# Patient Record
Sex: Female | Born: 1970 | Race: Black or African American | Hispanic: No | Marital: Single | Smoking: Former smoker
Health system: Southern US, Community
[De-identification: ages and names within clinical notes are randomized; demographics above are authoritative.]

## PROBLEM LIST (undated history)

## (undated) DIAGNOSIS — D219 Benign neoplasm of connective and other soft tissue, unspecified: Secondary | ICD-10-CM

## (undated) DIAGNOSIS — E66813 Obesity, class 3: Secondary | ICD-10-CM

## (undated) DIAGNOSIS — D649 Anemia, unspecified: Secondary | ICD-10-CM

## (undated) DIAGNOSIS — E785 Hyperlipidemia, unspecified: Secondary | ICD-10-CM

## (undated) DIAGNOSIS — K219 Gastro-esophageal reflux disease without esophagitis: Secondary | ICD-10-CM

## (undated) DIAGNOSIS — IMO0001 Reserved for inherently not codable concepts without codable children: Secondary | ICD-10-CM

## (undated) DIAGNOSIS — E119 Type 2 diabetes mellitus without complications: Secondary | ICD-10-CM

## (undated) DIAGNOSIS — J45909 Unspecified asthma, uncomplicated: Secondary | ICD-10-CM

## (undated) DIAGNOSIS — G4730 Sleep apnea, unspecified: Secondary | ICD-10-CM

## (undated) DIAGNOSIS — Z9289 Personal history of other medical treatment: Secondary | ICD-10-CM

## (undated) HISTORY — DX: Benign neoplasm of connective and other soft tissue, unspecified: D21.9

## (undated) HISTORY — DX: Morbid (severe) obesity due to excess calories: E66.01

## (undated) HISTORY — DX: Obesity, class 3: E66.813

## (undated) HISTORY — DX: Hyperlipidemia, unspecified: E78.5

## (undated) HISTORY — DX: Sleep apnea, unspecified: G47.30

## (undated) HISTORY — PX: OVARIAN CYST REMOVAL: SHX89

## (undated) HISTORY — PX: DILATION AND CURETTAGE OF UTERUS: SHX78

---

## 1998-11-12 ENCOUNTER — Emergency Department (HOSPITAL_COMMUNITY): Admission: EM | Admit: 1998-11-12 | Discharge: 1998-11-12 | Payer: Self-pay | Admitting: Emergency Medicine

## 1999-07-02 ENCOUNTER — Encounter: Payer: Self-pay | Admitting: Emergency Medicine

## 1999-07-02 ENCOUNTER — Emergency Department (HOSPITAL_COMMUNITY): Admission: EM | Admit: 1999-07-02 | Discharge: 1999-07-03 | Payer: Self-pay | Admitting: Emergency Medicine

## 1999-07-08 ENCOUNTER — Inpatient Hospital Stay (HOSPITAL_COMMUNITY): Admission: AD | Admit: 1999-07-08 | Discharge: 1999-07-08 | Payer: Self-pay | Admitting: Obstetrics & Gynecology

## 1999-07-08 ENCOUNTER — Encounter: Payer: Self-pay | Admitting: Obstetrics and Gynecology

## 1999-07-10 ENCOUNTER — Inpatient Hospital Stay (HOSPITAL_COMMUNITY): Admission: AD | Admit: 1999-07-10 | Discharge: 1999-07-13 | Payer: Self-pay | Admitting: Obstetrics and Gynecology

## 1999-08-06 ENCOUNTER — Other Ambulatory Visit: Admission: RE | Admit: 1999-08-06 | Discharge: 1999-08-06 | Payer: Self-pay | Admitting: Obstetrics and Gynecology

## 2000-11-23 ENCOUNTER — Emergency Department (HOSPITAL_COMMUNITY): Admission: EM | Admit: 2000-11-23 | Discharge: 2000-11-23 | Payer: Self-pay

## 2001-02-28 ENCOUNTER — Emergency Department (HOSPITAL_COMMUNITY): Admission: EM | Admit: 2001-02-28 | Discharge: 2001-02-28 | Payer: Self-pay | Admitting: Emergency Medicine

## 2005-04-03 ENCOUNTER — Other Ambulatory Visit: Admission: RE | Admit: 2005-04-03 | Discharge: 2005-04-03 | Payer: Self-pay | Admitting: Gynecology

## 2005-04-22 ENCOUNTER — Ambulatory Visit: Admission: RE | Admit: 2005-04-22 | Discharge: 2005-04-22 | Payer: Self-pay | Admitting: Gynecologic Oncology

## 2005-04-30 ENCOUNTER — Ambulatory Visit (HOSPITAL_COMMUNITY): Admission: RE | Admit: 2005-04-30 | Discharge: 2005-04-30 | Payer: Self-pay | Admitting: Gynecologic Oncology

## 2005-06-17 ENCOUNTER — Encounter (INDEPENDENT_AMBULATORY_CARE_PROVIDER_SITE_OTHER): Payer: Self-pay | Admitting: *Deleted

## 2005-06-17 ENCOUNTER — Ambulatory Visit (HOSPITAL_COMMUNITY): Admission: RE | Admit: 2005-06-17 | Discharge: 2005-06-17 | Payer: Self-pay | Admitting: Gynecologic Oncology

## 2005-06-17 ENCOUNTER — Encounter (INDEPENDENT_AMBULATORY_CARE_PROVIDER_SITE_OTHER): Payer: Self-pay | Admitting: Specialist

## 2005-07-08 ENCOUNTER — Ambulatory Visit: Admission: RE | Admit: 2005-07-08 | Discharge: 2005-07-08 | Payer: Self-pay | Admitting: Gynecologic Oncology

## 2009-08-08 ENCOUNTER — Other Ambulatory Visit: Admission: RE | Admit: 2009-08-08 | Discharge: 2009-08-08 | Payer: Self-pay | Admitting: Family Medicine

## 2010-07-19 NOTE — Op Note (Signed)
[facility]  Patient:    Rebecca Johnston, Rebecca Johnston                       MRN: [id number] Proc. Date: 07/11/99 Adm. Date:  [id number] Attending:  Jenean Lindau                           Operative Report  PREOPERATIVE DIAGNOSIS:       Rule out ectopic pregnancy.  POSTOPERATIVE DIAGNOSIS:      Left ectopic pregnancy.  OPERATION:                    Laparoscopy, exploratory laparotomy with left salpingectomy, and lysis of adhesions.  SURGEON:                      Almedia Balls. Fore, M.D.  ASSISTANT:  ANESTHESIA:                   General orotracheal.  ESTIMATED BLOOD LOSS:  INDICATIONS:                  The patient is a 40 year old with pelvic pain and  positive pregnancy test who presented with pain on the evening of May 9 and was  felt to have a ruptured ectopic.  She was fully counseled as to the nature of the procedure involved and the risks involved to include risks of anesthesia, injury to uterus, tubes, ovaries, bowel, bladder, blood vessels, ureters, postoperative hemorrhage, infection, possible removal of tube and ovary, and recuperation. She fully understands all of these considerations and wishes to proceed on Jul 10, 1999.  FINDINGS:                     On laparoscopy there was a large amount of clot present in the pelvis and it was noted that the left tube was quite enlarged. he right tube was adherent into the right posterolateral cul-de-sac area.  There appeared to be hemorrhage of the fimbriated end of the left tube.  The uterus had a large amount of organized clot and fibrin material over the fundus.  DESCRIPTION OF PROCEDURE:     With the patient under general anesthesia, prepped and draped in the usual sterile fashion, with an empty bladder postcatheterization, and a tenaculum and Acorn cannula on the cervix, an incision was made in the lower pole of the umbilicus.  The Veress cannula was inserted into the peritoneal cavity with  insufflation of 3 liters of carbon dioxide.  The reusable trocar for the operative scope and the scope itself were then inserted into the peritoneal cavity. Reusable 5 mm probe was inserted through a stab wound just above the symphysis pubis.  The above noted findings were visualized.  Copious amounts of irrigating solution were used in an attempt to lyse the clots in the pelvis.  However, this was unsuccessful.  Accordingly, it was felt that laparotomy was indicated.  A lower abdominal transverse incision was made and carried into the peritoneal cavity without difficulty.  A self retaining retractor was placed, and the bowel was packed off. Adhesions were lysed between the left tube and bowel, and the left tube and ovary, and the uterus.  The right tube was identified as being quite adherent into the  right posterolateral cul-de-sac area, but without signs of hemorrhage or dilation. The left tube was dilated throughout most of  its length.  The mesosalpinx was sequentially clamped using Heaney clamps, cut, and suture ligated with 0 Vicryl as this was removed.  Proximal portion of the tube was then clamped, cut, and doubly ligated with 0 Vicryl with removal of the majority of the left tube.  The left ovary had small areas of hemorrhage which were rendered hemostatic with interrupted sutures of 0 Vicryl.  The area was then lavaged with copious amounts of Ringers  lactate solution, and after noting that hemostasis was maintained and that sponge, needle, and instrument counts were correct, the peritoneum was closed with a continuous suture of 0 Vicryl.  The fascia layer was closed with two sutures of 0 Vicryl which were brought from the lateral aspects and tied separately in the midline.  Each layer was lavaged with copious amounts of Ringers lactate solution prior to full closure, and each layer was rendered hemostatic with Bovie electrocoagulation.  The skin was closed with skin  clips as was the laparoscopic incision.  Estimated blood loss was 250 ml from surgical standpoint and approximately another 250 to 500 ml of clot within the pelvis.  The patient was  taken to the recovery room in good condition with clear urine in the Foley catheter tubing.  She will be admitted following surgery. DD:  07/11/99 TD:  07/11/99 Job: [id number]

## 2010-07-19 NOTE — Discharge Summary (Signed)
[facility]  Patient:    TENSLEY, WERY                       MRN: [id number] Adm. Date:  [id number] Disc. Date: [id number] Attending:  Jenean Lindau                           Discharge Summary  DISCHARGE DIAGNOSES:          Ruptured ectopic pregnancy, left fallopian tube.  PROCEDURE:                    Diagnostic laparoscopy followed by laparotomy with left salpingectomy and lysis of adhesions.  COMPLICATIONS:                None.  TRANSFUSIONS:                 None.  HISTORY OF PRESENT ILLNESS:   Marcele Kosta is a 40 year old gravida 1, black female with irregular menses who initially presented to the [facility] emergency room with irregular bleeding.  The patient was initially evaluated by  ultrasound and quantitative beta hCG which initially revealed dropping titers from 653 to 593.  At ther subsequent follow-up her beta hCG had stabilized at 581 and this was felt to be consistent with an ectopic pregnancy.  There was no evidence of mass on ultrasound and a small amount of fluid in the pelvis.  Her hemoglobin was 10.4.  She was felt to be hemodynamically stable and a good candidate for medical management.  She was given a methotrexate injection on Jul 08, 1999, with plans o follow up three days later for a follow-up quantitative beta hCG.  However, she  presented on Jul 10, 1999, with acute worsening left lower quadrant pain.  She was felt at that point to have ruptured her pregnancy and was therefore taken to the operating room for evaluation.  HOSPITAL COURSE:              Almedia Balls. Fore, M.D. was covering for this physician for emergencies and took the patient to the operating room where he performed a  laparoscopy, but was unable to evacuate clots sufficiently to proceed with a laparoscopic procedure.  He subsequently performed an exploratory laparotomy with left salpingectomy and lysis of adhesions with  findings of clot adherent to the  left fallopian tube and in the cul-de-sac.  He also reported that the right tube was adherent into the right posterolateral cul-de-sac area, but had no signs of  hemorrhage or dilation.  Postoperatively, the patient did well.  She did have some vaginal bleeding as would be expected, but this was not excessive.  She had a maximum temperature elevation of 99.5 during her postoperative course.  She had return of bowel function and resumed a general diet.  She was ambulating independently, voiding without problems, passing gas, and had moved her bowels at the time of discharge on postoperative day #2.  Pathology confirmed the presence of a pregnancy involving the left fallopian tube.  During her hospital stay, the patient was seen by social work and initiated plans to receive Medicaid retroactive coverage for her pregnancy and related complications.  The patient was discharged home on postoperative day #2 in improved condition. She was given routine written and verbal instructions.  She was told to take Tylenol as opposed to Advil for minor degrees of pain due  to her recent methotrexate injection. She was given a prescription for Tylox dispense 20 one to two q.4-6h. p.r.n. pain with no refills.  Her blood type was O positive and she therefore did not require RhoGAM.  She was to follow up in the office the following Tuesday for staple removal and incision evaluation.  The incision appeared to be healing well at the time of discharge.  CONDITION ON DISCHARGE:       Improved. DD:  08/14/99 TD:  08/17/99 Job: [id number]

## 2010-07-19 NOTE — Op Note (Signed)
Rebecca Johnston, Rebecca Johnston                ACCOUNT NO.:  [id number]   MEDICAL RECORD NO.:  [id number]          PATIENT TYPE:  OUT   LOCATION:  GYN                          FACILITY:  [facility]   PHYSICIAN:  Rebecca A. Duard Brady, MD    DATE OF BIRTH:  12-Sep-1970   DATE OF PROCEDURE:  04/22/2005  DATE OF DISCHARGE:                                 OPERATIVE REPORT   The patient is seen, today, in consultation at the request of Dr. Chevis Pretty. Ms.  Johnston is a 40 year old gravida 1 para 0, abortus 1 who began experiencing  some right sided abdominal pain for the past 1-2 weeks. She was seen by Dr.  Chevis Pretty on February12 and apparently a palpable pelvic mass was present. An  ultrasound was performed of which I only have a written, brief note, in the  right ovary that revealed a 9.6 x 7.1 cm mass. A CA-125 was drawn that was  elevated at 63.3. She is also referred to [place] for evaluation. She does have a  history of an ectopic pregnancy in 2001 at which time she underwent a  laparoscopy, exploratory laparotomy with left salpingectomy and lysis of  adhesions in May2001 by Dr. Randell Patient.   The findings, at that time, revealed the right tube to be adherent into the  right posterior lateral cul-de-sac, but with no signs of hemorrhage or  dilatation. The left tube was dilated throughout most of its length.  She,  otherwise, has no complaints.  Her cycle was regular.  Her last cycle was at  the end of January. She denies any change in bowel or bladder habits.   PAST MEDICAL HISTORY:  None.   PAST SURGICAL HISTORY:  Ectopic pregnancy.   SOCIAL HISTORY:  She is single. She smokes 5-10 cigarettes per day; she has  done for 15 years. She denies the use of alcohol. She is a health care  Rebecca Johnston.   MEDICINES:  None.   ALLERGIES:  None.   FAMILY HISTORY:  Mother with hypertension, sister with diabetes, paternal  grandmother with cancer of some type, paternal grandfather with  hypertension, paternal grandmother with cancer of  unknown type, paternal  grandfather with cancer of unknown type.  Her father has diabetes.   PHYSICAL EXAMINATION:  VITAL SIGNS:  Height 5 feet 5 inches.  Weight 272  pounds. Pulse 88, respirations 18.  GENERAL:  A well-nourished, well-developed female in no acute distress.  NECK:  Supple. There is no lymphadenopathy. No thyromegaly.  LUNGS:  Clear to auscultation with distant breath sounds.  CARDIOVASCULAR EXAM:  Regular rate and rhythm.  ABDOMEN:  Morbidly obese, soft, nontender, nondistended. There are no  palpable masses or hepatosplenomegaly.  Exam is limited by habitus. She has  an infraumbilical incision and a transverse skin incision. Groins were  negative for adenopathy.  EXTREMITIES:  There is no edema.  PELVIC:  On bimanual examination in the posterior vagina, there is a mass  palpable that is more palpable on vaginal examination than on abdominal  secondary to obesity. On rectovaginal examination on the right side. I could  move it  out of the pelvis.   ASSESSMENT:  A 40 year old with a pelvic mass on ultrasound. We only have a  handwritten note regarding the results.  We also have an elevated CA-125.  This could all be consistent with endometriosis considering her prior  adhesive disease noted in 2001.   PLAN:  I discussed this issue with the patient.  I think identifying her  anatomy as well as can be expected would be essential as she already has the  loss of one tube, she had never had the opportunity to have children. We  will, therefore, proceed with a CT scan of the abdomen and pelvis. Once  these results are available I will contact the patient and will determine  her disposition.      Rebecca A. Duard Brady, MD  Electronically Signed     PAG/MEDQ  D:  04/22/2005  T:  04/23/2005  Job:  [id number]   cc:   Rebecca Johnston, R.N.  [address]  [place], [place] [address]   Rebecca Johnston. Mezer, M.D.  Fax: [phone]

## 2010-07-19 NOTE — Op Note (Signed)
Rebecca Johnston, CLIETT                ACCOUNT NO.:  [id number]   MEDICAL RECORD NO.:  [id number]          PATIENT TYPE:  AMB   LOCATION:  DAY                          FACILITY:  [facility]   PHYSICIAN:  Paola A. Duard Brady, MD    DATE OF BIRTH:  1970/10/03   DATE OF PROCEDURE:  06/17/2005  DATE OF DISCHARGE:                                 OPERATIVE REPORT   PREOPERATIVE DIAGNOSIS:  9 cm simple of right ovarian mass.   POSTOPERATIVE DIAGNOSIS:  Right-sided peritoneal inclusion cyst, right  hydrosalpinx.   PROCEDURE:  Diagnostic laparoscopy with cyst drainage.   SURGEON:  Paola A. Duard Brady, M.D.   ASSISTANT:  Roseanna [place], M.D.   ANESTHESIA:  General.   IV FLUIDS:  2700 mL.   URINE OUTPUT:  275 mL.   ESTIMATED BLOOD LOSS:  Minimal, less than 50 mL.   SPECIMENS:  Pelvic washings, cyst fluid.   SPECIMENS:  To pathology.   COMPLICATIONS:  None.   OPERATIVE FINDINGS:  Omentum adherent to the anterior abdominal wall status  post prior laparoscopy, the surgically absent left fallopian tube, a 4 cm  myoma fungal, a 9 cm right-sided peritoneal inclusion cyst densely adherent  to the ovary with hydrosalpinx of the right fallopian tube.  Approximately  150 mL of clear fluid were drained.   DESCRIPTION OF PROCEDURE:  The patient was taken to the operating room and  placed in supine position where anesthesia was induced.  She was then placed  in the dorsal lithotomy position with all appropriate precautions.  The arms  were then tucked to her side, again, with all appropriate precautions and  shoulder blocks were applied appropriately.  The abdomen was prepped in  usual sterile fashion.  The perineum was prepped in usual sterile fashion.  A Foley catheter was inserted in the bladder without difficulty.  The vagina  was cleansed.  A speculum was placed in the vagina.  The cervix is  nulliparous, it was clean, there were no visible lesions.  A single tooth  tenaculum was placed on the  anterior lip the cervix.  The cervix sounded to  9 cm and easily dilated.  The Humi was then placed into the cervix and  endocervical canal without any difficulty.   She was then draped in the usual sterile fashion.  Time-out was performed.  The patient was noted to have a large pannus with the umbilicus  approximately two fingerbreadth above the pubic symphysis.  Therefore, the  decision was made to proceed with a supraumbilical incision.  4% Marcaine  with epinephrine was injected.  A 2 cm horizontal incision was made 6-7 cm  above the umbilicus with the knife and carried down to the fascia using Mayo  scissors.  The fascia was identified, grasped with Kocher clamps, tented,  and entered sharply.  The fascial edges were secured with 0 Vicryl on a UR6.  The peritoneal cavity was then entered without difficulty.  The Hasson was  then placed, the abdomen was insufflated with CO2.  Never did the patient's  pressure exceed 50 mmHg.  The findings  as above were noted.  The patient was  placed in deep Trendelenburg position.  Attention was then given to placing  lateral ports.  Two 5 mm incisions were placed laterally approximately 10-15  cm away from the umbilicus in the same plane as the umbilicus.  Intraperitoneal placement was confirmed with lidocaine.  5 mm skin incision  was made with a knife and 5 mm trocars were placed under direct  visualization.  The omental adhesions were taken down using monopolar  cautery.  A 10/12 suprapubic port was then placed under her pannus.  The  area was infiltrated with lidocaine.  A 1.5 cm incision was made with a  knife and the 10/12 port was placed without difficulty.   Findings as above were noted.  An incision was made in what was initially  felt to be the ovarian capsule and fluid, as mentioned above, was drained  and aspirated.  Dissection of the capsule then revealed that we were  retroperitoneal, the retroperitoneal structures were identified.   The ovary  was densely adherent on the inferomedial side of this with the fallopian  tube draped over it and completely clubbed and obstructed, there were no  identifiable fimbria.  This was clearly a peritoneal inclusion cyst.  The  retroperitoneum was then dissected and the ureter was identified and noted  to be free from the area of dissection.  Indigo carmine was given with no  extravasation.  The area was irrigated.  It was noted to be hemostatic.  A 4  cm window was made within the peritoneal inclusion cyst to attempt  prevention of re-formation.  The ovary was left intact.  The area was  inspected under low flow and there was noted to be no bleeding.  The ports  were removed under direct visualization.  The trocars were all removed.  The  supraumbilical port fascia was closed using a running 0 Vicryl on a UR6.  The subcu tissue was noted be hemostatic and the skin was closed using 4-0  Vicryl with Steri-Strips.  The suprapubic port skin sites were closed using  4-0 Vicryl.  Steri-Strips and a dressing were placed.  Steri-Strips were  placed on the lateral dressings.  The patient tolerated procedure well and  was taken to the recovery room in stable condition.  All instrument, lap,  Ray-Tec, and needle counts were correct x2.  Specimens were sent to  pathology.      Paola A. Duard Brady, MD  Electronically Signed     PAG/MEDQ  D:  06/17/2005  T:  06/17/2005  Job:  [id number]   cc:   Roseanna Rainbow, M.D.  Fax: [phone]   Telford Nab, R.N.  [address]  [place], [place] [address]

## 2010-07-19 NOTE — H&P (Signed)
[place]  Patient:    Rebecca Johnston, Rebecca Johnston                       MRN: [id number] Adm. Date:  [id number] Attending:  Jenean Lindau                         History and Physical  CHIEF COMPLAINT:  Pain, positive pregnancy test.  HISTORY:  The patient is a 40 year old G0 whose last menstrual period was some ime in early April according to the patient.  She states she began having pain over the weekend of 07/02/99 and presented at [facility] emergency room, at which time  evaluation was performed, to include a positive pregnancy test and ultrasound which showed a large amount of echogenic fluid in the cul-de-sac and in the area of the right adnexa.  The patient was evaluated for possible ectopic pregnancy and allowed to return home.  On approximately 07/08/99, she was given an injection of methotrexate with the thought that because of plateauing HCG levels, she possibly had an ectopic pregnancy.  Her pain worsened over the afternoon of 07/10/99 and she presented n the emergency room at approximately 10:15 on the evening of 07/10/99. Evaluation at this time revealed the patient in moderate distress, complaining of pain in he left lower quadrant.  She is admitted for laparoscopy and possible laparotomy at this time for probable ectopic, which has ruptured.  PAST MEDICAL HISTORY:  She has no serious history of illness.  MEDICATIONS:  She is taking no medications.  ALLERGIES:  She is allergic to no medications.  REVIEW OF SYSTEMS:  HEENT within normal limits.  Cardiorespiratory: Negative. Gastrointestinal: Negative.  Genitourinary: As in Present Illness. Neuromuscular: Negative.  PHYSICAL EXAMINATION:  VITAL SIGNS:  Blood pressure 100/54, pulse 101, temperature 99.1, respirations 0.  GENERAL:  Well-developed, obese black female in moderate distress.  HEENT:  Within normal limits.  NECK:  Supple, without masses or  adenopathy.  LUNGS:  Clear to P&A.  HEART:  Regular rate and rhythm without murmurs.  BREASTS:  Exam while sitting/lying - without masses.  ABDOMEN:  Quite obese but very tender in both lower quadrants, right greater than left.  PELVIC:  External genitalia, Bartholins, urethral, Skenes glands within normal limits.  Cervix is high in the vagina and somewhat inflamed.  The uterus is difficult to palpate, but very tender on manipulation.  Adnexal masses cannot be palpated as well, but tenderness is greater on the right than the left.  EXTREMITIES:  Within normal limits.  CNS:  Grossly intact.  SKIN:  Without suspicious lesions.  IMPRESSION:  Pelvic pain, positive pregnancy test; rule out ectopic pregnancy.  DISPOSITION:  As noted above. DD:  07/11/99 TD:  07/11/99 Job: [id number]

## 2010-07-19 NOTE — Consult Note (Signed)
[name], [name]                ACCOUNT NO.:  [id number]   MEDICAL RECORD NO.:  [id number]          PATIENT TYPE:  OUT   LOCATION:  GYN                          FACILITY[facility]  [facility]   PHYSICIAN:  Paola A. Duard Brady, MD    DATE OF BIRTH:  19-Jun-1970   DATE OF CONSULTATION:  07/08/2005  DATE OF DISCHARGE:                                   CONSULTATION   Rebecca Johnston is a very pleasant 40 year old gravida 1, para 0, abortus 1 who  began experiencing some right-sided abdominal pain in February 2007.  She  was seen by Dr. Chevis Pretty and there was a palpable mass.  Ultrasound was  performed that revealed a 9.6 x 7.1 cm mass and she had an elevated CA-125  at 63.3.  She was subsequently referred to [place] for evaluation.  She did have  a history of an ectopic pregnancy in 2001 at which time she underwent  exploratory laparoscopy, laparotomy with left salpingectomy and extensive  lysis of adhesions.  A repeat ultrasound performed showed persistence of  this lesion.  Subsequently on April 1978 she underwent diagnostic  laparoscopy and cyst drainage.  Operative findings included omentum adherent  to the anterior abdominal wall status post a prior laparoscopy.  There is a  surgically absent left fallopian tube. She had a 4 cm small fundal myoma, a  9 cm right side peritoneal inclusion cyst that was densely adherent to the  right ovary with a hydrosalpinx of the right fallopian tube.  At the time of  surgery a window was made in what appeared to be a perineal inclusion cyst  and the cyst was drained. Peritoneal washings were negative and the ovarian  cyst fluid revealed abundant lymphocytes.  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  She comes in today for her postoperative check.  She is  overall doing quite well.  She continues have a small amount of  supraumbilical port site tenderness but otherwise doing well.  She has some  small amount of constipation.  She has not taken any stool softeners.  She  denies any nausea, vomiting, fevers, chills.  She tolerates regular diet and  is emptying her bowel and bladder habits fine.  The below back pain that she  was having has resolved.   PHYSICAL EXAMINATION:  GENERAL:  Weight 270 pounds, well-nourished alert  female in no acute distress.  ABDOMEN:  She has well healing surgical  incisions.  She has a scab over her supra umbilical port site, it is well-  healed.  There is no surrounding erythema. Abdomen is soft and nontender.   ASSESSMENT:  49.  40 year old status post cyst drainage and window creation for benign      disease.  She does have significant adhesive disease and a hydrosalpinx      on her right tube.  I did explain to her that should she decide to      proceed with conception she will most likely need in vitro fertilization  or at least some assistance by reproductive endocrinology she      understands this.  Also discussed with her that her ovaries looked      unremarkable but she had dense adhesive disease.  Her questions      regarding this were answered.  I do feel she can return to work on May      21.  2.  I have encouraged her to use Mederma or cocoa butter on her incision for      scar purposes.  3.  She will return to the care of Dr. Chevis Pretty.  She knows we will be happy to      see her in the future should the need arise.      Paola A. Duard Brady, MD  Electronically Signed     PAG/MEDQ  D:  07/08/2005  T:  07/09/2005  Job:  [id number]   cc:   Leatha Gilding. Mezer, M.D.  Fax: [phone]   Telford Nab, R.N.  [address]  [place], [place] [address]   Roseanna Rainbow, M.D.  Fax: [phone]

## 2010-08-06 ENCOUNTER — Other Ambulatory Visit: Payer: Self-pay | Admitting: Family Medicine

## 2010-08-06 DIAGNOSIS — M25572 Pain in left ankle and joints of left foot: Secondary | ICD-10-CM

## 2010-08-12 ENCOUNTER — Other Ambulatory Visit (HOSPITAL_COMMUNITY): Payer: Self-pay

## 2010-08-12 ENCOUNTER — Inpatient Hospital Stay (HOSPITAL_COMMUNITY): Admission: RE | Admit: 2010-08-12 | Payer: Self-pay | Source: Ambulatory Visit

## 2010-08-19 ENCOUNTER — Ambulatory Visit (HOSPITAL_COMMUNITY)
Admission: RE | Admit: 2010-08-19 | Discharge: 2010-08-19 | Disposition: A | Discharge status: Home / Self Care | Payer: No Typology Code available for payment source | Source: Ambulatory Visit | Attending: Family Medicine | Admitting: Family Medicine

## 2010-08-19 DIAGNOSIS — M899 Disorder of bone, unspecified: Secondary | ICD-10-CM | POA: Insufficient documentation

## 2010-08-19 DIAGNOSIS — M25579 Pain in unspecified ankle and joints of unspecified foot: Secondary | ICD-10-CM | POA: Insufficient documentation

## 2010-08-19 DIAGNOSIS — M25572 Pain in left ankle and joints of left foot: Secondary | ICD-10-CM

## 2011-12-15 ENCOUNTER — Other Ambulatory Visit (HOSPITAL_COMMUNITY): Payer: Self-pay | Admitting: Physician Assistant

## 2011-12-15 DIAGNOSIS — Z1231 Encounter for screening mammogram for malignant neoplasm of breast: Secondary | ICD-10-CM

## 2011-12-26 ENCOUNTER — Ambulatory Visit (HOSPITAL_COMMUNITY)
Admission: RE | Admit: 2011-12-26 | Discharge: 2011-12-26 | Disposition: A | Discharge status: Home / Self Care | Payer: Self-pay | Source: Ambulatory Visit | Attending: Physician Assistant | Admitting: Physician Assistant

## 2011-12-26 DIAGNOSIS — Z1231 Encounter for screening mammogram for malignant neoplasm of breast: Secondary | ICD-10-CM

## 2013-03-07 ENCOUNTER — Other Ambulatory Visit (HOSPITAL_COMMUNITY): Payer: Self-pay | Admitting: Physician Assistant

## 2013-03-30 ENCOUNTER — Other Ambulatory Visit: Payer: Self-pay | Admitting: Obstetrics and Gynecology

## 2013-03-30 DIAGNOSIS — Z1231 Encounter for screening mammogram for malignant neoplasm of breast: Secondary | ICD-10-CM

## 2013-04-03 DIAGNOSIS — Z9289 Personal history of other medical treatment: Secondary | ICD-10-CM

## 2013-04-03 HISTORY — DX: Personal history of other medical treatment: Z92.89

## 2013-04-18 ENCOUNTER — Emergency Department (HOSPITAL_COMMUNITY)
Admission: EM | Admit: 2013-04-18 | Discharge: 2013-04-18 | Disposition: A | Discharge status: Home / Self Care | Payer: Medicaid Other | Attending: Emergency Medicine | Admitting: Emergency Medicine

## 2013-04-18 ENCOUNTER — Emergency Department (HOSPITAL_COMMUNITY): Payer: Medicaid Other

## 2013-04-18 ENCOUNTER — Encounter (HOSPITAL_COMMUNITY): Payer: Self-pay | Admitting: Emergency Medicine

## 2013-04-18 DIAGNOSIS — F17200 Nicotine dependence, unspecified, uncomplicated: Secondary | ICD-10-CM | POA: Insufficient documentation

## 2013-04-18 DIAGNOSIS — R071 Chest pain on breathing: Secondary | ICD-10-CM | POA: Insufficient documentation

## 2013-04-18 DIAGNOSIS — D649 Anemia, unspecified: Secondary | ICD-10-CM

## 2013-04-18 DIAGNOSIS — R0602 Shortness of breath: Secondary | ICD-10-CM | POA: Insufficient documentation

## 2013-04-18 DIAGNOSIS — R0781 Pleurodynia: Secondary | ICD-10-CM

## 2013-04-18 DIAGNOSIS — N920 Excessive and frequent menstruation with regular cycle: Secondary | ICD-10-CM | POA: Insufficient documentation

## 2013-04-18 LAB — CBC WITH DIFFERENTIAL/PLATELET
BASOS ABS: 0 10*3/uL (ref 0.0–0.1)
Basophils Relative: 0 % (ref 0–1)
Eosinophils Absolute: 0.2 10*3/uL (ref 0.0–0.7)
Eosinophils Relative: 2 % (ref 0–5)
HCT: 25.7 % — ABNORMAL LOW (ref 36.0–46.0)
Hemoglobin: 7.7 g/dL — ABNORMAL LOW (ref 12.0–15.0)
LYMPHS ABS: 2.2 10*3/uL (ref 0.7–4.0)
LYMPHS PCT: 18 % (ref 12–46)
MCH: 16.3 pg — AB (ref 26.0–34.0)
MCHC: 30 g/dL (ref 30.0–36.0)
MCV: 54.6 fL — ABNORMAL LOW (ref 78.0–100.0)
MONO ABS: 1.2 10*3/uL — AB (ref 0.1–1.0)
Monocytes Relative: 10 % (ref 3–12)
Neutro Abs: 8.8 10*3/uL — ABNORMAL HIGH (ref 1.7–7.7)
Neutrophils Relative %: 70 % (ref 43–77)
PLATELETS: 552 10*3/uL — AB (ref 150–400)
RBC: 4.71 MIL/uL (ref 3.87–5.11)
RDW: 21 % — AB (ref 11.5–15.5)
WBC: 12.4 10*3/uL — AB (ref 4.0–10.5)

## 2013-04-18 LAB — COMPREHENSIVE METABOLIC PANEL
ALK PHOS: 141 U/L — AB (ref 39–117)
ALT: 14 U/L (ref 0–35)
AST: 27 U/L (ref 0–37)
Albumin: 3.2 g/dL — ABNORMAL LOW (ref 3.5–5.2)
BUN: 8 mg/dL (ref 6–23)
CO2: 23 mEq/L (ref 19–32)
Calcium: 9.5 mg/dL (ref 8.4–10.5)
Chloride: 101 mEq/L (ref 96–112)
Creatinine, Ser: 0.87 mg/dL (ref 0.50–1.10)
GFR, EST NON AFRICAN AMERICAN: 80 mL/min — AB (ref >90)
GLUCOSE: 107 mg/dL — AB (ref 70–99)
Potassium: 4.4 mEq/L (ref 3.7–5.3)
Sodium: 138 mEq/L (ref 137–147)
Total Bilirubin: 0.2 mg/dL — ABNORMAL LOW (ref 0.3–1.2)
Total Protein: 7.8 g/dL (ref 6.0–8.3)

## 2013-04-18 LAB — HCG, SERUM, QUALITATIVE: PREG SERUM: NEGATIVE

## 2013-04-18 LAB — VITAMIN B12: Vitamin B-12: 516 pg/mL (ref 211–911)

## 2013-04-18 LAB — RETICULOCYTES
RBC.: 4.74 MIL/uL (ref 3.87–5.11)
RETIC COUNT ABSOLUTE: 90.1 10*3/uL (ref 19.0–186.0)
RETIC CT PCT: 1.9 % (ref 0.4–3.1)

## 2013-04-18 LAB — IRON AND TIBC
Iron: 16 ug/dL — ABNORMAL LOW (ref 42–135)
Saturation Ratios: 4 % — ABNORMAL LOW (ref 20–55)
TIBC: 443 ug/dL (ref 250–470)
UIBC: 427 ug/dL — AB (ref 125–400)

## 2013-04-18 LAB — FERRITIN: Ferritin: 6 ng/mL — ABNORMAL LOW (ref 10–291)

## 2013-04-18 LAB — PREPARE RBC (CROSSMATCH)

## 2013-04-18 LAB — TROPONIN I

## 2013-04-18 LAB — D-DIMER, QUANTITATIVE (NOT AT ARMC): D DIMER QUANT: 1.83 ug{FEU}/mL — AB (ref 0.00–0.48)

## 2013-04-18 LAB — FOLATE: Folate: 6.4 ng/mL

## 2013-04-18 MED ORDER — OMEPRAZOLE 20 MG PO CPDR: 20.0000 mg | DELAYED_RELEASE_CAPSULE | Freq: Every day | ORAL | Status: DC

## 2013-04-18 MED ORDER — DOCUSATE SODIUM 100 MG PO CAPS: 100.0000 mg | ORAL_CAPSULE | Freq: Two times a day (BID) | ORAL | Status: DC

## 2013-04-18 MED ORDER — FERROUS SULFATE 325 (65 FE) MG PO TABS: 325.0000 mg | ORAL_TABLET | Freq: Every day | ORAL | Status: DC

## 2013-04-18 MED ORDER — KETOROLAC TROMETHAMINE 15 MG/ML IJ SOLN
30.0000 mg | Freq: Once | INTRAMUSCULAR | Status: AC
  Administered 2013-04-18: 30 mg via INTRAVENOUS
  Filled 2013-04-18: qty 1

## 2013-04-18 MED ORDER — IOHEXOL 350 MG/ML SOLN
100.0000 mL | Freq: Once | INTRAVENOUS | Status: AC | PRN
  Administered 2013-04-18: 100 mL via INTRAVENOUS

## 2013-04-18 NOTE — Progress Notes (Signed)
Pt states she previously seen at [facility] physicians on [address] States she recently obtained her new insurance coverage and will get the time to review her list of in network providers soon and will probably choose a new provider later after ED visit. Cm attempted to answer pt's questions about pcp Pt refused offer for Cm to assist with a list of in network pcps for [place].

## 2013-04-18 NOTE — Discharge Instructions (Signed)
Anemia, Nonspecific Anemia is a condition in which the concentration of red blood cells or hemoglobin in the blood is below normal. Hemoglobin is a substance in red blood cells that carries oxygen to the tissues of the body. Anemia results in not enough oxygen reaching these tissues.  CAUSES  Common causes of anemia include:   Excessive bleeding. Bleeding may be internal or external. This includes excessive bleeding from periods (in women) or from the intestine.   Poor nutrition.   Chronic kidney, thyroid, and liver disease.  Bone marrow disorders that decrease red blood cell production.  Cancer and treatments for cancer.  HIV, AIDS, and their treatments.  Spleen problems that increase red blood cell destruction.  Blood disorders.  Excess destruction of red blood cells due to infection, medicines, and autoimmune disorders. SIGNS AND SYMPTOMS   Minor weakness.   Dizziness.   Headache.  Palpitations.   Shortness of breath, especially with exercise.   Paleness.  Cold sensitivity.  Indigestion.  Nausea.  Difficulty sleeping.  Difficulty concentrating. Symptoms may occur suddenly or they may develop slowly.  DIAGNOSIS  Additional blood tests are often needed. These help your health care provider determine the best treatment. Your health care provider will check your stool for blood and look for other causes of blood loss.  TREATMENT  Treatment varies depending on the cause of the anemia. Treatment can include:   Supplements of iron, vitamin P53, or folic acid.   Hormone medicines.   A blood transfusion. This may be needed if blood loss is severe.   Hospitalization. This may be needed if there is significant continual blood loss.   Dietary changes.  Spleen removal. HOME CARE INSTRUCTIONS Keep all follow-up appointments. It often takes many weeks to correct anemia, and having your health care provider check on your condition and your response to  treatment is very important. SEEK IMMEDIATE MEDICAL CARE IF:   You develop extreme weakness, shortness of breath, or chest pain.   You become dizzy or have trouble concentrating.  You develop heavy vaginal bleeding.   You develop a rash.   You have bloody or black, tarry stools.   You faint.   You vomit up blood.   You vomit repeatedly.   You have abdominal pain.  You have a fever or persistent symptoms for more than 2 3 days.   You have a fever and your symptoms suddenly get worse.   You are dehydrated.  MAKE SURE YOU:  Understand these instructions.  Will watch your condition.  Will get help right away if you are not doing well or get worse. Document Released: 03/27/2004 Document Revised: 10/20/2012 Document Reviewed: 08/13/2012 [facility] Patient Information 2014 [place].  Chest Pain (Nonspecific) It is often hard to give a specific diagnosis for the cause of chest pain. There is always a chance that your pain could be related to something serious, such as a heart attack or a blood clot in the lungs. You need to follow up with your caregiver for further evaluation. CAUSES   Heartburn.  Pneumonia or bronchitis.  Anxiety or stress.  Inflammation around your heart (pericarditis) or lung (pleuritis or pleurisy).  A blood clot in the lung.  A collapsed lung (pneumothorax). It can develop suddenly on its own (spontaneous pneumothorax) or from injury (trauma) to the chest.  Shingles infection (herpes zoster virus). The chest wall is composed of bones, muscles, and cartilage. Any of these can be the source of the pain.  The bones can  be bruised by injury.  The muscles or cartilage can be strained by coughing or overwork.  The cartilage can be affected by inflammation and become sore (costochondritis). DIAGNOSIS  Lab tests or other studies, such as X-rays, electrocardiography, stress testing, or cardiac imaging, may be needed to find the cause of  your pain.  TREATMENT   Treatment depends on what may be causing your chest pain. Treatment may include:  Acid blockers for heartburn.  Anti-inflammatory medicine.  Pain medicine for inflammatory conditions.  Antibiotics if an infection is present.  You may be advised to change lifestyle habits. This includes stopping smoking and avoiding alcohol, caffeine, and chocolate.  You may be advised to keep your head raised (elevated) when sleeping. This reduces the chance of acid going backward from your stomach into your esophagus.  Most of the time, nonspecific chest pain will improve within 2 to 3 days with rest and mild pain medicine. HOME CARE INSTRUCTIONS   If antibiotics were prescribed, take your antibiotics as directed. Finish them even if you start to feel better.  For the next few days, avoid physical activities that bring on chest pain. Continue physical activities as directed.  Do not smoke.  Avoid drinking alcohol.  Only take over-the-counter or prescription medicine for pain, discomfort, or fever as directed by your caregiver.  Follow your caregiver's suggestions for further testing if your chest pain does not go away.  Keep any follow-up appointments you made. If you do not go to an appointment, you could develop lasting (chronic) problems with pain. If there is any problem keeping an appointment, you must call to reschedule. SEEK MEDICAL CARE IF:   You think you are having problems from the medicine you are taking. Read your medicine instructions carefully.  Your chest pain does not go away, even after treatment.  You develop a rash with blisters on your chest. SEEK IMMEDIATE MEDICAL CARE IF:   You have increased chest pain or pain that spreads to your arm, neck, jaw, back, or abdomen.  You develop shortness of breath, an increasing cough, or you are coughing up blood.  You have severe back or abdominal pain, feel nauseous, or vomit.  You develop severe  weakness, fainting, or chills.  You have a fever. THIS IS AN EMERGENCY. Do not wait to see if the pain will go away. Get medical help at once. Call your local emergency services (911 in [place] Do not drive yourself to the hospital. MAKE SURE YOU:   Understand these instructions.  Will watch your condition.  Will get help right away if you are not doing well or get worse. Document Released: 11/27/2004 Document Revised: 05/12/2011 Document Reviewed: 09/23/2007 [facility] Patient Information 2014 [place].

## 2013-04-18 NOTE — ED Provider Notes (Addendum)
Spoke with Dr Benay Spice, heme onc, who agrees with workup. Suspects this is more related to iron deficiency anemia.  He feels as though the primary care physician can work up will refer to hematology as necessary.  He recommends that the patient has difficulty getting in to the primary care physician that his team will be happy to see her in the heme onc clinic. Anemia panel now. Transfusion now. Dc home after transfusion  1:47 PM Patient continues to feel good.  Discharge home after blood transfusion.  Patient states she does not have a primary care physician at this time.  We a long discussion regarding primary care physicians and she understands the importance of followup.  She has health insurance that she just received through the [employer] healthcare act.  She states this was started several weeks.  She will develop a relationship with her PCP.  The meantime she'll followup with the hematologist as necessary but it seems like this is likely iron deficiency anemia.  Patient be placed on iron supplementation.  Her chest pain is nonspecific.  She'll be placed on Prilosec.  Doubt ACS.  No evidence of PE.  She understands the importance of close followup and repeat imaging of her chest to evaluate her pulmonary nodules.  Results for orders placed during the hospital encounter of 04/18/13  CBC WITH DIFFERENTIAL      Result Value Ref Range   WBC 12.4 (*) 4.0 - 10.5 K/uL   RBC 4.71  3.87 - 5.11 MIL/uL   Hemoglobin 7.7 (*) 12.0 - 15.0 g/dL   HCT 25.7 (*) 36.0 - 46.0 %   MCV 54.6 (*) 78.0 - 100.0 fL   MCH 16.3 (*) 26.0 - 34.0 pg   MCHC 30.0  30.0 - 36.0 g/dL   RDW 21.0 (*) 11.5 - 15.5 %   Platelets 552 (*) 150 - 400 K/uL   Neutrophils Relative % 70  43 - 77 %   Lymphocytes Relative 18  12 - 46 %   Monocytes Relative 10  3 - 12 %   Eosinophils Relative 2  0 - 5 %   Basophils Relative 0  0 - 1 %   Neutro Abs 8.8 (*) 1.7 - 7.7 K/uL   Lymphs Abs 2.2  0.7 - 4.0 K/uL   Monocytes Absolute 1.2 (*) 0.1 -  1.0 K/uL   Eosinophils Absolute 0.2  0.0 - 0.7 K/uL   Basophils Absolute 0.0  0.0 - 0.1 K/uL   RBC Morphology POLYCHROMASIA PRESENT    COMPREHENSIVE METABOLIC PANEL      Result Value Ref Range   Sodium 138  137 - 147 mEq/L   Potassium 4.4  3.7 - 5.3 mEq/L   Chloride 101  96 - 112 mEq/L   CO2 23  19 - 32 mEq/L   Glucose, Bld 107 (*) 70 - 99 mg/dL   BUN 8  6 - 23 mg/dL   Creatinine, Ser 0.87  0.50 - 1.10 mg/dL   Calcium 9.5  8.4 - 10.5 mg/dL   Total Protein 7.8  6.0 - 8.3 g/dL   Albumin 3.2 (*) 3.5 - 5.2 g/dL   AST 27  0 - 37 U/L   ALT 14  0 - 35 U/L   Alkaline Phosphatase 141 (*) 39 - 117 U/L   Total Bilirubin 0.2 (*) 0.3 - 1.2 mg/dL   GFR calc non Af Amer 80 (*) >90 mL/min   GFR calc Af Amer >90  >90 mL/min  TROPONIN I  Result Value Ref Range   Troponin I <0.30  <0.30 ng/mL  D-DIMER, QUANTITATIVE      Result Value Ref Range   D-Dimer, Quant 1.83 (*) 0.00 - 0.48 ug/mL-FEU  HCG, SERUM, QUALITATIVE      Result Value Ref Range   Preg, Serum NEGATIVE  NEGATIVE  VITAMIN B12      Result Value Ref Range   Vitamin B-12 516  211 - 911 pg/mL  FOLATE      Result Value Ref Range   Folate 6.4    IRON AND TIBC      Result Value Ref Range   Iron 16 (*) 42 - 135 ug/dL   TIBC 443  250 - 470 ug/dL   Saturation Ratios 4 (*) 20 - 55 %   UIBC 427 (*) 125 - 400 ug/dL  FERRITIN      Result Value Ref Range   Ferritin 6 (*) 10 - 291 ng/mL  RETICULOCYTES      Result Value Ref Range   Retic Ct Pct 1.9  0.4 - 3.1 %   RBC. 4.74  3.87 - 5.11 MIL/uL   Retic Count, Manual 90.1  19.0 - 186.0 K/uL  PREPARE RBC (CROSSMATCH)      Result Value Ref Range   Order Confirmation ORDER PROCESSED BY BLOOD BANK    TYPE AND SCREEN      Result Value Ref Range   ABO/RH(D) O POS     Antibody Screen NEG     Sample Expiration 04/21/2013     Unit Number [id number]     Blood Component Type RBC, LR IRR     Unit division 00     Status of Unit ISSUED     Transfusion Status OK TO TRANSFUSE      Crossmatch Result Compatible     Unit Number [id number]     Blood Component Type RED CELLS,LR     Unit division 00     Status of Unit ISSUED     Transfusion Status OK TO TRANSFUSE     Crossmatch Result Compatible      Hoy Morn, MD 04/18/13 [place], MD 04/18/13 1348

## 2013-04-18 NOTE — ED Provider Notes (Signed)
CSN: [id number]     Arrival date & time 04/18/13  0501 History   First MD Initiated Contact with Patient 04/18/13 0505     Chief Complaint  Patient presents with  . Chest Pain     (Consider location/radiation/quality/duration/timing/severity/associated sxs/prior Treatment) HPI Patient presents with 2 days of central and right lower chest pain. Patient states the pain is sharp in nature and worse with inspiration and expiration. The pain waxes and wanes though the pain has not dissipated since its onset. She has no cough or fever. She has mild shortness of breath associated with deep inspiration. Chest no lower extremity swelling or pain. Patient denies any extended travel or surgeries. She's had no sick contacts. She denies abdominal pain or nausea. History reviewed. No pertinent past medical history. History reviewed. No pertinent past surgical history. No family history on file. History  Substance Use Topics  . Smoking status: Never Smoker   . Smokeless tobacco: Not on file  . Alcohol Use: No   OB History   Grav Para Term Preterm Abortions TAB SAB Ect Mult Living                 Review of Systems  Constitutional: Negative for fever and chills.  HENT: Negative for congestion, rhinorrhea and sinus pressure.   Respiratory: Positive for shortness of breath. Negative for cough and wheezing.   Cardiovascular: Positive for chest pain.  Gastrointestinal: Negative for nausea, vomiting, abdominal pain and diarrhea.  Musculoskeletal: Negative for back pain, myalgias, neck pain and neck stiffness.  Skin: Negative for rash and wound.  Neurological: Negative for dizziness, weakness, light-headedness, numbness and headaches.  All other systems reviewed and are negative.      Allergies  Review of patient's allergies indicates no known allergies.  Home Medications  No current outpatient prescriptions on file. BP 110/50  Pulse 85  Temp(Src) 98.1 F (36.7 C) (Oral)  Resp 26  SpO2  100%  LMP 03/10/2013 Physical Exam  Nursing note and vitals reviewed. Constitutional: She is oriented to person, place, and time. She appears well-developed and well-nourished. No distress.  HENT:  Head: Normocephalic and atraumatic.  Mouth/Throat: Oropharynx is clear and moist.  Eyes: EOM are normal. Pupils are equal, round, and reactive to light.  Neck: Normal range of motion. Neck supple.  Cardiovascular: Normal rate and regular rhythm.   Pulmonary/Chest: Effort normal and breath sounds normal. No respiratory distress. She has no wheezes. She has no rales. She exhibits no tenderness (no chest tenderness to palpation. No crepitance.).  Abdominal: Soft. Bowel sounds are normal. She exhibits no distension and no mass. There is no tenderness. There is no rebound and no guarding.  Musculoskeletal: Normal range of motion. She exhibits no edema and no tenderness.  No calf swelling or tenderness.  Neurological: She is alert and oriented to person, place, and time.  Patient is alert and oriented x3 with clear, goal oriented speech. Patient has 5/5 motor in all extremities. Sensation is intact to light touch. Patient has a normal gait and walks without assistance.   Skin: Skin is warm and dry. No rash noted. No erythema.  Psychiatric: She has a normal mood and affect. Her behavior is normal.    ED Course  Procedures (including critical care time) Labs Review Labs Reviewed  CBC WITH DIFFERENTIAL - Abnormal; Notable for the following:    WBC 12.4 (*)    Hemoglobin 7.7 (*)    HCT 25.7 (*)    MCV 54.6 (*)  MCH 16.3 (*)    RDW 21.0 (*)    Platelets 552 (*)    Neutro Abs 8.8 (*)    Monocytes Absolute 1.2 (*)    All other components within normal limits  COMPREHENSIVE METABOLIC PANEL - Abnormal; Notable for the following:    Glucose, Bld 107 (*)    Albumin 3.2 (*)    Alkaline Phosphatase 141 (*)    Total Bilirubin 0.2 (*)    GFR calc non Af Amer 80 (*)    All other components within  normal limits  D-DIMER, QUANTITATIVE - Abnormal; Notable for the following:    D-Dimer, Quant 1.83 (*)    All other components within normal limits  TROPONIN I  HCG, SERUM, QUALITATIVE  PREPARE RBC (CROSSMATCH)   Imaging Review Dg Chest 2 View  04/18/2013   CLINICAL DATA:  Chest pain  EXAM: CHEST  2 VIEW  COMPARISON:  None currently available  FINDINGS: Generous heart size. No acute mediastinal contour change. No edema, consolidation, effusion, or pneumothorax. No acute osseous findings.  IMPRESSION: No active cardiopulmonary disease.   Electronically Signed   By: Jorje Guild M.D.   On: 04/18/2013 07:29   Ct Angio Chest Pe W/cm &/or Wo Cm  04/18/2013   CLINICAL DATA:  Mid sternal chest pain, elevated D-dimer  EXAM: CT ANGIOGRAPHY CHEST WITH CONTRAST  TECHNIQUE: Multidetector CT imaging of the chest was performed using the standard protocol during bolus administration of intravenous contrast. Multiplanar CT image reconstructions and MIPs were obtained to evaluate the vascular anatomy.  CONTRAST:  134mL OMNIPAQUE IOHEXOL 350 MG/ML SOLN  COMPARISON:  None.  FINDINGS: No filling defects in the pulmonary arterial system, although detail is mildly limited by patient body habitus particularly involving the more peripheral pulmonary arteries. Thoracic aorta shows no dissection or dilatation. In the retrosternal area there is some ill-defined soft tissue which may represent residual thymus. There is a left suprahilar lymph node measuring 10 mm in short axis. There is an 8 mm lymph node in the anterior mediastinum adjacent to the root of the aorta. There is a 7 mm right paratracheal lymph node. There is an 11 mm right hilar lymph node.  There is no infiltrate, consolidation, or effusion. On image number 24 series 6, there is a 4 mm pulmonary nodule superior segment left lower lobe. On image 29 there is a 6 mm left pulmonary nodule. On image number 33 there is a 8 mm left lower lobe pulmonary nodule. There is  a 5 mm left lower lobe pulmonary nodule on image number 43. There is a 5 mm pulmonary nodule in the right lower lobe on image number 54.  There are no acute musculoskeletal findings. The upper abdomen is essentially excluded from the field-of-view except for the domes of the liver and spleen which appear negative.  Review of the MIP images confirms the above findings.  IMPRESSION: 1. Study limited by body habitus with no pulmonary embolism detected. 2. Multiple borderline and a few mildly enlarged nonspecific mediastinal and hilar lymph nodes. Multiple pulmonary nodules bilaterally the largest measuring 8 mm. If the patient is at high risk for bronchogenic carcinoma, follow-up chest CT at 3-21months is recommended. If the patient is at low risk for bronchogenic carcinoma, follow-up chest CT at 6-12 months is recommended. This recommendation follows the consensus statement: Guidelines for Management of Small Pulmonary Nodules Detected on CT Scans: A Statement from the [place] as published in Radiology 2005; 237:395-400.   Electronically Signed  By: Skipper Cliche M.D.   On: 04/18/2013 07:22    EKG Interpretation    Date/Time:  Monday April 18 2013 05:13:28 EST Ventricular Rate:  97 PR Interval:  124 QRS Duration: 77 QT Interval:  360 QTC Calculation: 457 R Axis:   59 Text Interpretation:  Sinus rhythm Confirmed by Kasai Beltran  MD, Avenir Lozinski ([id number]) on 04/18/2013 5:34:02 AM            MDM   Final diagnoses:  Pleuritic chest pain      Patient states that she's been told that she is anemic in the past but never required blood transfusion. She's had increasing shortness of breath especially with walking up stairs at the last few weeks. She reports heavy irregular periods. She denies any gastrointestinal bleeding or melena. Her chest pain is improved in the emergency department. Her vital signs remained stable. Patient does admit to smoking half-pack a day. Her CT scan shows multiple  nodules in the lungs. Had a long discussion with patient regarding this finding. Have encouraged her to stop smoking. Patient has primary care Dr. and she's been advised she is to followup with her primary care doctor in 3-6 months for repeat CT scan. Will transfuse this patient in emergency department given her symptomatic anemia and have her followup with her primary. Return precautions have been given and the patient's voice understanding. I have low suspicion for coronary artery disease in the pleuritic nature of the pain. The patient has a normal EKG and has had pain that is constant for the past 2 days with a normal troponin. Will sign out to oncoming emergency physician pending completion of blood transfusion. The patient can be safely discharged home.  Julianne Rice, MD 04/18/13 [phone]

## 2013-04-18 NOTE — ED Notes (Signed)
Second unit blood verified by Kerin Perna, RN at [id number] and ended at 1420. Patient's bracelet and blood was scanned. At the beginning.

## 2013-04-18 NOTE — ED Notes (Signed)
Pt denies itching, discomfort, or shortness of breath/ difficulty breathing.

## 2013-04-18 NOTE — ED Notes (Signed)
Pt arrived to the ED with a complaint of chest pain.  Pain is located mid sternum.  Pt states's she has no radiation with pain.  Pt states that the pain increases with her exhalations.  Pt states she has no cough, congestion, headache or nausea and emesis with the chest pain.  Pt is ambulatory and able to communicate symptoms while walking.

## 2013-04-19 LAB — TYPE AND SCREEN
ABO/RH(D): O POS
Antibody Screen: NEGATIVE
UNIT DIVISION: 0
Unit division: 0

## 2013-04-27 ENCOUNTER — Encounter (HOSPITAL_COMMUNITY): Payer: Self-pay | Admitting: Emergency Medicine

## 2013-04-27 ENCOUNTER — Emergency Department (HOSPITAL_COMMUNITY)
Admission: EM | Admit: 2013-04-27 | Discharge: 2013-04-27 | Disposition: A | Discharge status: Home / Self Care | Payer: Medicaid Other | Attending: Emergency Medicine | Admitting: Emergency Medicine

## 2013-04-27 ENCOUNTER — Emergency Department (HOSPITAL_COMMUNITY): Payer: Medicaid Other

## 2013-04-27 DIAGNOSIS — E669 Obesity, unspecified: Secondary | ICD-10-CM | POA: Insufficient documentation

## 2013-04-27 DIAGNOSIS — F17200 Nicotine dependence, unspecified, uncomplicated: Secondary | ICD-10-CM | POA: Insufficient documentation

## 2013-04-27 DIAGNOSIS — R0789 Other chest pain: Secondary | ICD-10-CM | POA: Insufficient documentation

## 2013-04-27 DIAGNOSIS — Z79899 Other long term (current) drug therapy: Secondary | ICD-10-CM | POA: Insufficient documentation

## 2013-04-27 LAB — I-STAT TROPONIN, ED: Troponin i, poc: 0 ng/mL (ref 0.00–0.08)

## 2013-04-27 LAB — PRO B NATRIURETIC PEPTIDE: Pro B Natriuretic peptide (BNP): 12.1 pg/mL (ref 0–125)

## 2013-04-27 LAB — BASIC METABOLIC PANEL
BUN: 10 mg/dL (ref 6–23)
CALCIUM: 9.7 mg/dL (ref 8.4–10.5)
CO2: 24 mEq/L (ref 19–32)
Chloride: 97 mEq/L (ref 96–112)
Creatinine, Ser: 0.9 mg/dL (ref 0.50–1.10)
GFR calc Af Amer: 89 mL/min — ABNORMAL LOW (ref >90)
GFR, EST NON AFRICAN AMERICAN: 77 mL/min — AB (ref >90)
Glucose, Bld: 139 mg/dL — ABNORMAL HIGH (ref 70–99)
Potassium: 4 mEq/L (ref 3.7–5.3)
Sodium: 135 mEq/L — ABNORMAL LOW (ref 137–147)

## 2013-04-27 LAB — CBC WITH DIFFERENTIAL/PLATELET
BASOS ABS: 0 10*3/uL (ref 0.0–0.1)
BASOS PCT: 0 % (ref 0–1)
EOS ABS: 0.2 10*3/uL (ref 0.0–0.7)
Eosinophils Relative: 2 % (ref 0–5)
HEMATOCRIT: 33.2 % — AB (ref 36.0–46.0)
HEMOGLOBIN: 10.1 g/dL — AB (ref 12.0–15.0)
LYMPHS ABS: 1.6 10*3/uL (ref 0.7–4.0)
Lymphocytes Relative: 17 % (ref 12–46)
MCH: 18.2 pg — AB (ref 26.0–34.0)
MCHC: 30.4 g/dL (ref 30.0–36.0)
MCV: 59.8 fL — AB (ref 78.0–100.0)
MONO ABS: 0.5 10*3/uL (ref 0.1–1.0)
Monocytes Relative: 5 % (ref 3–12)
Neutro Abs: 7.3 10*3/uL (ref 1.7–7.7)
Neutrophils Relative %: 76 % (ref 43–77)
Platelets: 476 10*3/uL — ABNORMAL HIGH (ref 150–400)
RBC: 5.55 MIL/uL — ABNORMAL HIGH (ref 3.87–5.11)
RDW: 27.1 % — AB (ref 11.5–15.5)
WBC: 9.6 10*3/uL (ref 4.0–10.5)

## 2013-04-27 MED ORDER — KETOROLAC TROMETHAMINE 60 MG/2ML IM SOLN
60.0000 mg | Freq: Once | INTRAMUSCULAR | Status: AC
  Administered 2013-04-27: 60 mg via INTRAMUSCULAR
  Filled 2013-04-27: qty 2

## 2013-04-27 NOTE — ED Notes (Signed)
Pt was seen here on 2/16 for chest pain.  Pt was admitted to hospital for anemia and given blood transfusion.  Pt states she has had a menstrual cycle and would like her blood levels rechecked.  Pt then goes on to explain that she is having the same chest pain as before.

## 2013-04-27 NOTE — ED Notes (Signed)
She also mentions having "heavy periods; and I'm on it now".  She states "The doctor may need to refer me to see someone about fibroids and birth control and that sort of thing".

## 2013-04-27 NOTE — ED Notes (Signed)
She states she was here a short time ago and was found to be anemic and at that time received two units of prbc's. She is in no distress and c/o left thoracic area pain which she theorizes is pleurisy.  She also states on her previous visit here "the doctor gave me something through my IV for "inflammation in my chest".

## 2013-04-27 NOTE — ED Provider Notes (Signed)
CSN: [id number]     Arrival date & time 04/27/13  1106 History   First MD Initiated Contact with Patient 04/27/13 1452     Chief Complaint  Patient presents with  . Pleurisy     (Consider location/radiation/quality/duration/timing/severity/associated sxs/prior Treatment) Patient is a 43 y.o. female presenting with chest pain. The history is provided by the patient. No language interpreter was used.  Chest Pain Pain location:  L chest Pain quality: sharp   Radiates to: to L lateral chest. Pain radiates to the back: no   Pain severity:  Moderate Duration:  2 days Timing:  Intermittent Progression:  Waxing and waning Chronicity:  Recurrent Context: breathing   Relieved by:  Nothing Worsened by:  Deep breathing Ineffective treatments:  None tried Associated symptoms: no abdominal pain, no back pain, no claudication, no cough, no diaphoresis, no fatigue, no fever, no headache, no nausea, no numbness, no palpitations, no shortness of breath, no syncope, not vomiting and no weakness   Risk factors: obesity and smoking   Risk factors: no aortic disease, no birth control, no coronary artery disease, no Ehlers-Danlos syndrome, no high cholesterol, no hypertension, no immobilization, not female, no Marfan's syndrome, not pregnant, no prior DVT/PE and no surgery     History reviewed. No pertinent past medical history. History reviewed. No pertinent past surgical history. History reviewed. No pertinent family history. History  Substance Use Topics  . Smoking status: Never Smoker   . Smokeless tobacco: Not on file  . Alcohol Use: No   OB History   Grav Para Term Preterm Abortions TAB SAB Ect Mult Living                 Review of Systems  Constitutional: Negative for fever, chills, diaphoresis, activity change, appetite change and fatigue.  HENT: Negative for congestion, facial swelling, rhinorrhea and sore throat.   Eyes: Negative for photophobia and discharge.  Respiratory: Negative  for cough, chest tightness and shortness of breath.   Cardiovascular: Positive for chest pain. Negative for palpitations, claudication, leg swelling and syncope.  Gastrointestinal: Negative for nausea, vomiting, abdominal pain and diarrhea.  Endocrine: Negative for polydipsia and polyuria.  Genitourinary: Negative for dysuria, frequency, difficulty urinating and pelvic pain.  Musculoskeletal: Negative for arthralgias, back pain, neck pain and neck stiffness.  Skin: Negative for color change and wound.  Allergic/Immunologic: Negative for immunocompromised state.  Neurological: Negative for facial asymmetry, weakness, numbness and headaches.  Hematological: Does not bruise/bleed easily.  Psychiatric/Behavioral: Negative for confusion and agitation.      Allergies  Review of patient's allergies indicates no known allergies.  Home Medications   Current Outpatient Rx  Name  Route  Sig  Dispense  Refill  . cholecalciferol (VITAMIN D) 1000 UNITS tablet   Oral   Take 1,000 Units by mouth daily.         . ferrous sulfate 325 (65 FE) MG tablet   Oral   Take 1 tablet (325 mg total) by mouth daily.   30 tablet   0   . ibuprofen (ADVIL,MOTRIN) 200 MG tablet   Oral   Take 600 mg by mouth every 6 (six) hours as needed for fever, headache or moderate pain.         Marland Kitchen omeprazole (PRILOSEC) 20 MG capsule   Oral   Take 1 capsule (20 mg total) by mouth daily.   30 capsule   0    BP 129/77  Pulse 88  Temp(Src) 98 F (36.7 C) (Oral)  Resp 16  SpO2 99%  LMP 04/21/2013 Physical Exam  Constitutional: She is oriented to person, place, and time. She appears well-developed and well-nourished. No distress.  HENT:  Head: Normocephalic and atraumatic.  Mouth/Throat: No oropharyngeal exudate.  Eyes: Pupils are equal, round, and reactive to light.  Neck: Normal range of motion. Neck supple.  Cardiovascular: Normal rate, regular rhythm and normal heart sounds.  Exam reveals no gallop and no  friction rub.   No murmur heard. Pulmonary/Chest: Effort normal and breath sounds normal. No respiratory distress. She has no wheezes. She has no rales. She exhibits tenderness and bony tenderness.    Abdominal: Soft. Bowel sounds are normal. She exhibits no distension and no mass. There is no tenderness. There is no rebound and no guarding.  Musculoskeletal: Normal range of motion. She exhibits no edema and no tenderness.  Neurological: She is alert and oriented to person, place, and time.  Skin: Skin is warm and dry.  Psychiatric: She has a normal mood and affect.    ED Course  Procedures (including critical care time) Labs Review Labs Reviewed  CBC WITH DIFFERENTIAL - Abnormal; Notable for the following:    RBC 5.55 (*)    Hemoglobin 10.1 (*)    HCT 33.2 (*)    MCV 59.8 (*)    MCH 18.2 (*)    RDW 27.1 (*)    Platelets 476 (*)    All other components within normal limits  BASIC METABOLIC PANEL - Abnormal; Notable for the following:    Sodium 135 (*)    Glucose, Bld 139 (*)    GFR calc non Af Amer 77 (*)    GFR calc Af Amer 89 (*)    All other components within normal limits  PRO B NATRIURETIC PEPTIDE  I-STAT TROPOININ, ED   Imaging Review Dg Chest 2 View  04/27/2013   CLINICAL DATA:  Chest pain  EXAM: CHEST  2 VIEW  COMPARISON:  DG CHEST 2 VIEW dated 04/18/2013  FINDINGS: Normal heart size. Under aerated lungs. Grossly clear. No pneumothorax. No pleural effusion. Bronchitic changes are likely chronic.  IMPRESSION: No active cardiopulmonary disease.   Electronically Signed   By: Maryclare Bean M.D.   On: 04/27/2013 15:32    EKG Interpretation   None       MDM   Final diagnoses:  Atypical chest pain    Pt is a 43 y.o. female with Pmhx as above who presents with 2 days of L sided, sharp, pleuritic CP.  Pt states she had similar presentation on 2/16, had Hb 7.7 and was transfused 2U PRBCs. She has found to have multiple borderline, some enlarged mediastinal lymph nodes, but  no PE. Trop not elevated.  On PE, VSS, pt in NAD.  Chest pain reproducible.  Cardiopulm exam otherwise benign. No LE ttp or edema. No risk factors for ACS or PE other than smoking. EKG w/o acute ischemic changes. Trop neg, BNP not elevated. CXR clear.  Hb improved today to 10.1. Suspect MSK pain or costochondritis.  She has PCP appt on Friday which she is encouraged to keep.  Return precautions given for new or worsening symptoms including worsening pain, trouble breathing, fever, leg swelling.             Neta Ehlers, MD 04/27/13 [phone]

## 2013-04-27 NOTE — Discharge Instructions (Signed)
Chest Pain (Nonspecific) °Chest pain has many causes. Your pain could be caused by something serious, such as a heart attack or a blood clot in the lungs. It could also be caused by something less serious, such as a chest bruise or a virus. Follow up with your doctor. More lab tests or other studies may be needed to find the cause of your pain. Most of the time, nonspecific chest pain will improve within 2 to 3 days of rest and mild pain medicine. °HOME CARE °· For chest bruises, you may put ice on the sore area for 15-20 minutes, 03-04 times a day. Do this only if it makes you feel better. °· Put ice in a plastic bag. °· Place a towel between the skin and the bag. °· Rest for the next 2 to 3 days. °· Go back to work if the pain improves. °· See your doctor if the pain lasts longer than 1 to 2 weeks. °· Only take medicine as told by your doctor. °· Quit smoking if you smoke. °GET HELP RIGHT AWAY IF:  °· There is more pain or pain that spreads to the arm, neck, jaw, back, or belly (abdomen). °· You have shortness of breath. °· You cough more than usual or cough up blood. °· You have very bad back or belly pain, feel sick to your stomach (nauseous), or throw up (vomit). °· You have very bad weakness. °· You pass out (faint). °· You have a fever. °Any of these problems may be serious and may be an emergency. Do not wait to see if the problems will go away. Get medical help right away. Call your local emergency services 911 in [place] Do not drive yourself to the hospital. °MAKE SURE YOU:  °· Understand these instructions. °· Will watch this condition. °· Will get help right away if you or your child is not doing well or gets worse. °Document Released: 08/06/2007 Document Revised: 05/12/2011 Document Reviewed: 08/06/2007 °ExitCare® Patient Information ©2014 [employer]. ° °

## 2013-05-03 ENCOUNTER — Ambulatory Visit (HOSPITAL_COMMUNITY)
Admission: RE | Admit: 2013-05-03 | Discharge: 2013-05-03 | Disposition: A | Discharge status: Home / Self Care | Payer: Medicaid Other | Source: Ambulatory Visit | Attending: Obstetrics and Gynecology | Admitting: Obstetrics and Gynecology

## 2013-05-03 ENCOUNTER — Ambulatory Visit (HOSPITAL_COMMUNITY)
Admission: RE | Admit: 2013-05-03 | Discharge: 2013-05-03 | Disposition: A | Discharge status: Home / Self Care | Payer: No Typology Code available for payment source | Source: Ambulatory Visit | Attending: Obstetrics and Gynecology | Admitting: Obstetrics and Gynecology

## 2013-05-03 ENCOUNTER — Encounter (HOSPITAL_COMMUNITY): Payer: Self-pay

## 2013-05-03 VITALS — BP 116/78 | Temp 98.9°F | Ht 66.0 in | Wt 273.6 lb

## 2013-05-03 DIAGNOSIS — Z1231 Encounter for screening mammogram for malignant neoplasm of breast: Secondary | ICD-10-CM | POA: Insufficient documentation

## 2013-05-03 DIAGNOSIS — Z01419 Encounter for gynecological examination (general) (routine) without abnormal findings: Secondary | ICD-10-CM

## 2013-05-03 HISTORY — DX: Anemia, unspecified: D64.9

## 2013-05-03 NOTE — Progress Notes (Signed)
No complaints today.  Pap Smear:    Pap smear completed today. Patients last Pap smear was 08/08/2009 and normal. Per patient has a history of an abnormal Pap smear in 2007 that required a repeat Pap smear for follow-up that was normal. Per patient has had at least three normal Pap smears since abnormal Pap smear in 2007. Last Pap smear result is in EPIC.  Physical exam: Breasts Breasts symmetrical. No skin abnormalities bilateral breasts. No nipple retraction bilateral breasts. No nipple discharge bilateral breasts. No lymphadenopathy. No lumps palpated bilateral breasts. No complaints of pain or tenderness on exam. Patient escorted to mammography for a screening mammogram.          Pelvic/Bimanual   Ext Genitalia No lesions, no swelling and no discharge observed on external genitalia.         Vagina Vagina pink and normal texture. No lesions or discharge observed in vagina.          Cervix Cervix is present. Cervix pink and of normal texture. Small amount of thick white discharge observed on cervical os.     Uterus Uterus is present and palpable. Uterus in normal position and normal size.        Adnexae Bilateral ovaries present and palpable. No tenderness on palpation.          Rectovaginal No rectal exam completed today since patient had no rectal complaints. No skin abnormalities observed on exam.

## 2013-05-03 NOTE — Patient Instructions (Signed)
Taught Rebecca Johnston how to perform BSE and gave educational materials to take home. Let her know [facility] will cover Pap smears every 3 years unless has a history of abnormal Pap smears. Let patient know will follow up with her within the next couple weeks with results by letter and/or phone. [place] verbalized understanding. Patient escorted to mammography for a screening mammogram.  Kayanna Mckillop, Arvil Chaco, RN 9:28 AM

## 2013-05-06 ENCOUNTER — Ambulatory Visit (HOSPITAL_BASED_OUTPATIENT_CLINIC_OR_DEPARTMENT_OTHER): Payer: Medicaid Other

## 2013-05-06 ENCOUNTER — Other Ambulatory Visit: Payer: No Typology Code available for payment source

## 2013-05-06 VITALS — BP 130/90 | HR 98 | Temp 98.7°F | Resp 22 | Ht 63.75 in | Wt 277.1 lb

## 2013-05-06 DIAGNOSIS — Z0000 Encounter for general adult medical examination without abnormal findings: Secondary | ICD-10-CM

## 2013-05-06 LAB — HEMOGLOBIN A1C
HEMOGLOBIN A1C: 5.9 % — AB (ref <5.7)
MEAN PLASMA GLUCOSE: 123 mg/dL — AB (ref <117)

## 2013-05-06 LAB — GLUCOSE (CC13): Glucose: 79 mg/dl (ref 70–140)

## 2013-05-06 LAB — LIPID PANEL
CHOL/HDL RATIO: 4.1 ratio
Cholesterol: 145 mg/dL (ref 0–200)
HDL: 35 mg/dL — AB (ref >39)
LDL Cholesterol: 90 mg/dL (ref 0–99)
Triglycerides: 98 mg/dL (ref <150)
VLDL: 20 mg/dL (ref 0–40)

## 2013-05-06 NOTE — Progress Notes (Signed)
Patient is a new patient to the [facility] program and is currently a [facility] patient effective 05/03/2013.   Clinical Measurements: Patient is 5 ft. 3.75 inches, weight 277.1 lbs, waist circumference 50.5 inches, and hip circumference 52.5inches.   Medical History: Patient has no history of high cholesterol. Patient does not have a history of hypertension or diabetes. Per patient no diagnosed history of coronary heart disease, heart attack, heart failure, stroke/TIA, vascular disease or congenital heart defects. Patient states that has been experiencing chest pain for the past 22 days. The pain is more on inspiration. Patient reported that all test related to heart or possible pulmonary emboli have been negative.  Blood Pressure, Self-measurement: Patient states has no reason to check Blood pressure.  Nutrition Assessment: Patient stated that eats 1 fruit every day. Patient states she eats one to 2 servings of vegetables a day.. She does eat 3 or more ounces of whole grains daily. Patient doesn't eat two or more servings of fish weekly. Patient states she does drink more than 36 ounces or 450 calories of beverages with added sugars weekly.Patient stated she does watch her salt intake.   Physical Activity Assessment: Patient stated she does 630 minutes of exercise weekly including walking and household chores. Patient does not do any vigorous activity during the week.  Smoking Status: Patient has never smoked and is not in closed areas with smokers.  Quality of Life Assessment: In assessing patient's quality of life she stated that out of the past 30 days that she has felt her health is good all of them. Patient also stated that in the past 30 days that her mental health is not good including stress, depression and problems with emotions for 3 days. Patient did state that out of the past 30 days she felt her physical or mental health had not kept her from doing her usual activities including self-care,  work or recreation.   Plan: Lab work will be done today including a lipid panel, blood glucose, and Hgb A1C. Will call lab results when they are finished. Patient will returning for Health Coaching for nutrition. Will work on behavior modifications that we discussed to lower BP. Will obtain appointment at [facility] for HTN.

## 2013-05-09 NOTE — Patient Instructions (Signed)
Discussed health assessment with patient. Will refer patient to [facility] for high blood pressure. Let patient know that will call her to follow up and inform her about labs, health Coaching and doctors appointment.. Instructed patient about  decreasing stress, salt intake, and other behavior modifications. Discussed the different kinds of nutritional information and plans that are available for her use. Patient verbalized understanding.

## 2013-05-11 ENCOUNTER — Telehealth (HOSPITAL_COMMUNITY): Payer: Self-pay | Admitting: *Deleted

## 2013-05-11 NOTE — Telephone Encounter (Signed)
Telephoned patient at home # and left message to return call to [facility] 

## 2013-05-18 ENCOUNTER — Telehealth: Payer: Self-pay

## 2013-05-18 NOTE — Telephone Encounter (Signed)
Called to inform patient of lab results and ofappointment at [facility] on March 25th at 9 am. Patient stated that would be unable to go at that time because of school. I gave her the telephone number to clinic to change her appointment.

## 2013-05-25 ENCOUNTER — Ambulatory Visit: Payer: Self-pay | Admitting: Internal Medicine

## 2013-05-30 ENCOUNTER — Encounter: Payer: Self-pay | Admitting: Internal Medicine

## 2013-05-30 ENCOUNTER — Ambulatory Visit: Payer: Medicaid Other | Attending: Internal Medicine | Admitting: Internal Medicine

## 2013-05-30 VITALS — BP 117/81 | HR 96 | Temp 98.0°F | Ht 67.0 in | Wt 265.0 lb

## 2013-05-30 DIAGNOSIS — R911 Solitary pulmonary nodule: Secondary | ICD-10-CM | POA: Insufficient documentation

## 2013-05-30 DIAGNOSIS — D62 Acute posthemorrhagic anemia: Secondary | ICD-10-CM | POA: Insufficient documentation

## 2013-05-30 LAB — COMPLETE METABOLIC PANEL WITH GFR
ALT: 16 U/L (ref 0–35)
AST: 23 U/L (ref 0–37)
Albumin: 3.8 g/dL (ref 3.5–5.2)
Alkaline Phosphatase: 134 U/L — ABNORMAL HIGH (ref 39–117)
BUN: 12 mg/dL (ref 6–23)
CALCIUM: 9.5 mg/dL (ref 8.4–10.5)
CO2: 25 meq/L (ref 19–32)
Chloride: 100 mEq/L (ref 96–112)
Creat: 0.93 mg/dL (ref 0.50–1.10)
GFR, EST AFRICAN AMERICAN: 87 mL/min
GFR, EST NON AFRICAN AMERICAN: 76 mL/min
GLUCOSE: 112 mg/dL — AB (ref 70–99)
POTASSIUM: 4.3 meq/L (ref 3.5–5.3)
SODIUM: 135 meq/L (ref 135–145)
TOTAL PROTEIN: 8.2 g/dL (ref 6.0–8.3)
Total Bilirubin: 0.4 mg/dL (ref 0.2–1.2)

## 2013-05-30 LAB — CBC WITH DIFFERENTIAL/PLATELET
Basophils Absolute: 0 10*3/uL (ref 0.0–0.1)
Basophils Relative: 0 % (ref 0–1)
Eosinophils Absolute: 0.2 10*3/uL (ref 0.0–0.7)
Eosinophils Relative: 2 % (ref 0–5)
HCT: 34.7 % — ABNORMAL LOW (ref 36.0–46.0)
Hemoglobin: 10.6 g/dL — ABNORMAL LOW (ref 12.0–15.0)
Lymphocytes Relative: 15 % (ref 12–46)
Lymphs Abs: 1.5 10*3/uL (ref 0.7–4.0)
MCH: 18.2 pg — ABNORMAL LOW (ref 26.0–34.0)
MCHC: 30.5 g/dL (ref 30.0–36.0)
MCV: 59.7 fL — ABNORMAL LOW (ref 78.0–100.0)
Monocytes Absolute: 1 10*3/uL (ref 0.1–1.0)
Monocytes Relative: 10 % (ref 3–12)
Neutro Abs: 7.5 10*3/uL (ref 1.7–7.7)
Neutrophils Relative %: 73 % (ref 43–77)
Platelets: 468 10*3/uL — ABNORMAL HIGH (ref 150–400)
RBC: 5.81 MIL/uL — ABNORMAL HIGH (ref 3.87–5.11)
RDW: 24.6 % — ABNORMAL HIGH (ref 11.5–15.5)
WBC: 10.3 10*3/uL (ref 4.0–10.5)

## 2013-05-30 LAB — TSH: TSH: 1.983 u[IU]/mL (ref 0.350–4.500)

## 2013-05-30 LAB — T4, FREE: Free T4: 1.37 ng/dL (ref 0.80–1.80)

## 2013-05-30 MED ORDER — ALBUTEROL SULFATE HFA 108 (90 BASE) MCG/ACT IN AERS: 2.0000 | INHALATION_SPRAY | Freq: Four times a day (QID) | RESPIRATORY_TRACT | Status: DC | PRN

## 2013-05-30 NOTE — Progress Notes (Signed)
Patient is here today to establish care. She had a  Blood transfusion in February and is concerned about her hemoglobin.

## 2013-05-30 NOTE — Progress Notes (Signed)
Patient ID: Rebecca Johnston, female   DOB: 04-Jan-1971, 43 y.o.   MRN: [id number]   CC:  HPI: 43 year-old female here to establish care. The patient was recently evaluated by the [facility]. program at the cancer center for anemia. She reports abnormal menstrual cycles that last 7 days. She received 2 units of packed red blood cells in February. She also was in the ER for chest pain, had a CT of the chest that showed multiple pulmonary nodules, no bone embolism. She reports continuous intermittent pleuritic chest pain as well as dyspnea on exertion. 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 Anemia    Current Outpatient Prescriptions on File Prior to Visit  Medication Sig Dispense Refill  . cholecalciferol (VITAMIN D) 1000 UNITS tablet Take 1,000 Units by mouth daily.      . ferrous sulfate 325 (65 FE) MG tablet Take 1 tablet (325 mg total) by mouth daily.  30 tablet  0  . ibuprofen (ADVIL,MOTRIN) 200 MG tablet Take 600 mg by mouth every 6 (six) hours as needed for fever, headache or moderate pain.      Marland Kitchen omeprazole (PRILOSEC) 20 MG capsule Take 1 capsule (20 mg total) by mouth daily.  30 capsule  0   No current facility-administered medications on file prior to visit.   Family History  Problem Relation Age of Onset  . Hypertension Mother   . Diabetes Father   . Breast cancer Maternal Aunt   . Diabetes Maternal Aunt   . Diabetes Paternal Aunt   . Diabetes Maternal Aunt   . Diabetes Maternal Aunt    History   Social History  . Marital Status: Single    Spouse Name: N/A    Number of Children: N/A  . Years of Education: N/A   Occupational History  . Not on file.   Social History Main Topics  . Smoking status:  Current Every Day Smoker -- 0.25 packs/day for 20 years  . Smokeless tobacco: Never Used  . Alcohol Use: Yes     Comment: socially  . Drug Use: No  . Sexual Activity: Not Currently    Birth Control/ Protection: None   Other Topics Concern  . Not on file   Social History Narrative  . No narrative on file    Review of Systems  Constitutional: Negative for fever, chills, diaphoresis, activity change, appetite change and fatigue.  HENT: Negative for ear pain, nosebleeds, congestion, facial swelling, rhinorrhea, neck pain, neck stiffness and ear discharge.   Eyes: Negative for pain, discharge, redness, itching and visual disturbance.  Respiratory as in history of present illness Cardiovascular: Negative for chest pain, palpitations and leg swelling.  Gastrointestinal: Negative for abdominal distention.  Genitourinary: Negative for dysuria, urgency, frequency, hematuria, flank pain, decreased urine volume, difficulty urinating and dyspareunia.  Musculoskeletal: Negative for back pain, joint swelling, arthralgias and gait problem.  Neurological: Negative for dizziness, tremors, seizures, syncope, facial asymmetry, speech difficulty, weakness, light-headedness, numbness and headaches.  Hematological: Negative for adenopathy. Does not bruise/bleed easily.  Psychiatric/Behavioral: Negative for hallucinations, behavioral problems, confusion, dysphoric mood, decreased concentration and agitation.    Objective:   Filed Vitals:   05/30/13 0912  BP: 117/81  Pulse:  96  Temp: 98 F (36.7 C)    Physical Exam  Constitutional: Appears well-developed and well-nourished. No distress.  HENT: Normocephalic. External right and left ear normal. Oropharynx is clear and moist.  Eyes: Conjunctivae and EOM are normal. PERRLA, no scleral icterus.  Neck: Normal ROM. Neck supple. No JVD. No tracheal deviation. No thyromegaly.  CVS: RRR, S1/S2 +, no murmurs, no gallops, no carotid bruit.  Pulmonary:  Effort and breath sounds normal, no stridor, rhonchi, wheezes, rales.  Abdominal: Soft. BS +,  no distension, tenderness, rebound or guarding.  Musculoskeletal: Normal range of motion. No edema and no tenderness.  Lymphadenopathy: No lymphadenopathy noted, cervical, inguinal. Neuro: Alert. Normal reflexes, muscle tone coordination. No cranial nerve deficit. Skin: Skin is warm and dry. No rash noted. Not diaphoretic. No erythema. No pallor.  Psychiatric: Normal mood and affect. Behavior, judgment, thought content normal.   Lab Results  Component Value Date   WBC 9.6 04/27/2013   HGB 10.1* 04/27/2013   HCT 33.2* 04/27/2013   MCV 59.8* 04/27/2013   PLT 476* 04/27/2013   Lab Results  Component Value Date   CREATININE 0.90 04/27/2013   BUN 10 04/27/2013   NA 135* 04/27/2013   K 4.0 04/27/2013   CL 97 04/27/2013   CO2 24 04/27/2013    Lab Results  Component Value Date   HGBA1C 5.9* 05/06/2013   Lipid Panel     Component Value Date/Time   CHOL 145 05/06/2013 1240   TRIG 98 05/06/2013 1240   HDL 35* 05/06/2013 1240   CHOLHDL 4.1 05/06/2013 1240   VLDL 20 05/06/2013 1240   LDLCALC 90 05/06/2013 1240       Assessment and plan:   There are no active problems to display for this patient.  Shortness of breath Most likely secondary to anemia Given multiple borderline pulmonary nodules and hilar lymph nodes Concern about malignancy or sarcoidosis We'll refer to pulmonology for further evaluation Patient prescribed albuterol as a rescue inhaler Monitor CBC  Anemia Continue ferrous sulfate Repeat CBC Patient advised to take Prilosec along with ibuprofen if needed   Establish care Gynecology referral for abnormal menstrual bleeding Pap smear mammogram done every 2015 Tetanus vaccination 2015 Refusing flu vaccination Hemoglobin A1c 5.9 Repeat hemoglobin A1c in 3 months   smoking cessation counseling done  Patient to follow up in one to 2 months for shortness of breath   The patient was  given clear instructions to go to ER or return to medical center if symptoms don't improve, worsen or new problems develop. The patient verbalized understanding. The patient was told to call to get any lab results if not heard anything in the next week.

## 2013-05-31 LAB — VITAMIN D 25 HYDROXY (VIT D DEFICIENCY, FRACTURES): VIT D 25 HYDROXY: 30 ng/mL (ref 30–89)

## 2013-06-01 ENCOUNTER — Telehealth: Payer: Self-pay | Admitting: Emergency Medicine

## 2013-06-01 ENCOUNTER — Telehealth (HOSPITAL_COMMUNITY): Payer: Self-pay | Admitting: *Deleted

## 2013-06-01 MED ORDER — FERROUS SULFATE 325 (65 FE) MG PO TABS: 325.0000 mg | ORAL_TABLET | Freq: Three times a day (TID) | ORAL | Status: DC

## 2013-06-01 NOTE — Telephone Encounter (Signed)
Left message for pt to call clinic

## 2013-06-01 NOTE — Telephone Encounter (Signed)
Message copied by Ricci Barker on Wed Jun 01, 2013  5:35 PM ------      Message from: Allyson Sabal MD, [facility]      Created: Wed Jun 01, 2013 11:10 AM       Notify patient that she has iron deficiency anemia. Prescription for ferrous sulfate has been called in to [facility] in [place]. she should stop taking ibuprofen Advil Aleve aspirin. She should only take Tylenol if she needs anything for pain. ------

## 2013-06-01 NOTE — Addendum Note (Signed)
Addended by: Allyson Sabal MD, [facility] on: 06/01/2013 11:09 AM   Modules accepted: Orders

## 2013-06-01 NOTE — Telephone Encounter (Signed)
Telephoned patient at home # and left message to return call to [facility] 

## 2013-06-02 ENCOUNTER — Telehealth (HOSPITAL_COMMUNITY): Payer: Self-pay | Admitting: *Deleted

## 2013-06-02 NOTE — Telephone Encounter (Signed)
Patient returned call to [facility]. Advised patient of negative pap smear results. Next pap due in 3 years. Patient voiced understanding.

## 2013-06-03 ENCOUNTER — Ambulatory Visit (HOSPITAL_COMMUNITY): Payer: Medicaid Other

## 2013-06-08 ENCOUNTER — Telehealth (HOSPITAL_COMMUNITY): Payer: Self-pay | Admitting: *Deleted

## 2013-06-08 NOTE — Telephone Encounter (Signed)
Patient received a bill from [place] for charges for labs that were drawn through [facility] program. Let patient know the charges have been fixed. Let her know that if she receives any other bills to call me. Patient verbalized understanding.

## 2013-06-09 ENCOUNTER — Ambulatory Visit (HOSPITAL_COMMUNITY)
Admission: RE | Admit: 2013-06-09 | Discharge: 2013-06-09 | Disposition: A | Discharge status: Home / Self Care | Payer: Medicaid Other | Source: Ambulatory Visit | Attending: Internal Medicine | Admitting: Internal Medicine

## 2013-06-09 DIAGNOSIS — N83209 Unspecified ovarian cyst, unspecified side: Secondary | ICD-10-CM | POA: Insufficient documentation

## 2013-06-09 DIAGNOSIS — R911 Solitary pulmonary nodule: Secondary | ICD-10-CM

## 2013-06-09 DIAGNOSIS — N949 Unspecified condition associated with female genital organs and menstrual cycle: Secondary | ICD-10-CM | POA: Insufficient documentation

## 2013-06-09 DIAGNOSIS — N925 Other specified irregular menstruation: Secondary | ICD-10-CM | POA: Insufficient documentation

## 2013-06-09 DIAGNOSIS — D250 Submucous leiomyoma of uterus: Secondary | ICD-10-CM | POA: Insufficient documentation

## 2013-06-09 DIAGNOSIS — N938 Other specified abnormal uterine and vaginal bleeding: Secondary | ICD-10-CM | POA: Insufficient documentation

## 2013-06-09 DIAGNOSIS — D62 Acute posthemorrhagic anemia: Secondary | ICD-10-CM

## 2013-06-10 ENCOUNTER — Telehealth: Payer: Self-pay | Admitting: Emergency Medicine

## 2013-06-10 DIAGNOSIS — D219 Benign neoplasm of connective and other soft tissue, unspecified: Secondary | ICD-10-CM

## 2013-06-10 NOTE — Telephone Encounter (Signed)
Pt given lab results and instructions to start taking prescribed Ferrous Sulfate sent to pharmacy. Pt states she is unable to afford GYN appt until Medicaid approved

## 2013-06-10 NOTE — Progress Notes (Signed)
Pt given u/s results and GYN referral

## 2013-06-10 NOTE — Telephone Encounter (Signed)
Message copied by Ricci Barker on Fri Jun 10, 2013  5:41 PM ------      Message from: Allyson Sabal MD, [facility]      Created: Fri Jun 10, 2013  3:05 PM       7.6 cm cyst within the right ovary seen on transvaginal ultrasound, the patient needs to be seen by a gynecologist for further evaluation of this cyst. Please provide a referral ------

## 2013-06-17 ENCOUNTER — Institutional Professional Consult (permissible substitution): Payer: Self-pay | Admitting: Internal Medicine

## 2013-07-04 ENCOUNTER — Encounter: Payer: Self-pay | Admitting: Nurse Practitioner

## 2013-07-11 ENCOUNTER — Encounter: Payer: Self-pay | Admitting: Obstetrics & Gynecology

## 2013-08-16 ENCOUNTER — Telehealth: Payer: Self-pay

## 2013-08-16 NOTE — Telephone Encounter (Signed)
REASON: Called patient to follow up on [employer] risk reduction counseling that patient had voiced interest. The areas include nutrition, activity and smoking cessation.  COUNSELING: When talked to patient about having cancelled two GYN appointments and what her GYN problems were. She stated that she had just gotten Medicare. Patient voiced a lot of concerns and confusion about what was going on and what she needed to do. We discussed all the areas of GYN and Pulmonary referrals. Patient voice that still would like to quit smoking. Discussed past bills that patient was concerned with. Informed patient that could not bring back to do Health Coaching because she has Medicare.  PLAN: Patient will make appointments for GYN Clinic and [facility]. Will call financial at [facility] to give them her Medicare number to help with past bills. Will come to smoking cessation classes either in July or August.

## 2013-08-30 ENCOUNTER — Telehealth: Payer: Self-pay

## 2013-08-30 NOTE — Telephone Encounter (Signed)
Patient was called to follow up on smoking cessation. Per patient needs to quit. Discussed dates of class and attendance. Patient Verbalized understanding.  Plan: Will sign patient up for smoking cessation class and call to remind her. She will call the office if needs to change the plan.

## 2013-09-01 ENCOUNTER — Ambulatory Visit: Payer: Medicaid Other | Admitting: Internal Medicine

## 2013-09-01 ENCOUNTER — Encounter: Payer: Self-pay | Admitting: Internal Medicine

## 2013-09-01 VITALS — BP 112/66 | HR 83 | Temp 98.0°F | Ht 66.0 in | Wt 271.6 lb

## 2013-09-01 DIAGNOSIS — R079 Chest pain, unspecified: Secondary | ICD-10-CM

## 2013-09-01 DIAGNOSIS — R9389 Abnormal findings on diagnostic imaging of other specified body structures: Secondary | ICD-10-CM

## 2013-09-01 NOTE — Progress Notes (Signed)
Subjective:    Patient ID: Rebecca Johnston, female    DOB: Oct 13, 1970 MRN: [id number]  HPI   18 yobf smoker never respiratory problems until 3 days prior to presenting to ER 04/18/13 with cp > abn CT referred by [facility] for eval  09/01/2013 .   09/01/2013 1st [facility] office visit/ Jennene Downie  Chief Complaint  Patient presents with  . Pulmonary Consult    Referred per Dr. Reyne Dumas for eval of abn CT Chest done Feb 2015. Pt c/o occ CP since Feb 2015.  Pain is sharp and comes and goes.   acute onset midline cp paroxysmal, fleeting  Sometimes assoc with cough /  can last up to an hour, ? some better with aleve, avg about 3x per week.  Pain is much better since onset in Feb.   Radiated straight to back s assoc n or sob, diaphoresis- not brought on by coughing or exertion but once having it feels cough makes it worse.  No obvious day to day or daytime variabilty or assoc   chest tightness, subjective wheeze overt sinus or hb symptoms. No unusual exp hx or h/o childhood pna/ asthma or knowledge of premature birth.  Sleeping ok without nocturnal  or early am exacerbation  of respiratory  c/o's or need for noct saba. Also denies any obvious fluctuation of symptoms with weather or environmental changes or other aggravating or alleviating factors except as outlined above   Current Medications, Allergies, Complete Past Medical History, Past Surgical History, Family History, and Social History were reviewed in [employer] record.             Review of Systems  Constitutional: Negative for fever, chills and unexpected weight change.  HENT: Negative for congestion, dental problem, ear pain, nosebleeds, postnasal drip, rhinorrhea, sinus pressure, sneezing, sore throat, trouble swallowing and voice change.   Eyes: Negative for visual disturbance.  Respiratory: Negative for cough, choking and shortness of breath.   Cardiovascular: Positive for chest  pain. Negative for leg swelling.  Gastrointestinal: Negative for vomiting, abdominal pain and diarrhea.  Genitourinary: Negative for difficulty urinating.  Musculoskeletal: Negative for arthralgias.  Skin: Negative for rash.  Neurological: Negative for tremors, syncope and headaches.  Hematological: Does not bruise/bleed easily.       Objective:   Physical Exam  amb obese bf nad - exceptionally vague and evasive historian.  Wt Readings from Last 3 Encounters:  09/01/13 271 lb 9.6 oz (123.197 kg)  05/30/13 265 lb (120.203 kg)  05/06/13 277 lb 1.6 oz (125.692 kg)      HEENT: nl dentition, turbinates, and orophanx. Nl external ear canals without cough reflex   NECK :  without JVD/Nodes/TM/ nl carotid upstrokes bilaterally   LUNGS: no acc muscle use, clear to A and P bilaterally without cough on insp or exp maneuvers   CV:  RRR  no s3 or murmur or increase in P2, no edema   ABD:  soft and nontender with nl excursion in the supine position. No bruits or organomegaly, bowel sounds nl  MS:  warm without deformities, calf tenderness, cyanosis or clubbing  SKIN: warm and dry without lesions    NEURO:  alert, approp, no deficits   CTa  04/18/13  1. Study limited by body habitus with no pulmonary embolism  detected.  2. Multiple borderline and a few mildly enlarged nonspecific  mediastinal and hilar lymph nodes. Multiple pulmonary nodules  bilaterally the largest measuring 8 mm.  If the patient is at high  risk for bronchogenic carcinoma, follow-up chest CT at 3-25months is  recommended. If the patient is at low risk for bronchogenic  carcinoma, follow-up chest CT at 6-12 months is recommended.           Assessment & Plan:

## 2013-09-01 NOTE — Patient Instructions (Signed)
Start taking prilosec 20 mg Take 30- 60 min before your first and last meals of the day  X  2 week and stop the aleve  GERD (REFLUX)  is an extremely common cause of respiratory symptoms, many times with no significant heartburn at all.    It can be treated with medication, but also with lifestyle changes including avoidance of late meals, excessive alcohol, smoking cessation, and avoid fatty foods, chocolate, peppermint, colas, red wine, and acidic juices such as orange juice.  NO MINT OR MENTHOL PRODUCTS SO NO COUGH DROPS  USE SUGARLESS CANDY INSTEAD (jolley ranchers or Stover's)  NO OIL BASED VITAMINS - use powdered substitutes.   Return in 2 weeks if chest pain not better - if better return to your primary doctor and I will advise on follow up for the abnormal CT Chest

## 2013-09-02 DIAGNOSIS — R9389 Abnormal findings on diagnostic imaging of other specified body structures: Secondary | ICD-10-CM | POA: Insufficient documentation

## 2013-09-02 DIAGNOSIS — R079 Chest pain, unspecified: Secondary | ICD-10-CM | POA: Insufficient documentation

## 2013-09-02 HISTORY — DX: Abnormal findings on diagnostic imaging of other specified body structures: R93.89

## 2013-09-02 NOTE — Assessment & Plan Note (Signed)
Not really pleuritic, not exertional, most likely related to anxiety or GERD so try ppi bid plus diet x 2 weeks and if resolves then return to primary care, if not, return here.

## 2013-09-02 NOTE — Assessment & Plan Note (Signed)
Although there are clearly abnormalities on CT scan, they should probably be considered "microscopic" since not obvious on plain cxr .     In the setting of obvious "macroscopic" health issues,  I am very reluctatnt to embark on an invasive w/u at this point but will arrange consevative  follow up and in the meantime see what we can do to address the patient's subjective concerns.    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

## 2013-09-15 ENCOUNTER — Ambulatory Visit: Payer: Medicaid Other | Admitting: Internal Medicine

## 2013-09-22 ENCOUNTER — Telehealth: Payer: Self-pay | Admitting: *Deleted

## 2013-09-22 DIAGNOSIS — R9389 Abnormal findings on diagnostic imaging of other specified body structures: Secondary | ICD-10-CM

## 2013-09-22 NOTE — Telephone Encounter (Signed)
Message copied by Rosana Berger on Thu Sep 22, 2013 10:35 AM ------      Message from: Tanda Rockers      Created: Fri Sep 02, 2013 12:24 PM       Make sure she's had repeat CT with contrast f/u nodes/ nodules ------

## 2013-09-22 NOTE — Telephone Encounter (Signed)
ATC the pt, NA and the mailbox was full so unable to leave a msg, [facility]

## 2013-09-28 NOTE — Telephone Encounter (Signed)
This is very standard given her smoking hx but it's not mandatory - the other option would be a plain cxr in 04/2013 but in meantime strongly rec stop smoking

## 2013-09-28 NOTE — Telephone Encounter (Signed)
Spoke with the pt  She states CP has resolved  I advised that she is due for f/u ct chest  She wants to make sure of this, since it was not mentioned to her at her last visit  Please advise thanks

## 2013-09-28 NOTE — Telephone Encounter (Signed)
ATC PT NA MAILBOX FULL. WCB

## 2013-09-29 NOTE — Telephone Encounter (Signed)
LMTCB

## 2013-10-03 ENCOUNTER — Encounter: Payer: Self-pay | Admitting: Obstetrics & Gynecology

## 2013-10-03 ENCOUNTER — Ambulatory Visit (INDEPENDENT_AMBULATORY_CARE_PROVIDER_SITE_OTHER): Payer: Medicaid Other | Admitting: Obstetrics & Gynecology

## 2013-10-03 VITALS — BP 122/60 | HR 87 | Temp 98.2°F | Ht 66.0 in | Wt 269.8 lb

## 2013-10-03 DIAGNOSIS — D259 Leiomyoma of uterus, unspecified: Secondary | ICD-10-CM

## 2013-10-03 DIAGNOSIS — D649 Anemia, unspecified: Secondary | ICD-10-CM | POA: Insufficient documentation

## 2013-10-03 LAB — POCT PREGNANCY, URINE: Preg Test, Ur: NEGATIVE

## 2013-10-03 MED ORDER — MEDROXYPROGESTERONE ACETATE 5 MG PO TABS: 5.0000 mg | ORAL_TABLET | Freq: Every day | ORAL | Status: DC

## 2013-10-03 MED ORDER — LEUPROLIDE ACETATE (3 MONTH) 11.25 MG IM KIT: 11.2500 mg | PACK | Freq: Once | INTRAMUSCULAR | Status: DC

## 2013-10-03 NOTE — Progress Notes (Signed)
Patient ID: LIESA TSAN, female   DOB: 09-21-1970, 43 y.o.   MRN: [id number]  Chief Complaint  Patient presents with  . Gynecologic Exam    HPI Rebecca Johnston is a 43 y.o. female.  G1P0010 Patient's last menstrual period was 08/24/2013. Heavy menses and pain regular cycle, h/o anemia and transfusion 2/15. Fibroids and right adnexal cyst on [place]. Normal pap 3/15  HPI  Past Medical History  Diagnosis Date  . Anemia   . Obesity, Class III, BMI 40-49.9 (morbid obesity)   . Fibroids     Past Surgical History  Procedure Laterality Date  . Ovarian cyst removal      Family History  Problem Relation Age of Onset  . Hypertension Mother   . Diabetes Father   . Breast cancer Maternal Aunt   . Diabetes Maternal Aunt   . Diabetes Paternal Aunt   . Diabetes Maternal Aunt   . Diabetes Maternal Aunt     Social History History  Substance Use Topics  . Smoking status: Current Every Day Smoker -- 0.50 packs/day for 25 years    Types: Cigarettes  . Smokeless tobacco: Never Used  . Alcohol Use: Yes     Comment: socially    No Known Allergies  Current Outpatient Prescriptions  Medication Sig Dispense Refill  . acetaminophen (TYLENOL) 325 MG tablet Take 650 mg by mouth every 6 (six) hours as needed.      Marland Kitchen albuterol (PROVENTIL HFA;VENTOLIN HFA) 108 (90 BASE) MCG/ACT inhaler Inhale 2 puffs into the lungs every 6 (six) hours as needed for wheezing or shortness of breath.  1 Inhaler  3  . cholecalciferol (VITAMIN D) 1000 UNITS tablet Take 1,000 Units by mouth daily.      . ferrous sulfate 325 (65 FE) MG tablet Take 1 tablet (325 mg total) by mouth 3 (three) times daily with meals.  90 tablet  6  . omeprazole (PRILOSEC) 20 MG capsule Take 1 capsule (20 mg total) by mouth daily.  30 capsule  0  . ibuprofen (ADVIL,MOTRIN) 200 MG tablet Take 600 mg by mouth every 6 (six) hours as needed for fever, headache or moderate pain.      . medroxyPROGESTERone (PROVERA) 5 MG tablet Take 1 tablet (5 mg  total) by mouth daily.  30 tablet  6   Current Facility-Administered Medications  Medication Dose Route Frequency Provider Last Rate Last Dose  . leuprolide (LUPRON) injection 11.25 mg  11.25 mg Intramuscular Once Woodroe Mode, MD        Review of Systems Review of Systems  Constitutional: Negative.   Respiratory: Negative.   Cardiovascular: Negative.   Gastrointestinal: Negative.   Genitourinary: Positive for menstrual problem and pelvic pain (dysmenorrhea). Negative for dysuria, vaginal bleeding and vaginal discharge.    Blood pressure 122/60, pulse 87, temperature 98.2 F (36.8 C), height 5\' 6"  (1.676 m), weight 269 lb 12.8 oz (122.38 kg), last menstrual period 08/24/2013.  Physical Exam Physical Exam  Nursing note and vitals reviewed. Constitutional: She is oriented to person, place, and time. She appears well-developed. No distress.  obese  Pulmonary/Chest: Effort normal. No respiratory distress.  Abdominal: Soft. She exhibits no mass. There is no tenderness.  Genitourinary: Vagina normal. No vaginal discharge found.  Pelvic exam: normal external genitalia, vulva, vagina, cervix, uterus and adnexa, UTERUS: uterus is enlarged 10 weels size, shape, consistency and nontender,.   Neurological: She is alert and oriented to person, place, and time.  Skin: Skin is warm and  dry.  Psychiatric: She has a normal mood and affect. Her behavior is normal.    Data Reviewed  CLINICAL DATA: Abnormal uterine bleeding, pain.  EXAM:  TRANSABDOMINAL AND TRANSVAGINAL ULTRASOUND OF PELVIS  TECHNIQUE:  Both transabdominal and transvaginal ultrasound examinations of the  pelvis were performed. Transabdominal technique was performed for  global imaging of the pelvis including uterus, ovaries, adnexal  regions, and pelvic cul-de-sac. It was necessary to proceed with  endovaginal exam following the transabdominal exam to visualize the  uterus, endometrium, ovaries and adnexa .  COMPARISON: CT  ABD W/CM dated 04/30/2005; CT PELVIS W/CM dated  04/30/2005  FINDINGS:  Uterus  Measurements: 16.0 x 7.2 x 8.8 cm. Multiple fibroids are noted. The  largest is in the fundus measuring up to 7.4 cm. Central submucosal  fibroid measures up to 3.8 cm. Left lower uterus segment fibroid  measures up to 6.5 cm.  Endometrium  Thickness: Not well visualized due to multiple fibroids. Areas  visualized measure up to 9-10 mm in thickness. No focal abnormality  visualized.  Right ovary  Measurements: 7.8 x 6.2 x 4.9 cm. 7.6 cm cyst within the right  ovary. No internal blood flow. No visible septations or evidence of  wall thickening.  Left ovary  Measurements: Surgically absent. No adnexal mass.  Other findings  No free fluid  IMPRESSION:  Multiple uterine fibroids including a 3.8 cm submucosal fibroid.  7.6 cm cyst within the right ovary. Since these may be difficult to  assess completely with [place], further evaluation of simple-appearing  cysts >7 cm with MRI or surgical evaluation is recommended according  to the Society of Radiologists in Ultrasound 2010 Consensus  Conference Statement (D Clovis Riley et al. Management of Asymptomatic  Ovarian and other Adnexal Cysts Imaged at [place]: Society of  Radiologists in [place] Statement 2010.  Radiology 256 (Sept 2010): 503-888.).  Electronically Signed  By: Rolm Baptise M.D.  On: 06/09/2013 11:52   Assessment    Fibroid uterus and menorrhagia and dysmenorrhea     Plan    Lupron Depot 11.25 mg Im and provera 5 mg        ARNOLD,JAMES 10/03/2013, 4:24 PM

## 2013-10-03 NOTE — Patient Instructions (Signed)

## 2013-10-04 NOTE — Telephone Encounter (Signed)
Spoke with the pt and notified of recs per MW  She verbalized understanding  Order was sent to [facility] to schedule ct chest

## 2013-10-10 ENCOUNTER — Other Ambulatory Visit: Payer: Medicaid Other

## 2013-10-10 ENCOUNTER — Ambulatory Visit (INDEPENDENT_AMBULATORY_CARE_PROVIDER_SITE_OTHER): Payer: Medicaid Other | Admitting: General Practice

## 2013-10-10 VITALS — BP 113/70 | HR 83 | Temp 98.1°F | Ht 66.0 in | Wt 271.6 lb

## 2013-10-10 DIAGNOSIS — D259 Leiomyoma of uterus, unspecified: Secondary | ICD-10-CM

## 2013-10-10 MED ORDER — MEDROXYPROGESTERONE ACETATE 5 MG PO TABS: 5.0000 mg | ORAL_TABLET | Freq: Every day | ORAL | Status: DC

## 2013-10-10 MED ORDER — LEUPROLIDE ACETATE (3 MONTH) 11.25 MG IM KIT
11.2500 mg | PACK | Freq: Once | INTRAMUSCULAR | Status: AC
  Administered 2013-10-10: 11.25 mg via INTRAMUSCULAR

## 2013-10-21 ENCOUNTER — Ambulatory Visit: Payer: Medicaid Other | Attending: Internal Medicine | Admitting: Internal Medicine

## 2013-10-21 ENCOUNTER — Encounter: Payer: Self-pay | Admitting: Internal Medicine

## 2013-10-21 ENCOUNTER — Other Ambulatory Visit: Payer: Self-pay

## 2013-10-21 VITALS — BP 125/80 | HR 90 | Temp 98.1°F | Resp 16 | Wt 272.2 lb

## 2013-10-21 DIAGNOSIS — R51 Headache: Secondary | ICD-10-CM | POA: Insufficient documentation

## 2013-10-21 DIAGNOSIS — F17200 Nicotine dependence, unspecified, uncomplicated: Secondary | ICD-10-CM

## 2013-10-21 DIAGNOSIS — J3489 Other specified disorders of nose and nasal sinuses: Secondary | ICD-10-CM | POA: Diagnosis not present

## 2013-10-21 DIAGNOSIS — Z79899 Other long term (current) drug therapy: Secondary | ICD-10-CM | POA: Insufficient documentation

## 2013-10-21 DIAGNOSIS — R0981 Nasal congestion: Secondary | ICD-10-CM

## 2013-10-21 DIAGNOSIS — D649 Anemia, unspecified: Secondary | ICD-10-CM | POA: Diagnosis not present

## 2013-10-21 DIAGNOSIS — R0789 Other chest pain: Secondary | ICD-10-CM | POA: Diagnosis not present

## 2013-10-21 DIAGNOSIS — K219 Gastro-esophageal reflux disease without esophagitis: Secondary | ICD-10-CM | POA: Insufficient documentation

## 2013-10-21 HISTORY — DX: Nicotine dependence, unspecified, uncomplicated: F17.200

## 2013-10-21 MED ORDER — BUTALBITAL-APAP-CAFFEINE 50-325-40 MG PO TABS: 1.0000 | ORAL_TABLET | Freq: Four times a day (QID) | ORAL | Status: DC | PRN

## 2013-10-21 MED ORDER — FLUTICASONE PROPIONATE 50 MCG/ACT NA SUSP: 2.0000 | Freq: Every day | NASAL | Status: DC

## 2013-10-21 NOTE — Progress Notes (Signed)
Patient here for follow up Complains of having a sharp pain in her chest this Early am. Pain has since subsided

## 2013-10-21 NOTE — Progress Notes (Signed)
MRN: [id number] Name: Rebecca Johnston  Sex: female Age: 43 y.o. DOB: [birth year]  Allergies: Review of patient's allergies indicates no known allergies.  Chief Complaint  Patient presents with  . Follow-up    HPI: Patient is 43 y.o. female who comes today for followup reported to have on and off headache for the last 2 weeks which is mostly in the frontal head, sharp, denies any photophobia, patient denies any family history of migraine headaches, she took some Tylenol which helped some, denies any fever chills sore throat or cough, she does complain of the chest pain for which she has been evaluated in the past today EKG in the office is unchanged compared done in February, currently she is being evaluated by her pulmonologist since she has history of abnormal CT chest and is going to repeat soon.vitals are stable. Patient denies any numbness weakness. Patient also history of anemia secondary to menorrhagia and is following up with OB/GYN.   Past Medical History  Diagnosis Date  . Anemia   . Obesity, Class III, BMI 40-49.9 (morbid obesity)   . Fibroids     Past Surgical History  Procedure Laterality Date  . Ovarian cyst removal        Medication List       This list is accurate as of: 10/21/13 10:02 AM.  Always use your most recent med list.               acetaminophen 325 MG tablet  Commonly known as:  TYLENOL  Take 650 mg by mouth every 6 (six) hours as needed.     albuterol 108 (90 BASE) MCG/ACT inhaler  Commonly known as:  PROVENTIL HFA;VENTOLIN HFA  Inhale 2 puffs into the lungs every 6 (six) hours as needed for wheezing or shortness of breath.     butalbital-acetaminophen-caffeine 50-325-40 MG per tablet  Commonly known as:  ESGIC  Take 1 tablet by mouth every 6 (six) hours as needed for headache.     cholecalciferol 1000 UNITS tablet  Commonly known as:  VITAMIN D  Take 1,000 Units by mouth daily.     ferrous sulfate 325 (65 FE) MG tablet  Take 1 tablet  (325 mg total) by mouth 3 (three) times daily with meals.     fluticasone 50 MCG/ACT nasal spray  Commonly known as:  FLONASE  Place 2 sprays into both nostrils daily.     ibuprofen 200 MG tablet  Commonly known as:  ADVIL,MOTRIN  Take 600 mg by mouth every 6 (six) hours as needed for fever, headache or moderate pain.     medroxyPROGESTERone 5 MG tablet  Commonly known as:  PROVERA  Take 1 tablet (5 mg total) by mouth daily.     omeprazole 20 MG capsule  Commonly known as:  PRILOSEC  Take 1 capsule (20 mg total) by mouth daily.        Meds ordered this encounter  Medications  . butalbital-acetaminophen-caffeine (ESGIC) 50-325-40 MG per tablet    Sig: Take 1 tablet by mouth every 6 (six) hours as needed for headache.    Dispense:  30 tablet    Refill:  0  . fluticasone (FLONASE) 50 MCG/ACT nasal spray    Sig: Place 2 sprays into both nostrils daily.    Dispense:  16 g    Refill:  2     There is no immunization history on file for this patient.  Family History  Problem Relation Age of Onset  .  Hypertension Mother   . Diabetes Father   . Breast cancer Maternal Aunt   . Diabetes Maternal Aunt   . Diabetes Paternal Aunt   . Diabetes Maternal Aunt   . Diabetes Maternal Aunt     History  Substance Use Topics  . Smoking status: Current Every Day Smoker -- 0.50 packs/day for 25 years    Types: Cigarettes  . Smokeless tobacco: Never Used  . Alcohol Use: Yes     Comment: socially    Review of Systems   As noted in HPI  Filed Vitals:   10/21/13 0928  BP: 125/80  Pulse: 90  Temp: 98.1 F (36.7 C)  Resp: 16    Physical Exam  Physical Exam  Constitutional: She is oriented to person, place, and time. No distress.  HENT:  Nasal congestion, no sinus tenderness  Eyes: EOM are normal. Pupils are equal, round, and reactive to light.  Cardiovascular: Normal rate and regular rhythm.   Pulmonary/Chest: No respiratory distress. She has no wheezes. She has no  rales.  Musculoskeletal: She exhibits no edema.  Neurological: She is alert and oriented to person, place, and time.    CBC    Component Value Date/Time   WBC 10.3 05/30/2013 0939   RBC 5.81* 05/30/2013 0939   RBC 4.74 04/18/2013 0545   HGB 10.6* 05/30/2013 0939   HCT 34.7* 05/30/2013 0939   PLT 468* 05/30/2013 0939   MCV 59.7* 05/30/2013 0939   LYMPHSABS 1.5 05/30/2013 0939   MONOABS 1.0 05/30/2013 0939   EOSABS 0.2 05/30/2013 0939   BASOSABS 0.0 05/30/2013 0939    CMP     Component Value Date/Time   NA 135 05/30/2013 0939   K 4.3 05/30/2013 0939   CL 100 05/30/2013 0939   CO2 25 05/30/2013 0939   GLUCOSE 112* 05/30/2013 0939   GLUCOSE 79 05/06/2013 1240   BUN 12 05/30/2013 0939   CREATININE 0.93 05/30/2013 0939   CREATININE 0.90 04/27/2013 1228   CALCIUM 9.5 05/30/2013 0939   PROT 8.2 05/30/2013 0939   ALBUMIN 3.8 05/30/2013 0939   AST 23 05/30/2013 0939   ALT 16 05/30/2013 0939   ALKPHOS 134* 05/30/2013 0939   BILITOT 0.4 05/30/2013 0939   GFRNONAA 76 05/30/2013 0939   GFRNONAA 77* 04/27/2013 1228   GFRAA 87 05/30/2013 0939   GFRAA 89* 04/27/2013 1228    Lab Results  Component Value Date/Time   CHOL 145 05/06/2013 12:40 PM    No components found with this basename: hga1c    Lab Results  Component Value Date/Time   AST 23 05/30/2013  9:39 AM    Assessment and Plan  Headache(784.0) - Plan: Trial of butalbital-acetaminophen-caffeine (ESGIC) 50-325-40 MG per tablet  Other chest pain - Plan: EKG 12-Lead unchanged patient to followup with her pulmonologist and is scheduled for repeat CT scan.  Smoking Advised patient to quit smoking.  Gastroesophageal reflux disease, esophagitis presence not specified Continue with Prilosec advised her modification.  Anemia, unspecified anemia type Patient is taking iron supplement and following up with OB/GYN.  Nasal congestion - Plan: fluticasone (FLONASE) 50 MCG/ACT nasal spray  Return in about 3 months (around 01/21/2014).  Lorayne Marek,  MD

## 2013-10-27 ENCOUNTER — Telehealth: Payer: Self-pay | Admitting: Internal Medicine

## 2013-10-27 ENCOUNTER — Telehealth: Payer: Self-pay | Admitting: Emergency Medicine

## 2013-10-27 NOTE — Telephone Encounter (Signed)
Pt called in requesting medication change for migraine headache. States medication isn't working. Informed pt to try OTC Excedrin Migraine. I will route message to Dr. Annitta Needs

## 2013-10-27 NOTE — Telephone Encounter (Signed)
Pt calling to speak to nurse. Please f/u with pt .

## 2013-11-18 ENCOUNTER — Ambulatory Visit (INDEPENDENT_AMBULATORY_CARE_PROVIDER_SITE_OTHER): Payer: Medicaid Other | Admitting: Obstetrics & Gynecology

## 2013-11-18 ENCOUNTER — Encounter: Payer: Self-pay | Admitting: Obstetrics & Gynecology

## 2013-11-18 VITALS — BP 117/76 | HR 86 | Ht 66.0 in | Wt 265.5 lb

## 2013-11-18 DIAGNOSIS — D259 Leiomyoma of uterus, unspecified: Secondary | ICD-10-CM

## 2013-11-18 DIAGNOSIS — N83209 Unspecified ovarian cyst, unspecified side: Secondary | ICD-10-CM | POA: Insufficient documentation

## 2013-11-18 NOTE — Progress Notes (Signed)
Patient ID: Rebecca Johnston, female   DOB: 1971-01-24, 43 y.o.   MRN: [id number] G1P0010 Patient's last menstrual period was 11/04/2013. Only spotting now after Lupron depot given last month and taking provera. Needs f/u US for ovarian cyst.  Recommend hysteroscopy and resection of submucosal fibroid and the procedure and risk was discussed and her questions were answered. Will review repeat US result.  Woodroe Mode, MD 11/18/2013

## 2013-11-18 NOTE — Progress Notes (Signed)
Was given lupron and provera at last visit.

## 2013-11-18 NOTE — Progress Notes (Signed)
Scheduled [place] for September 23rd @ 1400.

## 2013-11-18 NOTE — Patient Instructions (Signed)
Hysteroscopy °Hysteroscopy is a procedure used for looking inside the womb (uterus). It may be done for various reasons, including: °· To evaluate abnormal bleeding, fibroid (benign, noncancerous) tumors, polyps, scar tissue (adhesions), and possibly cancer of the uterus. °· To look for lumps (tumors) and other uterine growths. °· To look for causes of why a woman cannot get pregnant (infertility), causes of recurrent loss of pregnancy (miscarriages), or a lost intrauterine device (IUD). °· To perform a sterilization by blocking the fallopian tubes from inside the uterus. °In this procedure, a thin, flexible tube with a tiny light and camera on the end of it (hysteroscope) is used to look inside the uterus. A hysteroscopy should be done right after a menstrual period to be sure you are not pregnant. °LET YOUR HEALTH CARE PROVIDER KNOW ABOUT:  °· Any allergies you have. °· All medicines you are taking, including vitamins, herbs, eye drops, creams, and over-the-counter medicines. °· Previous problems you or members of your family have had with the use of anesthetics. °· Any blood disorders you have. °· Previous surgeries you have had. °· Medical conditions you have. °RISKS AND COMPLICATIONS  °Generally, this is a safe procedure. However, as with any procedure, complications can occur. Possible complications include: °· Putting a hole in the uterus. °· Excessive bleeding. °· Infection. °· Damage to the cervix. °· Injury to other organs. °· Allergic reaction to medicines. °· Too much fluid used in the uterus for the procedure. °BEFORE THE PROCEDURE  °· Ask your health care provider about changing or stopping any regular medicines. °· Do not take aspirin or blood thinners for 1 week before the procedure, or as directed by your health care provider. These can cause bleeding. °· If you smoke, do not smoke for 2 weeks before the procedure. °· In some cases, a medicine is placed in the cervix the day before the procedure.  This medicine makes the cervix have a larger opening (dilate). This makes it easier for the instrument to be inserted into the uterus during the procedure. °· Do not eat or drink anything for at least 8 hours before the surgery. °· Arrange for someone to take you home after the procedure. °PROCEDURE  °· You may be given a medicine to relax you (sedative). You may also be given one of the following: °¨ A medicine that numbs the area around the cervix (local anesthetic). °¨ A medicine that makes you sleep through the procedure (general anesthetic). °· The hysteroscope is inserted through the vagina into the uterus. The camera on the hysteroscope sends a picture to a TV screen. This gives the surgeon a good view inside the uterus. °· During the procedure, air or a liquid is put into the uterus, which allows the surgeon to see better. °· Sometimes, tissue is gently scraped from inside the uterus. These tissue samples are sent to a lab for testing. °AFTER THE PROCEDURE  °· If you had a general anesthetic, you may be groggy for a couple hours after the procedure. °· If you had a local anesthetic, you will be able to go home as soon as you are stable and feel ready. °· You may have some cramping. This normally lasts for a couple days. °· You may have bleeding, which varies from light spotting for a few days to menstrual-like bleeding for 3-7 days. This is normal. °· If your test results are not back during the visit, make an appointment with your health care provider to find out the   results. Document Released: 05/26/2000 Document Revised: 12/08/2012 Document Reviewed: 09/16/2012 [employer] Patient Information 2015 [employer]. This information is not intended to replace advice given to you by your health care provider. Make sure you discuss any questions you have with your health care provider.   

## 2013-11-23 ENCOUNTER — Ambulatory Visit (HOSPITAL_COMMUNITY)
Admission: RE | Admit: 2013-11-23 | Discharge: 2013-11-23 | Disposition: A | Discharge status: Home / Self Care | Payer: Medicaid Other | Source: Ambulatory Visit | Attending: Obstetrics & Gynecology | Admitting: Obstetrics & Gynecology

## 2013-11-23 DIAGNOSIS — R1903 Right lower quadrant abdominal swelling, mass and lump: Secondary | ICD-10-CM | POA: Diagnosis not present

## 2013-11-23 DIAGNOSIS — D259 Leiomyoma of uterus, unspecified: Secondary | ICD-10-CM | POA: Insufficient documentation

## 2013-11-23 DIAGNOSIS — N83209 Unspecified ovarian cyst, unspecified side: Secondary | ICD-10-CM

## 2013-11-25 ENCOUNTER — Other Ambulatory Visit: Payer: Self-pay | Admitting: Obstetrics & Gynecology

## 2013-12-08 ENCOUNTER — Telehealth: Payer: Self-pay | Admitting: Internal Medicine

## 2013-12-08 ENCOUNTER — Telehealth: Payer: Self-pay | Admitting: *Deleted

## 2013-12-08 DIAGNOSIS — R9389 Abnormal findings on diagnostic imaging of other specified body structures: Secondary | ICD-10-CM

## 2013-12-08 NOTE — Telephone Encounter (Signed)
Rebecca Johnston called and states Dr. Roselie Awkward is her doctor and she wanted to get in touch with him in reference to her surgery- wanted to know if she could go ahead and get any blood worked needed for the surgery so could get the surgery scheduled.  Per chart review surgery has already been scheduled for 01/06/14 with Dr. Roselie Awkward.  Called Rebecca Johnston and notified her surgery has been scheduled for 01/06/14  and she should review a letter any day now notifying her of surgery date/time and other necessary information. I also notified her typically preop will call her the week before her surgery to schedule blood work, etc if  Needed. I verified her address was what we have on file .  She also stated she wanted to know if Dr. Roselie Awkward had reviewed her ultrasound because she said he was concerned about the results of that and that she might need something else done before surgery.  I informed her I did not think he has reviewed it but I would send him a  Note per her request that after he reviews it to let her know if she needs anything else done before surgery or needs to know anything else.

## 2013-12-08 NOTE — Telephone Encounter (Signed)
Called pt. Fast busy signal x 3 wcb

## 2013-12-08 NOTE — Telephone Encounter (Signed)
Spoke with the pt  She wants to have the ct chest scheduled again  She was a no show for last appt  I have sent order to [facility]  Nothing further needed

## 2013-12-15 ENCOUNTER — Ambulatory Visit (INDEPENDENT_AMBULATORY_CARE_PROVIDER_SITE_OTHER)
Admission: RE | Admit: 2013-12-15 | Discharge: 2013-12-15 | Disposition: A | Discharge status: Home / Self Care | Payer: Medicaid Other | Source: Ambulatory Visit | Attending: Internal Medicine | Admitting: Internal Medicine

## 2013-12-15 DIAGNOSIS — R938 Abnormal findings on diagnostic imaging of other specified body structures: Secondary | ICD-10-CM

## 2013-12-15 DIAGNOSIS — R9389 Abnormal findings on diagnostic imaging of other specified body structures: Secondary | ICD-10-CM

## 2013-12-24 ENCOUNTER — Other Ambulatory Visit: Payer: Self-pay | Admitting: Obstetrics & Gynecology

## 2013-12-27 ENCOUNTER — Encounter: Payer: Self-pay | Admitting: Internal Medicine

## 2013-12-27 ENCOUNTER — Telehealth: Payer: Self-pay | Admitting: Internal Medicine

## 2013-12-27 NOTE — Telephone Encounter (Signed)
Pt asking to speak directly with MW  Will forward to him to call the pt

## 2013-12-27 NOTE — Telephone Encounter (Signed)
Discussed with pt, she is feeling fine but still smoking, all lesions smaller or resolved so rec d/c smoking/ f/u prn

## 2013-12-29 ENCOUNTER — Encounter (HOSPITAL_COMMUNITY): Payer: Self-pay | Admitting: Pharmacist

## 2014-01-02 ENCOUNTER — Encounter (HOSPITAL_COMMUNITY)
Admission: RE | Admit: 2014-01-02 | Discharge: 2014-01-02 | Disposition: A | Discharge status: Home / Self Care | Payer: Medicaid Other | Source: Ambulatory Visit | Attending: Internal Medicine | Admitting: Internal Medicine

## 2014-01-02 ENCOUNTER — Encounter (HOSPITAL_COMMUNITY): Payer: Self-pay

## 2014-01-02 DIAGNOSIS — Z01812 Encounter for preprocedural laboratory examination: Secondary | ICD-10-CM | POA: Insufficient documentation

## 2014-01-02 HISTORY — DX: Unspecified asthma, uncomplicated: J45.909

## 2014-01-02 HISTORY — DX: Gastro-esophageal reflux disease without esophagitis: K21.9

## 2014-01-02 HISTORY — DX: Personal history of other medical treatment: Z92.89

## 2014-01-02 LAB — CBC
HCT: 33 % — ABNORMAL LOW (ref 36.0–46.0)
Hemoglobin: 9.9 g/dL — ABNORMAL LOW (ref 12.0–15.0)
MCH: 17.8 pg — AB (ref 26.0–34.0)
MCHC: 30 g/dL (ref 30.0–36.0)
MCV: 59.4 fL — ABNORMAL LOW (ref 78.0–100.0)
PLATELETS: 384 10*3/uL (ref 150–400)
RBC: 5.56 MIL/uL — ABNORMAL HIGH (ref 3.87–5.11)
RDW: 19.8 % — AB (ref 11.5–15.5)
WBC: 8.4 10*3/uL (ref 4.0–10.5)

## 2014-01-02 NOTE — Patient Instructions (Addendum)
Your procedure is scheduled on:01/06/14  Enter through the Main Entrance at :16 am Pick up desk phone and dial [phone] and inform us of your arrival.  Please call [phone] if you have any problems the morning of surgery.  Remember: Do not eat food after midnight:Thursday Clear liquids are ok until:9am   You may brush your teeth the morning of surgery.  Take these meds the morning of surgery with a sip of water: Prilosec  DO NOT wear jewelry, eye make-up, lipstick,body lotion, or dark fingernail polish.  (Polished toes are ok) You may wear deodorant.  If you are to be admitted after surgery, leave suitcase in car until your room has been assigned. Patients discharged on the day of surgery will not be allowed to drive home. Wear loose fitting, comfortable clothes for your ride home.

## 2014-01-06 ENCOUNTER — Ambulatory Visit (HOSPITAL_COMMUNITY)
Admission: RE | Admit: 2014-01-06 | Discharge: 2014-01-06 | Disposition: A | Discharge status: Home / Self Care | Payer: Medicaid Other | Source: Ambulatory Visit | Attending: Obstetrics & Gynecology | Admitting: Obstetrics & Gynecology

## 2014-01-06 ENCOUNTER — Ambulatory Visit (HOSPITAL_COMMUNITY): Payer: Medicaid Other

## 2014-01-06 ENCOUNTER — Encounter (HOSPITAL_COMMUNITY)
Admission: RE | Disposition: A | Discharge status: Home / Self Care | Payer: Self-pay | Source: Ambulatory Visit | Attending: Obstetrics & Gynecology

## 2014-01-06 ENCOUNTER — Encounter (HOSPITAL_COMMUNITY): Payer: Self-pay

## 2014-01-06 DIAGNOSIS — N921 Excessive and frequent menstruation with irregular cycle: Secondary | ICD-10-CM | POA: Insufficient documentation

## 2014-01-06 DIAGNOSIS — N920 Excessive and frequent menstruation with regular cycle: Secondary | ICD-10-CM

## 2014-01-06 DIAGNOSIS — F17210 Nicotine dependence, cigarettes, uncomplicated: Secondary | ICD-10-CM | POA: Diagnosis not present

## 2014-01-06 DIAGNOSIS — Z6841 Body Mass Index (BMI) 40.0 and over, adult: Secondary | ICD-10-CM | POA: Diagnosis not present

## 2014-01-06 DIAGNOSIS — K219 Gastro-esophageal reflux disease without esophagitis: Secondary | ICD-10-CM | POA: Diagnosis not present

## 2014-01-06 DIAGNOSIS — N945 Secondary dysmenorrhea: Secondary | ICD-10-CM | POA: Diagnosis not present

## 2014-01-06 DIAGNOSIS — D649 Anemia, unspecified: Secondary | ICD-10-CM | POA: Insufficient documentation

## 2014-01-06 DIAGNOSIS — D250 Submucous leiomyoma of uterus: Secondary | ICD-10-CM | POA: Diagnosis present

## 2014-01-06 DIAGNOSIS — N946 Dysmenorrhea, unspecified: Secondary | ICD-10-CM

## 2014-01-06 DIAGNOSIS — J45909 Unspecified asthma, uncomplicated: Secondary | ICD-10-CM | POA: Diagnosis not present

## 2014-01-06 HISTORY — PX: DILITATION & CURRETTAGE/HYSTROSCOPY WITH VERSAPOINT RESECTION: SHX5571

## 2014-01-06 LAB — PREGNANCY, URINE: Preg Test, Ur: NEGATIVE

## 2014-01-06 SURGERY — DILATATION & CURETTAGE/HYSTEROSCOPY WITH VERSAPOINT RESECTION
Anesthesia: General | Site: Vagina

## 2014-01-06 SURGERY — Surgical Case
Anesthesia: *Unknown

## 2014-01-06 MED ORDER — MIDAZOLAM HCL 2 MG/2ML IJ SOLN
INTRAMUSCULAR | Status: AC
  Filled 2014-01-06: qty 2

## 2014-01-06 MED ORDER — LIDOCAINE HCL (CARDIAC) 20 MG/ML IV SOLN
INTRAVENOUS | Status: AC
  Filled 2014-01-06: qty 5

## 2014-01-06 MED ORDER — LACTATED RINGERS IV SOLN: INTRAVENOUS | Status: DC

## 2014-01-06 MED ORDER — ONDANSETRON HCL 4 MG/2ML IJ SOLN
INTRAMUSCULAR | Status: AC
  Filled 2014-01-06: qty 2

## 2014-01-06 MED ORDER — MEPERIDINE HCL 25 MG/ML IJ SOLN: 6.2500 mg | INTRAMUSCULAR | Status: DC | PRN

## 2014-01-06 MED ORDER — SCOPOLAMINE 1 MG/3DAYS TD PT72
MEDICATED_PATCH | TRANSDERMAL | Status: AC
  Administered 2014-01-06: 1.5 mg via TRANSDERMAL
  Filled 2014-01-06: qty 1

## 2014-01-06 MED ORDER — LACTATED RINGERS IV SOLN
INTRAVENOUS | Status: DC
  Administered 2014-01-06 (×2): via INTRAVENOUS

## 2014-01-06 MED ORDER — GLYCOPYRROLATE 0.2 MG/ML IJ SOLN
INTRAMUSCULAR | Status: DC | PRN
  Administered 2014-01-06: 0.2 mg via INTRAVENOUS

## 2014-01-06 MED ORDER — FENTANYL CITRATE 0.05 MG/ML IJ SOLN
INTRAMUSCULAR | Status: DC | PRN
  Administered 2014-01-06: 100 ug via INTRAVENOUS
  Administered 2014-01-06 (×4): 50 ug via INTRAVENOUS

## 2014-01-06 MED ORDER — ACETAMINOPHEN-CODEINE #3 300-30 MG PO TABS: 1.0000 | ORAL_TABLET | ORAL | Status: DC | PRN

## 2014-01-06 MED ORDER — PROMETHAZINE HCL 25 MG/ML IJ SOLN: 6.2500 mg | INTRAMUSCULAR | Status: DC | PRN

## 2014-01-06 MED ORDER — FENTANYL CITRATE 0.05 MG/ML IJ SOLN
INTRAMUSCULAR | Status: AC
  Administered 2014-01-06: 50 ug via INTRAVENOUS
  Filled 2014-01-06: qty 2

## 2014-01-06 MED ORDER — PROPOFOL 10 MG/ML IV EMUL
INTRAVENOUS | Status: AC
  Filled 2014-01-06: qty 20

## 2014-01-06 MED ORDER — BUPIVACAINE HCL (PF) 0.25 % IJ SOLN
INTRAMUSCULAR | Status: AC
  Filled 2014-01-06: qty 30

## 2014-01-06 MED ORDER — FENTANYL CITRATE 0.05 MG/ML IJ SOLN
INTRAMUSCULAR | Status: AC
  Filled 2014-01-06: qty 2

## 2014-01-06 MED ORDER — ONDANSETRON HCL 4 MG/2ML IJ SOLN
INTRAMUSCULAR | Status: DC | PRN
  Administered 2014-01-06: 4 mg via INTRAVENOUS

## 2014-01-06 MED ORDER — DEXAMETHASONE SODIUM PHOSPHATE 10 MG/ML IJ SOLN
INTRAMUSCULAR | Status: AC
  Filled 2014-01-06: qty 1

## 2014-01-06 MED ORDER — KETOROLAC TROMETHAMINE 30 MG/ML IJ SOLN
15.0000 mg | Freq: Once | INTRAMUSCULAR | Status: AC | PRN
  Administered 2014-01-06: 30 mg via INTRAVENOUS

## 2014-01-06 MED ORDER — SODIUM CHLORIDE 0.9 % IR SOLN
Status: DC | PRN
  Administered 2014-01-06: 3000 mL

## 2014-01-06 MED ORDER — DEXAMETHASONE SODIUM PHOSPHATE 10 MG/ML IJ SOLN
INTRAMUSCULAR | Status: DC | PRN
  Administered 2014-01-06: 4 mg via INTRAVENOUS

## 2014-01-06 MED ORDER — MIDAZOLAM HCL 2 MG/2ML IJ SOLN
INTRAMUSCULAR | Status: DC | PRN
  Administered 2014-01-06: 2 mg via INTRAVENOUS

## 2014-01-06 MED ORDER — SCOPOLAMINE 1 MG/3DAYS TD PT72
1.0000 | MEDICATED_PATCH | Freq: Once | TRANSDERMAL | Status: DC
  Administered 2014-01-06: 1.5 mg via TRANSDERMAL

## 2014-01-06 MED ORDER — PROPOFOL 10 MG/ML IV BOLUS
INTRAVENOUS | Status: DC | PRN
  Administered 2014-01-06: 200 mg via INTRAVENOUS

## 2014-01-06 MED ORDER — BUPIVACAINE HCL (PF) 0.25 % IJ SOLN
INTRAMUSCULAR | Status: DC | PRN
Start: 2014-01-06 — End: 2014-01-06
  Administered 2014-01-06: 7 mL

## 2014-01-06 MED ORDER — ACETAMINOPHEN 160 MG/5ML PO SOLN
ORAL | Status: AC
  Filled 2014-01-06: qty 40.6

## 2014-01-06 MED ORDER — KETOROLAC TROMETHAMINE 30 MG/ML IJ SOLN
INTRAMUSCULAR | Status: AC
  Filled 2014-01-06: qty 1

## 2014-01-06 MED ORDER — ACETAMINOPHEN-CODEINE #3 300-30 MG PO TABS
ORAL_TABLET | ORAL | Status: AC
  Administered 2014-01-06: 1 via ORAL
  Filled 2014-01-06: qty 1

## 2014-01-06 MED ORDER — ACETAMINOPHEN-CODEINE #3 300-30 MG PO TABS
2.0000 | ORAL_TABLET | ORAL | Status: AC | PRN
  Administered 2014-01-06: 1 via ORAL

## 2014-01-06 MED ORDER — LIDOCAINE HCL (CARDIAC) 20 MG/ML IV SOLN
INTRAVENOUS | Status: DC | PRN
  Administered 2014-01-06: 60 mg via INTRAVENOUS

## 2014-01-06 MED ORDER — FENTANYL CITRATE 0.05 MG/ML IJ SOLN
25.0000 ug | INTRAMUSCULAR | Status: DC | PRN
  Administered 2014-01-06: 50 ug via INTRAVENOUS

## 2014-01-06 SURGICAL SUPPLY — 22 items
CANISTER SUCT 3000ML (MISCELLANEOUS) ×3 IMPLANT
CATH ROBINSON RED A/P 16FR (CATHETERS) ×3 IMPLANT
CLOTH BEACON ORANGE TIMEOUT ST (SAFETY) ×3 IMPLANT
CONTAINER PREFILL 10% NBF 60ML (FORM) ×6 IMPLANT
CORD ACTIVE DISPOSABLE (ELECTRODE) ×2
CORD ELECTRO ACTIVE DISP (ELECTRODE) IMPLANT
ELECT LOOP GYNE PRO 24FR (CUTTING LOOP)
ELECT REM PT RETURN 9FT ADLT (ELECTROSURGICAL) ×3
ELECTRODE LOOP GYNE PRO 24FR (CUTTING LOOP) IMPLANT
ELECTRODE REM PT RTRN 9FT ADLT (ELECTROSURGICAL) ×1 IMPLANT
ELECTRODE RT ANGLE VERSAPOINT (CUTTING LOOP) ×3 IMPLANT
GLOVE BIO SURGEON STRL SZ 6.5 (GLOVE) ×2 IMPLANT
GLOVE BIO SURGEONS STRL SZ 6.5 (GLOVE) ×1
GLOVE BIOGEL PI IND STRL 7.0 (GLOVE) ×1 IMPLANT
GLOVE BIOGEL PI INDICATOR 7.0 (GLOVE) ×2
GOWN STRL REUS W/TWL LRG LVL3 (GOWN DISPOSABLE) ×9 IMPLANT
PACK VAGINAL MINOR WOMEN LF (CUSTOM PROCEDURE TRAY) ×3 IMPLANT
PAD OB MATERNITY 4.3X12.25 (PERSONAL CARE ITEMS) ×3 IMPLANT
SET TUBING HYSTEROSCOPY 2 NDL (TUBING) ×2 IMPLANT
TOWEL OR 17X24 6PK STRL BLUE (TOWEL DISPOSABLE) ×6 IMPLANT
TUBE HYSTEROSCOPY W Y-CONNECT (TUBING) ×2 IMPLANT
WATER STERILE IRR 1000ML POUR (IV SOLUTION) ×3 IMPLANT

## 2014-01-06 NOTE — H&P (Signed)
Rebecca Johnston is an 43 y.o. female. G1P0010 Patient's last menstrual period was 01/06/2014. Scheduled today for hysteroscopy aqnd resection of submucosal fibroid with history of menorrhagia   Last pap: normal Date: 05/2013 OB History:G1P0010    Menstrual History:  Patient's last menstrual period was 01/06/2014.    Past Medical History  Diagnosis Date  . Anemia   . Obesity, Class III, BMI 40-49.9 (morbid obesity)   . Fibroids   . GERD (gastroesophageal reflux disease)   . History of blood transfusion Feb 2015  . Asthma     Past Surgical History  Procedure Laterality Date  . Ovarian cyst removal      Family History  Problem Relation Age of Onset  . Hypertension Mother   . Diabetes Father   . Breast cancer Maternal Aunt   . Diabetes Maternal Aunt   . Diabetes Paternal Aunt   . Diabetes Maternal Aunt   . Diabetes Maternal Aunt     Social History:  reports that she has been smoking Cigarettes.  She has a 6.25 pack-year smoking history. She has never used smokeless tobacco. She reports that she drinks alcohol. She reports that she does not use illicit drugs.  Allergies: No Known Allergies  Facility-administered medications prior to admission  Medication Dose Route Frequency Provider Last Rate Last Dose  . leuprolide (LUPRON) injection 11.25 mg  11.25 mg Intramuscular Once Woodroe Mode, MD       Prescriptions prior to admission  Medication Sig Dispense Refill Last Dose  . acetaminophen (TYLENOL) 325 MG tablet Take 650 mg by mouth every 6 (six) hours as needed.   Past Week at Unknown time  . albuterol (PROVENTIL HFA;VENTOLIN HFA) 108 (90 BASE) MCG/ACT inhaler Inhale 2 puffs into the lungs every 6 (six) hours as needed for wheezing or shortness of breath. 1 Inhaler 3 Past Month at Unknown time  . cholecalciferol (VITAMIN D) 1000 UNITS tablet Take 1,000 Units by mouth daily.   Past Week at Unknown time  . ferrous sulfate 325 (65 FE) MG tablet Take 325 mg by mouth 2 (two)  times daily with a meal.   Past Week at Unknown time  . fluticasone (FLONASE) 50 MCG/ACT nasal spray Place 2 sprays into both nostrils daily. 16 g 2 Past Month at Unknown time  . medroxyPROGESTERone (PROVERA) 5 MG tablet Take 1 tablet (5 mg total) by mouth daily. 30 tablet 6 Past Week at Unknown time  . omeprazole (PRILOSEC) 20 MG capsule Take 1 capsule (20 mg total) by mouth daily. 30 capsule 0 01/06/2014 at 0530    Review of Systems  Constitutional: Negative.   Respiratory: Negative.   Gastrointestinal: Negative.   Genitourinary: Negative.     Blood pressure 112/88, pulse 89, temperature 97.9 F (36.6 C), temperature source Oral, resp. rate 18, last menstrual period 01/06/2014, SpO2 97 %. Physical Exam  Vitals reviewed. Constitutional: She is oriented to person, place, and time. She appears well-developed and well-nourished.  Neck: Normal range of motion.  Cardiovascular: Normal rate.   GI: Soft. She exhibits no mass. There is no tenderness.  Musculoskeletal: Normal range of motion.  Neurological: She is alert and oriented to person, place, and time.  Skin: Skin is warm and dry.  Psychiatric: She has a normal mood and affect. Her behavior is normal.    Results for orders placed or performed during the hospital encounter of 01/06/14 (from the past 24 hour(s))  Pregnancy, urine     Status: None   Collection  Time: 01/06/14  8:12 AM  Result Value Ref Range   Preg Test, Ur NEGATIVE NEGATIVE     CLINICAL DATA: Followup right lower quadrant mass. Spotting.  EXAM: TRANSABDOMINAL ULTRASOUND OF PELVIS  TECHNIQUE: Transabdominal ultrasound examination of the pelvis was performed including evaluation of the uterus, ovaries, adnexal regions, and pelvic cul-de-sac.  COMPARISON: 06/09/2013  FINDINGS: Uterus  Measurements: 14.8 x 7.02 x 7.96. Multiple uterine fibroids are again noted. The largest is in the right side of the uterine fundus measuring 6.3 x 6.1 x 7.1 cm.  Within the lower uterine segment there is a fibroid measuring 5.4 x 5.2 x 5.0 cm.Sub mucosal fibroid within the central uterus measures 2.5 x 2.3 x 2.9 cm.  Endometrium  Thickness: Difficult to visualize secondary to fibroids.  Right ovary  Measurements:Normal ovarian tissue not visualized. Persistent hypoechoic structure . This measures 8.4 x 5.8 x 6.0 cm. On the previous exam this measured 7.8 x 6.2 x 4.9 cm. No internal blood flow or visible septations identified. No mural nodularity identified.  Left ovary  Measurements: Status post left oophorectomy for ruptured ectopic pregnancy.  Other findings: No free fluid  IMPRESSION: 1. Fibroid uterus. 2. Persistent hypoechoic mass/cyst within the right ovary/adnexa. This has been relatively stable since 04/30/2005 which is a reassuring feature and favors a benign etiology. However, this remains indeterminate and if further characterization is clinically indicated a contrast enhanced MRI of the pelvis may provide a more detailed assessment of this indeterminate structure.   Electronically Signed  By: Kerby Moors M.D.  On: 11/23/2013 17:02     Assessment/Plan: Fibroid uterus and menorrhagia with submucosal fibroid. Stable ovarian cyst for years is asymptomatic. Hysteroscopy and resection of fibroroid is scheduled. I answered her questions about expected outcome which is to have normal menstrual flow afterward. Other fibroids will still be in  Place and I believe exploration for the cyst is not warranted. Today's procedure is meant to decrease risk of hysterectomy.The procedure and the risk of anesthesia, bleeding, infection, bowel and bladder injury were discussed and her questions were answered.   ARNOLD,JAMES 01/06/2014, 8:37 AM

## 2014-01-06 NOTE — Transfer of Care (Signed)
Immediate Anesthesia Transfer of Care Note  Patient: Rebecca Johnston  Procedure(s) Performed: Procedure(s): HYSTEROCOPY WITH VERSAPOINT (N/A)  Patient Location: PACU  Anesthesia Type:General  Level of Consciousness: awake, alert  and oriented  Airway & Oxygen Therapy: Patient Spontanous Breathing and Patient connected to nasal cannula oxygen  Post-op Assessment: Report given to PACU RN and Post -op Vital signs reviewed and stable  Post vital signs: Reviewed and stable  Complications: No apparent anesthesia complications

## 2014-01-06 NOTE — Discharge Instructions (Signed)
Hysteroscopy Hysteroscopy is a procedure used for looking inside the womb (uterus). It may be done for various reasons, including:  To evaluate abnormal bleeding, fibroid (benign, noncancerous) tumors, polyps, scar tissue (adhesions), and possibly cancer of the uterus.  To look for lumps (tumors) and other uterine growths.  To look for causes of why a woman cannot get pregnant (infertility), causes of recurrent loss of pregnancy (miscarriages), or a lost intrauterine device (IUD).  To perform a sterilization by blocking the fallopian tubes from inside the uterus. In this procedure, a thin, flexible tube with a tiny light and camera on the end of it (hysteroscope) is used to look inside the uterus. A hysteroscopy should be done right after a menstrual period to be sure you are not pregnant. LET [facility] CARE PROVIDER KNOW ABOUT:   Any allergies you have.  All medicines you are taking, including vitamins, herbs, eye drops, creams, and over-the-counter medicines.  Previous problems you or members of your family have had with the use of anesthetics.  Any blood disorders you have.  Previous surgeries you have had.  Medical conditions you have. RISKS AND COMPLICATIONS  Generally, this is a safe procedure. However, as with any procedure, complications can occur. Possible complications include:  Putting a hole in the uterus.  Excessive bleeding.  Infection.  Damage to the cervix.  Injury to other organs.  Allergic reaction to medicines.  Too much fluid used in the uterus for the procedure. BEFORE THE PROCEDURE   Ask your health care provider about changing or stopping any regular medicines.  Do not take aspirin or blood thinners for 1 week before the procedure, or as directed by your health care provider. These can cause bleeding.  If you smoke, do not smoke for 2 weeks before the procedure.  In some cases, a medicine is placed in the cervix the day before the procedure.  This medicine makes the cervix have a larger opening (dilate). This makes it easier for the instrument to be inserted into the uterus during the procedure.  Do not eat or drink anything for at least 8 hours before the surgery.  Arrange for someone to take you home after the procedure. PROCEDURE   You may be given a medicine to relax you (sedative). You may also be given one of the following:  A medicine that numbs the area around the cervix (local anesthetic).  A medicine that makes you sleep through the procedure (general anesthetic).  The hysteroscope is inserted through the vagina into the uterus. The camera on the hysteroscope sends a picture to a TV screen. This gives the surgeon a good view inside the uterus.  During the procedure, air or a liquid is put into the uterus, which allows the surgeon to see better.  Sometimes, tissue is gently scraped from inside the uterus. These tissue samples are sent to a lab for testing. AFTER THE PROCEDURE   If you had a general anesthetic, you may be groggy for a couple hours after the procedure.  If you had a local anesthetic, you will be able to go home as soon as you are stable and feel ready.  You may have some cramping. This normally lasts for a couple days.  You may have bleeding, which varies from light spotting for a few days to menstrual-like bleeding for 3-7 days. This is normal.  If your test results are not back during the visit, make an appointment with your health care provider to find out the  results. °Document Released: 05/26/2000 Document Revised: 12/08/2012 Document Reviewed: 09/16/2012 °ExitCare® Patient Information ©2015 [employer]. This information is not intended to replace advice given to you by your health care provider. Make sure you discuss any questions you have with your health care provider. °DISCHARGE INSTRUCTIONS: D&C / D&E °The following instructions have been prepared to help you care for yourself upon your  return home. °  °Personal hygiene: °• Use sanitary pads for vaginal drainage, not tampons. °• Shower the day after your procedure. °• NO tub baths, pools or Jacuzzis for 2-3 weeks. °• Wipe front to back after using the bathroom. ° °Activity and limitations: °• Do NOT drive or operate any equipment for 24 hours. The effects of anesthesia are still present and drowsiness may result. °• Do NOT rest in bed all day. °• Walking is encouraged. °• Walk up and down stairs slowly. °• You may resume your normal activity in one to two days or as indicated by your physician. ° °Sexual activity: NO intercourse for at least 2 weeks after the procedure, or as indicated by your physician. ° °Diet: Eat a light meal as desired this evening. You may resume your usual diet tomorrow. ° °Return to work: You may resume your work activities in one to two days or as indicated by your doctor. ° °What to expect after your surgery: Expect to have vaginal bleeding/discharge for 2-3 days and spotting for up to 10 days. It is not unusual to have soreness for up to 1-2 weeks. You may have a slight burning sensation when you urinate for the first day. Mild cramps may continue for a couple of days. You may have a regular period in 2-6 weeks. ° °Call your doctor for any of the following: °• Excessive vaginal bleeding, saturating and changing one pad every hour. °• Inability to urinate 6 hours after discharge from hospital. °• Pain not relieved by pain medication. °• Fever of 100.4° F or greater. °• Unusual vaginal discharge or odor. ° ° Call for an appointment:  ° ° °Patient’s signature: ______________________ ° °Nurse’s signature ________________________ ° °Support person's signature_______________________ ° ° ° °

## 2014-01-06 NOTE — Anesthesia Preprocedure Evaluation (Signed)
Anesthesia Evaluation  Patient identified by MRN, date of birth, ID band Patient awake    Reviewed: Allergy & Precautions, H&P , NPO status , Patient's Chart, lab work & pertinent test results, reviewed documented beta blocker date and time   History of Anesthesia Complications Negative for: history of anesthetic complications  Airway Mallampati: I  TM Distance: >3 FB Neck ROM: full    Dental  (+) Teeth Intact   Pulmonary asthma (uses inhaler about once a month for SOB) , Current Smoker,  breath sounds clear to auscultation  Pulmonary exam normal       Cardiovascular Rhythm:regular Rate:Normal  EKG, echo in 2/15 at [facility] for chest pain - per pt normal   Neuro/Psych negative neurological ROS  negative psych ROS   GI/Hepatic Neg liver ROS, GERD-  Medicated,  Endo/Other  Morbid obesity  Renal/GU negative Renal ROS  Female GU complaint     Musculoskeletal   Abdominal   Peds  Hematology  (+) anemia , H/o blood transfusion 2015 for anemia from menorrhagia   Anesthesia Other Findings   Reproductive/Obstetrics negative OB ROS                             Anesthesia Physical Anesthesia Plan  ASA: III  Anesthesia Plan: General LMA   Post-op Pain Management:    Induction:   Airway Management Planned:   Additional Equipment:   Intra-op Plan:   Post-operative Plan:   Informed Consent: I have reviewed the patients History and Physical, chart, labs and discussed the procedure including the risks, benefits and alternatives for the proposed anesthesia with the patient or authorized representative who has indicated his/her understanding and acceptance.   Dental Advisory Given  Plan Discussed with: CRNA and Surgeon  Anesthesia Plan Comments:         Anesthesia Quick Evaluation

## 2014-01-06 NOTE — Anesthesia Postprocedure Evaluation (Signed)
  Anesthesia Post-op Note  Anesthesia Post Note  Patient: Rebecca Johnston  Procedure(s) Performed: Procedure(s) (LRB): HYSTEROCOPY WITH VERSAPOINT (N/A)  Anesthesia type: General  Patient location: PACU  Post pain: Pain level controlled  Post assessment: Post-op Vital signs reviewed  Last Vitals:  Filed Vitals:   01/06/14 1245  BP:   Pulse: 86  Temp:   Resp: 21    Post vital signs: Reviewed  Level of consciousness: sedated  Complications: No apparent anesthesia complications

## 2014-01-06 NOTE — Addendum Note (Signed)
Addendum  created 01/06/14 1317 by Lenox Ponds, CRNA   Modules edited: Anesthesia Flowsheet

## 2014-01-06 NOTE — Op Note (Signed)
Procedure: Hysteroscopy and resection of submucosal fibroid Preoperative diagnosis: Fibroid uterus with submucosal fibroid and menorrhagia Postoperative diagnosis: Same Surgeon: Dr. Emeterio Reeve Anesthesia: LMA Estimated blood loss: 75 mL Specimen: Fragments of resected fibroid Locations: None Drains: None Counts: Correct  Patient gave written consent for hysteroscopy and resection of a submucosal fibroid. Patient identification was confirmed and she was brought to the operating room and LMA was induced. She was placed in dorsal lithotomy position. Exam under anesthesia was limited due to morbid obesity but uterus appeared to be 8-10 weeks size no adnexal masses were palpated. After perineum and vagina were sterilely prepped and draped in the bladder was drained speculum was inserted and cervix was visualized. Court percent Marcaine was used to infiltrate cervix for intracervical block. The cervix was grasped with single-tooth tenaculum. Cervix was dilated until related sufficiently to pass the resectoscope. VersaPoint was used. Video camera was used. This resectoscope was inserted and just superior to the internal os a uterine fibroid about 4-5 cm was seen. Appeared to be arising from the posterior surface. The cutting loop was used to resect the fibroid and to retrieve the fragments. Because of her obesity the scope could not be advanced very far past the internal os. Near complete resection of the fibroid was assured and a curette was used to remove remaining fragments at the end of the procedure. There was a mild to moderate amount of uterine bleeding at the end of the procedure. Did not appear to be excessive. Allis meds were removed. Specimen sent to pathology. She was brought in stable condition to PACU.  Dr. Emeterio Reeve 01/06/2014 11:34 AM

## 2014-01-06 NOTE — Anesthesia Procedure Notes (Signed)
Procedure Name: LMA Insertion Date/Time: 01/06/2014 9:13 AM Performed by: Woodroe Mode Pre-anesthesia Checklist: Patient identified, Emergency Drugs available, Suction available, Patient being monitored and Timeout performed Patient Re-evaluated:Patient Re-evaluated prior to inductionOxygen Delivery Method: Circle system utilized Preoxygenation: Pre-oxygenation with 100% oxygen Intubation Type: IV induction Ventilation: Mask ventilation without difficulty LMA: LMA with gastric port inserted LMA Size: 4.0 Dental Injury: Teeth and Oropharynx as per pre-operative assessment

## 2014-01-09 ENCOUNTER — Encounter (HOSPITAL_COMMUNITY): Payer: Self-pay | Admitting: Obstetrics & Gynecology

## 2014-04-24 ENCOUNTER — Telehealth: Payer: Self-pay | Admitting: *Deleted

## 2014-04-24 NOTE — Telephone Encounter (Signed)
Patient called and left message stating that she needs a nurse to call her back about her surgery that was done in November. I called patient back and she stated that she started bleeding again last night. It is heavy but she is not saturating pads. I advised that since she never had a followup that we get her scheduled next available to followup (message sent to front desk) Advised patient that if bleeding becomes severe or she is saturating a pad in less than an hour that she should go to MAU. Patient is agreeable.

## 2014-05-01 ENCOUNTER — Encounter: Payer: Self-pay | Admitting: Obstetrics & Gynecology

## 2014-05-01 ENCOUNTER — Ambulatory Visit (INDEPENDENT_AMBULATORY_CARE_PROVIDER_SITE_OTHER): Payer: Medicaid Other | Admitting: Obstetrics & Gynecology

## 2014-05-01 VITALS — BP 120/86 | HR 99 | Ht 66.0 in | Wt 277.3 lb

## 2014-05-01 DIAGNOSIS — D259 Leiomyoma of uterus, unspecified: Secondary | ICD-10-CM | POA: Diagnosis not present

## 2014-05-01 NOTE — Progress Notes (Signed)
Subjective:     Patient ID: Rebecca Johnston, female   DOB: Apr 25, 1970, 44 y.o.   MRN: [id number]  HPI Patient's last menstrual period was 04/23/2014 (exact date). G1P0010 No menses after myomectomy 01/2004 until 2/21 which lasted 7 days and she says was heavy   Review of Systems  Constitutional: Negative.   Genitourinary: Positive for menstrual problem. Negative for vaginal discharge and pelvic pain.       Objective:   Physical Exam  Constitutional: She is oriented to person, place, and time. No distress.  Abdominal: Soft. She exhibits no mass.  Genitourinary: Vagina normal. No vaginal discharge found.  Uterus 8-10 weeks no mass palpable  Neurological: She is alert and oriented to person, place, and time.  Skin: Skin is warm and dry.  Psychiatric: She has a normal mood and affect. Her behavior is normal.  Vitals reviewed. Blood pressure 120/86, pulse 99, height 5\' 6"  (1.676 m), weight 277 lb 4.8 oz (125.782 kg), last menstrual period 04/23/2014.      Assessment:     Uterine fibroids h/o menorrhagia     Plan:     Menstrual calendar, [place] and f/u 4 mo  Woodroe Mode, MD 05/01/2014

## 2014-05-01 NOTE — Patient Instructions (Signed)

## 2014-05-17 ENCOUNTER — Other Ambulatory Visit: Payer: Self-pay | Admitting: Obstetrics & Gynecology

## 2014-05-17 DIAGNOSIS — Z1231 Encounter for screening mammogram for malignant neoplasm of breast: Secondary | ICD-10-CM

## 2014-07-04 ENCOUNTER — Ambulatory Visit (HOSPITAL_COMMUNITY)
Admission: RE | Admit: 2014-07-04 | Discharge: 2014-07-04 | Disposition: A | Discharge status: Home / Self Care | Payer: Medicaid Other | Source: Ambulatory Visit | Attending: Obstetrics & Gynecology | Admitting: Obstetrics & Gynecology

## 2014-07-04 DIAGNOSIS — D259 Leiomyoma of uterus, unspecified: Secondary | ICD-10-CM

## 2014-07-04 DIAGNOSIS — D251 Intramural leiomyoma of uterus: Secondary | ICD-10-CM | POA: Diagnosis not present

## 2014-07-04 DIAGNOSIS — Z1231 Encounter for screening mammogram for malignant neoplasm of breast: Secondary | ICD-10-CM | POA: Diagnosis not present

## 2014-07-04 DIAGNOSIS — D252 Subserosal leiomyoma of uterus: Secondary | ICD-10-CM | POA: Insufficient documentation

## 2014-07-04 DIAGNOSIS — N920 Excessive and frequent menstruation with regular cycle: Secondary | ICD-10-CM | POA: Insufficient documentation

## 2014-07-20 ENCOUNTER — Telehealth: Payer: Self-pay

## 2014-07-20 NOTE — Telephone Encounter (Signed)
Patient called requesting results from pelvic U/S and to schedule f/u. Called patient and informed her of recent results-- informed her that results will be discussed in more detail and plan re-evaluated at f.u visit in June per request of Dr. Roselie Awkward. Informed her front office staff will be contacting her with appointment and advised that she call by Wednesday if she has not heard from them. Message sent to admin pool.

## 2014-08-23 ENCOUNTER — Encounter: Payer: Self-pay | Admitting: Obstetrics & Gynecology

## 2014-08-23 ENCOUNTER — Ambulatory Visit (INDEPENDENT_AMBULATORY_CARE_PROVIDER_SITE_OTHER): Payer: Medicaid Other | Admitting: Obstetrics & Gynecology

## 2014-08-23 VITALS — BP 117/74 | HR 101 | Temp 98.6°F | Ht 66.0 in | Wt 276.8 lb

## 2014-08-23 DIAGNOSIS — D259 Leiomyoma of uterus, unspecified: Secondary | ICD-10-CM

## 2014-08-23 MED ORDER — MEDROXYPROGESTERONE ACETATE 10 MG PO TABS: 20.0000 mg | ORAL_TABLET | Freq: Every day | ORAL | Status: DC

## 2014-08-23 NOTE — Progress Notes (Signed)
Patient ID: Rebecca Johnston, female   DOB: [birth year], 44 y.o.   MRN: [id number]  Chief Complaint  Patient presents with  . Follow-up  heavy period and cramps  HPI Rebecca Johnston is a 44 y.o. female.  G1P0010 Patient's last menstrual period was 08/11/2014.   HPImenses regular but heavy since march, same as before her hysteroscopic myomectomy, wants definitive therapy and will consider hysterectomy, and would like her right ovary removed due to persistent cyst  Past Medical History  Diagnosis Date  . Anemia   . Obesity, Class III, BMI 40-49.9 (morbid obesity)   . Fibroids   . GERD (gastroesophageal reflux disease)   . History of blood transfusion Feb 2015  . Asthma     Past Surgical History  Procedure Laterality Date  . Ovarian cyst removal    . Dilitation & currettage/hystroscopy with versapoint resection N/A 01/06/2014    Procedure: HYSTEROCOPY WITH VERSAPOINT;  Surgeon: Woodroe Mode, MD;  Location: [facility];  Service: Gynecology;  Laterality: N/A;    Family History  Problem Relation Age of Onset  . Hypertension Mother   . Diabetes Father   . Breast cancer Maternal Aunt   . Diabetes Maternal Aunt   . Diabetes Paternal Aunt   . Diabetes Maternal Aunt   . Diabetes Maternal Aunt     Social History History  Substance Use Topics  . Smoking status: Current Every Day Smoker -- 0.25 packs/day for 25 years    Types: Cigarettes  . Smokeless tobacco: Never Used  . Alcohol Use: Yes     Comment: socially    No Known Allergies  Current Outpatient Prescriptions  Medication Sig Dispense Refill  . acetaminophen (TYLENOL) 325 MG tablet Take 650 mg by mouth every 6 (six) hours as needed.    Marland Kitchen albuterol (PROVENTIL HFA;VENTOLIN HFA) 108 (90 BASE) MCG/ACT inhaler Inhale 2 puffs into the lungs every 6 (six) hours as needed for wheezing or shortness of breath. 1 Inhaler 3  . cholecalciferol (VITAMIN D) 1000 UNITS tablet Take 1,000 Units by mouth daily.    . ferrous sulfate 325 (65 FE)  MG tablet Take 325 mg by mouth 2 (two) times daily with a meal.    . fluticasone (FLONASE) 50 MCG/ACT nasal spray Place 2 sprays into both nostrils daily. 16 g 2  . omeprazole (PRILOSEC) 20 MG capsule Take 1 capsule (20 mg total) by mouth daily. 30 capsule 0  . medroxyPROGESTERone (PROVERA) 10 MG tablet Take 2 tablets (20 mg total) by mouth daily. 30 tablet 4   No current facility-administered medications for this visit.    Review of Systems Review of Systems  Constitutional: Negative.   Respiratory: Negative.   Gastrointestinal: Negative.   Genitourinary: Positive for vaginal bleeding and menstrual problem. Negative for urgency.    Blood pressure 117/74, pulse 101, temperature 98.6 F (37 C), height 5\' 6"  (1.676 m), weight 276 lb 12.8 oz (125.556 kg), last menstrual period 08/11/2014.  Physical Exam Physical Exam  Constitutional: She is oriented to person, place, and time. She appears well-developed. No distress.  Cardiovascular: Normal rate.   Pulmonary/Chest: Effort normal. No respiratory distress.  Abdominal: Soft. There is no tenderness.  Obese, fullness possible ventral incisional hernia above umbilicus, soft NT  Neurological: She is alert and oriented to person, place, and time.  Skin: Skin is warm and dry.  Psychiatric: She has a normal mood and affect. Her behavior is normal.    Data Reviewed [place], path reports, surgical history  Assessment    Fibroid uterus, menorrhagia that did not respond to myomectomy. Possible ventral hernia at L/S incision     Plan    Discussed definitive therapy for menorrhagia with hysterectomy, RSO for ovarian cyst. W/u for possible hernia with CT abd and pelvis  Start provera 20 mg po daily  RTC 4 weeks        ARNOLD,JAMES 08/23/2014, 4:33 PM

## 2014-08-23 NOTE — Patient Instructions (Signed)
Hysterectomy Information  A hysterectomy is a surgery in which your uterus is removed. This surgery may be done to treat various medical problems. After the surgery, you will no longer have menstrual periods. The surgery will also make you unable to become pregnant (sterile). The fallopian tubes and ovaries can be removed (bilateral salpingo-oophorectomy) during this surgery as well.  REASONS FOR A HYSTERECTOMY  Persistent, abnormal bleeding.  Lasting (chronic) pelvic pain or infection.  The lining of the uterus (endometrium) starts growing outside the uterus (endometriosis).  The endometrium starts growing in the muscle of the uterus (adenomyosis).  The uterus falls down into the vagina (pelvic organ prolapse).  Noncancerous growths in the uterus (uterine fibroids) that cause symptoms.  Precancerous cells.  Cervical cancer or uterine cancer. TYPES OF HYSTERECTOMIES  Supracervical hysterectomy--In this type, the top part of the uterus is removed, but not the cervix.  Total hysterectomy--The uterus and cervix are removed.  Radical hysterectomy--The uterus, the cervix, and the fibrous tissue that holds the uterus in place in the pelvis (parametrium) are removed. WAYS A HYSTERECTOMY CAN BE PERFORMED  Abdominal hysterectomy--A large surgical cut (incision) is made in the abdomen. The uterus is removed through this incision.  Vaginal hysterectomy--An incision is made in the vagina. The uterus is removed through this incision. There are no abdominal incisions.  Conventional laparoscopic hysterectomy--Three or four small incisions are made in the abdomen. A thin, lighted tube with a camera (laparoscope) is inserted into one of the incisions. Other tools are put through the other incisions. The uterus is cut into small pieces. The small pieces are removed through the incisions, or they are removed through the vagina.  Laparoscopically assisted vaginal hysterectomy (LAVH)--Three or four  small incisions are made in the abdomen. Part of the surgery is performed laparoscopically and part vaginally. The uterus is removed through the vagina.  Robot-assisted laparoscopic hysterectomy--A laparoscope and other tools are inserted into 3 or 4 small incisions in the abdomen. A computer-controlled device is used to give the surgeon a 3D image and to help control the surgical instruments. This allows for more precise movements of surgical instruments. The uterus is cut into small pieces and removed through the incisions or removed through the vagina. RISKS AND COMPLICATIONS  Possible complications associated with this procedure include:  Bleeding and risk of blood transfusion. Tell your health care provider if you do not want to receive any blood products.  Blood clots in the legs or lung.  Infection.  Injury to surrounding organs.  Problems or side effects related to anesthesia.  Conversion to an abdominal hysterectomy from one of the other techniques. WHAT TO EXPECT AFTER A HYSTERECTOMY  You will be given pain medicine.  You will need to have someone with you for the first 3-5 days after you go home.  You will need to follow up with your surgeon in 2-4 weeks after surgery to evaluate your progress.  You may have early menopause symptoms such as hot flashes, night sweats, and insomnia.  If you had a hysterectomy for a problem that was not cancer or not a condition that could lead to cancer, then you no longer need Pap tests. However, even if you no longer need a Pap test, a regular exam is a good idea to make sure no other problems are starting. Document Released: 08/13/2000 Document Revised: 12/08/2012 Document Reviewed: 10/25/2012 [facility] Patient Information 2015 [place], [place]. This information is not intended to replace advice given to you by your health care  provider. Make sure you discuss any questions you have with your health care provider.

## 2014-08-29 ENCOUNTER — Ambulatory Visit (HOSPITAL_COMMUNITY)
Admission: RE | Admit: 2014-08-29 | Discharge: 2014-08-29 | Disposition: A | Discharge status: Home / Self Care | Payer: Medicaid Other | Source: Ambulatory Visit | Attending: Obstetrics & Gynecology | Admitting: Obstetrics & Gynecology

## 2014-08-29 DIAGNOSIS — N920 Excessive and frequent menstruation with regular cycle: Secondary | ICD-10-CM | POA: Diagnosis present

## 2014-08-29 DIAGNOSIS — K439 Ventral hernia without obstruction or gangrene: Secondary | ICD-10-CM | POA: Insufficient documentation

## 2014-08-29 DIAGNOSIS — D259 Leiomyoma of uterus, unspecified: Secondary | ICD-10-CM | POA: Insufficient documentation

## 2014-08-29 MED ORDER — IOHEXOL 300 MG/ML  SOLN
100.0000 mL | Freq: Once | INTRAMUSCULAR | Status: AC | PRN
  Administered 2014-08-29: 100 mL via INTRAVENOUS

## 2014-08-30 ENCOUNTER — Telehealth: Payer: Self-pay | Admitting: General Practice

## 2014-08-30 NOTE — Telephone Encounter (Signed)
Patient called and left message stating she is wanting the results from her CT scan. Called patient and informed her of CT results similar to previous ultrasound. Informed patient that cyst has decreased slightly in size. Patient verbalized understanding and asked if it showed a hernia. Told patient it did. Patient asked about next steps. Told patient that is something that will be discussed at her next visit. If surgery, the surgery will be discussed at length and any of her questions answered at that time. Patient asked if the hernia was a problem. Told patient no, usually hernia's do not cause issues and aren't life threatening. Patient verbalized understanding and had no other questions

## 2014-09-20 ENCOUNTER — Encounter: Payer: Self-pay | Admitting: Obstetrics & Gynecology

## 2014-09-20 ENCOUNTER — Ambulatory Visit (INDEPENDENT_AMBULATORY_CARE_PROVIDER_SITE_OTHER): Payer: Medicaid Other | Admitting: Obstetrics & Gynecology

## 2014-09-20 VITALS — BP 118/67 | HR 83 | Temp 98.8°F | Ht 66.0 in | Wt 278.3 lb

## 2014-09-20 DIAGNOSIS — K439 Ventral hernia without obstruction or gangrene: Secondary | ICD-10-CM | POA: Insufficient documentation

## 2014-09-20 DIAGNOSIS — D259 Leiomyoma of uterus, unspecified: Secondary | ICD-10-CM

## 2014-09-20 NOTE — Progress Notes (Signed)
Unable to make referral to general surgery because patient has Medicaid [place] access with [facility]. Instructed patient to contact [facility] and ask for referral, we discussed she may need to make appointment to be seen by [facility] first. Will also place referral order in EPIC for [facility].

## 2014-09-20 NOTE — Addendum Note (Signed)
Addended by: Samuel Germany on: 09/20/2014 05:20 PM   Modules accepted: Orders

## 2014-09-20 NOTE — Progress Notes (Signed)
Patient ID: Rebecca Johnston, female   DOB: Sep 12, 1970, 44 y.o.   MRN: [id number]  Chief Complaint  Patient presents with  . Follow-up  menorrhagia, had CT to evaluate hernia  HPI Rebecca Johnston is a 44 y.o. female.  [id number] Patient's last menstrual period was 09/05/2014. Heavy menses and fibroids, wants to schedule hysterectomy and removal of right ovarian mass  HPI  Past Medical History  Diagnosis Date  . Anemia   . Obesity, Class III, BMI 40-49.9 (morbid obesity)   . Fibroids   . GERD (gastroesophageal reflux disease)   . History of blood transfusion Feb 2015  . Asthma     Past Surgical History  Procedure Laterality Date  . Ovarian cyst removal    . Dilitation & currettage/hystroscopy with versapoint resection N/A 01/06/2014    Procedure: HYSTEROCOPY WITH VERSAPOINT;  Surgeon: Woodroe Mode, MD;  Location: [facility];  Service: Gynecology;  Laterality: N/A;    Family History  Problem Relation Age of Onset  . Hypertension Mother   . Diabetes Father   . Breast cancer Maternal Aunt   . Diabetes Maternal Aunt   . Diabetes Paternal Aunt   . Diabetes Maternal Aunt   . Diabetes Maternal Aunt     Social History History  Substance Use Topics  . Smoking status: Current Every Day Smoker -- 0.25 packs/day for 25 years    Types: Cigarettes  . Smokeless tobacco: Never Used  . Alcohol Use: Yes     Comment: socially    No Known Allergies  Current Outpatient Prescriptions  Medication Sig Dispense Refill  . acetaminophen (TYLENOL) 325 MG tablet Take 650 mg by mouth every 6 (six) hours as needed.    Marland Kitchen albuterol (PROVENTIL HFA;VENTOLIN HFA) 108 (90 BASE) MCG/ACT inhaler Inhale 2 puffs into the lungs every 6 (six) hours as needed for wheezing or shortness of breath. 1 Inhaler 3  . cholecalciferol (VITAMIN D) 1000 UNITS tablet Take 1,000 Units by mouth daily.    . ferrous sulfate 325 (65 FE) MG tablet Take 325 mg by mouth 2 (two) times daily with a meal.    . fluticasone (FLONASE) 50  MCG/ACT nasal spray Place 2 sprays into both nostrils daily. 16 g 2  . medroxyPROGESTERone (PROVERA) 10 MG tablet Take 2 tablets (20 mg total) by mouth daily. 30 tablet 4  . omeprazole (PRILOSEC) 20 MG capsule Take 1 capsule (20 mg total) by mouth daily. 30 capsule 0   No current facility-administered medications for this visit.    Review of Systems Review of Systems  Gastrointestinal: Positive for abdominal distention (upper abdomen).  Genitourinary: Positive for menstrual problem and pelvic pain. Negative for vaginal bleeding and vaginal discharge.    Blood pressure 118/67, pulse 83, temperature 98.8 F (37.1 C), height 5\' 6"  (1.676 m), weight 278 lb 4.8 oz (126.236 kg), last menstrual period 09/05/2014.  Physical Exam Physical Exam  Constitutional: She appears well-developed. No distress.  Pulmonary/Chest: Effort normal.  Abdominal: She exhibits distension (ventral upper abd hernia).  Neurological: She is alert.  Skin: Skin is warm and dry.  Psychiatric: She has a normal mood and affect. Her behavior is normal.    Data Reviewed CLINICAL DATA: Uterine fibroid, heavy menstrual bleeding. Possible ventral hernia.  EXAM: CT ABDOMEN AND PELVIS WITH CONTRAST  TECHNIQUE: Multidetector CT imaging of the abdomen and pelvis was performed using the standard protocol following bolus administration of intravenous contrast.  CONTRAST: 128mL OMNIPAQUE IOHEXOL 300 MG/ML SOLN  COMPARISON: Ultrasound 07/04/2014  FINDINGS: Lower chest: Lung bases are clear. No effusions. Heart is normal size.  Hepatobiliary: No biliary ductal dilatation or focal hepatic lesion. Gallbladder grossly unremarkable.  Pancreas: No focal abnormality or ductal dilatation.  Spleen: No focal abnormality. Normal size.  Adrenals/Urinary Tract: No focal renal or adrenal abnormality. No hydronephrosis. Urinary bladder is decompressed, unremarkable.  Stomach/Bowel: Stomach, large and small  bowel grossly unremarkable.  Vascular/Lymphatic: No retroperitoneal or mesenteric adenopathy. Aorta normal caliber.  Reproductive: Uterus is enlarged with at least 2 uterine fibroids as described on ultrasound. The largest in the fundus measures up to 9.5 cm. Left lower uterine segment fibroid measures up to 7.3 cm. 6.2 cm cystic structure noted in the right ovary. This measures 6.9 cm on prior ultrasound.  Other: No free fluid or free air. There is a supraumbilical midline ventral hernia containing fat. This measures approximately 6.3 x 2.6 cm  Musculoskeletal: No focal bone lesion or acute bony abnormality.  IMPRESSION: Supraumbilical ventral hernia containing fat.  Enlarged fibroid uterus.  6.2 cm cystic mass within the right ovary, previously measuring 6.9 cm by ultrasound. This was felt by ultrasound to possibly represent endometrioma. This was better characterized by ultrasound and can be followed with ultrasound.   Electronically Signed  By: Rolm Baptise M.D.  On: 08/29/2014 09:48  Assessment    SX fibroids and menorrhagia s/p myomectomy Sx ventral hernia Obese     Plan    Schedule TAH/RSO and request genl surgery consult, needs to see her PCP for referral        ARNOLD,JAMES 09/20/2014, 3:49 PM

## 2014-09-20 NOTE — Patient Instructions (Signed)
Hernia A hernia occurs when an internal organ pushes out through a weak spot in the abdominal wall. Hernias most commonly occur in the groin and around the navel. Hernias often can be pushed back into place (reduced). Most hernias tend to get worse over time. Some abdominal hernias can get stuck in the opening (irreducible or incarcerated hernia) and cannot be reduced. An irreducible abdominal hernia which is tightly squeezed into the opening is at risk for impaired blood supply (strangulated hernia). A strangulated hernia is a medical emergency. Because of the risk for an irreducible or strangulated hernia, surgery may be recommended to repair a hernia. CAUSES   Heavy lifting.  Prolonged coughing.  Straining to have a bowel movement.  A cut (incision) made during an abdominal surgery. HOME CARE INSTRUCTIONS   Bed rest is not required. You may continue your normal activities.  Avoid lifting more than 10 pounds (4.5 kg) or straining.  Cough gently. If you are a smoker it is best to stop. Even the best hernia repair can break down with the continual strain of coughing. Even if you do not have your hernia repaired, a cough will continue to aggravate the problem.  Do not wear anything tight over your hernia. Do not try to keep it in with an outside bandage or truss. These can damage abdominal contents if they are trapped within the hernia sac.  Eat a normal diet.  Avoid constipation. Straining over long periods of time will increase hernia size and encourage breakdown of repairs. If you cannot do this with diet alone, stool softeners may be used. SEEK IMMEDIATE MEDICAL CARE IF:   You have a fever.  You develop increasing abdominal pain.  You feel nauseous or vomit.  Your hernia is stuck outside the abdomen, looks discolored, feels hard, or is tender.  You have any changes in your bowel habits or in the hernia that are unusual for you.  You have increased pain or swelling around the  hernia.  You cannot push the hernia back in place by applying gentle pressure while lying down. MAKE SURE YOU:   Understand these instructions.  Will watch your condition.  Will get help right away if you are not doing well or get worse. Document Released: 02/17/2005 Document Revised: 05/12/2011 Document Reviewed: 10/07/2007 [facility] Patient Information 2015 [place], [place]. This information is not intended to replace advice given to you by your health care provider. Make sure you discuss any questions you have with your health care provider. Hysterectomy Information  A hysterectomy is a surgery in which your uterus is removed. This surgery may be done to treat various medical problems. After the surgery, you will no longer have menstrual periods. The surgery will also make you unable to become pregnant (sterile). The fallopian tubes and ovaries can be removed (bilateral salpingo-oophorectomy) during this surgery as well.  REASONS FOR A HYSTERECTOMY  Persistent, abnormal bleeding.  Lasting (chronic) pelvic pain or infection.  The lining of the uterus (endometrium) starts growing outside the uterus (endometriosis).  The endometrium starts growing in the muscle of the uterus (adenomyosis).  The uterus falls down into the vagina (pelvic organ prolapse).  Noncancerous growths in the uterus (uterine fibroids) that cause symptoms.  Precancerous cells.  Cervical cancer or uterine cancer. TYPES OF HYSTERECTOMIES  Supracervical hysterectomy--In this type, the top part of the uterus is removed, but not the cervix.  Total hysterectomy--The uterus and cervix are removed.  Radical hysterectomy--The uterus, the cervix, and the fibrous tissue that  holds the uterus in place in the pelvis (parametrium) are removed. WAYS A HYSTERECTOMY CAN BE PERFORMED  Abdominal hysterectomy--A large surgical cut (incision) is made in the abdomen. The uterus is removed through this incision.  Vaginal  hysterectomy--An incision is made in the vagina. The uterus is removed through this incision. There are no abdominal incisions.  Conventional laparoscopic hysterectomy--Three or four small incisions are made in the abdomen. A thin, lighted tube with a camera (laparoscope) is inserted into one of the incisions. Other tools are put through the other incisions. The uterus is cut into small pieces. The small pieces are removed through the incisions, or they are removed through the vagina.  Laparoscopically assisted vaginal hysterectomy (LAVH)--Three or four small incisions are made in the abdomen. Part of the surgery is performed laparoscopically and part vaginally. The uterus is removed through the vagina.  Robot-assisted laparoscopic hysterectomy--A laparoscope and other tools are inserted into 3 or 4 small incisions in the abdomen. A computer-controlled device is used to give the surgeon a 3D image and to help control the surgical instruments. This allows for more precise movements of surgical instruments. The uterus is cut into small pieces and removed through the incisions or removed through the vagina. RISKS AND COMPLICATIONS  Possible complications associated with this procedure include:  Bleeding and risk of blood transfusion. Tell your health care provider if you do not want to receive any blood products.  Blood clots in the legs or lung.  Infection.  Injury to surrounding organs.  Problems or side effects related to anesthesia.  Conversion to an abdominal hysterectomy from one of the other techniques. WHAT TO EXPECT AFTER A HYSTERECTOMY  You will be given pain medicine.  You will need to have someone with you for the first 3-5 days after you go home.  You will need to follow up with your surgeon in 2-4 weeks after surgery to evaluate your progress.  You may have early menopause symptoms such as hot flashes, night sweats, and insomnia.  If you had a hysterectomy for a problem that  was not cancer or not a condition that could lead to cancer, then you no longer need Pap tests. However, even if you no longer need a Pap test, a regular exam is a good idea to make sure no other problems are starting. Document Released: 08/13/2000 Document Revised: 12/08/2012 Document Reviewed: 10/25/2012 [facility] Patient Information 2015 [place], [place]. This information is not intended to replace advice given to you by your health care provider. Make sure you discuss any questions you have with your health care provider.

## 2014-09-27 ENCOUNTER — Encounter: Payer: Self-pay | Admitting: Internal Medicine

## 2014-09-27 ENCOUNTER — Ambulatory Visit: Payer: Medicaid Other | Attending: Internal Medicine | Admitting: Internal Medicine

## 2014-09-27 VITALS — BP 111/76 | HR 85 | Temp 98.0°F | Resp 16 | Wt 279.6 lb

## 2014-09-27 DIAGNOSIS — D649 Anemia, unspecified: Secondary | ICD-10-CM | POA: Insufficient documentation

## 2014-09-27 DIAGNOSIS — K439 Ventral hernia without obstruction or gangrene: Secondary | ICD-10-CM | POA: Diagnosis not present

## 2014-09-27 DIAGNOSIS — F17210 Nicotine dependence, cigarettes, uncomplicated: Secondary | ICD-10-CM | POA: Insufficient documentation

## 2014-09-27 DIAGNOSIS — Z79899 Other long term (current) drug therapy: Secondary | ICD-10-CM | POA: Insufficient documentation

## 2014-09-27 DIAGNOSIS — Z720 Tobacco use: Secondary | ICD-10-CM | POA: Diagnosis not present

## 2014-09-27 DIAGNOSIS — F17200 Nicotine dependence, unspecified, uncomplicated: Secondary | ICD-10-CM

## 2014-09-27 DIAGNOSIS — D259 Leiomyoma of uterus, unspecified: Secondary | ICD-10-CM | POA: Insufficient documentation

## 2014-09-27 DIAGNOSIS — K219 Gastro-esophageal reflux disease without esophagitis: Secondary | ICD-10-CM | POA: Insufficient documentation

## 2014-09-27 DIAGNOSIS — N920 Excessive and frequent menstruation with regular cycle: Secondary | ICD-10-CM | POA: Insufficient documentation

## 2014-09-27 DIAGNOSIS — J45909 Unspecified asthma, uncomplicated: Secondary | ICD-10-CM | POA: Insufficient documentation

## 2014-09-27 DIAGNOSIS — D219 Benign neoplasm of connective and other soft tissue, unspecified: Secondary | ICD-10-CM | POA: Diagnosis not present

## 2014-09-27 NOTE — Progress Notes (Signed)
MRN: [id number] Name: Rebecca Johnston  Sex: female Age: 44 y.o. DOB: 02/05/1971  Allergies: Review of patient's allergies indicates no known allergies.  Chief Complaint  Patient presents with  . Follow-up    HPI: Patient is 44 y.o. female who has history of anemia secondary to menorrhagia has fibroids, currently following up with OB/GYN, patient recently had her CT abdomen done which reported went to hernia containing fat, she is requesting referral to see a surgeon , she is to smoke cigarettes, I have counseled patient to quit smoking, patient also is not taking her iron supplements 3 times a day, currently denies any headache dizziness chest and shortness of breath.  Past Medical History  Diagnosis Date  . Anemia   . Obesity, Class III, BMI 40-49.9 (morbid obesity)   . Fibroids   . GERD (gastroesophageal reflux disease)   . History of blood transfusion Feb 2015  . Asthma     Past Surgical History  Procedure Laterality Date  . Ovarian cyst removal    . Dilitation & currettage/hystroscopy with versapoint resection N/A 01/06/2014    Procedure: HYSTEROCOPY WITH VERSAPOINT;  Surgeon: Woodroe Mode, MD;  Location: [facility];  Service: Gynecology;  Laterality: N/A;      Medication List       This list is accurate as of: 09/27/14 10:40 AM.  Always use your most recent med list.               acetaminophen 325 MG tablet  Commonly known as:  TYLENOL  Take 650 mg by mouth every 6 (six) hours as needed.     albuterol 108 (90 BASE) MCG/ACT inhaler  Commonly known as:  PROVENTIL HFA;VENTOLIN HFA  Inhale 2 puffs into the lungs every 6 (six) hours as needed for wheezing or shortness of breath.     cholecalciferol 1000 UNITS tablet  Commonly known as:  VITAMIN D  Take 1,000 Units by mouth daily.     ferrous sulfate 325 (65 FE) MG tablet  Take 325 mg by mouth 2 (two) times daily with a meal.     fluticasone 50 MCG/ACT nasal spray  Commonly known as:  FLONASE  Place 2 sprays  into both nostrils daily.     medroxyPROGESTERone 10 MG tablet  Commonly known as:  PROVERA  Take 2 tablets (20 mg total) by mouth daily.     omeprazole 20 MG capsule  Commonly known as:  PRILOSEC  Take 1 capsule (20 mg total) by mouth daily.        No orders of the defined types were placed in this encounter.     There is no immunization history on file for this patient.  Family History  Problem Relation Age of Onset  . Hypertension Mother   . Diabetes Father   . Breast cancer Maternal Aunt   . Diabetes Maternal Aunt   . Diabetes Paternal Aunt   . Diabetes Maternal Aunt   . Diabetes Maternal Aunt     History  Substance Use Topics  . Smoking status: Current Every Day Smoker -- 0.25 packs/day for 25 years    Types: Cigarettes  . Smokeless tobacco: Never Used  . Alcohol Use: Yes     Comment: socially    Review of Systems   As noted in HPI  Filed Vitals:   09/27/14 0956  BP: 111/76  Pulse: 85  Temp: 98 F (36.7 C)  Resp: 16    Physical Exam  Physical Exam  Constitutional: No distress.  Cardiovascular: Normal rate and regular rhythm.   Pulmonary/Chest: Breath sounds normal. No respiratory distress. She has no wheezes. She has no rales.  Abdominal: There is no tenderness. There is no rebound and no guarding.  Supraumbilical hernia, nontender, increases with coughing     Labs   Lab Results  Component Value Date   WBC 8.4 01/02/2014   HGB 9.9* 01/02/2014   HCT 33.0* 01/02/2014   PLT 384 01/02/2014   GLUCOSE 112* 05/30/2013   CHOL 145 05/06/2013   TRIG 98 05/06/2013   HDL 35* 05/06/2013   LDLCALC 90 05/06/2013   ALT 16 05/30/2013   AST 23 05/30/2013   NA 135 05/30/2013   K 4.3 05/30/2013   CL 100 05/30/2013   CREATININE 0.93 05/30/2013   BUN 12 05/30/2013   CO2 25 05/30/2013   TSH 1.983 05/30/2013   HGBA1C 5.9* 05/06/2013    Lab Results  Component Value Date   HGBA1C 5.9* 05/06/2013     Assessment and Plan  Ventral hernia without  obstruction or gangrene - Plan: Ambulatory referral to General Surgery  Smoking Consultation to quit smoking  Uterine leiomyoma, unspecified location Following up with OB/GYN  Anemia, unspecified anemia type Have advised patient to take iron supplement 3 times a day, repeat CBC on following visit.    Return in about 3 months (around 12/28/2014), or if symptoms worsen or fail to improve.   This note has been created with Surveyor, quantity. Any transcriptional errors are unintentional.    Lorayne Marek, MD

## 2014-09-27 NOTE — Progress Notes (Signed)
Patient here for follow up Patient is requesting a referral to general surgeon For repair of her hernia

## 2014-10-11 ENCOUNTER — Encounter (HOSPITAL_COMMUNITY): Payer: Self-pay | Admitting: *Deleted

## 2014-10-11 ENCOUNTER — Other Ambulatory Visit: Payer: Self-pay | Admitting: Surgery

## 2014-10-11 NOTE — H&P (Signed)
Rebecca Johnston 10/11/2014 11:48 AM Location: [facility] Patient #: [id number] DOB: 07-27-1970 Single / Language: Cleophus Molt / Race: Black or African American Female History of Present Illness Adin Hector MD; 10/11/2014 12:21 PM) Patient words: ven hernia.  The patient is a 44 year old female who presents with an incisional hernia. Patient sent for surgical consultation by Dr. Emeterio Reeve for concern of a supraumbilical incarcerated abdominal hernia. Pleasant morbidly obese smoking female. History of unroofing of RIGHT sided peritoneal inclusion cyst and RIGHT hydrosalpinx 2007. LEFT salpingectomy in 2001 for ruptured LEFT ectopic pregnancy 2001. Patient noticed a lump at her supraumbilical incision from 5176. Gradually gotten larger. Patient struggles with anemia and menorrhagia. Has had attempts to control this including hysteroscopy and resection of submucosal fibroid. Still having bleeding and more fibroid uterus. Offer made for hysterectomy and removal one of the ovaries with the persistent cyst. She is interested in proceeding. She wondered if her hernia can repaired at the same time. Therefore surgical consultation requested by Dr. Roselie Awkward. She is trying to cut about smoking but still smokes at least a quarter to half pack a day. Her weight has been stable. Does not recall any history is infections or fall or trauma. Usually has a bowel movement every day. She walk a half hour without much difficulty. Other Problems Elbert Ewings, CMA; 10/11/2014 11:48 AM) Gastroesophageal Reflux Disease Other disease, cancer, significant illness  Past Surgical History Elbert Ewings, CMA; 10/11/2014 11:48 AM) No pertinent past surgical history  Diagnostic Studies History Elbert Ewings, CMA; 10/11/2014 11:48 AM) Colonoscopy never Mammogram within last year Pap Smear 1-5 years ago  Allergies Elbert Ewings, CMA; 10/11/2014 11:48 AM) No Known Drug Allergies  10/11/2014  Medication History Elbert Ewings, CMA; 10/11/2014 11:50 AM) Omeprazole (20MG  Tablet DR, Oral) Active. Provera (10MG  Tablet, Oral) Active. Flonase (50MCG/DOSE Inhaler, Nasal) Active. Ferrous Fumarate (325MG  Capsule, Oral) Active. Vitamin D (1000UNIT Tablet, Oral) Active. Albuterol Sulfate HFA (108 (90 Base)MCG/ACT Aerosol Soln, Inhalation) Active. Tylenol (500MG  Capsule, Oral as needed) Active. Medications Reconciled  Social History Elbert Ewings, [place]; 10/11/2014 11:48 AM) Alcohol use Occasional alcohol use. Caffeine use Coffee. No drug use Tobacco use Current every day smoker.  Family History Elbert Ewings, [place]; 10/11/2014 11:48 AM) Breast Cancer Family Members In General. Cervical Cancer Family Members In General. Diabetes Mellitus Family Members In General. Hypertension Mother.  Pregnancy / Birth History Elbert Ewings, CMA; 10/11/2014 11:48 AM) Age at menarche 39 years. Contraceptive History Oral contraceptives. Gravida 0 Para 0 Regular periods     Review of Systems Elbert Ewings CMA; 10/11/2014 11:48 AM) General Not Present- Appetite Loss, Chills, Fatigue, Fever, Night Sweats, Weight Gain and Weight Loss. Skin Not Present- Change in Wart/Mole, Dryness, Hives, Jaundice, New Lesions, Non-Healing Wounds, Rash and Ulcer. HEENT Present- Hoarseness and Wears glasses/contact lenses. Not Present- Earache, Hearing Loss, Nose Bleed, Oral Ulcers, Ringing in the Ears, Seasonal Allergies, Sinus Pain, Sore Throat, Visual Disturbances and Yellow Eyes. Respiratory Not Present- Bloody sputum, Chronic Cough, Difficulty Breathing, Snoring and Wheezing. Breast Not Present- Breast Mass, Breast Pain, Nipple Discharge and Skin Changes. Cardiovascular Not Present- Chest Pain, Difficulty Breathing Lying Down, Leg Cramps, Palpitations, Rapid Heart Rate, Shortness of Breath and Swelling of Extremities. Gastrointestinal Not Present- Abdominal Pain, Bloating, Bloody Stool,  Change in Bowel Habits, Chronic diarrhea, Constipation, Difficulty Swallowing, Excessive gas, Gets full quickly at meals, Hemorrhoids, Indigestion, Nausea, Rectal Pain and Vomiting. Female Genitourinary Not Present- Frequency, Nocturia, Painful Urination, Pelvic Pain and Urgency. Musculoskeletal Present- Swelling of Extremities.  Not Present- Back Pain, Joint Pain, Joint Stiffness, Muscle Pain and Muscle Weakness. Neurological Not Present- Decreased Memory, Fainting, Headaches, Numbness, Seizures, Tingling, Tremor, Trouble walking and Weakness. Psychiatric Not Present- Anxiety, Bipolar, Change in Sleep Pattern, Depression, Fearful and Frequent crying. Endocrine Not Present- Cold Intolerance, Excessive Hunger, Hair Changes, Heat Intolerance, Hot flashes and New Diabetes. Hematology Not Present- Easy Bruising, Excessive bleeding, Gland problems, HIV and Persistent Infections.  Vitals Elbert Ewings CMA; 10/11/2014 11:50 AM) 10/11/2014 11:50 AM Weight: 278 lb Height: 66in Body Surface Area: 2.42 m Body Mass Index: 44.87 kg/m Temp.: 98.70F(Oral)  Pulse: 107 (Irregular)  Resp.: 18 (Unlabored)  BP: 130/70 (Sitting, Left Arm, Standard)     Physical Exam Adin Hector MD; 10/11/2014 12:22 PM)  General Mental Status-Alert. General Appearance-Not in acute distress, Not Sickly. Orientation-Oriented X3. Hydration-Well hydrated. Voice-Normal.  Integumentary Global Assessment Upon inspection and palpation of skin surfaces of the - Axillae: non-tender, no inflammation or ulceration, no drainage. and Distribution of scalp and body hair is normal. General Characteristics Temperature - normal warmth is noted.  Head and Neck Head-normocephalic, atraumatic with no lesions or palpable masses. Face Global Assessment - atraumatic, no absence of expression. Neck Global Assessment - no abnormal movements, no bruit auscultated on the right, no bruit auscultated on the left, no  decreased range of motion, non-tender. Trachea-midline. Thyroid Gland Characteristics - non-tender.  Eye Eyeball - Left-Extraocular movements intact, No Nystagmus. Eyeball - Right-Extraocular movements intact, No Nystagmus. Cornea - Left-No Hazy. Cornea - Right-No Hazy. Sclera/Conjunctiva - Left-No scleral icterus, No Discharge. Sclera/Conjunctiva - Right-No scleral icterus, No Discharge. Pupil - Left-Direct reaction to light normal. Pupil - Right-Direct reaction to light normal.  ENMT Ears Pinna - Left - no drainage observed, no generalized tenderness observed. Right - no drainage observed, no generalized tenderness observed. Nose and Sinuses External Inspection of the Nose - no destructive lesion observed. Inspection of the nares - Left - quiet respiration. Right - quiet respiration. Mouth and Throat Lips - Upper Lip - no fissures observed, no pallor noted. Lower Lip - no fissures observed, no pallor noted. Nasopharynx - no discharge present. Oral Cavity/Oropharynx - Tongue - no dryness observed. Oral Mucosa - no cyanosis observed. Hypopharynx - no evidence of airway distress observed.  Chest and Lung Exam Inspection Movements - Normal and Symmetrical. Accessory muscles - No use of accessory muscles in breathing. Palpation Palpation of the chest reveals - Non-tender. Auscultation Breath sounds - Normal and Clear.  Cardiovascular Auscultation Rhythm - Regular. Murmurs & Other Heart Sounds - Auscultation of the heart reveals - No Murmurs and No Systolic Clicks.  Abdomen Inspection Inspection of the abdomen reveals - No Visible peristalsis and No Abnormal pulsations. Umbilicus - No Bleeding, No Urine drainage. Palpation/Percussion Palpation and Percussion of the abdomen reveal - Soft, Non Tender, No Rebound tenderness, No Rigidity (guarding) and No Cutaneous hyperesthesia. Note: Morbidly obese but soft. Supraumbilical midline incision. 6 x 6 cm mass soft.  Incarcerated. Not inflamed. No guarding. No diastases. No peritonitis.   Female Genitourinary Sexual Maturity Tanner 5 - Adult hair pattern. Note: Moderate panniculus. Get hygiene. No inguinal hernia. No rashes/sores. No vaginal bleeding nor discharge   Peripheral Vascular Upper Extremity Inspection - Left - No Cyanotic nailbeds, Not Ischemic. Right - No Cyanotic nailbeds, Not Ischemic.  Neurologic Neurologic evaluation reveals -normal attention span and ability to concentrate, able to name objects and repeat phrases. Appropriate fund of knowledge , normal sensation and normal coordination. Mental Status Affect - not angry, not  paranoid. Cranial Nerves-Normal Bilaterally. Gait-Normal.  Neuropsychiatric Mental status exam performed with findings of-able to articulate well with normal speech/language, rate, volume and coherence, thought content normal with ability to perform basic computations and apply abstract reasoning and no evidence of hallucinations, delusions, obsessions or homicidal/suicidal ideation. Note: Smiling. Pleasant. Affable.   Musculoskeletal Global Assessment Spine, Ribs and Pelvis - no instability, subluxation or laxity. Right Upper Extremity - no instability, subluxation or laxity.  Lymphatic Head & Neck  General Head & Neck Lymphatics: Bilateral - Description - No Localized lymphadenopathy. Axillary  General Axillary Region: Bilateral - Description - No Localized lymphadenopathy. Femoral & Inguinal  Generalized Femoral & Inguinal Lymphatics: Left - Description - No Localized lymphadenopathy. Right - Description - No Localized lymphadenopathy.    Assessment & Plan Adin Hector MD; 10/11/2014 12:22 PM)  INCISIONAL HERNIA, INCARCERATED (552.21  K43.0) Impression: Supraumbilical incarcerated incisional hernia. 6 x 6 cm at soft tissue but the fascial defect looks more like around 4 x 3 cm.  I usually approach this laparoscopically with  reduction & underlay repair with mesh. Given her smoking history and obesity, I strongly recommend that she have mesh repair.  She is hoping to coordinate so that this can be done the same time as her planned hysterectomy. Sounds like Dr. Roselie Awkward is planning for a Pfannenstiel incision. I noted is reasonable that he can do his surgery and close up and then I could do mine. Probably would do a laparoscopically away from everything if possible. We could do surgery through a periumbilical midline incision but more likely that would make the incision larger to reach down in the pelvis.  She is itching to get this done as soon as possible and may delay the hernia repair and just proceed with a hysterectomy first and do the hernia prior later time. We will try and get the ball rolling and see what options are available.  Current Plans Schedule for Surgery Written instructions provided The anatomy & physiology of the abdominal wall was discussed. The pathophysiology of hernias was discussed. Natural history risks without surgery including progeressive enlargement, pain, incarceration, & strangulation was discussed. Contributors to complications such as smoking, obesity, diabetes, prior surgery, etc were discussed.  I feel the risks of no intervention will lead to serious problems that outweigh the operative risks; therefore, I recommended surgery to reduce and repair the hernia. I explained laparoscopic techniques with possible need for an open approach. I noted the probable use of mesh to patch and/or buttress the hernia repair  Risks such as bleeding, infection, abscess, need for further treatment, heart attack, death, and other risks were discussed. I noted a good likelihood this will help address the problem. Goals of post-operative recovery were discussed as well. Possibility that this will not correct all symptoms was explained. I stressed the importance of low-impact activity, aggressive pain control,  avoiding constipation, & not pushing through pain to minimize risk of post-operative chronic pain or injury. Possibility of reherniation especially with smoking, obesity, diabetes, immunosuppression, and other health conditions was discussed. We will work to minimize complications.  An educational handout further explaining the pathology & treatment options was given as well. Questions were answered. The patient expresses understanding & wishes to proceed with surgery. Pt Education - [facility] Free Text Education/Instructions: discussed with patient and provided information. Pt Education - Pamphlet Given - Laparoscopic Hernia Repair: discussed with patient and provided information. Pt Education - [facility] Hernia Post-Op HCI (Yvett Rossel): discussed with patient and provided information. Pt  Education - [facility] Pain Control (Curtez Brallier)  Adin Hector, M.D., F.A.C.S. Gastrointestinal and Minimally Invasive Surgery [facility] [address] [place], [place] [id number] [phone] Main / Paging

## 2014-10-26 NOTE — Progress Notes (Signed)
Patient received Medicaid 09/20/2014 and has not been seen in [place] in over a year. Patient's status is now inactive with [employer] effective 10/26/2014.

## 2014-11-10 ENCOUNTER — Other Ambulatory Visit: Payer: Self-pay | Admitting: Obstetrics & Gynecology

## 2014-11-20 ENCOUNTER — Telehealth: Payer: Self-pay

## 2014-11-20 NOTE — Telephone Encounter (Signed)
Called patient back stating I am returning your phone call and asked if she had her surgery scheduled yet. Patient states her surgery is for 10/7. Told patient I am unsure why she hasn't got a cycle since she is not on medication. However I would not worry about it as it can be common for women near menopause to miss periods every now and then. Patient verbalized understanding and had no questions

## 2014-11-20 NOTE — Telephone Encounter (Signed)
Pt called and stated that she wanted to know if it normal that she has not had a cycle in two months.

## 2014-11-21 ENCOUNTER — Encounter (HOSPITAL_COMMUNITY): Admission: RE | Payer: Self-pay | Source: Ambulatory Visit

## 2014-11-21 ENCOUNTER — Inpatient Hospital Stay (HOSPITAL_COMMUNITY)
Admission: RE | Admit: 2014-11-21 | Payer: Medicaid Other | Source: Ambulatory Visit | Admitting: Obstetrics & Gynecology

## 2014-11-21 SURGERY — HYSTERECTOMY, ABDOMINAL
Anesthesia: Choice | Site: Abdomen | Laterality: Right

## 2014-12-01 ENCOUNTER — Encounter (HOSPITAL_COMMUNITY): Payer: Self-pay

## 2014-12-01 ENCOUNTER — Encounter (HOSPITAL_COMMUNITY)
Admission: RE | Admit: 2014-12-01 | Discharge: 2014-12-01 | Disposition: A | Discharge status: Home / Self Care | Payer: Medicaid Other | Source: Ambulatory Visit | Attending: Surgery | Admitting: Surgery

## 2014-12-01 DIAGNOSIS — Z01818 Encounter for other preprocedural examination: Secondary | ICD-10-CM | POA: Diagnosis present

## 2014-12-01 HISTORY — DX: Reserved for inherently not codable concepts without codable children: IMO0001

## 2014-12-01 LAB — PREGNANCY, URINE: Preg Test, Ur: NEGATIVE

## 2014-12-01 LAB — CBC
HCT: 31.8 % — ABNORMAL LOW (ref 36.0–46.0)
Hemoglobin: 9.4 g/dL — ABNORMAL LOW (ref 12.0–15.0)
MCH: 17.6 pg — ABNORMAL LOW (ref 26.0–34.0)
MCHC: 29.6 g/dL — ABNORMAL LOW (ref 30.0–36.0)
MCV: 59.6 fL — ABNORMAL LOW (ref 78.0–100.0)
PLATELETS: 390 10*3/uL (ref 150–400)
RBC: 5.34 MIL/uL — AB (ref 3.87–5.11)
RDW: 20.5 % — AB (ref 11.5–15.5)
WBC: 8.8 10*3/uL (ref 4.0–10.5)

## 2014-12-01 NOTE — Progress Notes (Signed)
Echo 05/2013 Pt hgb at today's pre-op visit is low. Routed results to Dr. Johney Maine.  Wyn Quaker

## 2014-12-01 NOTE — Patient Instructions (Addendum)
Rebecca Johnston  12/01/2014   Your procedure is scheduled on: 12/08/2014  Report to [facility] Main  Entrance take [name]  elevators to 3rd floor to  [name] at 05:30AM.  Call this number if you have problems the morning of surgery [phone]   Remember: ONLY 1 PERSON MAY GO WITH YOU TO SHORT STAY TO GET  READY MORNING OF [name].  Do not eat food or drink liquids :After Midnight.     Take these medicines the morning of surgery with A SIP OF WATER: Albuterol inhaler, Flonase, Prilosec if needed.                                You may not have any metal on your body including hair pins and              piercings  Do not wear jewelry, make-up, lotions, powders or perfumes, deodorant             Do not wear nail polish.  Do not shave  48 hours prior to surgery.           Do not bring valuables to the hospital. [place].  Contacts, dentures or bridgework may not be worn into surgery.  Leave suitcase in the car. After surgery it may be brought to your room.     Special Instructions: practice deep breathing and leg exercises           ________________________________[facility] - Preparing for Surgery Before surgery, you can play an important role.  Because skin is not sterile, your skin needs to be as free of germs as possible.  You can reduce the number of germs on your skin by washing with CHG (chlorahexidine gluconate) soap before surgery.  CHG is an antiseptic cleaner which kills germs and bonds with the skin to continue killing germs even after washing. Please DO NOT use if you have an allergy to CHG or antibacterial soaps.  If your skin becomes reddened/irritated stop using the CHG and inform your nurse when you arrive at Short Stay. Do not shave (including legs and underarms) for at least 48 hours prior to the first CHG shower.  You may shave your face/neck. Please follow these instructions  carefully:  1.  Shower with CHG Soap the night before surgery and the  morning of Surgery.  2.  If you choose to wash your hair, wash your hair first as usual with your  normal  shampoo.  3.  After you shampoo, rinse your hair and body thoroughly to remove the  shampoo.                           4.  Use CHG as you would any other liquid soap.  You can apply chg directly  to the skin and wash                       Gently with a scrungie or clean washcloth.  5.  Apply the CHG Soap to your body ONLY FROM THE NECK DOWN.   Do not use on face/ open  Wound or open sores. Avoid contact with eyes, ears mouth and genitals (private parts).                       Wash face,  Genitals (private parts) with your normal soap.             6.  Wash thoroughly, paying special attention to the area where your surgery  will be performed.  7.  Thoroughly rinse your body with warm water from the neck down.  8.  DO NOT shower/wash with your normal soap after using and rinsing off  the CHG Soap.                9.  Pat yourself dry with a clean towel.            10.  Wear clean pajamas.            11.  Place clean sheets on your bed the night of your first shower and do not  sleep with pets. Day of Surgery : Do not apply any lotions/deodorants the morning of surgery.  Please wear clean clothes to the hospital/surgery center.  FAILURE TO FOLLOW THESE INSTRUCTIONS MAY RESULT IN THE CANCELLATION OF YOUR SURGERY PATIENT SIGNATURE_________________________________  NURSE SIGNATURE__________________________________  ________________________________________________________________________ _____________________________________

## 2014-12-07 MED ORDER — CEFAZOLIN SODIUM-DEXTROSE 2-3 GM-% IV SOLR: 2.0000 g | INTRAVENOUS | Status: DC

## 2014-12-07 MED ORDER — DEXTROSE 5 % IV SOLN
3.0000 g | INTRAVENOUS | Status: AC
  Administered 2014-12-08: 3 g via INTRAVENOUS
  Filled 2014-12-07: qty 3000

## 2014-12-08 ENCOUNTER — Inpatient Hospital Stay (HOSPITAL_COMMUNITY)
Admission: RE | Admit: 2014-12-08 | Discharge: 2014-12-17 | DRG: 336 | Disposition: A | Discharge status: Home / Self Care | Payer: Medicaid Other | Source: Ambulatory Visit | Attending: Surgery | Admitting: Surgery

## 2014-12-08 ENCOUNTER — Ambulatory Visit (HOSPITAL_COMMUNITY): Payer: Medicaid Other | Admitting: Anesthesiology

## 2014-12-08 ENCOUNTER — Encounter (HOSPITAL_COMMUNITY): Payer: Self-pay | Admitting: *Deleted

## 2014-12-08 ENCOUNTER — Encounter (HOSPITAL_COMMUNITY)
Admission: RE | Disposition: A | Discharge status: Home / Self Care | Payer: Self-pay | Source: Ambulatory Visit | Attending: Surgery

## 2014-12-08 DIAGNOSIS — F17210 Nicotine dependence, cigarettes, uncomplicated: Secondary | ICD-10-CM | POA: Diagnosis present

## 2014-12-08 DIAGNOSIS — K219 Gastro-esophageal reflux disease without esophagitis: Secondary | ICD-10-CM | POA: Diagnosis present

## 2014-12-08 DIAGNOSIS — K567 Ileus, unspecified: Secondary | ICD-10-CM | POA: Diagnosis not present

## 2014-12-08 DIAGNOSIS — D250 Submucous leiomyoma of uterus: Secondary | ICD-10-CM | POA: Diagnosis present

## 2014-12-08 DIAGNOSIS — R079 Chest pain, unspecified: Secondary | ICD-10-CM

## 2014-12-08 DIAGNOSIS — D649 Anemia, unspecified: Secondary | ICD-10-CM | POA: Diagnosis present

## 2014-12-08 DIAGNOSIS — Z8249 Family history of ischemic heart disease and other diseases of the circulatory system: Secondary | ICD-10-CM

## 2014-12-08 DIAGNOSIS — K460 Unspecified abdominal hernia with obstruction, without gangrene: Secondary | ICD-10-CM | POA: Diagnosis present

## 2014-12-08 DIAGNOSIS — N920 Excessive and frequent menstruation with regular cycle: Secondary | ICD-10-CM | POA: Diagnosis present

## 2014-12-08 DIAGNOSIS — D259 Leiomyoma of uterus, unspecified: Secondary | ICD-10-CM | POA: Diagnosis not present

## 2014-12-08 DIAGNOSIS — Z8049 Family history of malignant neoplasm of other genital organs: Secondary | ICD-10-CM

## 2014-12-08 DIAGNOSIS — R1110 Vomiting, unspecified: Secondary | ICD-10-CM

## 2014-12-08 DIAGNOSIS — Z01812 Encounter for preprocedural laboratory examination: Secondary | ICD-10-CM

## 2014-12-08 DIAGNOSIS — Z833 Family history of diabetes mellitus: Secondary | ICD-10-CM

## 2014-12-08 DIAGNOSIS — D62 Acute posthemorrhagic anemia: Secondary | ICD-10-CM | POA: Diagnosis not present

## 2014-12-08 DIAGNOSIS — F17200 Nicotine dependence, unspecified, uncomplicated: Secondary | ICD-10-CM | POA: Diagnosis present

## 2014-12-08 DIAGNOSIS — K430 Incisional hernia with obstruction, without gangrene: Principal | ICD-10-CM

## 2014-12-08 DIAGNOSIS — K660 Peritoneal adhesions (postprocedural) (postinfection): Secondary | ICD-10-CM | POA: Diagnosis present

## 2014-12-08 DIAGNOSIS — Z6841 Body Mass Index (BMI) 40.0 and over, adult: Secondary | ICD-10-CM

## 2014-12-08 HISTORY — PX: VENTRAL HERNIA REPAIR: SHX424

## 2014-12-08 HISTORY — PX: ABDOMINAL HYSTERECTOMY: SHX81

## 2014-12-08 HISTORY — DX: Unspecified abdominal hernia with obstruction, without gangrene: K46.0

## 2014-12-08 HISTORY — PX: LAPAROSCOPIC LYSIS OF ADHESIONS: SHX5905

## 2014-12-08 HISTORY — PX: SUPRA-UMBILICAL HERNIA: SHX6105

## 2014-12-08 LAB — TYPE AND SCREEN
ABO/RH(D): O POS
ANTIBODY SCREEN: NEGATIVE

## 2014-12-08 SURGERY — REPAIR, HERNIA, VENTRAL, LAPAROSCOPIC
Anesthesia: General | Site: Abdomen

## 2014-12-08 MED ORDER — FLUTICASONE PROPIONATE 50 MCG/ACT NA SUSP
2.0000 | Freq: Every day | NASAL | Status: DC
  Administered 2014-12-09 – 2014-12-17 (×9): 2 via NASAL
  Filled 2014-12-08: qty 16

## 2014-12-08 MED ORDER — SODIUM CHLORIDE 0.9 % IJ SOLN
INTRAMUSCULAR | Status: AC
  Filled 2014-12-08: qty 10

## 2014-12-08 MED ORDER — METOPROLOL TARTRATE 1 MG/ML IV SOLN
5.0000 mg | Freq: Four times a day (QID) | INTRAVENOUS | Status: DC | PRN
  Filled 2014-12-08: qty 5

## 2014-12-08 MED ORDER — ROCURONIUM BROMIDE 100 MG/10ML IV SOLN
INTRAVENOUS | Status: AC
  Filled 2014-12-08: qty 1

## 2014-12-08 MED ORDER — FENTANYL CITRATE (PF) 100 MCG/2ML IJ SOLN
INTRAMUSCULAR | Status: AC
  Filled 2014-12-08: qty 2

## 2014-12-08 MED ORDER — HYDROMORPHONE HCL 1 MG/ML IJ SOLN
0.2500 mg | INTRAMUSCULAR | Status: DC | PRN
  Administered 2014-12-08 (×4): 0.25 mg via INTRAVENOUS

## 2014-12-08 MED ORDER — EPINEPHRINE HCL 1 MG/ML IJ SOLN: INTRAMUSCULAR | Status: DC | PRN

## 2014-12-08 MED ORDER — NAPROXEN 500 MG PO TABS
500.0000 mg | ORAL_TABLET | Freq: Two times a day (BID) | ORAL | Status: DC
  Administered 2014-12-08 – 2014-12-11 (×6): 500 mg via ORAL
  Filled 2014-12-08 (×9): qty 1

## 2014-12-08 MED ORDER — LIP MEDEX EX OINT
1.0000 "application " | TOPICAL_OINTMENT | Freq: Two times a day (BID) | CUTANEOUS | Status: DC
  Administered 2014-12-08 – 2014-12-17 (×16): 1 via TOPICAL
  Filled 2014-12-08 (×6): qty 7

## 2014-12-08 MED ORDER — MAGIC MOUTHWASH
15.0000 mL | Freq: Four times a day (QID) | ORAL | Status: DC | PRN
  Filled 2014-12-08: qty 15

## 2014-12-08 MED ORDER — POLYETHYLENE GLYCOL 3350 17 G PO PACK: 17.0000 g | PACK | Freq: Two times a day (BID) | ORAL | Status: DC | PRN

## 2014-12-08 MED ORDER — ONDANSETRON HCL 4 MG/2ML IJ SOLN
INTRAMUSCULAR | Status: DC | PRN
  Administered 2014-12-08: 4 mg via INTRAVENOUS

## 2014-12-08 MED ORDER — BUPIVACAINE-EPINEPHRINE 0.25% -1:200000 IJ SOLN
INTRAMUSCULAR | Status: AC
  Filled 2014-12-08: qty 2

## 2014-12-08 MED ORDER — SODIUM CHLORIDE 0.9 % IJ SOLN
INTRAMUSCULAR | Status: AC
  Filled 2014-12-08: qty 100

## 2014-12-08 MED ORDER — DEXTROSE 5 % IV SOLN
1000.0000 mg | Freq: Four times a day (QID) | INTRAVENOUS | Status: DC | PRN
  Administered 2014-12-14: 1000 mg via INTRAVENOUS
  Filled 2014-12-08 (×3): qty 10

## 2014-12-08 MED ORDER — HYDROMORPHONE HCL 1 MG/ML IJ SOLN
INTRAMUSCULAR | Status: DC | PRN
  Administered 2014-12-08 (×2): 1 mg via INTRAVENOUS

## 2014-12-08 MED ORDER — PANTOPRAZOLE SODIUM 40 MG PO TBEC
40.0000 mg | DELAYED_RELEASE_TABLET | Freq: Every day | ORAL | Status: DC
  Administered 2014-12-08 – 2014-12-11 (×4): 40 mg via ORAL
  Filled 2014-12-08 (×6): qty 1

## 2014-12-08 MED ORDER — ROCURONIUM BROMIDE 100 MG/10ML IV SOLN
INTRAVENOUS | Status: DC | PRN
  Administered 2014-12-08: 40 mg via INTRAVENOUS
  Administered 2014-12-08: 10 mg via INTRAVENOUS
  Administered 2014-12-08: 20 mg via INTRAVENOUS
  Administered 2014-12-08: 10 mg via INTRAVENOUS

## 2014-12-08 MED ORDER — SODIUM CHLORIDE 0.9 % IJ SOLN
INTRAMUSCULAR | Status: DC | PRN
  Administered 2014-12-08: 80 mL

## 2014-12-08 MED ORDER — NEOSTIGMINE METHYLSULFATE 10 MG/10ML IV SOLN
INTRAVENOUS | Status: DC | PRN
  Administered 2014-12-08: 4 mg via INTRAVENOUS

## 2014-12-08 MED ORDER — METOPROLOL TARTRATE 12.5 MG HALF TABLET
12.5000 mg | ORAL_TABLET | Freq: Two times a day (BID) | ORAL | Status: DC | PRN
  Filled 2014-12-08: qty 1

## 2014-12-08 MED ORDER — DIPHENHYDRAMINE HCL 50 MG/ML IJ SOLN
12.5000 mg | Freq: Four times a day (QID) | INTRAMUSCULAR | Status: DC | PRN
Start: 2014-12-08 — End: 2014-12-17
  Administered 2014-12-08: 12.5 mg via INTRAVENOUS
  Administered 2014-12-09 (×2): 25 mg via INTRAVENOUS
  Filled 2014-12-08 (×4): qty 1

## 2014-12-08 MED ORDER — ONDANSETRON 4 MG PO TBDP
4.0000 mg | ORAL_TABLET | Freq: Four times a day (QID) | ORAL | Status: DC | PRN
  Administered 2014-12-11 – 2014-12-12 (×2): 4 mg via ORAL
  Filled 2014-12-08 (×2): qty 1

## 2014-12-08 MED ORDER — ONDANSETRON HCL 4 MG/2ML IJ SOLN
4.0000 mg | Freq: Four times a day (QID) | INTRAMUSCULAR | Status: DC | PRN
  Administered 2014-12-08 – 2014-12-16 (×10): 4 mg via INTRAVENOUS
  Filled 2014-12-08 (×9): qty 2

## 2014-12-08 MED ORDER — NEOSTIGMINE METHYLSULFATE 10 MG/10ML IV SOLN
INTRAVENOUS | Status: AC
  Filled 2014-12-08: qty 1

## 2014-12-08 MED ORDER — ONDANSETRON HCL 4 MG PO TABS: 4.0000 mg | ORAL_TABLET | Freq: Four times a day (QID) | ORAL | Status: DC | PRN

## 2014-12-08 MED ORDER — LACTATED RINGERS IV SOLN
INTRAVENOUS | Status: DC
  Administered 2014-12-08 (×3): via INTRAVENOUS

## 2014-12-08 MED ORDER — ACETAMINOPHEN 10 MG/ML IV SOLN
1000.0000 mg | Freq: Once | INTRAVENOUS | Status: AC
  Administered 2014-12-08: 1000 mg via INTRAVENOUS

## 2014-12-08 MED ORDER — DEXTROSE 5 % IV SOLN
3.0000 g | Freq: Once | INTRAVENOUS | Status: DC
  Filled 2014-12-08: qty 3000

## 2014-12-08 MED ORDER — CEFAZOLIN SODIUM-DEXTROSE 2-3 GM-% IV SOLR
2.0000 g | Freq: Three times a day (TID) | INTRAVENOUS | Status: AC
  Administered 2014-12-08 – 2014-12-09 (×2): 2 g via INTRAVENOUS
  Filled 2014-12-08 (×2): qty 50

## 2014-12-08 MED ORDER — ONDANSETRON HCL 4 MG/2ML IJ SOLN: 4.0000 mg | Freq: Once | INTRAMUSCULAR | Status: DC | PRN

## 2014-12-08 MED ORDER — PROPOFOL 10 MG/ML IV BOLUS
INTRAVENOUS | Status: AC
  Filled 2014-12-08: qty 20

## 2014-12-08 MED ORDER — GLYCOPYRROLATE 0.2 MG/ML IJ SOLN
INTRAMUSCULAR | Status: AC
  Filled 2014-12-08: qty 3

## 2014-12-08 MED ORDER — ONDANSETRON HCL 40 MG/20ML IJ SOLN
8.0000 mg | Freq: Four times a day (QID) | INTRAMUSCULAR | Status: DC | PRN
  Filled 2014-12-08: qty 4

## 2014-12-08 MED ORDER — BUPIVACAINE-EPINEPHRINE (PF) 0.25% -1:200000 IJ SOLN
INTRAMUSCULAR | Status: AC
  Filled 2014-12-08: qty 30

## 2014-12-08 MED ORDER — ALBUMIN HUMAN 5 % IV SOLN
INTRAVENOUS | Status: AC
  Filled 2014-12-08: qty 250

## 2014-12-08 MED ORDER — LACTATED RINGERS IV BOLUS (SEPSIS): 1000.0000 mL | Freq: Three times a day (TID) | INTRAVENOUS | Status: AC | PRN

## 2014-12-08 MED ORDER — CHLORHEXIDINE GLUCONATE 4 % EX LIQD: 1.0000 "application " | Freq: Once | CUTANEOUS | Status: DC

## 2014-12-08 MED ORDER — GLYCOPYRROLATE 0.2 MG/ML IJ SOLN
INTRAMUSCULAR | Status: DC | PRN
  Administered 2014-12-08: 0.6 mg via INTRAVENOUS

## 2014-12-08 MED ORDER — MIDAZOLAM HCL 2 MG/2ML IJ SOLN
INTRAMUSCULAR | Status: AC
  Filled 2014-12-08: qty 4

## 2014-12-08 MED ORDER — EPHEDRINE SULFATE 50 MG/ML IJ SOLN
INTRAMUSCULAR | Status: AC
Start: 2014-12-08 — End: 2014-12-08
  Filled 2014-12-08: qty 1

## 2014-12-08 MED ORDER — LIDOCAINE HCL (CARDIAC) 20 MG/ML IV SOLN
INTRAVENOUS | Status: AC
  Filled 2014-12-08: qty 5

## 2014-12-08 MED ORDER — SUFENTANIL CITRATE 50 MCG/ML IV SOLN
INTRAVENOUS | Status: AC
  Filled 2014-12-08: qty 1

## 2014-12-08 MED ORDER — OXYCODONE HCL 5 MG PO TABS
5.0000 mg | ORAL_TABLET | ORAL | Status: DC | PRN
  Administered 2014-12-10: 10 mg via ORAL
  Filled 2014-12-08: qty 2

## 2014-12-08 MED ORDER — ACETAMINOPHEN 325 MG PO TABS: 325.0000 mg | ORAL_TABLET | Freq: Four times a day (QID) | ORAL | Status: DC | PRN

## 2014-12-08 MED ORDER — ALBUMIN HUMAN 5 % IV SOLN
INTRAVENOUS | Status: DC | PRN
  Administered 2014-12-08: 10:00:00 via INTRAVENOUS

## 2014-12-08 MED ORDER — SUCCINYLCHOLINE 20MG/ML (10ML) SYRINGE FOR MEDFUSION PUMP - OPTIME
INTRAMUSCULAR | Status: DC | PRN
  Administered 2014-12-08: 120 mg via INTRAVENOUS

## 2014-12-08 MED ORDER — EPHEDRINE SULFATE 50 MG/ML IJ SOLN
INTRAMUSCULAR | Status: DC | PRN
  Administered 2014-12-08: 5 mg via INTRAVENOUS
  Administered 2014-12-08: 10 mg via INTRAVENOUS
  Administered 2014-12-08: 5 mg via INTRAVENOUS

## 2014-12-08 MED ORDER — FENTANYL CITRATE (PF) 100 MCG/2ML IJ SOLN
25.0000 ug | INTRAMUSCULAR | Status: DC | PRN
  Administered 2014-12-08 (×2): 50 ug via INTRAVENOUS

## 2014-12-08 MED ORDER — PROPOFOL 10 MG/ML IV BOLUS
INTRAVENOUS | Status: DC | PRN
  Administered 2014-12-08: 150 mg via INTRAVENOUS
  Administered 2014-12-08: 50 mg via INTRAVENOUS

## 2014-12-08 MED ORDER — BISACODYL 10 MG RE SUPP: 10.0000 mg | Freq: Two times a day (BID) | RECTAL | Status: DC | PRN

## 2014-12-08 MED ORDER — SUFENTANIL CITRATE 50 MCG/ML IV SOLN
INTRAVENOUS | Status: DC | PRN
  Administered 2014-12-08 (×6): 5 ug via INTRAVENOUS
  Administered 2014-12-08: 20 ug via INTRAVENOUS

## 2014-12-08 MED ORDER — HYDROMORPHONE HCL 1 MG/ML IJ SOLN
0.5000 mg | INTRAMUSCULAR | Status: DC | PRN
  Administered 2014-12-11: 1 mg via INTRAVENOUS
  Filled 2014-12-08 (×2): qty 1
  Filled 2014-12-08: qty 2
  Filled 2014-12-08 (×4): qty 1

## 2014-12-08 MED ORDER — ALBUTEROL SULFATE (2.5 MG/3ML) 0.083% IN NEBU: 3.0000 mL | INHALATION_SOLUTION | Freq: Four times a day (QID) | RESPIRATORY_TRACT | Status: DC | PRN

## 2014-12-08 MED ORDER — PHENOL 1.4 % MT LIQD
2.0000 | OROMUCOSAL | Status: DC | PRN
Start: 2014-12-08 — End: 2014-12-17
  Filled 2014-12-08: qty 177

## 2014-12-08 MED ORDER — MENTHOL 3 MG MT LOZG
1.0000 | LOZENGE | OROMUCOSAL | Status: DC | PRN
Start: 2014-12-08 — End: 2014-12-17
  Filled 2014-12-08: qty 9

## 2014-12-08 MED ORDER — POLYETHYLENE GLYCOL 3350 17 G PO PACK
17.0000 g | PACK | Freq: Two times a day (BID) | ORAL | Status: DC
  Administered 2014-12-09 – 2014-12-11 (×5): 17 g via ORAL
  Filled 2014-12-08 (×7): qty 1

## 2014-12-08 MED ORDER — ONDANSETRON HCL 4 MG/2ML IJ SOLN
INTRAMUSCULAR | Status: AC
  Filled 2014-12-08: qty 2

## 2014-12-08 MED ORDER — LIDOCAINE HCL (CARDIAC) 20 MG/ML IV SOLN
INTRAVENOUS | Status: DC | PRN
  Administered 2014-12-08: 100 mg via INTRAVENOUS

## 2014-12-08 MED ORDER — FERROUS SULFATE 325 (65 FE) MG PO TABS
325.0000 mg | ORAL_TABLET | Freq: Two times a day (BID) | ORAL | Status: DC
  Administered 2014-12-08 – 2014-12-10 (×4): 325 mg via ORAL
  Filled 2014-12-08 (×9): qty 1

## 2014-12-08 MED ORDER — OXYCODONE HCL 5 MG PO TABS: 5.0000 mg | ORAL_TABLET | ORAL | Status: DC | PRN

## 2014-12-08 MED ORDER — HYDROMORPHONE HCL 1 MG/ML IJ SOLN
0.5000 mg | INTRAMUSCULAR | Status: DC | PRN
  Administered 2014-12-08 – 2014-12-09 (×7): 1 mg via INTRAVENOUS
  Administered 2014-12-09: 2 mg via INTRAVENOUS
  Administered 2014-12-10: 1 mg via INTRAVENOUS
  Filled 2014-12-08 (×3): qty 1

## 2014-12-08 MED ORDER — LACTATED RINGERS IV SOLN
INTRAVENOUS | Status: DC
  Administered 2014-12-08: 14:00:00 via INTRAVENOUS
  Administered 2014-12-09: 75 mL/h via INTRAVENOUS
  Administered 2014-12-09 – 2014-12-10 (×3): via INTRAVENOUS

## 2014-12-08 MED ORDER — DEXTROSE 5 % IV SOLN
3.0000 g | Freq: Once | INTRAVENOUS | Status: AC
  Administered 2014-12-08: 3 g via INTRAVENOUS
  Filled 2014-12-08: qty 3000

## 2014-12-08 MED ORDER — SODIUM CHLORIDE 0.9 % IV SOLN
10.0000 mg | INTRAVENOUS | Status: DC | PRN
  Administered 2014-12-08: 25 ug/min via INTRAVENOUS

## 2014-12-08 MED ORDER — PROMETHAZINE HCL 25 MG/ML IJ SOLN
6.2500 mg | INTRAMUSCULAR | Status: DC | PRN
  Administered 2014-12-10: 12.5 mg via INTRAVENOUS
  Filled 2014-12-08: qty 1

## 2014-12-08 MED ORDER — HYDROMORPHONE HCL 1 MG/ML IJ SOLN
INTRAMUSCULAR | Status: AC
  Filled 2014-12-08: qty 1

## 2014-12-08 MED ORDER — ENOXAPARIN SODIUM 40 MG/0.4ML ~~LOC~~ SOLN
40.0000 mg | SUBCUTANEOUS | Status: DC
  Administered 2014-12-09 – 2014-12-11 (×3): 40 mg via SUBCUTANEOUS
  Filled 2014-12-08 (×5): qty 0.4

## 2014-12-08 MED ORDER — 0.9 % SODIUM CHLORIDE (POUR BTL) OPTIME
TOPICAL | Status: DC | PRN
  Administered 2014-12-08: 1000 mL

## 2014-12-08 MED ORDER — LACTATED RINGERS IV SOLN
INTRAVENOUS | Status: DC
  Administered 2014-12-08: 75 mL/h via INTRAVENOUS
  Administered 2014-12-10 – 2014-12-13 (×4): via INTRAVENOUS
  Administered 2014-12-14: 125 mL/h via INTRAVENOUS
  Administered 2014-12-14 – 2014-12-16 (×4): via INTRAVENOUS

## 2014-12-08 MED ORDER — ALUM & MAG HYDROXIDE-SIMETH 200-200-20 MG/5ML PO SUSP: 30.0000 mL | Freq: Four times a day (QID) | ORAL | Status: DC | PRN

## 2014-12-08 MED ORDER — LORAZEPAM 2 MG/ML IJ SOLN: 0.5000 mg | Freq: Three times a day (TID) | INTRAMUSCULAR | Status: DC | PRN

## 2014-12-08 MED ORDER — BUPIVACAINE-EPINEPHRINE 0.25% -1:200000 IJ SOLN
INTRAMUSCULAR | Status: DC | PRN
  Administered 2014-12-08: 100 mL

## 2014-12-08 MED ORDER — EPINEPHRINE HCL 0.1 MG/ML IJ SOSY: PREFILLED_SYRINGE | INTRAMUSCULAR | Status: DC | PRN

## 2014-12-08 MED ORDER — BUPIVACAINE LIPOSOME 1.3 % IJ SUSP
Freq: Once | INTRAMUSCULAR | Status: AC
  Administered 2014-12-08: 20 mL
  Filled 2014-12-08 (×3): qty 20

## 2014-12-08 MED ORDER — MIDAZOLAM HCL 5 MG/5ML IJ SOLN
INTRAMUSCULAR | Status: DC | PRN
  Administered 2014-12-08: 2 mg via INTRAVENOUS

## 2014-12-08 MED ORDER — ACETAMINOPHEN 650 MG RE SUPP: 650.0000 mg | Freq: Four times a day (QID) | RECTAL | Status: DC | PRN

## 2014-12-08 MED ORDER — HYDROMORPHONE HCL 2 MG/ML IJ SOLN
INTRAMUSCULAR | Status: AC
  Filled 2014-12-08: qty 1

## 2014-12-08 MED ORDER — NAPROXEN 500 MG PO TABS: 500.0000 mg | ORAL_TABLET | Freq: Two times a day (BID) | ORAL | Status: DC

## 2014-12-08 SURGICAL SUPPLY — 53 items
APPLIER CLIP 5 13 M/L LIGAMAX5 (MISCELLANEOUS)
APR CLP MED LRG 5 ANG JAW (MISCELLANEOUS)
BINDER ABDOMINAL 12 ML 46-62 (SOFTGOODS) ×3 IMPLANT
CABLE HIGH FREQUENCY MONO STRZ (ELECTRODE) ×5 IMPLANT
CATH KIT ON-Q SILVERSOAK 7.5 (CATHETERS) IMPLANT
CATH KIT ON-Q SILVERSOAK 7.5IN (CATHETERS) IMPLANT
CHLORAPREP W/TINT 26ML (MISCELLANEOUS) ×8 IMPLANT
CLIP APPLIE 5 13 M/L LIGAMAX5 (MISCELLANEOUS) IMPLANT
CLOSURE WOUND 1/2 X4 (GAUZE/BANDAGES/DRESSINGS) ×1
COVER SURGICAL LIGHT HANDLE (MISCELLANEOUS) ×5 IMPLANT
DECANTER SPIKE VIAL GLASS SM (MISCELLANEOUS) ×5 IMPLANT
DEVICE SECURE STRAP 25 ABSORB (INSTRUMENTS) ×3 IMPLANT
DEVICE TROCAR PUNCTURE CLOSURE (ENDOMECHANICALS) ×5 IMPLANT
DRAPE LAPAROSCOPIC ABDOMINAL (DRAPES) ×5 IMPLANT
DRAPE WARM FLUID 44X44 (DRAPE) ×5 IMPLANT
DRSG OPSITE POSTOP 4X8 (GAUZE/BANDAGES/DRESSINGS) ×3 IMPLANT
DRSG TEGADERM 2-3/8X2-3/4 SM (GAUZE/BANDAGES/DRESSINGS) ×15 IMPLANT
ELECT REM PT RETURN 9FT ADLT (ELECTROSURGICAL) ×5
ELECTRODE REM PT RTRN 9FT ADLT (ELECTROSURGICAL) ×3 IMPLANT
GAUZE SPONGE 2X2 8PLY STRL LF (GAUZE/BANDAGES/DRESSINGS) IMPLANT
GLOVE BIO SURGEON STRL SZ 6.5 (GLOVE) ×4 IMPLANT
GLOVE BIO SURGEONS STRL SZ 6.5 (GLOVE) ×1
GLOVE ECLIPSE 8.0 STRL XLNG CF (GLOVE) ×5 IMPLANT
GLOVE INDICATOR 8.0 STRL GRN (GLOVE) ×5 IMPLANT
GOWN STRL REUS W/TWL XL LVL3 (GOWN DISPOSABLE) ×10 IMPLANT
KIT BASIN OR (CUSTOM PROCEDURE TRAY) ×2 IMPLANT
MESH VENTRALIGHT ST 8X10 (Mesh General) ×3 IMPLANT
NDL SPNL 22GX3.5 QUINCKE BK (NEEDLE) ×2 IMPLANT
NEEDLE SPNL 22GX3.5 QUINCKE BK (NEEDLE) ×5 IMPLANT
PEN SKIN MARKING BROAD (MISCELLANEOUS) ×5 IMPLANT
SCISSORS LAP 5X35 DISP (ENDOMECHANICALS) ×5 IMPLANT
SET IRRIG TUBING LAPAROSCOPIC (IRRIGATION / IRRIGATOR) IMPLANT
SHEARS HARMONIC ACE PLUS 36CM (ENDOMECHANICALS) IMPLANT
SLEEVE XCEL OPT CAN 5 100 (ENDOMECHANICALS) ×10 IMPLANT
SPONGE GAUZE 2X2 STER 10/PKG (GAUZE/BANDAGES/DRESSINGS)
STRIP CLOSURE SKIN 1/2X4 (GAUZE/BANDAGES/DRESSINGS) ×6 IMPLANT
SUT MNCRL 0 MO-4 VIOLET 18 CR (SUTURE) ×1 IMPLANT
SUT MNCRL AB 4-0 PS2 18 (SUTURE) ×5 IMPLANT
SUT MONOCRYL 0 MO 4 18  CR/8 (SUTURE) ×2
SUT PDS AB 1 CT1 27 (SUTURE) ×9 IMPLANT
SUT PROLENE 1 CT 1 30 (SUTURE) ×26 IMPLANT
SUT VIC AB 0 CT1 18XCR BRD 8 (SUTURE) ×4 IMPLANT
SUT VIC AB 0 CT1 36 (SUTURE) ×15 IMPLANT
SUT VIC AB 0 CT1 8-18 (SUTURE) ×20
SUT VIC AB 2-0 SH 27 (SUTURE) ×5
SUT VIC AB 2-0 SH 27X BRD (SUTURE) ×1 IMPLANT
TOWEL OR 17X26 10 PK STRL BLUE (TOWEL DISPOSABLE) ×5 IMPLANT
TRAY FOLEY W/METER SILVER 14FR (SET/KITS/TRAYS/PACK) ×3 IMPLANT
TRAY FOLEY W/METER SILVER 16FR (SET/KITS/TRAYS/PACK) IMPLANT
TRAY LAPAROSCOPIC (CUSTOM PROCEDURE TRAY) ×5 IMPLANT
TROCAR BLADELESS OPT 5 100 (ENDOMECHANICALS) ×5 IMPLANT
TROCAR XCEL NON-BLD 11X100MML (ENDOMECHANICALS) IMPLANT
TUNNELER SHEATH ON-Q 16GX12 DP (PAIN MANAGEMENT) IMPLANT

## 2014-12-08 NOTE — Discharge Summary (Signed)
Physician Discharge Summary  Patient ID: Rebecca Johnston MRN: [id number] DOB/AGE: [birth year] 44 y.o.  Admit date: 12/08/2014 Discharge date: 12/17/2014  Patient Care Team: Lorayne Marek, MD as PCP - General (Internal Medicine) Michael Boston, MD as Consulting Physician (General Surgery) Woodroe Mode, MD as Consulting Physician (Obstetrics and Gynecology)  Admission Diagnoses: Principal Problem:   Incarcerated incisional hernia Active Problems:   Fibroid, uterine   Incarcerated hernia   Discharge Diagnoses:  Principal Problem:   Incarcerated incisional hernia Active Problems:   Fibroid, uterine   Incarcerated hernia   POST-OPERATIVE DIAGNOSIS:   Incarcarated supaumbilical ventral wall abdominal wall hernia,RIGHT OVARIAN MASS,FIBROIDS,MENORRHAGIA   SURGERY:  12/08/2014  Procedure(s): LAPAROSCOPIC INCARCERATED INCISIONAL VENTRAL WALL HERNIA REPAIR HYSTERECTOMY ABDOMINAL LAPAROSCOPIC LYSIS OF ADHESIONS LAPARSCOPIC SUPRA-UMBILICAL HERNIA REPAIR   SURGEON:    Surgeon(s): Michael Boston, MD Woodroe Mode, MD  Consults: gynecology  Hospital Course:   The patient underwent the surgery above.  Postoperatively, the patient gradually mobilized and advanced to a solid diet.  Pain and other symptoms were treated aggressively.  Patient did develop significant anemia.  Most likely related to a loss or hysterectomy.  And coagulation help.  Hemoglobin stable around 7.  Restarted later in the admission.  Patient did struggle with some constipation.  Then had nausea vomiting with an ileus.  Nasogastric tube.  Eventually permits with enema and flatus.  Underwent clamping trial.  Tolerated that.  Advanced on diet.  Did struggle with some loose stools fter her ileus resolved.Marland Kitchen However began to improve near the time of discharge.  By the time of discharge, the patient was walking well the hallways, eating food, having flatus.  Pain was well-controlled on an oral medications.  Based on  meeting discharge criteria and continuing to recover, I felt it was safe for the patient to be discharged from the hospital to further recover with close followup. Postoperative recommendations were discussed in detail.  They are written as well.   Significant Diagnostic Studies:  No results found for this or any previous visit (from the past 72 hour(s)).  No results found.  Discharge Exam: Blood pressure 125/73, pulse 104, temperature 98.4 F (36.9 C), temperature source Oral, resp. rate 15, height 5\' 6"  (1.676 m), weight 127.007 kg (280 lb), last menstrual period 11/26/2014, SpO2 99 %.  General: Pt awake/alert/oriented x4 in no major acute distress Eyes: PERRL, normal EOM. Sclera nonicteric Neuro: CN II-XII intact w/o focal sensory/motor deficits. Lymph: No head/neck/groin lymphadenopathy Psych:  No delerium/psychosis/paranoia HENT: Normocephalic, Mucus membranes moist.  No thrush Neck: Supple, No tracheal deviation Chest: No pain.  Good respiratory excursion. CV:  Pulses intact.  Regular rhythm MS: Normal AROM mjr joints.  No obvious deformity Abdomen: Soft, Nondistended.  Min tender lower abdomen.  No peritonitis.  No incarcerated hernias. Ext:  SCDs BLE.  No significant edema.  No cyanosis Skin: No petechiae / purpura  Discharged Condition: good   Past Medical History  Diagnosis Date  . Obesity, Class III, BMI 40-49.9 (morbid obesity) ([place])   . Fibroids   . GERD (gastroesophageal reflux disease)   . History of blood transfusion Feb 2015  . Asthma   . Shortness of breath dyspnea   . Anemia     Past Surgical History  Procedure Laterality Date  . Ovarian cyst removal      cyst not removed. Cyst drained  . Dilitation & currettage/hystroscopy with versapoint resection N/A 01/06/2014    Procedure: HYSTEROCOPY WITH VERSAPOINT;  Surgeon: Woodroe Mode, MD;  Location: [facility];  Service: Gynecology;  Laterality: N/A;  . Dilation and curettage of uterus      Social History    Social History  . Marital Status: Single    Spouse Name: N/A  . Number of Children: N/A  . Years of Education: N/A   Occupational History  . Not on file.   Social History Main Topics  . Smoking status: Current Every Day Smoker -- 0.25 packs/day for 25 years    Types: Cigarettes  . Smokeless tobacco: Never Used  . Alcohol Use: Yes     Comment: socially  . Drug Use: No  . Sexual Activity: Not Currently    Birth Control/ Protection: None   Other Topics Concern  . Not on file   Social History Narrative    Family History  Problem Relation Age of Onset  . Hypertension Mother   . Diabetes Father   . Breast cancer Maternal Aunt   . Diabetes Maternal Aunt   . Diabetes Paternal Aunt   . Diabetes Maternal Aunt   . Diabetes Maternal Aunt     Current Facility-Administered Medications  Medication Dose Route Frequency Provider Last Rate Last Dose  . chlorhexidine (HIBICLENS) 4 % liquid 1 application  1 application Topical Once Michael Boston, MD      . Derrill Memo ON 12/09/2014] chlorhexidine (HIBICLENS) 4 % liquid 1 application  1 application Topical Once Michael Boston, MD      . fentaNYL (SUBLIMAZE) 100 MCG/2ML injection           . fentaNYL (SUBLIMAZE) injection 25-50 mcg  25-50 mcg Intravenous Q5 min PRN Lauretta Grill, MD   50 mcg at 12/08/14 1218  . HYDROmorphone (DILAUDID) 1 MG/ML injection           . HYDROmorphone (DILAUDID) injection 0.25 mg  0.25 mg Intravenous PRN Lauretta Grill, MD   0.25 mg at 12/08/14 1245  . lactated ringers infusion   Intravenous Continuous Woodroe Mode, MD      . ondansetron [facility]) injection 4 mg  4 mg Intravenous Once PRN Lauretta Grill, MD         No Known Allergies  Disposition: 01-Home or Self Care  Discharge Instructions    Call MD for:  extreme fatigue    Complete by:  As directed      Call MD for:  hives    Complete by:  As directed      Call MD for:  persistant nausea and vomiting    Complete by:  As directed      Call MD for:  redness,  tenderness, or signs of infection (pain, swelling, redness, odor or green/yellow discharge around incision site)    Complete by:  As directed      Call MD for:  severe uncontrolled pain    Complete by:  As directed      Call MD for:    Complete by:  As directed   Temperature > 101.28F     Diet - low sodium heart healthy    Complete by:  As directed      Discharge instructions    Complete by:  As directed   Please see discharge instruction sheets.  Also refer to handout given an office.  Please call our office if you have any questions or concerns [phone]     Discharge wound care:    Complete by:  As directed   If you have closed incisions, shower and bathe over these incisions  with soap and water every day.  Remove all surgical dressings on postoperative day #3.  You do not need to replace dressings over the closed incisions unless you feel more comfortable with a Band-Aid covering it.   If you have an open wound that requires packing, please see wound care instructions.  In general, remove all dressings, wash wound with soap and water and then replace with saline moistened gauze.  Do the dressing change at least every day.  Please call our office [phone] if you have further questions.     Driving Restrictions    Complete by:  As directed   No driving until off narcotics and can safely swerve away without pain during an emergency     Increase activity slowly    Complete by:  As directed   Walk an hour a day.  Use 20-30 minute walks.  When you can walk 30 minutes without difficulty, it is fine to restart low impact/moderate activities such as biking, jogging, swimming, sexual activity, etc.  Eventually you can increase to unrestricted activity when not feeling pain.  If you feel pain: STOP!Marland Kitchen   Let pain protect you from overdoing it.  Use ice/heat & over-the-counter pain medications to help minimize soreness.  If that is not enough, then use your narcotic pain prescription as needed to  remain active.  It is better to take extra pain medications and be more active than to stay bedridden to avoid all pain medications.     Lifting restrictions    Complete by:  As directed   Avoid heavy lifting initially.  Do not push through pain.  You have no specific weight limit - if it hurts to do, DON'T DO IT.   If you feel no pain, you are not injuring anything.  Pain will protect you from injury.  Coughing and sneezing are far more stressful to your incision than any lifting.  Avoid resuming heavy lifting / intense activity until off all narcotic pain medications.  When ready to exercise more, give yourself 2 weeks to gradually get back to full intense exercise/activity.     May shower / Bathe    Complete by:  As directed      May walk up steps    Complete by:  As directed      Sexual Activity Restrictions    Complete by:  As directed   Sexual activity as tolerated.  Do not push through pain.  Pain will protect you from injury.     Walk with assistance    Complete by:  As directed   Walk over an hour a day.  May use a walker/cane/companion to help with balance and stamina.            Medication List    TAKE these medications        acetaminophen 500 MG tablet  Commonly known as:  TYLENOL  Take 1,000 mg by mouth every 6 (six) hours as needed for moderate pain.     albuterol 108 (90 BASE) MCG/ACT inhaler  Commonly known as:  PROVENTIL HFA;VENTOLIN HFA  Inhale 2 puffs into the lungs every 6 (six) hours as needed for wheezing or shortness of breath.     ferrous sulfate 325 (65 FE) MG tablet  Take 325 mg by mouth 2 (two) times daily with a meal.     fluticasone 50 MCG/ACT nasal spray  Commonly known as:  FLONASE  Place 2 sprays into both nostrils daily.  naproxen 500 MG tablet  Commonly known as:  NAPROSYN  Take 1 tablet (500 mg total) by mouth 2 (two) times daily with a meal.     omeprazole 20 MG capsule  Commonly known as:  PRILOSEC  Take 1 capsule (20 mg total) by  mouth daily.     oxyCODONE 5 MG immediate release tablet  Commonly known as:  Oxy IR/ROXICODONE  Take 1-2 tablets (5-10 mg total) by mouth every 4 (four) hours as needed for moderate pain, severe pain or breakthrough pain.           Follow-up Information    Follow up with ARNOLD,JAMES, MD. Schedule an appointment as soon as possible for a visit in 1 month.   Specialty:  Obstetrics and Gynecology   Why:  To follow up after your operation, To follow up after your hospital stay   Contact information:   [place] [id number] [phone]       Follow up with Adin Hector., MD. Schedule an appointment as soon as possible for a visit in 3 weeks.   Specialty:  General Surgery   Why:  To follow up after your operation, To follow up after your hospital stay   Contact information:   [place] [address] [phone]        Signed: Morton Peters, M.D., F.A.C.S. Gastrointestinal and Minimally Invasive Surgery [facility] [address] [place], [place] [address] [phone] Main / Paging   12/08/2014, 12:49 PM

## 2014-12-08 NOTE — Anesthesia Procedure Notes (Signed)
Date/Time: 12/08/2014 7:37 AM Performed by: Chyrel Masson Pre-anesthesia Checklist: Patient identified, Emergency Drugs available, Suction available, Patient being monitored and Timeout performed Patient Re-evaluated:Patient Re-evaluated prior to inductionOxygen Delivery Method: Circle system utilized and Simple face mask Preoxygenation: Pre-oxygenation with 100% oxygen Intubation Type: IV induction Ventilation: Oral airway inserted - appropriate to patient size and Mask ventilation with difficulty Laryngoscope Size: Mac and 4 Grade View: Grade II Tube type: Oral Tube size: 7.5 mm Number of attempts: 1 Airway Equipment and Method: Stylet Placement Confirmation: ETT inserted through vocal cords under direct vision,  positive ETCO2 and breath sounds checked- equal and bilateral Secured at: 23 cm Dental Injury: Teeth and Oropharynx as per pre-operative assessment

## 2014-12-08 NOTE — Anesthesia Preprocedure Evaluation (Addendum)
Anesthesia Evaluation  Patient identified by MRN, date of birth, ID band Patient awake    Reviewed: Allergy & Precautions, NPO status , Patient's Chart, lab work & pertinent test results  History of Anesthesia Complications Negative for: history of anesthetic complications  Airway Mallampati: III  TM Distance: >3 FB Neck ROM: Full    Dental no notable dental hx. (+) Dental Advisory Given   Pulmonary asthma , Current Smoker,    Pulmonary exam normal breath sounds clear to auscultation       Cardiovascular negative cardio ROS Normal cardiovascular exam Rhythm:Regular Rate:Normal     Neuro/Psych negative neurological ROS  negative psych ROS   GI/Hepatic Neg liver ROS, GERD  Medicated and Controlled,  Endo/Other  Morbid obesity  Renal/GU negative Renal ROS  negative genitourinary   Musculoskeletal negative musculoskeletal ROS (+)   Abdominal (+) + obese,   Peds negative pediatric ROS (+)  Hematology negative hematology ROS (+)   Anesthesia Other Findings Bilateral eye reddness, the patient reports is from contact lenses she took out prior to surgery  Reproductive/Obstetrics negative OB ROS                            Anesthesia Physical Anesthesia Plan  ASA: III  Anesthesia Plan: General   Post-op Pain Management:    Induction: Intravenous  Airway Management Planned: Oral ETT  Additional Equipment:   Intra-op Plan:   Post-operative Plan: Extubation in OR  Informed Consent: I have reviewed the patients History and Physical, chart, labs and discussed the procedure including the risks, benefits and alternatives for the proposed anesthesia with the patient or authorized representative who has indicated his/her understanding and acceptance.   Dental advisory given  Plan Discussed with: CRNA  Anesthesia Plan Comments:         Anesthesia Quick Evaluation

## 2014-12-08 NOTE — H&P (Signed)
Rebecca Johnston 10/11/2014 11:48 AM Location: [facility] Patient #: [id number] DOB: 03-19-1970 Single / Language: Cleophus Molt / Race: Black or African American Female  History of Present Illness Adin Hector MD; 10/11/2014 12:21 PM) Patient words: ven hernia.  The patient is a 44 year old female who presents with an incisional hernia. Patient sent for surgical consultation by Dr. Emeterio Reeve for concern of a supraumbilical incarcerated abdominal hernia. Pleasant morbidly obese smoking female. History of unroofing of RIGHT sided peritoneal inclusion cyst and RIGHT hydrosalpinx 2007. LEFT salpingectomy in 2001 for ruptured LEFT ectopic pregnancy 2001. Patient noticed a lump at her supraumbilical incision from 5631. Gradually gotten larger. Patient struggles with anemia and menorrhagia. Has had attempts to control this including hysteroscopy and resection of submucosal fibroid. Still having bleeding and more fibroid uterus. Offer made for hysterectomy and removal one of the ovaries with the persistent cyst. She is interested in proceeding. She wondered if her hernia can repaired at the same time. Therefore surgical consultation requested by Dr. Roselie Awkward. She is trying to cut about smoking but still smokes at least a quarter to half pack a day. Her weight has been stable. Does not recall any history is infections or fall or trauma. Usually has a bowel movement every day. She walk a half hour without much difficulty.   Other Problems Elbert Ewings, CMA; 10/11/2014 11:48 AM) Gastroesophageal Reflux Disease Other disease, cancer, significant illness  Past Surgical History Elbert Ewings, CMA; 10/11/2014 11:48 AM) No pertinent past surgical history  Diagnostic Studies History Elbert Ewings, CMA; 10/11/2014 11:48 AM) Colonoscopy never Mammogram within last year Pap Smear 1-5 years ago  Allergies Elbert Ewings, CMA; 10/11/2014 11:48 AM) No Known Drug  Allergies08/12/2014  Medication History Elbert Ewings, CMA; 10/11/2014 11:50 AM) Omeprazole (20MG  Tablet DR, Oral) Active. Provera (10MG  Tablet, Oral) Active. Flonase (50MCG/DOSE Inhaler, Nasal) Active. Ferrous Fumarate (325MG  Capsule, Oral) Active. Vitamin D (1000UNIT Tablet, Oral) Active. Albuterol Sulfate HFA (108 (90 Base)MCG/ACT Aerosol Soln, Inhalation) Active. Tylenol (500MG  Capsule, Oral as needed) Active. Medications Reconciled  Social History Elbert Ewings, [place]; 10/11/2014 11:48 AM) Alcohol use Occasional alcohol use. Caffeine use Coffee. No drug use Tobacco use Current every day smoker.  Family History Elbert Ewings, [place]; 10/11/2014 11:48 AM) Breast Cancer Family Members In General. Cervical Cancer Family Members In General. Diabetes Mellitus Family Members In General. Hypertension Mother.  Pregnancy / Birth History Elbert Ewings, CMA; 10/11/2014 11:48 AM) Age at menarche 41 years. Contraceptive History Oral contraceptives. Gravida 0 Para 0 Regular periods  Review of Systems Elbert Ewings CMA; 10/11/2014 11:48 AM) General Not Present- Appetite Loss, Chills, Fatigue, Fever, Night Sweats, Weight Gain and Weight Loss. Skin Not Present- Change in Wart/Mole, Dryness, Hives, Jaundice, New Lesions, Non-Healing Wounds, Rash and Ulcer. HEENT Present- Hoarseness and Wears glasses/contact lenses. Not Present- Earache, Hearing Loss, Nose Bleed, Oral Ulcers, Ringing in the Ears, Seasonal Allergies, Sinus Pain, Sore Throat, Visual Disturbances and Yellow Eyes. Respiratory Not Present- Bloody sputum, Chronic Cough, Difficulty Breathing, Snoring and Wheezing. Breast Not Present- Breast Mass, Breast Pain, Nipple Discharge and Skin Changes. Cardiovascular Not Present- Chest Pain, Difficulty Breathing Lying Down, Leg Cramps, Palpitations, Rapid Heart Rate, Shortness of Breath and Swelling of Extremities. Gastrointestinal Not Present- Abdominal Pain, Bloating, Bloody Stool,  Change in Bowel Habits, Chronic diarrhea, Constipation, Difficulty Swallowing, Excessive gas, Gets full quickly at meals, Hemorrhoids, Indigestion, Nausea, Rectal Pain and Vomiting. Female Genitourinary Not Present- Frequency, Nocturia, Painful Urination, Pelvic Pain and Urgency. Musculoskeletal Present- Swelling of Extremities. Not  Present- Back Pain, Joint Pain, Joint Stiffness, Muscle Pain and Muscle Weakness. Neurological Not Present- Decreased Memory, Fainting, Headaches, Numbness, Seizures, Tingling, Tremor, Trouble walking and Weakness. Psychiatric Not Present- Anxiety, Bipolar, Change in Sleep Pattern, Depression, Fearful and Frequent crying. Endocrine Not Present- Cold Intolerance, Excessive Hunger, Hair Changes, Heat Intolerance, Hot flashes and New Diabetes. Hematology Not Present- Easy Bruising, Excessive bleeding, Gland problems, HIV and Persistent Infections.   Vitals Elbert Ewings CMA; 10/11/2014 11:50 AM) 10/11/2014 11:50 AM Weight: 278 lb Height: 66in Body Surface Area: 2.42 m Body Mass Index: 44.87 kg/m Temp.: 98.9F(Oral)  Pulse: 107 (Irregular)  Resp.: 18 (Unlabored)  BP: 130/70 (Sitting, Left Arm, Standard)    Physical Exam Adin Hector MD; 10/11/2014 12:22 PM) General Mental Status-Alert. General Appearance-Not in acute distress, Not Sickly. Orientation-Oriented X3. Hydration-Well hydrated. Voice-Normal.  Integumentary Global Assessment Upon inspection and palpation of skin surfaces of the - Axillae: non-tender, no inflammation or ulceration, no drainage. and Distribution of scalp and body hair is normal. General Characteristics Temperature - normal warmth is noted.  Head and Neck Head-normocephalic, atraumatic with no lesions or palpable masses. Face Global Assessment - atraumatic, no absence of expression. Neck Global Assessment - no abnormal movements, no bruit auscultated on the right, no bruit auscultated on the left, no  decreased range of motion, non-tender. Trachea-midline. Thyroid Gland Characteristics - non-tender.  Eye Eyeball - Left-Extraocular movements intact, No Nystagmus. Eyeball - Right-Extraocular movements intact, No Nystagmus. Cornea - Left-No Hazy. Cornea - Right-No Hazy. Sclera/Conjunctiva - Left-No scleral icterus, No Discharge. Sclera/Conjunctiva - Right-No scleral icterus, No Discharge. Pupil - Left-Direct reaction to light normal. Pupil - Right-Direct reaction to light normal.  ENMT Ears Pinna - Left - no drainage observed, no generalized tenderness observed. Right - no drainage observed, no generalized tenderness observed. Nose and Sinuses External Inspection of the Nose - no destructive lesion observed. Inspection of the nares - Left - quiet respiration. Right - quiet respiration. Mouth and Throat Lips - Upper Lip - no fissures observed, no pallor noted. Lower Lip - no fissures observed, no pallor noted. Nasopharynx - no discharge present. Oral Cavity/Oropharynx - Tongue - no dryness observed. Oral Mucosa - no cyanosis observed. Hypopharynx - no evidence of airway distress observed.  Chest and Lung Exam Inspection Movements - Normal and Symmetrical. Accessory muscles - No use of accessory muscles in breathing. Palpation Palpation of the chest reveals - Non-tender. Auscultation Breath sounds - Normal and Clear.  Cardiovascular Auscultation Rhythm - Regular. Murmurs & Other Heart Sounds - Auscultation of the heart reveals - No Murmurs and No Systolic Clicks.  Abdomen Inspection Inspection of the abdomen reveals - No Visible peristalsis and No Abnormal pulsations. Umbilicus - No Bleeding, No Urine drainage. Palpation/Percussion Palpation and Percussion of the abdomen reveal - Soft, Non Tender, No Rebound tenderness, No Rigidity (guarding) and No Cutaneous hyperesthesia. Note: Morbidly obese but soft. Supraumbilical midline incision. 6 x 6 cm mass soft.  Incarcerated. Not inflamed. No guarding. No diastases. No peritonitis.   Female Genitourinary Sexual Maturity Tanner 5 - Adult hair pattern. Note: Moderate panniculus. Get hygiene. No inguinal hernia. No rashes/sores. No vaginal bleeding nor discharge   Peripheral Vascular Upper Extremity Inspection - Left - No Cyanotic nailbeds, Not Ischemic. Right - No Cyanotic nailbeds, Not Ischemic.  Neurologic Neurologic evaluation reveals -normal attention span and ability to concentrate, able to name objects and repeat phrases. Appropriate fund of knowledge , normal sensation and normal coordination. Mental Status Affect - not angry, not paranoid. Cranial  Nerves-Normal Bilaterally. Gait-Normal.  Neuropsychiatric Mental status exam performed with findings of-able to articulate well with normal speech/language, rate, volume and coherence, thought content normal with ability to perform basic computations and apply abstract reasoning and no evidence of hallucinations, delusions, obsessions or homicidal/suicidal ideation. Note: Smiling. Pleasant. Affable.   Musculoskeletal Global Assessment Spine, Ribs and Pelvis - no instability, subluxation or laxity. Right Upper Extremity - no instability, subluxation or laxity.  Lymphatic Head & Neck  General Head & Neck Lymphatics: Bilateral - Description - No Localized lymphadenopathy. Axillary  General Axillary Region: Bilateral - Description - No Localized lymphadenopathy. Femoral & Inguinal  Generalized Femoral & Inguinal Lymphatics: Left - Description - No Localized lymphadenopathy. Right - Description - No Localized lymphadenopathy.    Assessment & Plan Adin Hector MD; INCISIONAL HERNIA, INCARCERATED [phone]) Impression: Supraumbilical incarcerated incisional hernia. 6 x 6 cm at soft tissue but the fascial defect looks more like around 4 x 3 cm.  I usually approach this laparoscopically with reduction & underlay repair  with mesh. Given her smoking history and obesity, I strongly recommend that she have mesh repair.  She is hoping to coordinate so that this can be done the same time as her planned hysterectomy. Sounds like Dr. Roselie Awkward is planning for a Pfannenstiel incision. I noted is reasonable that he can do his surgery and close up and then I could do mine. Probably would do a laparoscopically away from everything if possible. We could do surgery through a periumbilical midline incision but more likely that would make the incision larger to reach down in the pelvis.  She is itching to get this done as soon as possible and may delay the hernia repair and just proceed with a hysterectomy first and do the hernia prior later time. We will try and get the ball rolling and see what options are available. Current Plans  Schedule for Surgery Written instructions provided The anatomy & physiology of the abdominal wall was discussed. The pathophysiology of hernias was discussed. Natural history risks without surgery including progeressive enlargement, pain, incarceration, & strangulation was discussed. Contributors to complications such as smoking, obesity, diabetes, prior surgery, etc were discussed.  I feel the risks of no intervention will lead to serious problems that outweigh the operative risks; therefore, I recommended surgery to reduce and repair the hernia. I explained laparoscopic techniques with possible need for an open approach. I noted the probable use of mesh to patch and/or buttress the hernia repair  Risks such as bleeding, infection, abscess, need for further treatment, heart attack, death, and other risks were discussed. I noted a good likelihood this will help address the problem. Goals of post-operative recovery were discussed as well. Possibility that this will not correct all symptoms was explained. I stressed the importance of low-impact activity, aggressive pain control, avoiding constipation, & not  pushing through pain to minimize risk of post-operative chronic pain or injury. Possibility of reherniation especially with smoking, obesity, diabetes, immunosuppression, and other health conditions was discussed. We will work to minimize complications.  An educational handout further explaining the pathology & treatment options was given as well. Questions were answered. The patient expresses understanding & wishes to proceed with surgery. Pt Education - [facility] Free Text Education/Instructions: discussed with patient and provided information. Pt Education - Pamphlet Given - Laparoscopic Hernia Repair: discussed with patient and provided information. Pt Education - [facility] Hernia Post-Op HCI ([place]): discussed with patient and provided information. Pt Education - [facility] Pain Control ([place])  Adin Hector, M.D., F.A.C.S. Gastrointestinal and Minimally Invasive Surgery [facility] [address] [place], [place] [address] [phone] Main / Paging  Plan combines case

## 2014-12-08 NOTE — Anesthesia Postprocedure Evaluation (Signed)
  Anesthesia Post-op Note  Patient: Rebecca Johnston  Procedure(s) Performed: Procedure(s) (LRB): LAPAROSCOPIC INCARCERATED INCISIONAL VENTRAL WALL HERNIA REPAIR (N/A) HYSTERECTOMY ABDOMINAL (N/A) LAPAROSCOPIC LYSIS OF ADHESIONS (N/A) LAPARSCOPIC SUPRA-UMBILICAL HERNIA REPAIR  (N/A)  Patient Location: PACU  Anesthesia Type: General  Level of Consciousness: awake and alert   Airway and Oxygen Therapy: Patient Spontanous Breathing  Post-op Pain: mild  Post-op Assessment: Post-op Vital signs reviewed, Patient's Cardiovascular Status Stable, Respiratory Function Stable, Patent Airway and No signs of Nausea or vomiting  Last Vitals:  Filed Vitals:   12/08/14 1245  BP: 125/73  Pulse: 104  Temp:   Resp: 15    Post-op Vital Signs: stable   Complications: No apparent anesthesia complications

## 2014-12-08 NOTE — H&P (Signed)
Rebecca Johnston is an 44 y.o. female. G1P0010 Patient's last menstrual period was 11/26/2014. Patient with history of large fibroid uterus and previous hysteroscopic myomectomy who continues to have heavy menses, scheduled today for TAH/RSO. She has a persistent right ovarian cyst. Dr Johney Maine with perform a ventral hernia repair.The procedure and the risk of anesthesia, bleeding,transfusion, infection, postoperative pain and bowel and bladder injury were discussed and her questions were answered. Supracervical hysterectomy was discussed as well.    Pertinent Gynecological History: Menses: flow is excessive with use of   pads or tampons on heaviest days    Blood transfusions: none Sexually transmitted diseases: no past history Previous GYN Procedures: hysteroscopy, laparoscopy   Last pap: normal    Menstrual History:  Patient's last menstrual period was 11/26/2014.    Past Medical History  Diagnosis Date  . Obesity, Class III, BMI 40-49.9 (morbid obesity) ([place])   . Fibroids   . GERD (gastroesophageal reflux disease)   . History of blood transfusion Feb 2015  . Asthma   . Shortness of breath dyspnea   . Anemia     Past Surgical History  Procedure Laterality Date  . Ovarian cyst removal      cyst not removed. Cyst drained  . Dilitation & currettage/hystroscopy with versapoint resection N/A 01/06/2014    Procedure: HYSTEROCOPY WITH VERSAPOINT;  Surgeon: Woodroe Mode, MD;  Location: [facility];  Service: Gynecology;  Laterality: N/A;  . Dilation and curettage of uterus      Family History  Problem Relation Age of Onset  . Hypertension Mother   . Diabetes Father   . Breast cancer Maternal Aunt   . Diabetes Maternal Aunt   . Diabetes Paternal Aunt   . Diabetes Maternal Aunt   . Diabetes Maternal Aunt     Social History:  reports that she has been smoking Cigarettes.  She has a 6.25 pack-year smoking history. She has never used smokeless tobacco. She reports that she drinks  alcohol. She reports that she does not use illicit drugs.  Allergies: No Known Allergies  Prescriptions prior to admission  Medication Sig Dispense Refill Last Dose  . acetaminophen (TYLENOL) 500 MG tablet Take 1,000 mg by mouth every 6 (six) hours as needed for moderate pain.   12/06/2014  . ferrous sulfate 325 (65 FE) MG tablet Take 325 mg by mouth 2 (two) times daily with a meal.   Past Month at Unknown time  . fluticasone (FLONASE) 50 MCG/ACT nasal spray Place 2 sprays into both nostrils daily. 16 g 2 Past Week at Unknown time  . omeprazole (PRILOSEC) 20 MG capsule Take 1 capsule (20 mg total) by mouth daily. (Patient taking differently: Take 20 mg by mouth daily as needed. ) 30 capsule 0 Past Month at Unknown time  . albuterol (PROVENTIL HFA;VENTOLIN HFA) 108 (90 BASE) MCG/ACT inhaler Inhale 2 puffs into the lungs every 6 (six) hours as needed for wheezing or shortness of breath. 1 Inhaler 3 More than a month at Unknown time    ROS Review of Systems - Respiratory ROS: no cough, shortness of breath, or wheezing Cardiovascular ROS: no chest pain or dyspnea on exertion Genito-Urinary ROS: no dysuria, trouble voiding, or hematuria  Blood pressure 119/74, pulse 96, temperature 98.2 F (36.8 C), temperature source Oral, resp. rate 18, height 5\' 6"  (1.676 m), weight 127.007 kg (280 lb), last menstrual period 11/26/2014, SpO2 98 %. Physical Exam Chest  Nl effort Cor RRR Abdomen soft NT Pelvic deferred Ext NT  CBC    Component Value Date/Time   WBC 8.8 12/01/2014 1030   RBC 5.34* 12/01/2014 1030   RBC 4.74 04/18/2013 0545   HGB 9.4* 12/01/2014 1030   HCT 31.8* 12/01/2014 1030   PLT 390 12/01/2014 1030   MCV 59.6* 12/01/2014 1030   MCH 17.6* 12/01/2014 1030   MCHC 29.6* 12/01/2014 1030   RDW 20.5* 12/01/2014 1030   LYMPHSABS 1.5 05/30/2013 0939   MONOABS 1.0 05/30/2013 0939   EOSABS 0.2 05/30/2013 0939   BASOSABS 0.0 05/30/2013 0939     CLINICAL DATA: Menorrhagia, uterine  fibroids status post myomectomy  EXAM: TRANSABDOMINAL AND TRANSVAGINAL ULTRASOUND OF PELVIS  TECHNIQUE: Both transabdominal and transvaginal ultrasound examinations of the pelvis were performed. Transabdominal technique was performed for global imaging of the pelvis including uterus, ovaries, adnexal regions, and pelvic cul-de-sac. It was necessary to proceed with endovaginal exam following the transabdominal exam to visualize the right ovary.  COMPARISON: 11/23/2013  FINDINGS: Uterus  Measurements: 17.3 x 8.3 x 13.9 cm. Dominant 6.8 x 5.5 x 4.6 cm transmural/subserosal right fundal fibroid, previously 6.3 x 6.1 x 7.1 cm. Additional 5.8 x 5.8 x 5.4 cm intramural/subserosal lower uterine segment fibroid, previously 5.4 x 5.2 x 5.0 cm.  Endometrium  Not discretely visualized due to shadowing fibroids.  Right ovary  Measurements: 7.9 x 5.6 x 5.5 cm. 6.9 x 4.9 x 4.4 cm complex cystic lesion in the right ovary, previously 8.4 x 5.8 x 6.0 cm, possibly reflecting an endometrioma.  Left ovary  Not discretely visualized.  Other findings  No free fluid.  IMPRESSION: Uterine fibroids, measuring up to 6.8 cm, as described above.  6.9 cm complex cystic lesion in the right ovary, stable versus mildly decreased, possibly reflecting an endometrioma.  Endometrial complex is not discretely visualized.   Electronically Signed  By: Julian Hy M.D.  On: 07/04/2014 15:46        No results found.  Assessment/Plan: Symptomatic fibroid uterus, right ovarian cyst for TAH/RSO,  Abdominal hernia for repair by Dr Acquanetta Chain 12/08/2014, 7:14 AM

## 2014-12-08 NOTE — Op Note (Signed)
[facility] PROCEDURE DATE: 12/08/2014  PREOPERATIVE DIAGNOSIS:  Symptomatic fibroids, menorrhagia POSTOPERATIVE DIAGNOSIS:  Symptomatic fibroids, menorrhagia SURGEON:   Woodroe Mode, MD ASSISTANT: Michael Boston, M.D. OPERATION:  Total abdominal hysterectomy  ANESTHESIA:  General endotracheal.  INDICATIONS: The patient is a 44 y.o. G1P0010 with the aforementioned diagnoses who desires definitive surgical management. On the preoperative visit, the risks, benefits, indications, and alternatives of the procedure were reviewed with the patient.  On the day of surgery, the risks of surgery were again discussed with the patient including but not limited to: bleeding which may require transfusion or reoperation; infection which may require antibiotics; injury to bowel, bladder, ureters or other surrounding organs; need for additional procedures; thromboembolic phenomenon, incisional problems and other postoperative/anesthesia complications. Written informed consent was obtained.    OPERATIVE FINDINGS: A 14 week size uterus with normal tubes and ovaries bilaterally.  ESTIMATED BLOOD LOSS: 500 ml FLUIDS:  2000 ml of Lactated Ringers URINE OUTPUT:  100 ml of clear yellow urine. SPECIMENS:  Uterus,cervix, sent to pathology COMPLICATIONS:  None immediate.   DESCRIPTION OF PROCEDURE:  The patient received intravenous antibiotics and had sequential compression devices applied to her lower extremities while in the preoperative area.   She was taken to the operating room and placed under general anesthesia without difficulty.The abdomen and perineum were prepped and draped in a sterile manner, and she was placed in a dorsal supine position.  A Foley catheter was inserted into the bladder and attached to constant drainage. After an adequate timeout was performed, a Pfannensteil skin incision was made. This incision was taken down to the fascia using electrocautery with care given to maintain good hemostasis.  The fascia was incised in the midline and the fascial incision was then extended bilaterally using electrocautery without difficulty. The fascia was then dissected off the underlying rectus muscles using blunt and sharp dissection. The rectus muscles were split bluntly in the midline and the peritoneum entered sharply without complication. This peritoneal incision was then extended superiorly and inferiorly with care given to prevent bowel or bladder injury. Attention was then turned to the pelvis. An O'Conner-O'Sullivan retractor was placed into the incision, and the bowel was packed away with moist laparotomy sponges. The uterus at this point was noted to be mobilized and was delivered up out of the abdomen.  The round ligaments on each side were clamped, suture ligated with 0 Vicryl, and transected with electrocautery allowing entry into the broad ligament. Of note, all sutures used in this procedure are 0 Vicryl unless otherwise noted. The anterior and posterior leaves of the broad ligament were separated, and the ureters were inspected to be safely away from the area of dissection bilaterally.  Adnexae were clamped on the patient's right side, cut, and doubly suture ligated. This procedure was repeated in an identical fashion on the left site allowing for both adnexa to remain in place.        A bladder flap was then created.  The bladder was then bluntly dissected off the lower uterine segment and cervix with good hemostasis noted. The uterine arteries were then skeletonized bilaterally and then clamped, cut, and doubly suture ligated with care given to prevent ureteral injury.  The uterus was then amputated across the lower uterine segment leaving the cervix intact. .  The uterosacral ligaments were then clamped, cut, and ligated bilaterally.  Finally, the cardinal ligaments were clamped, cut, and ligated bilaterally.  Acutely curved clamps were placed across the vagina just under  the cervix, and the  specimen was amputated and sent to pathology. The vaginal cuff angles were closed with Heaney stiches with care given to incorporate the uterosacral-cardinal ligament pedicles on both sides. The middle of the vaginal cuff was closed with 0 Vicryl running with care given to incorporate the anterior pubocervical fascia and the posterior rectovaginal fascia.   The pelvis was irrigated and hemostasis was reconfirmed at all pedicles and along the pelvic sidewall.  The ureters were inspected and noted to be peristalsing bilaterally.  All laparotomy sponges and instruments were removed from the abdomen. The peritoneum was closed with a running stitch, and the fascia was also closed in a running fashion. The subcutaneous layer was reapproximated with 2-0 Vicryl. The skin was closed with a 4-0 Vicryl subcuticular stitch. Dr. Johney Maine then prepared to repair her ventral hernia via laparoscopy.   Woodroe Mode, MD Attending [place], [facility]

## 2014-12-08 NOTE — Transfer of Care (Signed)
Immediate Anesthesia Transfer of Care Note  Patient: Rebecca Johnston  Procedure(s) Performed: Procedure(s): LAPAROSCOPIC INCARCERATED INCISIONAL VENTRAL WALL HERNIA REPAIR (N/A) HYSTERECTOMY ABDOMINAL (N/A) LAPAROSCOPIC LYSIS OF ADHESIONS (N/A) LAPARSCOPIC SUPRA-UMBILICAL HERNIA REPAIR  (N/A)  Patient Location: PACU  Anesthesia Type:General  Level of Consciousness:  sedated, patient cooperative and responds to stimulation  Airway & Oxygen Therapy:Patient Spontanous Breathing and Patient connected to face mask oxgen  Post-op Assessment:  Report given to PACU RN and Post -op Vital signs reviewed and stable  Post vital signs:  Reviewed and stable  Last Vitals:  Filed Vitals:   12/08/14 0534  BP: 119/74  Pulse: 96  Temp: 36.8 C  Resp: 18    Complications: No apparent anesthesia complications

## 2014-12-08 NOTE — Op Note (Signed)
12/08/2014  11:53 AM  PATIENT:  Rebecca Johnston  44 y.o. female  Patient Care Team: Lorayne Marek, MD as PCP - General (Internal Medicine) Michael Boston, MD as Consulting Physician (General Surgery) Woodroe Mode, MD as Consulting Physician (Obstetrics and Gynecology)  PRE-OPERATIVE DIAGNOSIS:  Incarcarated supaumbilical ventral wall abdominal wall hernia,RIGHT OVARIAN MASS,FIBROIDS,MENORRHAGIA   POST-OPERATIVE DIAGNOSIS:    Incarcarated supaumbilical ventral abdominal wall hernia [id number]. RIGHT OVARIAN MASS,FIBROIDS,MENORRHAGIA   PROCEDURE:   LAPAROSCOPIC INCARCERATED INCISIONAL VENTRAL WALL HERNIA REPAIR with mesh LAPAROSCOPIC LYSIS OF ADHESIONS    SURGEON:  Surgeon(s): Michael Boston, MD Woodroe Mode, MD  ASSISTANT: RN   ANESTHESIA:   local and general  EBL:  Total I/O In: 7829 [I.V.:2000; IV Piggyback:250] Out: 900 [Urine:300; Blood:600]  Delay start of Pharmacological VTE agent (>24hrs) due to surgical blood loss or risk of bleeding:  no  DRAINS: none   SPECIMEN:  No Specimen  DISPOSITION OF SPECIMEN:  N/A  COUNTS:  YES  PLAN OF CARE: Admit for overnight observation  PATIENT DISPOSITION:  PACU - hemodynamically stable.  INDICATION: Pleasant morbidly obese patient has developed a ventral wall abdominal hernia.  Occurred at prior laparoscopic incision.  Incarcerated.  Having discomfort.  She is having a planned hysterectomy for vaginal bleeding with possible nephrectomy.  She has not according this with her gynecologist, Dr. Emeterio Reeve.  We were to coordinate a convenient time.  Recommendation was made for surgical repair:  The anatomy & physiology of the abdominal wall was discussed. The pathophysiology of hernias was discussed. Natural history risks without surgery including progeressive enlargement, pain, incarceration & strangulation was discussed. Contributors to complications such as smoking, obesity, diabetes, prior surgery, etc were  discussed.  I feel the risks of no intervention will lead to serious problems that outweigh the operative risks; therefore, I recommended surgery to reduce and repair the hernia. I explained laparoscopic techniques with possible need for an open approach. I noted the probable use of mesh to patch and/or buttress the hernia repair  Risks such as bleeding, infection, abscess, need for further treatment, heart attack, death, and other risks were discussed. I noted a good likelihood this will help address the problem. Goals of post-operative recovery were discussed as well. Possibility that this will not correct all symptoms was explained. I stressed the importance of low-impact activity, aggressive pain control, avoiding constipation, & not pushing through pain to minimize risk of post-operative chronic pain or injury. Possibility of reherniation especially with smoking, obesity, diabetes, immunosuppression, and other health conditions was discussed. We will work to minimize complications.  An educational handout further explaining the pathology & treatment options was given as well. Questions were answered. The patient expresses understanding & wishes to proceed with surgery.   OR FINDINGS: 6 x 5.5 cm supraumbilical incisional hernia partially incarcerated with omentum.  No bowel.  No obstruction.   Type of repair - Laparoscopic underlay repair & primary repair   Name of mesh - Bard Ventralight dual sided (polypropylene / Seprafilm)  Size of mesh - Length 20 cm, Width 25 cm  Mesh overlap - 5-7 cm  Placement of mesh - Intraperitoneal underlay repair   DESCRIPTION:   The patient and oriented on an open hysterectomy by Dr. Emeterio Reeve.  I assisted him.  Once that was completed and the skin closed we transitioned over to hernia repair.  Because the hernia was supraumbilical and rather far away from the Pfannenstiel incision, I did not feel like I can do it  in all from the Pfannenstiel incision in an  open fashion.  Therefore I continued with laparoscopic approach.  Informed consent was confirmed. The patient underwent general anaesthesia without difficulty. The patient was positioned appropriately. VTE prevention in place. The patient's abdomen was already clipped, prepped, & draped in a sterile fashion. Surgical timeout confirmed our plan.   The patient was positioned in reverse Trendelenburg. Abdominal entry was gained using optical entry technique in the left upper abdomen. Entry was clean. I induced carbon dioxide insufflation. Camera inspection revealed no injury. Extra ports were carefully placed under direct laparoscopic visualization.   I could see the hernia in the supraumbilical abdomen.  I was able to reduce the omentum out of the hernia.  I laparoscopically excised the falciform ligament and scarred hernia sac off the anterior abdominal wall to have a flat anterior abdominal wall parietal peritoneum.  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  Some adhesions to the gallbladder and omentum up there as well.   This exposed the entire anterior abdominal wall.  I primarily used and focused cold scissors.    I made sure hemostasis was good.  I mapped out the region using a needle passer.   To ensure that I would have at least 5 cm radial coverage outside of the hernia defect, I chose a 25x20 cm dual sided mesh.  I placed #1 Prolene stitches around its edge about every 5 cm = 13 total.  I rolled the mesh & placed into the peritoneal cavity through the hernia itself.  10 cm fascial defect.  I unrolled  the mesh and positioned it transversely.  I secured the mesh to cover up the hernia defect using a laparoscopic suture passer to pass the tails of the Prolene through the abdominal wall & tagged them with clamps.  I started out in four corners to make sure I had the mesh centered under the hernia  defect appropriately, and then proceeded to work in quadrants.  We evacuated CO2 & desufflated the abdomen.  I tied the fascial stitches down.  I excised the old scar from the prior laparoscopic incision where the hernia occurred.  I primarily repaired the defect transversely using #1 interrupted PDS suture to good result.  I reinsufflated the abdomen.  The mesh provided at least 5-10 cm circumferential coverage around the entire region of hernia defects.   I tacked the edges & central part of the mesh to the peritoneum/posterior rectus fascia with SecureStrap absorbable tacks.   Hemostasis was excellent.  I did reinspection. Hemostasis was good. Mesh laid well. Capnoperitoneum was evacuated. 12mm lateral ports were removed. The skin was closed with Monocryl at the port sites and Steri-Strips on the fascial stitch puncture sites.  Patient is being extubated to go to the recovery room. I'm about discussed operative findings with the patient's family.   Adin Hector, M.D., F.A.C.S. Gastrointestinal and Minimally Invasive Surgery [facility] [address] [place], [place] [address] [phone] Main / Paging

## 2014-12-09 DIAGNOSIS — K219 Gastro-esophageal reflux disease without esophagitis: Secondary | ICD-10-CM | POA: Diagnosis present

## 2014-12-09 DIAGNOSIS — Z6841 Body Mass Index (BMI) 40.0 and over, adult: Secondary | ICD-10-CM | POA: Diagnosis not present

## 2014-12-09 DIAGNOSIS — K432 Incisional hernia without obstruction or gangrene: Secondary | ICD-10-CM | POA: Diagnosis present

## 2014-12-09 DIAGNOSIS — Z8249 Family history of ischemic heart disease and other diseases of the circulatory system: Secondary | ICD-10-CM | POA: Diagnosis not present

## 2014-12-09 DIAGNOSIS — F17210 Nicotine dependence, cigarettes, uncomplicated: Secondary | ICD-10-CM | POA: Diagnosis present

## 2014-12-09 DIAGNOSIS — Z01812 Encounter for preprocedural laboratory examination: Secondary | ICD-10-CM | POA: Diagnosis not present

## 2014-12-09 DIAGNOSIS — K567 Ileus, unspecified: Secondary | ICD-10-CM | POA: Diagnosis not present

## 2014-12-09 DIAGNOSIS — Z833 Family history of diabetes mellitus: Secondary | ICD-10-CM | POA: Diagnosis not present

## 2014-12-09 DIAGNOSIS — K430 Incisional hernia with obstruction, without gangrene: Secondary | ICD-10-CM | POA: Diagnosis present

## 2014-12-09 DIAGNOSIS — K660 Peritoneal adhesions (postprocedural) (postinfection): Secondary | ICD-10-CM | POA: Diagnosis present

## 2014-12-09 DIAGNOSIS — D62 Acute posthemorrhagic anemia: Secondary | ICD-10-CM | POA: Diagnosis not present

## 2014-12-09 DIAGNOSIS — N920 Excessive and frequent menstruation with regular cycle: Secondary | ICD-10-CM | POA: Diagnosis present

## 2014-12-09 DIAGNOSIS — Z8049 Family history of malignant neoplasm of other genital organs: Secondary | ICD-10-CM | POA: Diagnosis not present

## 2014-12-09 DIAGNOSIS — D250 Submucous leiomyoma of uterus: Secondary | ICD-10-CM | POA: Diagnosis present

## 2014-12-09 LAB — BASIC METABOLIC PANEL
Anion gap: 8 (ref 5–15)
BUN: 12 mg/dL (ref 6–20)
CALCIUM: 8.7 mg/dL — AB (ref 8.9–10.3)
CHLORIDE: 104 mmol/L (ref 101–111)
CO2: 25 mmol/L (ref 22–32)
CREATININE: 1.11 mg/dL — AB (ref 0.44–1.00)
GFR calc non Af Amer: 59 mL/min — ABNORMAL LOW (ref >60)
Glucose, Bld: 121 mg/dL — ABNORMAL HIGH (ref 65–99)
Potassium: 3.9 mmol/L (ref 3.5–5.1)
SODIUM: 137 mmol/L (ref 135–145)

## 2014-12-09 LAB — CBC
HEMATOCRIT: 24.9 % — AB (ref 36.0–46.0)
Hemoglobin: 7.4 g/dL — ABNORMAL LOW (ref 12.0–15.0)
MCH: 17.8 pg — AB (ref 26.0–34.0)
MCHC: 29.7 g/dL — AB (ref 30.0–36.0)
MCV: 59.9 fL — ABNORMAL LOW (ref 78.0–100.0)
Platelets: 384 10*3/uL (ref 150–400)
RBC: 4.16 MIL/uL (ref 3.87–5.11)
RDW: 20 % — AB (ref 11.5–15.5)
WBC: 12.8 10*3/uL — ABNORMAL HIGH (ref 4.0–10.5)

## 2014-12-09 NOTE — Progress Notes (Signed)
Patient ID: Rebecca Johnston, female   DOB: [birth year], 44 y.o.   MRN: [id number] 1 Day Post-Op  Subjective: Doing "pretty good". On questioning says she has some significant abdominal pain made somewhat better by medications. Has been getting up and down a little bit with some difficulty. Was nauseated yesterday, not this morning and just starting liquids.  Objective: Vital signs in last 24 hours: Temp:  [97.5 F (36.4 C)-100.6 F (38.1 C)] 98 F (36.7 C) (10/08 0538) Pulse Rate:  [87-113] 105 (10/08 0538) Resp:  [13-22] 18 (10/08 0538) BP: (92-137)/(50-75) 121/59 mmHg (10/08 0538) SpO2:  [96 %-100 %] 99 % (10/08 0538) Last BM Date: 12/07/14  Intake/Output from previous day: 10/07 0701 - 10/08 0700 In: 5028.8 [P.O.:960; I.V.:3718.8; IV Piggyback:350] Out: 6712 [Urine:3060; Blood:600] Intake/Output this shift:    General appearance: alert, cooperative and no distress Resp: no wheezing or increased work of breathing GI: appropriate mild diffuse tenderness Incision/Wound: dressings clean and dry  Lab Results:   Recent Labs  12/09/14 0510  WBC 12.8*  HGB 7.4*  HCT 24.9*  PLT 384   BMET  Recent Labs  12/09/14 0510  NA 137  K 3.9  CL 104  CO2 25  GLUCOSE 121*  BUN 12  CREATININE 1.11*  CALCIUM 8.7*     Studies/Results: No results found.  Anti-infectives: Anti-infectives    Start     Dose/Rate Route Frequency Ordered Stop   12/08/14 2000  ceFAZolin (ANCEF) IVPB 2 g/50 mL premix     2 g 100 mL/hr over 30 Minutes Intravenous 3 times per day 12/08/14 1332 12/09/14 0613   12/08/14 1030  ceFAZolin (ANCEF) 3 g in dextrose 5 % 50 mL IVPB     3 g 160 mL/hr over 30 Minutes Intravenous  Once 12/08/14 1025 12/08/14 1200   12/08/14 0600  ceFAZolin (ANCEF) 3 g in dextrose 5 % 50 mL IVPB  Status:  Discontinued     3 g 160 mL/hr over 30 Minutes Intravenous  Once 12/08/14 0552 12/08/14 0711   12/08/14 0600  ceFAZolin (ANCEF) 3 g in dextrose 5 % 50 mL IVPB     3 g 160  mL/hr over 30 Minutes Intravenous 60 min pre-op 12/07/14 1322 12/08/14 0730   12/08/14 0600  ceFAZolin (ANCEF) IVPB 2 g/50 mL premix  Status:  Discontinued     2 g 100 mL/hr over 30 Minutes Intravenous On call to O.R. 12/07/14 1321 12/07/14 1323      Assessment/Plan: s/p Procedure(s): LAPAROSCOPIC INCARCERATED INCISIONAL VENTRAL WALL HERNIA REPAIR HYSTERECTOMY ABDOMINAL LAPAROSCOPIC LYSIS OF ADHESIONS LAPARSCOPIC SUPRA-UMBILICAL HERNIA REPAIR  Stable postoperatively with expected pain. Foley out this morning. Ambulation encouraged. Continue full liquid diet.      Rebecca Johnston T 12/09/2014

## 2014-12-09 NOTE — Progress Notes (Signed)
1 Day Post-Op Procedure(s) (LRB): LAPAROSCOPIC INCARCERATED INCISIONAL VENTRAL WALL HERNIA REPAIR (N/A) HYSTERECTOMY ABDOMINAL (N/A) LAPAROSCOPIC LYSIS OF ADHESIONS (N/A) LAPARSCOPIC SUPRA-UMBILICAL HERNIA REPAIR  (N/A)  Subjective: Patient reports incisional pain and tolerating PO.    Objective: I have reviewed patient's vital signs, intake and output, medications and labs. Blood pressure 121/59, pulse 105, temperature 98 F (36.7 C), temperature source Oral, resp. rate 18, height 5\' 6"  (1.676 m), weight 127.007 kg (280 lb), last menstrual period 11/26/2014, SpO2 99 %.  General: alert, cooperative and no distress Resp: nl effort GI: soft, non-tender; bowel sounds normal; no masses,  no organomegaly and incision: clean, dry and intact Extremities: extremities normal, atraumatic, no cyanosis or edema and Homans sign is negative, no sign of DVT CBC    Component Value Date/Time   WBC 12.8* 12/09/2014 0510   RBC 4.16 12/09/2014 0510   RBC 4.74 04/18/2013 0545   HGB 7.4* 12/09/2014 0510   HCT 24.9* 12/09/2014 0510   PLT 384 12/09/2014 0510   MCV 59.9* 12/09/2014 0510   MCH 17.8* 12/09/2014 0510   MCHC 29.7* 12/09/2014 0510   RDW 20.0* 12/09/2014 0510   LYMPHSABS 1.5 05/30/2013 0939   MONOABS 1.0 05/30/2013 0939   EOSABS 0.2 05/30/2013 0939   BASOSABS 0.0 05/30/2013 0939    Intake/Output Summary (Last 24 hours) at 12/09/14 0741 Last data filed at 12/09/14 0600  Gross per 24 hour  Intake 5028.75 ml  Output   3585 ml  Net 1443.75 ml     Assessment: s/p Procedure(s): LAPAROSCOPIC INCARCERATED INCISIONAL VENTRAL WALL HERNIA REPAIR (N/A) HYSTERECTOMY ABDOMINAL (N/A) LAPAROSCOPIC LYSIS OF ADHESIONS (N/A) LAPARSCOPIC SUPRA-UMBILICAL HERNIA REPAIR  (N/A): stable and progressing well  Plan: Encourage ambulation     Rebecca Johnston 12/09/2014, 7:39 AM

## 2014-12-10 MED ORDER — TRAMADOL HCL 50 MG PO TABS
50.0000 mg | ORAL_TABLET | Freq: Four times a day (QID) | ORAL | Status: DC | PRN
  Administered 2014-12-10 – 2014-12-11 (×2): 50 mg via ORAL
  Filled 2014-12-10 (×2): qty 1

## 2014-12-10 NOTE — Progress Notes (Signed)
Patient ID: Rebecca Johnston, female   DOB: 01-26-1971, 44 y.o.   MRN: [id number] 2 Days Post-Op  Subjective: Nausea and vomiting this morning. She thinks it may have been the OxyContin. Increased pain around her hernia repair after vomiting.  Objective: Vital signs in last 24 hours: Temp:  [97.5 F (36.4 C)-99.4 F (37.4 C)] 97.5 F (36.4 C) (10/08 2138) Pulse Rate:  [92-109] 108 (10/08 2139) Resp:  [18-20] 20 (10/08 2138) BP: (116-123)/(66-78) 122/77 mmHg (10/08 2139) SpO2:  [89 %-92 %] 89 % (10/08 2138) Last BM Date: 12/07/14  Intake/Output from previous day: 10/08 0701 - 10/09 0700 In: 920 [P.O.:320; I.V.:600] Out: 2300 [Urine:2300] Intake/Output this shift:    General appearance: alert, cooperative and no distress Resp: no wheezing or increased work of breathing GI: mild appropriate tenderness around the incisions Incision/Wound: clean and dry without bleeding or infection  Lab Results:   Recent Labs  12/09/14 0510  WBC 12.8*  HGB 7.4*  HCT 24.9*  PLT 384   BMET  Recent Labs  12/09/14 0510  NA 137  K 3.9  CL 104  CO2 25  GLUCOSE 121*  BUN 12  CREATININE 1.11*  CALCIUM 8.7*     Studies/Results: No results found.  Anti-infectives: Anti-infectives    Start     Dose/Rate Route Frequency Ordered Stop   12/08/14 2000  ceFAZolin (ANCEF) IVPB 2 g/50 mL premix     2 g 100 mL/hr over 30 Minutes Intravenous 3 times per day 12/08/14 1332 12/09/14 0613   12/08/14 1030  ceFAZolin (ANCEF) 3 g in dextrose 5 % 50 mL IVPB     3 g 160 mL/hr over 30 Minutes Intravenous  Once 12/08/14 1025 12/08/14 1200   12/08/14 0600  ceFAZolin (ANCEF) 3 g in dextrose 5 % 50 mL IVPB  Status:  Discontinued     3 g 160 mL/hr over 30 Minutes Intravenous  Once 12/08/14 0552 12/08/14 0711   12/08/14 0600  ceFAZolin (ANCEF) 3 g in dextrose 5 % 50 mL IVPB     3 g 160 mL/hr over 30 Minutes Intravenous 60 min pre-op 12/07/14 1322 12/08/14 0730   12/08/14 0600  ceFAZolin (ANCEF) IVPB 2  g/50 mL premix  Status:  Discontinued     2 g 100 mL/hr over 30 Minutes Intravenous On call to O.R. 12/07/14 1321 12/07/14 1323      Assessment/Plan: s/p Procedure(s): LAPAROSCOPIC INCARCERATED INCISIONAL VENTRAL WALL HERNIA REPAIR HYSTERECTOMY ABDOMINAL LAPAROSCOPIC LYSIS OF ADHESIONS LAPARSCOPIC SUPRA-UMBILICAL HERNIA REPAIR  Postoperative nausea and vomiting, possible ileus or related to medications. Discontinue OxyContin and continue IV fluids, sips of clear liquids as tolerated. Ambulation encouraged. Borderline O2 sats. Lungs are clear. Incentive spirometer encouraged. Check CBC in a.m.   LOS: 1 day    Ronon Ferger T 12/10/2014

## 2014-12-10 NOTE — Progress Notes (Signed)
2 Days Post-Op Procedure(s) (LRB): LAPAROSCOPIC INCARCERATED INCISIONAL VENTRAL WALL HERNIA REPAIR (N/A) HYSTERECTOMY ABDOMINAL (N/A) LAPAROSCOPIC LYSIS OF ADHESIONS (N/A) LAPARSCOPIC SUPRA-UMBILICAL HERNIA REPAIR  (N/A)  Subjective: Patient reports nausea, incisional pain and tolerating PO.  She says oxycodone caused nausea.  Objective: I have reviewed patient's vital signs, intake and output and medications. Blood pressure 122/77, pulse 108, temperature 97.5 F (36.4 C), temperature source Oral, resp. rate 20, height 5\' 6"  (1.676 m), weight 127.007 kg (280 lb), last menstrual period 11/26/2014, SpO2 89 %.  General: alert, cooperative and no distress GI: soft, non-tender; bowel sounds normal; no masses,  no organomegaly and incision: clean, dry and intact Extremities: extremities normal, atraumatic, no cyanosis or edema Vaginal Bleeding: none  Intake/Output Summary (Last 24 hours) at 12/10/14 0759 Last data filed at 12/09/14 2214  Gross per 24 hour  Intake    920 ml  Output   2300 ml  Net  -1380 ml    Assessment: s/p Procedure(s): LAPAROSCOPIC INCARCERATED INCISIONAL VENTRAL WALL HERNIA REPAIR (N/A) HYSTERECTOMY ABDOMINAL (N/A) LAPAROSCOPIC LYSIS OF ADHESIONS (N/A) LAPARSCOPIC SUPRA-UMBILICAL HERNIA REPAIR  (N/A): not tolerating diet and nausea  Plan: Encourage ambulation observe for continued problem with nausea, different pain medication  LOS: 1 day    Nikko Quast 12/10/2014, 7:56 AM

## 2014-12-11 LAB — CBC
HCT: 22.6 % — ABNORMAL LOW (ref 36.0–46.0)
Hemoglobin: 6.8 g/dL — CL (ref 12.0–15.0)
MCH: 18 pg — ABNORMAL LOW (ref 26.0–34.0)
MCHC: 30.1 g/dL (ref 30.0–36.0)
MCV: 59.8 fL — ABNORMAL LOW (ref 78.0–100.0)
PLATELETS: 377 10*3/uL (ref 150–400)
RBC: 3.78 MIL/uL — ABNORMAL LOW (ref 3.87–5.11)
RDW: 19.9 % — AB (ref 11.5–15.5)
WBC: 9.3 10*3/uL (ref 4.0–10.5)

## 2014-12-11 LAB — BASIC METABOLIC PANEL
Anion gap: 7 (ref 5–15)
BUN: 10 mg/dL (ref 6–20)
CALCIUM: 8.7 mg/dL — AB (ref 8.9–10.3)
CO2: 27 mmol/L (ref 22–32)
CREATININE: 1.1 mg/dL — AB (ref 0.44–1.00)
Chloride: 103 mmol/L (ref 101–111)
GFR calc Af Amer: >60 mL/min (ref >60)
GFR, EST NON AFRICAN AMERICAN: 60 mL/min — AB (ref >60)
Glucose, Bld: 102 mg/dL — ABNORMAL HIGH (ref 65–99)
POTASSIUM: 3.6 mmol/L (ref 3.5–5.1)
SODIUM: 137 mmol/L (ref 135–145)

## 2014-12-11 MED ORDER — LACTATED RINGERS IV BOLUS (SEPSIS)
1000.0000 mL | Freq: Once | INTRAVENOUS | Status: AC
  Administered 2014-12-11: 1000 mL via INTRAVENOUS

## 2014-12-11 MED ORDER — TRAMADOL HCL 50 MG PO TABS
50.0000 mg | ORAL_TABLET | Freq: Four times a day (QID) | ORAL | Status: DC | PRN
  Administered 2014-12-11: 50 mg via ORAL
  Filled 2014-12-11: qty 1

## 2014-12-11 MED ORDER — MAGNESIUM HYDROXIDE 400 MG/5ML PO SUSP: 30.0000 mL | Freq: Two times a day (BID) | ORAL | Status: DC | PRN

## 2014-12-11 MED ORDER — LACTATED RINGERS IV BOLUS (SEPSIS): 1000.0000 mL | Freq: Three times a day (TID) | INTRAVENOUS | Status: AC | PRN

## 2014-12-11 MED ORDER — MORPHINE SULFATE (PF) 2 MG/ML IV SOLN
2.0000 mg | INTRAVENOUS | Status: DC | PRN
  Filled 2014-12-11: qty 1

## 2014-12-11 MED ORDER — ACETAMINOPHEN 500 MG PO TABS
1000.0000 mg | ORAL_TABLET | Freq: Three times a day (TID) | ORAL | Status: DC
  Administered 2014-12-11 (×2): 1000 mg via ORAL
  Filled 2014-12-11 (×5): qty 2

## 2014-12-11 MED ORDER — BISACODYL 10 MG RE SUPP
10.0000 mg | Freq: Every day | RECTAL | Status: DC
  Administered 2014-12-11 – 2014-12-15 (×4): 10 mg via RECTAL
  Filled 2014-12-11 (×5): qty 1

## 2014-12-11 MED ORDER — METOCLOPRAMIDE HCL 5 MG/ML IJ SOLN
5.0000 mg | Freq: Four times a day (QID) | INTRAMUSCULAR | Status: DC | PRN
  Administered 2014-12-12: 10 mg via INTRAVENOUS
  Filled 2014-12-11: qty 2

## 2014-12-11 NOTE — Progress Notes (Signed)
Pt c/o abd pain at level 8, and nausea.  She was couple of hrs away from being able to have Tramadol or Zofran. I administered 1 mg Dilaudid and 6.5 mg phenergan. Pt stated she is still not having gas. She had previously been ambulating in hall. Abd soft, hypoactive BS. I decreased her diet down to clears.  Will continue to monitor.

## 2014-12-11 NOTE — Progress Notes (Signed)
Patient is alert and oriented, lab called to report a critical lab value of hemoglobin of 6.8, MD Gross notified and ordered to hold Lovenox currently Neta Mends RN 8:53 AM  12-11-2014

## 2014-12-11 NOTE — Progress Notes (Signed)
3 Days Post-Op Procedure(s) (LRB): LAPAROSCOPIC INCARCERATED INCISIONAL VENTRAL WALL HERNIA REPAIR (N/A) HYSTERECTOMY ABDOMINAL (N/A) LAPAROSCOPIC LYSIS OF ADHESIONS (N/A) LAPARSCOPIC SUPRA-UMBILICAL HERNIA REPAIR  (N/A)  Subjective: Patient reports incisional pain, tolerating PO and + flatus.    Objective: I have reviewed patient's vital signs, intake and output, medications and labs. Blood pressure 115/67, pulse 103, temperature 98.3 F (36.8 C), temperature source Oral, resp. rate 18, height 5\' 6"  (1.676 m), weight 127.007 kg (280 lb), last menstrual period 11/26/2014, SpO2 92 %.  General: alert, cooperative and no distress GI: incision: clean, dry and intact Extremities: extremities normal, atraumatic, no cyanosis or edema  Assessment: s/p Procedure(s): LAPAROSCOPIC INCARCERATED INCISIONAL VENTRAL WALL HERNIA REPAIR (N/A) HYSTERECTOMY ABDOMINAL (N/A) LAPAROSCOPIC LYSIS OF ADHESIONS (N/A) LAPARSCOPIC SUPRA-UMBILICAL HERNIA REPAIR  (N/A): progressing well and ileus may be resolving  Plan: Encourage ambulation  LOS: 2 days    Rebecca Johnston 12/11/2014, 8:00 AM

## 2014-12-11 NOTE — Progress Notes (Signed)
[facility]  [address] [place], [place] [address] Phone: [phone] FAX: [place] [phone] 1970/03/08   Problem List:   Principal Problem:   Incarcerated incisional hernia Active Problems:   Fibroid, uterine   Incarcerated hernia   3 Days Post-Op  12/08/2014  Procedure(s): LAPAROSCOPIC INCARCERATED INCISIONAL VENTRAL WALL HERNIA REPAIR HYSTERECTOMY ABDOMINAL LAPAROSCOPIC LYSIS OF ADHESIONS LAPARSCOPIC SUPRA-UMBILICAL HERNIA REPAIR   Assessment  Fair  Plan:  -check labs - update Hgb low, hold anticoagulation -pain control Bowel regimen -VTE prophylaxis- SCDs, etc -mobilize as tolerated to help recovery  Adin Hector, M.D., F.A.C.S. Gastrointestinal and Minimally Invasive Surgery [facility] [address] [place], [place] [address] [phone] Main / Paging   12/11/2014  Subjective:  Sore Loopy w narcotics  Objective:  Vital signs:  Filed Vitals:   12/10/14 1045 12/10/14 1721 12/10/14 2200 12/11/14 0530  BP:  135/75 124/70 115/67  Pulse: 103 105 108 103  Temp:  98.1 F (36.7 C) 98 F (36.7 C) 98.3 F (36.8 C)  TempSrc:  Oral Oral Oral  Resp:  18 16 18   Height:      Weight:      SpO2: 96% 96% 94% 92%    Last BM Date:  (pta)  Intake/Output   Yesterday:  10/09 0701 - 10/10 0700 In: 1020 [P.O.:120; I.V.:900] Out: 1300 [Urine:1300] This shift:     Bowel function:  Flatus: scant  BM: no  Drain: n/a  Physical Exam:  General: Pt awake/alert/oriented x4 in no acute distress Eyes: PERRL, normal EOM.  Sclera clear.  No icterus Neuro: CN II-XII intact w/o focal sensory/motor deficits. Lymph: No head/neck/groin lymphadenopathy Psych:  No delerium/psychosis/paranoia HENT: Normocephalic, Mucus membranes moist.  No thrush Neck: Supple, No tracheal deviation Chest: No chest wall pain w good excursion CV:  Pulses intact.  Regular  rhythm MS: Normal AROM mjr joints.  No obvious deformity Abdomen: Soft.  Nondistended.  Mildly tender at incisions only.  No evidence of peritonitis.  No incarcerated hernias. Ext:  SCDs BLE.  No mjr edema.  No cyanosis Skin: No petechiae / purpura  Results:   Labs: No results found for this or any previous visit (from the past 48 hour(s)).  Imaging / Studies: No results found.  Medications / Allergies: per chart  Antibiotics: Anti-infectives    Start     Dose/Rate Route Frequency Ordered Stop   12/08/14 2000  ceFAZolin (ANCEF) IVPB 2 g/50 mL premix     2 g 100 mL/hr over 30 Minutes Intravenous 3 times per day 12/08/14 1332 12/09/14 0613   12/08/14 1030  ceFAZolin (ANCEF) 3 g in dextrose 5 % 50 mL IVPB     3 g 160 mL/hr over 30 Minutes Intravenous  Once 12/08/14 1025 12/08/14 1200   12/08/14 0600  ceFAZolin (ANCEF) 3 g in dextrose 5 % 50 mL IVPB  Status:  Discontinued     3 g 160 mL/hr over 30 Minutes Intravenous  Once 12/08/14 0552 12/08/14 0711   12/08/14 0600  ceFAZolin (ANCEF) 3 g in dextrose 5 % 50 mL IVPB     3 g 160 mL/hr over 30 Minutes Intravenous 60 min pre-op 12/07/14 1322 12/08/14 0730   12/08/14 0600  ceFAZolin (ANCEF) IVPB 2 g/50 mL premix  Status:  Discontinued     2 g 100 mL/hr over 30 Minutes Intravenous On call to O.R. 12/07/14 1321 12/07/14 1323  Note: Portions of this report may have been transcribed using voice recognition software. Every effort was made to ensure accuracy; however, inadvertent computerized transcription errors may be present.   Any transcriptional errors that result from this process are unintentional.     Adin Hector, M.D., F.A.C.S. Gastrointestinal and Minimally Invasive Surgery [facility] [address], [place], [place] [address] [phone] Main / Paging   12/11/2014  CARE TEAM:  PCP: Lorayne Marek, MD  Outpatient Care Team: Patient Care Team: Lorayne Marek, MD as PCP  - General (Internal Medicine) Michael Boston, MD as Consulting Physician (General Surgery) Woodroe Mode, MD as Consulting Physician (Obstetrics and Gynecology)  Inpatient Treatment Team: Treatment Team: Attending Provider: Michael Boston, MD; Consulting Physician: Woodroe Mode, MD; Technician: Abbe Amsterdam, NT; Registered Nurse: Arnold Long, RN; Registered Nurse: Daylene Posey, RN; Technician: Enis Gash, NT; Registered Nurse: Bailey Mech, RN

## 2014-12-12 ENCOUNTER — Inpatient Hospital Stay (HOSPITAL_COMMUNITY): Payer: Medicaid Other

## 2014-12-12 LAB — CBC
HEMATOCRIT: 23 % — AB (ref 36.0–46.0)
HEMOGLOBIN: 6.7 g/dL — AB (ref 12.0–15.0)
MCH: 17.5 pg — AB (ref 26.0–34.0)
MCHC: 29.1 g/dL — AB (ref 30.0–36.0)
MCV: 60.2 fL — ABNORMAL LOW (ref 78.0–100.0)
Platelets: 369 10*3/uL (ref 150–400)
RBC: 3.82 MIL/uL — ABNORMAL LOW (ref 3.87–5.11)
RDW: 20.1 % — ABNORMAL HIGH (ref 11.5–15.5)
WBC: 7.4 10*3/uL (ref 4.0–10.5)

## 2014-12-12 LAB — BASIC METABOLIC PANEL
Anion gap: 7 (ref 5–15)
BUN: 7 mg/dL (ref 6–20)
CHLORIDE: 103 mmol/L (ref 101–111)
CO2: 27 mmol/L (ref 22–32)
CREATININE: 1.05 mg/dL — AB (ref 0.44–1.00)
Calcium: 8.6 mg/dL — ABNORMAL LOW (ref 8.9–10.3)
GFR calc Af Amer: >60 mL/min (ref >60)
GFR calc non Af Amer: >60 mL/min (ref >60)
Glucose, Bld: 88 mg/dL (ref 65–99)
Potassium: 3.7 mmol/L (ref 3.5–5.1)
Sodium: 137 mmol/L (ref 135–145)

## 2014-12-12 MED ORDER — MORPHINE SULFATE (PF) 2 MG/ML IV SOLN
2.0000 mg | INTRAVENOUS | Status: DC | PRN
  Administered 2014-12-12: 2 mg via INTRAVENOUS
  Filled 2014-12-12: qty 1

## 2014-12-12 MED ORDER — SODIUM CHLORIDE 0.9 % IJ SOLN
3.0000 mL | Freq: Two times a day (BID) | INTRAMUSCULAR | Status: DC
  Administered 2014-12-14 – 2014-12-17 (×5): 3 mL via INTRAVENOUS

## 2014-12-12 MED ORDER — PSYLLIUM 95 % PO PACK
1.0000 | PACK | Freq: Every day | ORAL | Status: DC
  Administered 2014-12-12: 1 via ORAL
  Filled 2014-12-12 (×2): qty 1

## 2014-12-12 MED ORDER — SODIUM CHLORIDE 0.9 % IJ SOLN: 3.0000 mL | INTRAMUSCULAR | Status: DC | PRN

## 2014-12-12 MED ORDER — SACCHAROMYCES BOULARDII 250 MG PO CAPS
250.0000 mg | ORAL_CAPSULE | Freq: Two times a day (BID) | ORAL | Status: DC
  Administered 2014-12-12: 250 mg via ORAL
  Filled 2014-12-12 (×6): qty 1

## 2014-12-12 MED ORDER — ACETAMINOPHEN 500 MG PO TABS
1000.0000 mg | ORAL_TABLET | Freq: Three times a day (TID) | ORAL | Status: DC
  Administered 2014-12-12 – 2014-12-15 (×4): 1000 mg via ORAL
  Filled 2014-12-12 (×21): qty 2

## 2014-12-12 MED ORDER — SODIUM CHLORIDE 0.9 % IV SOLN: 250.0000 mL | INTRAVENOUS | Status: DC | PRN

## 2014-12-12 NOTE — Progress Notes (Signed)
1L dark emesis reported.  3rd emesis today.   NGT to LIWS & KUB ordered. Pt made NPO.

## 2014-12-12 NOTE — Progress Notes (Signed)
4 Days Post-Op Procedure(s) (LRB): LAPAROSCOPIC INCARCERATED INCISIONAL VENTRAL WALL HERNIA REPAIR (N/A) HYSTERECTOMY ABDOMINAL (N/A) LAPAROSCOPIC LYSIS OF ADHESIONS (N/A) LAPARSCOPIC SUPRA-UMBILICAL HERNIA REPAIR  (N/A)  Subjective: Patient reports incisional pain, tolerating PO and + flatus.    Objective: I have reviewed patient's vital signs, intake and output, medications and labs.  General: alert, cooperative and no distress GI: soft, non-tender; bowel sounds normal; no masses,  no organomegaly Extremities: extremities normal, atraumatic, no cyanosis or edema Vaginal Bleeding: minimal  Assessment: s/p Procedure(s): LAPAROSCOPIC INCARCERATED INCISIONAL VENTRAL WALL HERNIA REPAIR (N/A) HYSTERECTOMY ABDOMINAL (N/A) LAPAROSCOPIC LYSIS OF ADHESIONS (N/A) LAPARSCOPIC SUPRA-UMBILICAL HERNIA REPAIR  (N/A): stable, progressing well, tolerating diet and ileus resolving Anemia stable Plan: Advance diet Encourage ambulation  LOS: 3 days    Rebecca Johnston 12/12/2014, 8:41 AM

## 2014-12-12 NOTE — Care Management Note (Signed)
Case Management Note  Patient Details  Name: Rebecca Johnston MRN: [id number] Date of Birth: June 26, 1970  Subjective/Objective:   44 y/o f admitted w/Ventral hernia.POD#4 repair,ileus resolving.advance diet,ambulate.From home.                 Action/Plan:d/c plan home.   Expected Discharge Date:                  Expected Discharge Plan:  Home/Self Care  In-House Referral:     Discharge planning Services  CM Consult  Post Acute Care Choice:    Choice offered to:     DME Arranged:    DME Agency:     HH Arranged:    HH Agency:     Status of Service:  In process, will continue to follow  Medicare Important Message Given:    Date Medicare IM Given:    Medicare IM give by:    Date Additional Medicare IM Given:    Additional Medicare Important Message give by:     If discussed at [place] of Stay Meetings, dates discussed:    Additional Comments:  Dessa Phi, RN 12/12/2014, 1:14 PM

## 2014-12-12 NOTE — Progress Notes (Addendum)
[facility]  [address] [place], [place] [address] Phone: [phone] FAX: [place] [phone] 06/22/1970   Problem List:   Principal Problem:   Incarcerated incisional hernia Active Problems:   Fibroid, uterine   Incarcerated hernia   4 Days Post-Op  12/08/2014  Procedure(s): LAPAROSCOPIC INCARCERATED INCISIONAL VENTRAL WALL HERNIA REPAIR HYSTERECTOMY ABDOMINAL LAPAROSCOPIC LYSIS OF ADHESIONS LAPARSCOPIC SUPRA-UMBILICAL HERNIA REPAIR   Assessment  Ileus resolving  Plan:  -severe anemia - most likely from TAH with enoxaparin & NSAIDs - meds held.  Recheck.  Hold off on transfusion unless Hgb falling <6 or HD unstable  -pain control  -adv diet  Bowel regimen  -VTE prophylaxis- SCDs, etc  -mobilize as tolerated to help recovery.  GET HER UP!!  Adin Hector, M.D., F.A.C.S. Gastrointestinal and Minimally Invasive Surgery [facility] [address], [place], [place] [address] [phone] Main / Paging   12/12/2014  Subjective:  Sore Tol clears   Objective:  Vital signs:  Filed Vitals:   12/11/14 0530 12/11/14 1547 12/11/14 2115 12/12/14 0521  BP: 115/67 111/49 135/87 106/72  Pulse: 103 99 90 91  Temp: 98.3 F (36.8 C) 98.7 F (37.1 C) 98.5 F (36.9 C) 98.2 F (36.8 C)  TempSrc: Oral Oral Oral Oral  Resp: '18 20 18 20  ' Height:      Weight:      SpO2: 92% 99% 94% 94%    Last BM Date: 12/11/14  Intake/Output   Yesterday:  10/10 0701 - 10/11 0700 In: 2891.7 [P.O.:480; I.V.:2411.7] Out: 2000 [Urine:2000] This shift:  Total I/O In: 2000 [I.V.:2000] Out: 1200 [Urine:1200]  Bowel function:  Flatus: YES  BM: YES  Drain: n/a  Physical Exam:  General: Pt awake/alert/oriented x4 in no acute distress Eyes: PERRL, normal EOM.  Sclera clear.  No icterus Neuro: CN II-XII intact w/o focal sensory/motor deficits. Lymph: No  head/neck/groin lymphadenopathy Psych:  No delerium/psychosis/paranoia HENT: Normocephalic, Mucus membranes moist.  No thrush Neck: Supple, No tracheal deviation Chest: No chest wall pain w good excursion CV:  Pulses intact.  Regular rhythm MS: Normal AROM mjr joints.  No obvious deformity Abdomen: Soft.  Obese.  Nondistended.  Mildly tender at incisions only.  No evidence of peritonitis.  No incarcerated hernias. Ext:  SCDs BLE.  No mjr edema.  No cyanosis Skin: No petechiae / purpura  Results:   Labs: Results for orders placed or performed during the hospital encounter of 12/08/14 (from the past 48 hour(s))  Basic metabolic panel     Status: Abnormal   Collection Time: 12/11/14  8:06 AM  Result Value Ref Range   Sodium 137 135 - 145 mmol/L   Potassium 3.6 3.5 - 5.1 mmol/L   Chloride 103 101 - 111 mmol/L   CO2 27 22 - 32 mmol/L   Glucose, Bld 102 (H) 65 - 99 mg/dL   BUN 10 6 - 20 mg/dL   Creatinine, Ser 1.10 (H) 0.44 - 1.00 mg/dL   Calcium 8.7 (L) 8.9 - 10.3 mg/dL   GFR calc non Af Amer 60 (L) >60 mL/min   GFR calc Af Amer >60 >60 mL/min    Comment: (NOTE) The eGFR has been calculated using the CKD EPI equation. This calculation has not been validated in all clinical situations. eGFR's persistently <60 mL/min signify possible Chronic Kidney Disease.    Anion gap 7 5 - 15  CBC     Status: Abnormal  Collection Time: 12/11/14  8:06 AM  Result Value Ref Range   WBC 9.3 4.0 - 10.5 K/uL   RBC 3.78 (L) 3.87 - 5.11 MIL/uL   Hemoglobin 6.8 (LL) 12.0 - 15.0 g/dL    Comment: REPEATED TO VERIFY CRITICAL RESULT CALLED TO, READ BACK BY AND VERIFIED WITH: BRACEY RN AT 0815 ON 10.10.16 BY SHUEA    HCT 22.6 (L) 36.0 - 46.0 %   MCV 59.8 (L) 78.0 - 100.0 fL   MCH 18.0 (L) 26.0 - 34.0 pg   MCHC 30.1 30.0 - 36.0 g/dL   RDW 19.9 (H) 11.5 - 15.5 %   Platelets 377 150 - 400 K/uL    Imaging / Studies: No results found.  Medications / Allergies: per  chart  Antibiotics: Anti-infectives    Start     Dose/Rate Route Frequency Ordered Stop   12/08/14 2000  ceFAZolin (ANCEF) IVPB 2 g/50 mL premix     2 g 100 mL/hr over 30 Minutes Intravenous 3 times per day 12/08/14 1332 12/09/14 0613   12/08/14 1030  ceFAZolin (ANCEF) 3 g in dextrose 5 % 50 mL IVPB     3 g 160 mL/hr over 30 Minutes Intravenous  Once 12/08/14 1025 12/08/14 1200   12/08/14 0600  ceFAZolin (ANCEF) 3 g in dextrose 5 % 50 mL IVPB  Status:  Discontinued     3 g 160 mL/hr over 30 Minutes Intravenous  Once 12/08/14 0552 12/08/14 0711   12/08/14 0600  ceFAZolin (ANCEF) 3 g in dextrose 5 % 50 mL IVPB     3 g 160 mL/hr over 30 Minutes Intravenous 60 min pre-op 12/07/14 1322 12/08/14 0730   12/08/14 0600  ceFAZolin (ANCEF) IVPB 2 g/50 mL premix  Status:  Discontinued     2 g 100 mL/hr over 30 Minutes Intravenous On call to O.R. 12/07/14 1321 12/07/14 1323        Note: Portions of this report may have been transcribed using voice recognition software. Every effort was made to ensure accuracy; however, inadvertent computerized transcription errors may be present.   Any transcriptional errors that result from this process are unintentional.     Adin Hector, M.D., F.A.C.S. Gastrointestinal and Minimally Invasive Surgery [facility] [address] [place], [place] [address] [phone] Main / Paging   12/12/2014  CARE TEAM:  PCP: Lorayne Marek, MD  Outpatient Care Team: Patient Care Team: Lorayne Marek, MD as PCP - General (Internal Medicine) Michael Boston, MD as Consulting Physician (General Surgery) Woodroe Mode, MD as Consulting Physician (Obstetrics and Gynecology)  Inpatient Treatment Team: Treatment Team: Attending Provider: Michael Boston, MD; Consulting Physician: Woodroe Mode, MD; Technician: Abbe Amsterdam, NT; Registered Nurse: Arnold Long, RN; Registered Nurse: Daylene Posey, RN; Technician: Enis Gash, NT; Registered Nurse: Bailey Mech, RN; Technician: Gardiner Ramus, NT

## 2014-12-13 LAB — CREATININE, SERUM
Creatinine, Ser: 1.03 mg/dL — ABNORMAL HIGH (ref 0.44–1.00)
GFR calc Af Amer: >60 mL/min (ref >60)

## 2014-12-13 LAB — POTASSIUM: Potassium: 3.5 mmol/L (ref 3.5–5.1)

## 2014-12-13 LAB — HEMOGLOBIN: HEMOGLOBIN: 7 g/dL — AB (ref 12.0–15.0)

## 2014-12-13 MED ORDER — PANTOPRAZOLE SODIUM 40 MG IV SOLR
40.0000 mg | Freq: Two times a day (BID) | INTRAVENOUS | Status: DC
  Administered 2014-12-13 – 2014-12-17 (×9): 40 mg via INTRAVENOUS
  Filled 2014-12-13 (×10): qty 40

## 2014-12-13 MED ORDER — LACTATED RINGERS IV BOLUS (SEPSIS)
1000.0000 mL | Freq: Once | INTRAVENOUS | Status: AC
  Administered 2014-12-13: 1000 mL via INTRAVENOUS

## 2014-12-13 MED ORDER — FENTANYL CITRATE (PF) 100 MCG/2ML IJ SOLN: 25.0000 ug | INTRAMUSCULAR | Status: DC | PRN

## 2014-12-13 MED ORDER — FENTANYL CITRATE (PF) 100 MCG/2ML IJ SOLN
25.0000 ug | INTRAMUSCULAR | Status: DC | PRN
  Administered 2014-12-13 – 2014-12-14 (×4): 25 ug via INTRAVENOUS
  Filled 2014-12-13 (×3): qty 2

## 2014-12-13 MED ORDER — LACTATED RINGERS IV BOLUS (SEPSIS)
1000.0000 mL | Freq: Once | INTRAVENOUS | Status: AC
Start: 2014-12-13 — End: 2014-12-13
  Administered 2014-12-13: 1000 mL via INTRAVENOUS

## 2014-12-13 NOTE — Progress Notes (Signed)
[facility]  [address] [place], [place] [address] Phone: [phone] FAX: [place] [phone] [birth year]   Problem List:   Principal Problem:   Incarcerated incisional hernia Active Problems:   Fibroid, uterine   Incarcerated hernia   5 Days Post-Op  12/08/2014  Procedure(s): LAPAROSCOPIC INCARCERATED INCISIONAL VENTRAL WALL HERNIA REPAIR HYSTERECTOMY ABDOMINAL LAPAROSCOPIC LYSIS OF ADHESIONS LAPARSCOPIC SUPRA-UMBILICAL HERNIA REPAIR   Assessment  Ileus worse  Plan:  -severe anemia - most likely from TAH with enoxaparin & NSAIDs - meds held.  Stable but follow.  Hold off on transfusion unless Hgb falling <6 or HD unstable  -pain control - change narcotics w N/V  -NGT   -PPI w ?coffee gd emesis  -IVF bolus  -try enema w no response to supp.  AXR not too full of stool but suspect ileus more than anything  -VTE prophylaxis- SCDs, etc  -mobilize as tolerated to help recovery.  She is walking more - try to encourage  Adin Hector, M.D., F.A.C.S. Gastrointestinal and Minimally Invasive Surgery [facility] [address], [place], [place] [address] [phone] Main / Paging   12/13/2014  Subjective:  Vomiting NGT placed - 2 canisters filled Less abd pain Walking more No BM w supp  Objective:  Vital signs:  Filed Vitals:   12/12/14 0521 12/12/14 1400 12/12/14 2159 12/13/14 0550  BP: 106/72 125/67 115/67 130/80  Pulse: 91 118 121 116  Temp: 98.2 F (36.8 C) 98.7 F (37.1 C) 98.2 F (36.8 C) 99 F (37.2 C)  TempSrc: Oral Oral Oral Oral  Resp: _0 Height:      Weight:      SpO2: 94% 95% 97% 96%    Last BM Date: 12/12/14  Intake/Output   Yesterday:  10/11 0701 - 10/12 0700 In: 1111.3 [I.V.:1111.3] Out: 3051 [Urine:1100; Emesis/NG output:1951] This shift:  Total I/O In: 400 [I.V.:400] Out: 950 [Emesis/NG  output:950]  Bowel function:  Flatus: Yes  BM: No  Drain: Mod thick dark green  Physical Exam:  General: Pt awake/alert/oriented x4 in no acute distress Eyes: PERRL, normal EOM.  Sclera clear.  No icterus Neuro: CN II-XII intact w/o focal sensory/motor deficits. Lymph: No head/neck/groin lymphadenopathy Psych:  No delerium/psychosis/paranoia HENT: Normocephalic, Mucus membranes moist.  No thrush.  NGT in place Neck: Supple, No tracheal deviation Chest: No chest wall pain w good excursion CV:  Pulses intact.  Regular rhythm MS: Normal AROM mjr joints.  No obvious deformity Abdomen: Softer.  Obese.  Mildly distended.  Mildly tender at incisions only.  No evidence of peritonitis.  No incarcerated hernias. Ext:  SCDs BLE.  No mjr edema.  No cyanosis Skin: No petechiae / purpura  Results:   Labs: Results for orders placed or performed during the hospital encounter of 12/08/14 (from the past 48 hour(s))  Basic metabolic panel     Status: Abnormal   Collection Time: 12/11/14  8:06 AM  Result Value Ref Range   Sodium 137 135 - 145 mmol/L   Potassium 3.6 3.5 - 5.1 mmol/L   Chloride 103 101 - 111 mmol/L   CO2 27 22 - 32 mmol/L   Glucose, Bld 102 (H) 65 - 99 mg/dL   BUN 10 6 - 20 mg/dL   Creatinine, Ser 1.10 (H) 0.44 - 1.00 mg/dL   Calcium 8.7 (L) 8.9 - 10.3 mg/dL   GFR calc non Af Amer 60 (L) >60  mL/min   GFR calc Af Amer >60 >60 mL/min    Comment: (NOTE) The eGFR has been calculated using the CKD EPI equation. This calculation has not been validated in all clinical situations. eGFR's persistently <60 mL/min signify possible Chronic Kidney Disease.    Anion gap 7 5 - 15  CBC     Status: Abnormal   Collection Time: 12/11/14  8:06 AM  Result Value Ref Range   WBC 9.3 4.0 - 10.5 K/uL   RBC 3.78 (L) 3.87 - 5.11 MIL/uL   Hemoglobin 6.8 (LL) 12.0 - 15.0 g/dL    Comment: REPEATED TO VERIFY CRITICAL RESULT CALLED TO, READ BACK BY AND VERIFIED WITH: BRACEY RN AT 0815 ON 10.10.16  BY SHUEA    HCT 22.6 (L) 36.0 - 46.0 %   MCV 59.8 (L) 78.0 - 100.0 fL   MCH 18.0 (L) 26.0 - 34.0 pg   MCHC 30.1 30.0 - 36.0 g/dL   RDW 19.9 (H) 11.5 - 15.5 %   Platelets 377 150 - 400 K/uL  CBC     Status: Abnormal   Collection Time: 12/12/14  7:00 AM  Result Value Ref Range   WBC 7.4 4.0 - 10.5 K/uL   RBC 3.82 (L) 3.87 - 5.11 MIL/uL   Hemoglobin 6.7 (LL) 12.0 - 15.0 g/dL    Comment: CRITICAL VALUE NOTED.  VALUE IS CONSISTENT WITH PREVIOUSLY REPORTED AND CALLED VALUE.   HCT 23.0 (L) 36.0 - 46.0 %   MCV 60.2 (L) 78.0 - 100.0 fL   MCH 17.5 (L) 26.0 - 34.0 pg   MCHC 29.1 (L) 30.0 - 36.0 g/dL   RDW 20.1 (H) 11.5 - 15.5 %   Platelets 369 150 - 400 K/uL  Basic metabolic panel     Status: Abnormal   Collection Time: 12/12/14  7:00 AM  Result Value Ref Range   Sodium 137 135 - 145 mmol/L   Potassium 3.7 3.5 - 5.1 mmol/L   Chloride 103 101 - 111 mmol/L   CO2 27 22 - 32 mmol/L   Glucose, Bld 88 65 - 99 mg/dL   BUN 7 6 - 20 mg/dL   Creatinine, Ser 1.05 (H) 0.44 - 1.00 mg/dL   Calcium 8.6 (L) 8.9 - 10.3 mg/dL   GFR calc non Af Amer >60 >60 mL/min   GFR calc Af Amer >60 >60 mL/min    Comment: (NOTE) The eGFR has been calculated using the CKD EPI equation. This calculation has not been validated in all clinical situations. eGFR's persistently <60 mL/min signify possible Chronic Kidney Disease.    Anion gap 7 5 - 15    Imaging / Studies: Dg Abd 1 View  12/12/2014  CLINICAL DATA:  Recent ventral wall hernia repair with nausea and vomiting EXAM: ABDOMEN - 1 VIEW COMPARISON:  08/29/2014 FINDINGS: Scattered large and small bowel gas is noted. Dilated loops of small bowel are noted throughout the mid abdomen. Colonic air is noted in these changes likely represent a partial small bowel obstruction or small-bowel ileus. No free air is seen. No other focal abnormality is noted. IMPRESSION: Small bowel dilatation likely representing of partial small bowel obstruction or small-bowel ileus.  Correlation with the physical exam is recommended. Electronically Signed   By: Inez Catalina M.D.   On: 12/12/2014 19:32    Medications / Allergies: per chart  Antibiotics: Anti-infectives    Start     Dose/Rate Route Frequency Ordered Stop   12/08/14 2000  ceFAZolin (ANCEF) IVPB 2 g/50  mL premix     2 g 100 mL/hr over 30 Minutes Intravenous 3 times per day 12/08/14 1332 12/09/14 0613   12/08/14 1030  ceFAZolin (ANCEF) 3 g in dextrose 5 % 50 mL IVPB     3 g 160 mL/hr over 30 Minutes Intravenous  Once 12/08/14 1025 12/08/14 1200   12/08/14 0600  ceFAZolin (ANCEF) 3 g in dextrose 5 % 50 mL IVPB  Status:  Discontinued     3 g 160 mL/hr over 30 Minutes Intravenous  Once 12/08/14 0552 12/08/14 0711   12/08/14 0600  ceFAZolin (ANCEF) 3 g in dextrose 5 % 50 mL IVPB     3 g 160 mL/hr over 30 Minutes Intravenous 60 min pre-op 12/07/14 1322 12/08/14 0730   12/08/14 0600  ceFAZolin (ANCEF) IVPB 2 g/50 mL premix  Status:  Discontinued     2 g 100 mL/hr over 30 Minutes Intravenous On call to O.R. 12/07/14 1321 12/07/14 1323        Note: Portions of this report may have been transcribed using voice recognition software. Every effort was made to ensure accuracy; however, inadvertent computerized transcription errors may be present.   Any transcriptional errors that result from this process are unintentional.     Adin Hector, M.D., F.A.C.S. Gastrointestinal and Minimally Invasive Surgery [facility] [address], [place], [place] [address] [phone] Main / Paging   12/13/2014  CARE TEAM:  PCP: Lorayne Marek, MD  Outpatient Care Team: Patient Care Team: Lorayne Marek, MD as PCP - General (Internal Medicine) Michael Boston, MD as Consulting Physician (General Surgery) Woodroe Mode, MD as Consulting Physician (Obstetrics and Gynecology)  Inpatient Treatment Team: Treatment Team: Attending Provider: Michael Boston, MD; Consulting Physician:  Woodroe Mode, MD; Technician: Abbe Amsterdam, NT; Registered Nurse: Daylene Posey, RN; Technician: Enis Gash, NT; Registered Nurse: Bailey Mech, RN; Technician: Gardiner Ramus, NT; Technician: Leda Quail, NT; Registered Nurse: Charlena Cross, RN; Technician: Coralie Carpen, NT

## 2014-12-13 NOTE — Progress Notes (Signed)
Dr. Johney Maine in and ordered soap suds enema and bolus of LR.  IV hung @500cc /hr.  Soap suds enema given w/ return of smal amt of green, soft stool and large amt of  green/brown liquid.

## 2014-12-13 NOTE — Progress Notes (Signed)
5 Days Post-Op Procedure(s) (LRB): LAPAROSCOPIC INCARCERATED INCISIONAL VENTRAL WALL HERNIA REPAIR (N/A) HYSTERECTOMY ABDOMINAL (N/A) LAPAROSCOPIC LYSIS OF ADHESIONS (N/A) LAPARSCOPIC SUPRA-UMBILICAL HERNIA REPAIR  (N/A)  Subjective: Patient reports + flatus and + BM.  atter enema, suppository  Objective: I have reviewed patient's vital signs, intake and output, medications and labs.  General: alert, cooperative and no distress GI: dressing intact, soft, NT Extremities: extremities normal, atraumatic, no cyanosis or edema  Assessment: s/p Procedure(s): LAPAROSCOPIC INCARCERATED INCISIONAL VENTRAL WALL HERNIA REPAIR (N/A) HYSTERECTOMY ABDOMINAL (N/A) LAPAROSCOPIC LYSIS OF ADHESIONS (N/A) LAPARSCOPIC SUPRA-UMBILICAL HERNIA REPAIR  (N/A): ileus present  Plan: NPO  LOS: 4 days    Rebecca Johnston 12/13/2014, 12:23 PM

## 2014-12-14 LAB — HEMOGLOBIN: Hemoglobin: 6.8 g/dL — CL (ref 12.0–15.0)

## 2014-12-14 MED ORDER — MENTHOL 3 MG MT LOZG
1.0000 | LOZENGE | OROMUCOSAL | Status: DC | PRN
  Filled 2014-12-14: qty 9

## 2014-12-14 MED ORDER — LACTATED RINGERS IV BOLUS (SEPSIS): 1000.0000 mL | Freq: Three times a day (TID) | INTRAVENOUS | Status: AC | PRN

## 2014-12-14 MED ORDER — LACTATED RINGERS IV BOLUS (SEPSIS)
1000.0000 mL | Freq: Once | INTRAVENOUS | Status: AC
Start: 2014-12-14 — End: 2014-12-14
  Administered 2014-12-14: 1000 mL via INTRAVENOUS

## 2014-12-14 MED ORDER — FENTANYL CITRATE (PF) 100 MCG/2ML IJ SOLN
50.0000 ug | INTRAMUSCULAR | Status: DC | PRN
  Administered 2014-12-14 (×2): 50 ug via INTRAVENOUS
  Filled 2014-12-14 (×2): qty 2

## 2014-12-14 MED ORDER — ENOXAPARIN SODIUM 40 MG/0.4ML ~~LOC~~ SOLN
40.0000 mg | SUBCUTANEOUS | Status: DC
  Administered 2014-12-14: 40 mg via SUBCUTANEOUS
  Filled 2014-12-14 (×3): qty 0.4

## 2014-12-14 NOTE — Progress Notes (Signed)
[facility]  [address] [place], [place] [address] Phone: [phone] FAX: [place] [phone] [birth year]   Problem List:   Principal Problem:   Incarcerated incisional hernia Active Problems:   Fibroid, uterine   Incarcerated hernia   6 Days Post-Op  12/08/2014  Procedure(s): LAPAROSCOPIC INCARCERATED INCISIONAL VENTRAL WALL HERNIA REPAIR HYSTERECTOMY ABDOMINAL LAPAROSCOPIC LYSIS OF ADHESIONS LAPARSCOPIC SUPRA-UMBILICAL HERNIA REPAIR   Assessment  Ileus resolving  Plan:  -Anemia - most likely from TAH with enoxaparin & NSAIDs - meds held.  Stable.  Hold off on transfusion unless Hgb falling <6 or HD unstable  -pain control - OK w IV fentanyl - inc dose.  Cont tylenol & ice/heat.  Robaxin PRN  -NGT clamping trial  -PPI w ?coffee gd emesis  -IVF bolus  -VTE prophylaxis- SCDs, etc.  Retry enoxaparin but follow Hgb closely  -mobilize as tolerated to help recovery.  She is walking more - try to encourage  Adin Hector, M.D., F.A.C.S. Gastrointestinal and Minimally Invasive Surgery [facility] [address], [place], [place] [address] [phone] Main / Paging   12/14/2014  Subjective:  Sore but fentanyl helps NGT output less Enema = BMs  Objective:  Vital signs:  Filed Vitals:   12/13/14 0550 12/13/14 1400 12/13/14 2139 12/14/14 0629  BP: 130/80 115/69 138/82 125/74  Pulse: 116 71 115 112  Temp: 99 F (37.2 C) 98.2 F (36.8 C) 98.5 F (36.9 C) 98.4 F (36.9 C)  TempSrc: Oral Oral Oral Oral  Resp: _0 Height:      Weight:      SpO2: 96% 100% 98% 98%    Last BM Date: 12/13/14  Intake/Output   Yesterday:  10/12 0701 - 10/13 0700 In: 3625 [I.V.:2625; IV Piggyback:1000] Out: 2105 [Urine:903; Emesis/NG output:1000; Stool:202] This shift:  Total I/O In: 1500 [I.V.:1500] Out: 405 [Urine:3; Emesis/NG output:400;  Stool:2]  Bowel function:  Flatus: Yes  BM: Yes  Drain: Thin light green  Physical Exam:  General: Pt awake/alert/oriented x4 in no acute distress.  Tired but not toxic Eyes: PERRL, normal EOM.  Sclera clear.  No icterus Neuro: CN II-XII intact w/o focal sensory/motor deficits. Lymph: No head/neck/groin lymphadenopathy Psych:  No delerium/psychosis/paranoia HENT: Normocephalic, Mucus membranes moist.  No thrush.  NGT in place Neck: Supple, No tracheal deviation Chest: No chest wall pain w good excursion CV:  Pulses intact.  Regular rhythm MS: Normal AROM mjr joints.  No obvious deformity Abdomen: Softer.  Obese.  Mildly distended.  Mildly tender at RLQ incisions only.  No evidence of peritonitis.  No incarcerated hernias.  Pfannenstiel incision old dressing removed - no cellulitis. Ext:  SCDs BLE.  No mjr edema.  No cyanosis Skin: No petechiae / purpura  Results:   Labs: Results for orders placed or performed during the hospital encounter of 12/08/14 (from the past 48 hour(s))  CBC     Status: Abnormal   Collection Time: 12/12/14  7:00 AM  Result Value Ref Range   WBC 7.4 4.0 - 10.5 K/uL   RBC 3.82 (L) 3.87 - 5.11 MIL/uL   Hemoglobin 6.7 (LL) 12.0 - 15.0 g/dL    Comment: CRITICAL VALUE NOTED.  VALUE IS CONSISTENT WITH PREVIOUSLY REPORTED AND CALLED VALUE.   HCT 23.0 (L) 36.0 - 46.0 %   MCV 60.2 (L) 78.0 - 100.0 fL   MCH 17.5 (L) 26.0 - 34.0 pg   MCHC  29.1 (L) 30.0 - 36.0 g/dL   RDW 20.1 (H) 11.5 - 15.5 %   Platelets 369 150 - 400 K/uL  Basic metabolic panel     Status: Abnormal   Collection Time: 12/12/14  7:00 AM  Result Value Ref Range   Sodium 137 135 - 145 mmol/L   Potassium 3.7 3.5 - 5.1 mmol/L   Chloride 103 101 - 111 mmol/L   CO2 27 22 - 32 mmol/L   Glucose, Bld 88 65 - 99 mg/dL   BUN 7 6 - 20 mg/dL   Creatinine, Ser 1.05 (H) 0.44 - 1.00 mg/dL   Calcium 8.6 (L) 8.9 - 10.3 mg/dL   GFR calc non Af Amer >60 >60 mL/min   GFR calc Af Amer >60 >60 mL/min     Comment: (NOTE) The eGFR has been calculated using the CKD EPI equation. This calculation has not been validated in all clinical situations. eGFR's persistently <60 mL/min signify possible Chronic Kidney Disease.    Anion gap 7 5 - 15  Hemoglobin     Status: Abnormal   Collection Time: 12/13/14  5:25 AM  Result Value Ref Range   Hemoglobin 7.0 (L) 12.0 - 15.0 g/dL  Potassium     Status: None   Collection Time: 12/13/14  5:25 AM  Result Value Ref Range   Potassium 3.5 3.5 - 5.1 mmol/L  Creatinine, serum     Status: Abnormal   Collection Time: 12/13/14  5:25 AM  Result Value Ref Range   Creatinine, Ser 1.03 (H) 0.44 - 1.00 mg/dL   GFR calc non Af Amer >60 >60 mL/min   GFR calc Af Amer >60 >60 mL/min    Comment: (NOTE) The eGFR has been calculated using the CKD EPI equation. This calculation has not been validated in all clinical situations. eGFR's persistently <60 mL/min signify possible Chronic Kidney Disease.   Hemoglobin     Status: Abnormal   Collection Time: 12/14/14  4:53 AM  Result Value Ref Range   Hemoglobin 6.8 (LL) 12.0 - 15.0 g/dL    Comment: REPEATED TO VERIFY CRITICAL RESULT CALLED TO, READ BACK BY AND VERIFIED WITH: DEBREW,M/5W _0  ON 12/14/14 BY KARCZEWSKI,S.     Imaging / Studies: Dg Abd 1 View  12/12/2014  CLINICAL DATA:  Recent ventral wall hernia repair with nausea and vomiting EXAM: ABDOMEN - 1 VIEW COMPARISON:  08/29/2014 FINDINGS: Scattered large and small bowel gas is noted. Dilated loops of small bowel are noted throughout the mid abdomen. Colonic air is noted in these changes likely represent a partial small bowel obstruction or small-bowel ileus. No free air is seen. No other focal abnormality is noted. IMPRESSION: Small bowel dilatation likely representing of partial small bowel obstruction or small-bowel ileus. Correlation with the physical exam is recommended. Electronically Signed   By: Inez Catalina M.D.   On: 12/12/2014 19:32    Medications /  Allergies: per chart  Antibiotics: Anti-infectives    Start     Dose/Rate Route Frequency Ordered Stop   12/08/14 2000  ceFAZolin (ANCEF) IVPB 2 g/50 mL premix     2 g 100 mL/hr over 30 Minutes Intravenous 3 times per day 12/08/14 1332 12/09/14 0613   12/08/14 1030  ceFAZolin (ANCEF) 3 g in dextrose 5 % 50 mL IVPB     3 g 160 mL/hr over 30 Minutes Intravenous  Once 12/08/14 1025 12/08/14 1200   12/08/14 0600  ceFAZolin (ANCEF) 3 g in dextrose 5 % 50 mL IVPB  Status:  Discontinued     3 g 160 mL/hr over 30 Minutes Intravenous  Once 12/08/14 0552 12/08/14 0711   12/08/14 0600  ceFAZolin (ANCEF) 3 g in dextrose 5 % 50 mL IVPB     3 g 160 mL/hr over 30 Minutes Intravenous 60 min pre-op 12/07/14 1322 12/08/14 0730   12/08/14 0600  ceFAZolin (ANCEF) IVPB 2 g/50 mL premix  Status:  Discontinued     2 g 100 mL/hr over 30 Minutes Intravenous On call to O.R. 12/07/14 1321 12/07/14 1323        Note: Portions of this report may have been transcribed using voice recognition software. Every effort was made to ensure accuracy; however, inadvertent computerized transcription errors may be present.   Any transcriptional errors that result from this process are unintentional.     Adin Hector, M.D., F.A.C.S. Gastrointestinal and Minimally Invasive Surgery [facility] [address] [place], [place] [address] [phone] Main / Paging   12/14/2014  CARE TEAM:  PCP: Lorayne Marek, MD  Outpatient Care Team: Patient Care Team: Lorayne Marek, MD as PCP - General (Internal Medicine) Michael Boston, MD as Consulting Physician (General Surgery) Woodroe Mode, MD as Consulting Physician (Obstetrics and Gynecology)  Inpatient Treatment Team: Treatment Team: Attending Provider: Michael Boston, MD; Consulting Physician: Woodroe Mode, MD; Technician: Abbe Amsterdam, NT; Registered Nurse: Daylene Posey, RN; Technician: Enis Gash, NT; Registered  Nurse: Bailey Mech, RN; Technician: Gardiner Ramus, NT; Technician: Leda Quail, NT; Registered Nurse: Charlena Cross, RN; Respiratory Therapist: Nelly Laurence, RRT; Technician: Coralie Carpen, NT

## 2014-12-14 NOTE — Progress Notes (Signed)
CRITICAL VALUE ALERT  Critical value received:  Hgb 6.8  Date of notification:  12/14/14  Time of notification:  2763  Critical value read back:Yes.    Nurse who received alert:  M. Debrew  MD notified (1st page):  Dr. Marcello Moores  Time of first page:  0550  MD notified (2nd page):  Time of second page:  Responding MD:  Dr. Marcello Moores  Time MD responded:  [phone]

## 2014-12-15 LAB — CBC
HEMATOCRIT: 22.3 % — AB (ref 36.0–46.0)
Hemoglobin: 6.6 g/dL — CL (ref 12.0–15.0)
MCH: 17.8 pg — ABNORMAL LOW (ref 26.0–34.0)
MCHC: 29.6 g/dL — AB (ref 30.0–36.0)
MCV: 60.1 fL — ABNORMAL LOW (ref 78.0–100.0)
PLATELETS: 450 10*3/uL — AB (ref 150–400)
RBC: 3.71 MIL/uL — ABNORMAL LOW (ref 3.87–5.11)
RDW: 21.1 % — AB (ref 11.5–15.5)
WBC: 10.8 10*3/uL — ABNORMAL HIGH (ref 4.0–10.5)

## 2014-12-15 LAB — BASIC METABOLIC PANEL
Anion gap: 12 (ref 5–15)
BUN: 11 mg/dL (ref 6–20)
CO2: 23 mmol/L (ref 22–32)
CREATININE: 1.02 mg/dL — AB (ref 0.44–1.00)
Calcium: 8.6 mg/dL — ABNORMAL LOW (ref 8.9–10.3)
Chloride: 101 mmol/L (ref 101–111)
GFR calc non Af Amer: >60 mL/min (ref >60)
Glucose, Bld: 86 mg/dL (ref 65–99)
Potassium: 3.6 mmol/L (ref 3.5–5.1)
Sodium: 136 mmol/L (ref 135–145)

## 2014-12-15 MED ORDER — LACTATED RINGERS IV BOLUS (SEPSIS): 1000.0000 mL | Freq: Once | INTRAVENOUS | Status: DC

## 2014-12-15 MED ORDER — ALUM & MAG HYDROXIDE-SIMETH 200-200-20 MG/5ML PO SUSP
30.0000 mL | Freq: Three times a day (TID) | ORAL | Status: AC
  Administered 2014-12-15 – 2014-12-16 (×3): 30 mL via ORAL
  Filled 2014-12-15 (×3): qty 30

## 2014-12-15 MED ORDER — FENTANYL CITRATE (PF) 100 MCG/2ML IJ SOLN: 50.0000 ug | INTRAMUSCULAR | Status: DC | PRN

## 2014-12-15 MED ORDER — TRAMADOL HCL 50 MG PO TABS
50.0000 mg | ORAL_TABLET | Freq: Four times a day (QID) | ORAL | Status: DC | PRN
  Administered 2014-12-16: 50 mg via ORAL
  Administered 2014-12-17: 100 mg via ORAL
  Filled 2014-12-15: qty 1
  Filled 2014-12-15: qty 2

## 2014-12-15 MED ORDER — SACCHAROMYCES BOULARDII 250 MG PO CAPS
250.0000 mg | ORAL_CAPSULE | Freq: Two times a day (BID) | ORAL | Status: DC
  Administered 2014-12-15 – 2014-12-17 (×3): 250 mg via ORAL
  Filled 2014-12-15 (×6): qty 1

## 2014-12-15 MED ORDER — POLYETHYLENE GLYCOL 3350 17 G PO PACK
17.0000 g | PACK | Freq: Every day | ORAL | Status: DC
  Administered 2014-12-15: 17 g via ORAL
  Filled 2014-12-15 (×2): qty 1

## 2014-12-15 NOTE — Progress Notes (Signed)
[facility]  [address] [place], [place] [address] Phone: [phone] FAX: [place] [phone] Feb 19, 1971   Problem List:   Principal Problem:   Fibroid, uterine with menorrhagia s/p TAH 12/08/2014 Active Problems:   Anemia   Smoking   Esophageal reflux   Incarcerated hernia   7 Days Post-Op  12/08/2014  Procedure(s): LAPAROSCOPIC INCARCERATED INCISIONAL VENTRAL WALL HERNIA REPAIR HYSTERECTOMY ABDOMINAL LAPAROSCOPIC LYSIS OF ADHESIONS LAPARSCOPIC SUPRA-UMBILICAL HERNIA REPAIR   Assessment  Ileus resolving  Plan:  -NGT removed - adv to pureed & then solids  -Anemia - most likely from TAH with enoxaparin & NSAIDs - meds held.  Stable.  Hold off on transfusion unless Hgb falling <6 or HD unstable  -pain control - OK w IV fentanyl - inc dose.  Cont tylenol & ice/heat.  Robaxin PRN.  Try tramadol if taking PO well  -maalox for gassiness  -bowel regimen   -PPI w ?coffee gd emesis  -IVF bolus PRN  -VTE prophylaxis- SCDs, etc.  Hgb fell w enoxaparin so hold again  -mobilize as tolerated to help recovery.  She is walking more - try to encourage  Adin Hector, M.D., F.A.C.S. Gastrointestinal and Minimally Invasive Surgery [facility] [address] [place], [place] [address] [phone] Main / Paging   12/15/2014  Subjective:  Sore but fentanyl helps NGT output less - tol clears w clamping Walking more  Objective:  Vital signs:  Filed Vitals:   12/14/14 0629 12/14/14 1400 12/14/14 2200 12/15/14 0525  BP: 125/74 131/80 127/78 131/85  Pulse: 112 99 113 108  Temp: 98.4 F (36.9 C) 97.7 F (36.5 C) 98.5 F (36.9 C) 99.2 F (37.3 C)  TempSrc: Oral Oral Oral Oral  Resp: _0 Height:      Weight:      SpO2: 98% 98% 95% 97%    Last BM Date: 12/14/14  Intake/Output   Yesterday:  10/13 0701 - 10/14 0700 In: 6222 [P.O.:320;  I.V.:3000] Out: 1200 [Urine:200; Stool:1000] This shift:     Bowel function:  Flatus: Yes  BM: Yes - manny loose  Drain: Thin light green  Physical Exam:  General: Pt awake/alert/oriented x4 in no acute distress.  Tired but not toxic Eyes: PERRL, normal EOM.  Sclera clear.  No icterus Neuro: CN II-XII intact w/o focal sensory/motor deficits. Lymph: No head/neck/groin lymphadenopathy Psych:  No delerium/psychosis/paranoia HENT: Normocephalic, Mucus membranes moist.  No thrush.  NGT in place Neck: Supple, No tracheal deviation Chest: No chest wall pain w good excursion CV:  Pulses intact.  Regular rhythm MS: Normal AROM mjr joints.  No obvious deformity Abdomen: Softer.  Obese.  Non distended.  Mildly tender at RLQ incisions only.  Incisions c/d/i - old serous fluid RLQ noted.   No evidence of peritonitis.  No incarcerated hernias.  Pfannenstiel incision old dressing removed - no cellulitis. Ext:  SCDs BLE.  No mjr edema.  No cyanosis Skin: No petechiae / purpura  Results:   Labs: Results for orders placed or performed during the hospital encounter of 12/08/14 (from the past 48 hour(s))  Hemoglobin     Status: Abnormal   Collection Time: 12/14/14  4:53 AM  Result Value Ref Range   Hemoglobin 6.8 (LL) 12.0 - 15.0 g/dL    Comment: REPEATED TO VERIFY CRITICAL RESULT CALLED TO, READ BACK BY AND VERIFIED WITH: DEBREW,M/5W _1  ON 12/14/14 BY KARCZEWSKI,S.   CBC  Status: Abnormal   Collection Time: 12/15/14  5:40 AM  Result Value Ref Range   WBC 10.8 (H) 4.0 - 10.5 K/uL   RBC 3.71 (L) 3.87 - 5.11 MIL/uL   Hemoglobin 6.6 (LL) 12.0 - 15.0 g/dL    Comment: REPEATED TO VERIFY CRITICAL VALUE NOTED.  VALUE IS CONSISTENT WITH PREVIOUSLY REPORTED AND CALLED VALUE.    HCT 22.3 (L) 36.0 - 46.0 %   MCV 60.1 (L) 78.0 - 100.0 fL   MCH 17.8 (L) 26.0 - 34.0 pg   MCHC 29.6 (L) 30.0 - 36.0 g/dL   RDW 21.1 (H) 11.5 - 15.5 %   Platelets 450 (H) 150 - 400 K/uL  Basic metabolic panel      Status: Abnormal   Collection Time: 12/15/14  5:40 AM  Result Value Ref Range   Sodium 136 135 - 145 mmol/L   Potassium 3.6 3.5 - 5.1 mmol/L   Chloride 101 101 - 111 mmol/L   CO2 23 22 - 32 mmol/L   Glucose, Bld 86 65 - 99 mg/dL   BUN 11 6 - 20 mg/dL   Creatinine, Ser 1.02 (H) 0.44 - 1.00 mg/dL   Calcium 8.6 (L) 8.9 - 10.3 mg/dL   GFR calc non Af Amer >60 >60 mL/min   GFR calc Af Amer >60 >60 mL/min    Comment: (NOTE) The eGFR has been calculated using the CKD EPI equation. This calculation has not been validated in all clinical situations. eGFR's persistently <60 mL/min signify possible Chronic Kidney Disease.    Anion gap 12 5 - 15    Imaging / Studies: No results found.  Medications / Allergies: per chart  Antibiotics: Anti-infectives    Start     Dose/Rate Route Frequency Ordered Stop   12/08/14 2000  ceFAZolin (ANCEF) IVPB 2 g/50 mL premix     2 g 100 mL/hr over 30 Minutes Intravenous 3 times per day 12/08/14 1332 12/09/14 0613   12/08/14 1030  ceFAZolin (ANCEF) 3 g in dextrose 5 % 50 mL IVPB     3 g 160 mL/hr over 30 Minutes Intravenous  Once 12/08/14 1025 12/08/14 1200   12/08/14 0600  ceFAZolin (ANCEF) 3 g in dextrose 5 % 50 mL IVPB  Status:  Discontinued     3 g 160 mL/hr over 30 Minutes Intravenous  Once 12/08/14 0552 12/08/14 0711   12/08/14 0600  ceFAZolin (ANCEF) 3 g in dextrose 5 % 50 mL IVPB     3 g 160 mL/hr over 30 Minutes Intravenous 60 min pre-op 12/07/14 1322 12/08/14 0730   12/08/14 0600  ceFAZolin (ANCEF) IVPB 2 g/50 mL premix  Status:  Discontinued     2 g 100 mL/hr over 30 Minutes Intravenous On call to O.R. 12/07/14 1321 12/07/14 1323        Note: Portions of this report may have been transcribed using voice recognition software. Every effort was made to ensure accuracy; however, inadvertent computerized transcription errors may be present.   Any transcriptional errors that result from this process are unintentional.     Adin Hector, M.D., F.A.C.S. Gastrointestinal and Minimally Invasive Surgery [facility] [address] [place], [place] [address] [phone] Main / Paging   12/15/2014  CARE TEAM:  PCP: Lorayne Marek, MD  Outpatient Care Team: Patient Care Team: Lorayne Marek, MD as PCP - General (Internal Medicine) Michael Boston, MD as Consulting Physician (General Surgery) Woodroe Mode, MD as Consulting Physician (Obstetrics  and Gynecology)  Inpatient Treatment Team: Treatment Team: Attending Provider: Michael Boston, MD; Consulting Physician: Woodroe Mode, MD; Technician: Abbe Amsterdam, NT; Registered Nurse: Daylene Posey, RN; Technician: Enis Gash, NT; Registered Nurse: Bailey Mech, RN; Technician: Leda Quail, NT; Registered Nurse: Charlena Cross, RN; Technician: Coralie Carpen, NT; Technician: Jefm Bryant, NT

## 2014-12-15 NOTE — Progress Notes (Signed)
7 Days Post-Op Procedure(s) (LRB): LAPAROSCOPIC INCARCERATED INCISIONAL VENTRAL WALL HERNIA REPAIR (N/A) HYSTERECTOMY ABDOMINAL (N/A) LAPAROSCOPIC LYSIS OF ADHESIONS (N/A) LAPARSCOPIC SUPRA-UMBILICAL HERNIA REPAIR  (N/A)  Subjective: Patient reports tolerating PO, + flatus, + BM and no problems voiding.    Objective: I have reviewed patient's vital signs, intake and output, medications and labs.  General: alert, cooperative and no distress GI: soft, non-tender; bowel sounds normal; no masses,  no organomegaly and incision: clean, dry and intact Extremities: extremities normal, atraumatic, no cyanosis or edema CBC    Component Value Date/Time   WBC 10.8* 12/15/2014 0540   RBC 3.71* 12/15/2014 0540   RBC 4.74 04/18/2013 0545   HGB 6.6* 12/15/2014 0540   HCT 22.3* 12/15/2014 0540   PLT 450* 12/15/2014 0540   MCV 60.1* 12/15/2014 0540   MCH 17.8* 12/15/2014 0540   MCHC 29.6* 12/15/2014 0540   RDW 21.1* 12/15/2014 0540   LYMPHSABS 1.5 05/30/2013 0939   MONOABS 1.0 05/30/2013 0939   EOSABS 0.2 05/30/2013 0939   BASOSABS 0.0 05/30/2013 0939     Assessment: s/p Procedure(s): LAPAROSCOPIC INCARCERATED INCISIONAL VENTRAL WALL HERNIA REPAIR (N/A) HYSTERECTOMY ABDOMINAL (N/A) LAPAROSCOPIC LYSIS OF ADHESIONS (N/A) LAPARSCOPIC SUPRA-UMBILICAL HERNIA REPAIR  (N/A): progressing well and tolerating diet Anemia stable and tolerated Plan: Encourage ambulation  LOS: 6 days    ARNOLD,JAMES 12/15/2014, 7:59 AM

## 2014-12-16 LAB — C DIFFICILE QUICK SCREEN W PCR REFLEX
C DIFFICILE (CDIFF) INTERP: NEGATIVE
C Diff antigen: NEGATIVE
C Diff toxin: NEGATIVE

## 2014-12-16 LAB — HEMOGLOBIN: HEMOGLOBIN: 6.5 g/dL — AB (ref 12.0–15.0)

## 2014-12-16 MED ORDER — FERROUS SULFATE 325 (65 FE) MG PO TABS
325.0000 mg | ORAL_TABLET | Freq: Two times a day (BID) | ORAL | Status: DC
  Administered 2014-12-16 – 2014-12-17 (×2): 325 mg via ORAL
  Filled 2014-12-16 (×4): qty 1

## 2014-12-16 NOTE — Progress Notes (Signed)
8 Days Post-Op  Subjective: Having hourly loose stools.  Has refused laxatives.   Objective: Vital signs in last 24 hours: Temp:  [98.6 F (37 C)-99.8 F (37.7 C)] 98.6 F (37 C) (10/15 0550) Pulse Rate:  [99-110] 107 (10/15 0550) Resp:  [18] 18 (10/15 0550) BP: (119-121)/(63-74) 120/72 mmHg (10/15 0550) SpO2:  [97 %-99 %] 97 % (10/15 0550) Last BM Date: 12/15/14  Intake/Output from previous day: 10/14 0701 - 10/15 0700 In: 1252 [P.O.:120; I.V.:1132] Out: 3200 [Urine:1650; Stool:1550] Intake/Output this shift: Total I/O In: 200 [I.V.:200] Out: 650 [Urine:300; Stool:350]  PE: General- In NAD Abdomen-soft, incisions clean and intact  Lab Results:   Recent Labs  12/15/14 0540 12/16/14 0530  WBC 10.8*  --   HGB 6.6* 6.5*  HCT 22.3*  --   PLT 450*  --    BMET  Recent Labs  12/15/14 0540  NA 136  K 3.6  CL 101  CO2 23  GLUCOSE 86  BUN 11  CREATININE 1.02*  CALCIUM 8.6*   PT/INR No results for input(s): LABPROT, INR in the last 72 hours. Comprehensive Metabolic Panel:    Component Value Date/Time   NA 136 12/15/2014 0540   NA 137 12/12/2014 0700   K 3.6 12/15/2014 0540   K 3.5 12/13/2014 0525   CL 101 12/15/2014 0540   CL 103 12/12/2014 0700   CO2 23 12/15/2014 0540   CO2 27 12/12/2014 0700   BUN 11 12/15/2014 0540   BUN 7 12/12/2014 0700   CREATININE 1.02* 12/15/2014 0540   CREATININE 1.03* 12/13/2014 0525   CREATININE 0.93 05/30/2013 0939   GLUCOSE 86 12/15/2014 0540   GLUCOSE 88 12/12/2014 0700   GLUCOSE 79 05/06/2013 1240   CALCIUM 8.6* 12/15/2014 0540   CALCIUM 8.6* 12/12/2014 0700   AST 23 05/30/2013 0939   AST 27 04/18/2013 0545   ALT 16 05/30/2013 0939   ALT 14 04/18/2013 0545   ALKPHOS 134* 05/30/2013 0939   ALKPHOS 141* 04/18/2013 0545   BILITOT 0.4 05/30/2013 0939   BILITOT 0.2* 04/18/2013 0545   PROT 8.2 05/30/2013 0939   PROT 7.8 04/18/2013 0545   ALBUMIN 3.8 05/30/2013 0939   ALBUMIN 3.2* 04/18/2013 0545      Studies/Results: No results found.  Anti-infectives: Anti-infectives    Start     Dose/Rate Route Frequency Ordered Stop   12/08/14 2000  ceFAZolin (ANCEF) IVPB 2 g/50 mL premix     2 g 100 mL/hr over 30 Minutes Intravenous 3 times per day 12/08/14 1332 12/09/14 0613   12/08/14 1030  ceFAZolin (ANCEF) 3 g in dextrose 5 % 50 mL IVPB     3 g 160 mL/hr over 30 Minutes Intravenous  Once 12/08/14 1025 12/08/14 1200   12/08/14 0600  ceFAZolin (ANCEF) 3 g in dextrose 5 % 50 mL IVPB  Status:  Discontinued     3 g 160 mL/hr over 30 Minutes Intravenous  Once 12/08/14 0552 12/08/14 0711   12/08/14 0600  ceFAZolin (ANCEF) 3 g in dextrose 5 % 50 mL IVPB     3 g 160 mL/hr over 30 Minutes Intravenous 60 min pre-op 12/07/14 1322 12/08/14 0730   12/08/14 0600  ceFAZolin (ANCEF) IVPB 2 g/50 mL premix  Status:  Discontinued     2 g 100 mL/hr over 30 Minutes Intravenous On call to O.R. 12/07/14 1321 12/07/14 1323      Assessment Principal Problem:   Fibroid, uterine with menorrhagia s/p TAH 12/08/2014 Active Problems:  Anemia-asx    Incarcerated hernia s/p laparoscopic repair with mesh 12/16/14    LOS: 7 days   Plan: Advance to regular diet.  Check stool for C. Diff.  Start Iron.   Rebecca Johnston 12/16/2014

## 2014-12-16 NOTE — Progress Notes (Signed)
8 Days Post-Op Procedure(s) (LRB): LAPAROSCOPIC INCARCERATED INCISIONAL VENTRAL WALL HERNIA REPAIR (N/A) HYSTERECTOMY ABDOMINAL (N/A) LAPAROSCOPIC LYSIS OF ADHESIONS (N/A) LAPAROSCOPIC SUPRA-UMBILICAL HERNIA REPAIR  (N/A)  Subjective: Patient reports incisional pain, tolerating PO and + BM.   Loose BM Objective: I have reviewed patient's vital signs, intake and output, medications and pathology.  General: alert, cooperative and no distress GI: soft NT lower abd incision dry and intact Extremities: extremities normal, atraumatic, no cyanosis or edema  Assessment: s/p Procedure(s): LAPAROSCOPIC INCARCERATED INCISIONAL VENTRAL WALL HERNIA REPAIR (N/A) HYSTERECTOMY ABDOMINAL (N/A) LAPAROSCOPIC LYSIS OF ADHESIONS (N/A) LAPARSCOPIC SUPRA-UMBILICAL HERNIA REPAIR  (N/A): ileus resolving but now has diarrhea Postop anemia which she is tolerating but is not improving Plan: Encourage ambulation would transfuse only if she has s/sx of anemia  LOS: 7 days    ARNOLD,JAMES 12/16/2014, 7:45 AM

## 2014-12-17 MED ORDER — FERROUS SULFATE 325 (65 FE) MG PO TABS: 325.0000 mg | ORAL_TABLET | Freq: Two times a day (BID) | ORAL | Status: DC

## 2014-12-17 NOTE — Progress Notes (Signed)
Assessment unchanged. Pt verbalized understanding of dc instructions through teach back including follow up care and when to call the doctor. Scripts x 2 given as provided by MD. Discharged via wc to front entrance to meet awaiting vehicle to carry home. Accompanied by NT.

## 2014-12-17 NOTE — Progress Notes (Signed)
9 Days Post-Op Procedure(s) (LRB): LAPAROSCOPIC INCARCERATED INCISIONAL VENTRAL WALL HERNIA REPAIR (N/A) HYSTERECTOMY ABDOMINAL (N/A) LAPAROSCOPIC LYSIS OF ADHESIONS (N/A) LAPARSCOPIC SUPRA-UMBILICAL HERNIA REPAIR  (N/A)  Subjective:tolerated diet still has loose stool Patient reports tolerating PO, + flatus and + BM.    Objective: I have reviewed patient's vital signs, intake and output and labs.  General: alert, cooperative and no distress GI: soft, non-tender; bowel sounds normal; no masses,  no organomegaly and incision: clean, dry and intact  Assessment: s/p Procedure(s): LAPAROSCOPIC INCARCERATED INCISIONAL VENTRAL WALL HERNIA REPAIR (N/A) HYSTERECTOMY ABDOMINAL (N/A) LAPAROSCOPIC LYSIS OF ADHESIONS (N/A) LAPARSCOPIC SUPRA-UMBILICAL HERNIA REPAIR  (N/A): progressing well  Plan: Encourage ambulation  If d/c before I see her again she knows to f/u in [place] [facility] in 4 weeks  LOS: 8 days    ARNOLD,JAMES 12/17/2014, 8:45 AM

## 2014-12-17 NOTE — Discharge Instructions (Signed)
HERNIA REPAIR: POST OP INSTRUCTIONS ° °1. DIET: Follow a light bland diet the first 24 hours after arrival home, such as soup, liquids, crackers, etc.  Be sure to include lots of fluids daily.  Avoid fast food or heavy meals as your are more likely to get nauseated.  Eat a low fat the next few days after surgery. °2. Take your usually prescribed home medications unless otherwise directed. °3. PAIN CONTROL: °a. Pain is best controlled by a usual combination of three different methods TOGETHER: °i. Ice/Heat °ii. Over the counter pain medication °iii. Prescription pain medication °b. Most patients will experience some swelling and bruising around the hernia(s) such as the bellybutton, groins, or old incisions.  Ice packs or heating pads (30-60 minutes up to 6 times a day) will help. Use ice for the first few days to help decrease swelling and bruising, then switch to heat to help relax tight/sore spots and speed recovery.  Some people prefer to use ice alone, heat alone, alternating between ice & heat.  Experiment to what works for you.  Swelling and bruising can take several weeks to resolve.   °c. It is helpful to take an over-the-counter pain medication regularly for the first few weeks.  Choose one of the following that works best for you: °i. Naproxen (Aleve, etc)  Two 220mg tabs twice a day °ii. Ibuprofen (Advil, etc) Three 200mg tabs four times a day (every meal & bedtime) °iii. Acetaminophen (Tylenol, etc) 325-650mg four times a day (every meal & bedtime) °d. A  prescription for pain medication should be given to you upon discharge.  Take your pain medication as prescribed.  °i. If you are having problems/concerns with the prescription medicine (does not control pain, nausea, vomiting, rash, itching, etc), please call us [phone] to see if we need to switch you to a different pain medicine that will work better for you and/or control your side effect better. °ii. If you need a refill on your pain  medication, please contact your pharmacy.  They will contact our office to request authorization. Prescriptions will not be filled after 5 pm or on week-ends. °4. Avoid getting constipated.  Between the surgery and the pain medications, it is common to experience some constipation.  Increasing fluid intake and taking a fiber supplement (such as Metamucil, Citrucel, FiberCon, MiraLax, etc) 1-2 times a day regularly will usually help prevent this problem from occurring.  A mild laxative (prune juice, Milk of Magnesia, MiraLax, etc) should be taken according to package directions if there are no bowel movements after 48 hours.   °5. Wash / shower every day.  You may shower over the dressings as they are waterproof.   °6. Remove your waterproof bandages 5 days after surgery.  You may leave the incision open to air.  You may replace a dressing/Band-Aid to cover the incision for comfort if you wish.  Continue to shower over incision(s) after the dressing is off. ° ° ° °7. ACTIVITIES as tolerated:   °a. You may resume regular (light) daily activities beginning the next day--such as daily self-care, walking, climbing stairs--gradually increasing activities as tolerated.  If you can walk 30 minutes without difficulty, it is safe to try more intense activity such as jogging, treadmill, bicycling, low-impact aerobics, swimming, etc. °b. Save the most intensive and strenuous activity for last such as sit-ups, heavy lifting, contact sports, etc  Refrain from any heavy lifting or straining until you are off narcotics for pain control.   °  c. DO NOT PUSH THROUGH PAIN.  Let pain be your guide: If it hurts to do something, don't do it.  Pain is your body warning you to avoid that activity for another week until the pain goes down. °d. You may drive when you are no longer taking prescription pain medication, you can comfortably wear a seatbelt, and you can safely maneuver your car and apply brakes. °e. You may have sexual intercourse  when it is comfortable.  °8. FOLLOW UP in our office °a. Please call [facility] at [phone] to set up an appointment to see your surgeon in the office for a follow-up appointment approximately 2-3 weeks after your surgery. °b. Make sure that you call for this appointment the day you arrive home to insure a convenient appointment time. °9.  IF YOU HAVE DISABILITY OR FAMILY LEAVE FORMS, BRING THEM TO THE OFFICE FOR PROCESSING.  DO NOT GIVE THEM TO YOUR DOCTOR. ° °WHEN TO CALL US [phone]: °1. Poor pain control °2. Reactions / problems with new medications (rash/itching, nausea, etc)  °3. Fever over 101.5 F (38.5 C) °4. Inability to urinate °5. Nausea and/or vomiting °6. Worsening swelling or bruising °7. Continued bleeding from incision. °8. Increased pain, redness, or drainage from the incision ° ° The clinic staff is available to answer your questions during regular business hours (8:30am-5pm).  Please don’t hesitate to call and ask to speak to one of our nurses for clinical concerns.  ° If you have a medical emergency, go to the nearest emergency room or call 911. ° A surgeon from [facility] is always on call at the hospitals in [place] ° °[facility] °[address], [address], [place], [place]  [address] ? ° [address] [address], [place], [place]   [address] °MAIN: [phone] ? TOLL FREE: [phone] ? FAX: [phone] °[email or web address] ° °Managing Pain ° °Pain after surgery or related to activity is often due to strain/injury to muscle, tendon, nerves and/or incisions.  This pain is usually short-term and will improve in a few months.  ° °Many people find it helpful to do the following things TOGETHER to help speed the process of healing and to get back to regular activity more quickly: ° °1. Avoid heavy physical activity at first °a. No lifting greater than 20 pounds at first, then increase to lifting as tolerated over the next few weeks °b. Do not “push  through” the pain.  Listen to your body and avoid positions and maneuvers than reproduce the pain.  Wait a few days before trying something more intense °c. Walking is okay as tolerated, but go slowly and stop when getting sore.  If you can walk 30 minutes without stopping or pain, you can try more intense activity (running, jogging, aerobics, cycling, swimming, treadmill, sex, sports, weightlifting, etc ) °d. Remember: If it hurts to do it, then don’t do it! ° °2. Take Anti-inflammatory medication °a. Choose ONE of the following over-the-counter medications: °i.            Acetaminophen 500mg tabs (Tylenol) 1-2 pills with every meal and just before bedtime (avoid if you have liver problems) °ii.            Naproxen 220mg tabs (ex. Aleve) 1-2 pills twice a day (avoid if you have kidney, stomach, IBD, or bleeding problems) °iii. Ibuprofen 200mg tabs (ex. Advil, Motrin) 3-4 pills with every meal and just before bedtime (avoid if you have kidney, stomach, IBD, or bleeding   problems) °b. Take with food/snack around the clock for 1-2 weeks °i. This helps the muscle and nerve tissues become less irritable and calm down faster ° °3. Use a Heating pad or Ice/Cold Pack °a. 4-6 times a day °b. May use warm bath/hottub  or showers ° °4. Try Gentle Massage and/or Stretching  °a. at the area of pain many times a day °b. stop if you feel pain - do not overdo it ° °Try these steps together to help you body heal faster and avoid making things get worse.  Doing just one of these things may not be enough.   ° °If you are not getting better after two weeks or are noticing you are getting worse, contact our office for further advice; we may need to re-evaluate you & see what other things we can do to help. ° °GETTING TO GOOD BOWEL HEALTH. °Irregular bowel habits such as constipation and diarrhea can lead to many problems over time.  Having one soft bowel movement a day is the most important way to prevent further problems.  The  anorectal canal is designed to handle stretching and feces to safely manage our ability to get rid of solid waste (feces, poop, stool) out of our body.  BUT, hard constipated stools can act like ripping concrete bricks and diarrhea can be a burning fire to this very sensitive area of our body, causing inflamed hemorrhoids, anal fissures, increasing risk is perirectal abscesses, abdominal pain/bloating, an making irritable bowel worse.     ° °The goal: ONE SOFT BOWEL MOVEMENT A DAY!  To have soft, regular bowel movements:  °• Drink plenty of fluids, consider 4-6 tall glasses of water a day.   °• Take plenty of fiber.  Fiber is the undigested part of plant food that passes into the colon, acting s “natures broom” to encourage bowel motility and movement.  Fiber can absorb and hold large amounts of water. This results in a larger, bulkier stool, which is soft and easier to pass. Work gradually over several weeks up to 6 servings a day of fiber (25g a day even more if needed) in the form of: °o Vegetables -- Root (potatoes, carrots, turnips), leafy green (lettuce, salad greens, celery, spinach), or cooked high residue (cabbage, broccoli, etc) °o Fruit -- Fresh (unpeeled skin & pulp), Dried (prunes, apricots, cherries, etc ),  or stewed ( applesauce)  °o Whole grain breads, pasta, etc (whole wheat)  °o Bran cereals  °• Bulking Agents -- This type of water-retaining fiber generally is easily obtained each day by one of the following:  °o Psyllium bran -- The psyllium plant is remarkable because its ground seeds can retain so much water. This product is available as Metamucil, Konsyl, Effersyllium, Per Diem Fiber, or the less expensive generic preparation in drug and health food stores. Although labeled a laxative, it really is not a laxative.  °o Methylcellulose -- This is another fiber derived from wood which also retains water. It is available as Citrucel. °o Polyethylene Glycol - and “artificial” fiber commonly called  Miralax or Glycolax.  It is helpful for people with gassy or bloated feelings with regular fiber °o Flax Seed - a less gassy fiber than psyllium °• No reading or other relaxing activity while on the toilet. If bowel movements take longer than 5 minutes, you are too constipated °• AVOID CONSTIPATION.  High fiber and water intake usually takes care of this.  Sometimes a laxative is needed to stimulate more frequent   bowel movements, but  °• Laxatives are not a good long-term solution as it can wear the colon out.  They can help jump-start bowels if constipated, but should be relied on constantly without discussing with your doctor °o Osmotics (Milk of Magnesia, Fleets phosphosoda, Magnesium citrate, MiraLax, GoLytely) are safer than  °o Stimulants (Senokot, Castor Oil, Dulcolax, Ex Lax)    °o Avoid taking laxatives for more than 7 days in a row. °•  IF SEVERELY CONSTIPATED, try a Bowel Retraining Program: °o Do not use laxatives.  °o Eat a diet high in roughage, such as bran cereals and leafy vegetables.  °o Drink six (6) ounces of prune or apricot juice each morning.  °o Eat two (2) large servings of stewed fruit each day.  °o Take one (1) heaping tablespoon of a psyllium-based bulking agent twice a day. Use sugar-free sweetener when possible to avoid excessive calories.  °o Eat a normal breakfast.  °o Set aside 15 minutes after breakfast to sit on the toilet, but do not strain to have a bowel movement.  °o If you do not have a bowel movement by the third day, use an enema and repeat the above steps.  °• Controlling diarrhea °o Switch to liquids and simpler foods for a few days to avoid stressing your intestines further. °o Avoid dairy products (especially milk & ice cream) for a short time.  The intestines often can lose the ability to digest lactose when stressed. °o Avoid foods that cause gassiness or bloating.  Typical foods include beans and other legumes, cabbage, broccoli, and dairy foods.  Every person has  some sensitivity to other foods, so listen to our body and avoid those foods that trigger problems for you. °o Adding fiber (Citrucel, Metamucil, psyllium, Miralax) gradually can help thicken stools by absorbing excess fluid and retrain the intestines to act more normally.  Slowly increase the dose over a few weeks.  Too much fiber too soon can backfire and cause cramping & bloating. °o Probiotics (such as active yogurt, Align, etc) may help repopulate the intestines and colon with normal bacteria and calm down a sensitive digestive tract.  Most studies show it to be of mild help, though, and such products can be costly. °o Medicines: °- Bismuth subsalicylate (ex. Kayopectate, Pepto Bismol) every 30 minutes for up to 6 doses can help control diarrhea.  Avoid if pregnant. °- Loperamide (Immodium) can slow down diarrhea.  Start with two tablets (4mg total) first and then try one tablet every 6 hours.  Avoid if you are having fevers or severe pain.  If you are not better or start feeling worse, stop all medicines and call your doctor for advice °o Call your doctor if you are getting worse or not better.  Sometimes further testing (cultures, endoscopy, X-ray studies, bloodwork, etc) may be needed to help diagnose and treat the cause of the diarrhea. ° °TROUBLESHOOTING IRREGULAR BOWELS °1) Avoid extremes of bowel movements (no bad constipation/diarrhea) °2) Miralax 17gm mixed in 8oz. water or juice-daily. May use BID as needed.  °3) Gas-x,Phazyme, etc. as needed for gas & bloating.  °4) Soft,bland diet. No spicy,greasy,fried foods.  °5) Prilosec over-the-counter as needed  °6) May hold gluten/wheat products from diet to see if symptoms improve.  °7)  May try probiotics (Align, Activa, etc) to help calm the bowels down °7) If symptoms become worse call back immediately. ° °Hernia, Adult °A hernia is the bulging of an organ or tissue through a weak spot   in the muscles of the abdomen (abdominal wall). Hernias develop most  often near the navel or groin. °There are many kinds of hernias. Common kinds include: °· Femoral hernia. This kind of hernia develops under the groin in the upper thigh area. °· Inguinal hernia. This kind of hernia develops in the groin or scrotum. °· Umbilical hernia. This kind of hernia develops near the navel. °· Hiatal hernia. This kind of hernia causes part of the stomach to be pushed up into the chest. °· Incisional hernia. This kind of hernia bulges through a scar from an abdominal surgery. °CAUSES °This condition may be caused by: °· Heavy lifting. °· Coughing over a long period of time. °· Straining to have a bowel movement. °· An incision made during an abdominal surgery. °· A birth defect (congenital defect). °· Excess weight or obesity. °· Smoking. °· Poor nutrition. °· Cystic fibrosis. °· Excess fluid in the abdomen. °· Undescended testicles. °SYMPTOMS °Symptoms of a hernia include: °· A lump on the abdomen. This is the first sign of a hernia. The lump may become more obvious with standing, straining, or coughing. It may get bigger over time if it is not treated or if the condition causing it is not treated. °· Pain. A hernia is usually painless, but it may become painful over time if treatment is delayed. The pain is usually dull and may get worse with standing or lifting heavy objects. °Sometimes a hernia gets tightly squeezed in the weak spot (strangulated) or stuck there (incarcerated) and causes additional symptoms. These symptoms may include: °· Vomiting. °· Nausea. °· Constipation. °· Irritability. °DIAGNOSIS °A hernia may be diagnosed with: °· A physical exam. During the exam your health care provider may ask you to cough or to make a specific movement, because a hernia is usually more visible when you move. °· Imaging tests. These can include: °¨ X-rays. °¨ Ultrasound. °¨ CT scan. °TREATMENT °A hernia that is small and painless may not need to be treated. A hernia that is large or painful may  be treated with surgery. Inguinal hernias may be treated with surgery to prevent incarceration or strangulation. Strangulated hernias are always treated with surgery, because lack of blood to the trapped organ or tissue can cause it to die. °Surgery to treat a hernia involves pushing the bulge back into place and repairing the weak part of the abdomen. °HOME CARE INSTRUCTIONS °· Avoid straining. °· Do not lift anything heavier than 10 lb (4.5 kg). °· Lift with your leg muscles, not your back muscles. This helps avoid strain. °· When coughing, try to cough gently. °· Prevent constipation. Constipation leads to straining with bowel movements, which can make a hernia worse or cause a hernia repair to break down. You can prevent constipation by: °¨ Eating a high-fiber diet that includes plenty of fruits and vegetables. °¨ Drinking enough fluids to keep your urine clear or pale yellow. Aim to drink 6-8 glasses of water per day. °¨ Using a stool softener as directed by your health care provider. °· Lose weight, if you are overweight. °· Do not use any tobacco products, including cigarettes, chewing tobacco, or electronic cigarettes. If you need help quitting, ask your health care provider. °· Keep all follow-up visits as directed by your health care provider. This is important. Your health care provider may need to monitor your condition. °SEEK MEDICAL CARE IF: °· You have swelling, redness, and pain in the affected area. °· Your bowel habits change. °SEEK IMMEDIATE   MEDICAL CARE IF: °· You have a fever. °· You have abdominal pain that is getting worse. °· You feel nauseous or you vomit. °· You cannot push the hernia back in place by gently pressing on it while you are lying down. °· The hernia: °¨ Changes in shape or size. °¨ Is stuck outside the abdomen. °¨ Becomes discolored. °¨ Feels hard or tender. °  °This information is not intended to replace advice given to you by your health care provider. Make sure you discuss  any questions you have with your health care provider. °  °Document Released: 02/17/2005 Document Revised: 03/10/2014 Document Reviewed: 12/28/2013 °Elsevier Interactive Patient Education ©2016 [employer] ° °

## 2014-12-17 NOTE — Progress Notes (Signed)
9 Days Post-Op  Subjective: Feeling better.  Tolerating diet.  Still having some loose stools.  Objective: Vital signs in last 24 hours: Temp:  [99.2 F (37.3 C)-100 F (37.8 C)] 99.2 F (37.3 C) (10/16 0549) Pulse Rate:  [88-108] 98 (10/16 0549) Resp:  [18] 18 (10/16 0549) BP: (114-131)/(62-69) 114/67 mmHg (10/16 0549) SpO2:  [96 %-100 %] 97 % (10/16 0549) Last BM Date: 12/16/14  Intake/Output from previous day: 10/15 0701 - 10/16 0700 In: 1200 [I.V.:1200] Out: 1800 [Urine:1000; Stool:800] Intake/Output this shift: Total I/O In: -  Out: 350 [Urine:350]  PE: General- In NAD Abdomen-soft, incisions clean and intact  Lab Results:   Recent Labs  12/15/14 0540 12/16/14 0530  WBC 10.8*  --   HGB 6.6* 6.5*  HCT 22.3*  --   PLT 450*  --    BMET  Recent Labs  12/15/14 0540  NA 136  K 3.6  CL 101  CO2 23  GLUCOSE 86  BUN 11  CREATININE 1.02*  CALCIUM 8.6*   PT/INR No results for input(s): LABPROT, INR in the last 72 hours. Comprehensive Metabolic Panel:    Component Value Date/Time   NA 136 12/15/2014 0540   NA 137 12/12/2014 0700   K 3.6 12/15/2014 0540   K 3.5 12/13/2014 0525   CL 101 12/15/2014 0540   CL 103 12/12/2014 0700   CO2 23 12/15/2014 0540   CO2 27 12/12/2014 0700   BUN 11 12/15/2014 0540   BUN 7 12/12/2014 0700   CREATININE 1.02* 12/15/2014 0540   CREATININE 1.03* 12/13/2014 0525   CREATININE 0.93 05/30/2013 0939   GLUCOSE 86 12/15/2014 0540   GLUCOSE 88 12/12/2014 0700   GLUCOSE 79 05/06/2013 1240   CALCIUM 8.6* 12/15/2014 0540   CALCIUM 8.6* 12/12/2014 0700   AST 23 05/30/2013 0939   AST 27 04/18/2013 0545   ALT 16 05/30/2013 0939   ALT 14 04/18/2013 0545   ALKPHOS 134* 05/30/2013 0939   ALKPHOS 141* 04/18/2013 0545   BILITOT 0.4 05/30/2013 0939   BILITOT 0.2* 04/18/2013 0545   PROT 8.2 05/30/2013 0939   PROT 7.8 04/18/2013 0545   ALBUMIN 3.8 05/30/2013 0939   ALBUMIN 3.2* 04/18/2013 0545     Studies/Results: No  results found.  Anti-infectives: Anti-infectives    Start     Dose/Rate Route Frequency Ordered Stop   12/08/14 2000  ceFAZolin (ANCEF) IVPB 2 g/50 mL premix     2 g 100 mL/hr over 30 Minutes Intravenous 3 times per day 12/08/14 1332 12/09/14 0613   12/08/14 1030  ceFAZolin (ANCEF) 3 g in dextrose 5 % 50 mL IVPB     3 g 160 mL/hr over 30 Minutes Intravenous  Once 12/08/14 1025 12/08/14 1200   12/08/14 0600  ceFAZolin (ANCEF) 3 g in dextrose 5 % 50 mL IVPB  Status:  Discontinued     3 g 160 mL/hr over 30 Minutes Intravenous  Once 12/08/14 0552 12/08/14 0711   12/08/14 0600  ceFAZolin (ANCEF) 3 g in dextrose 5 % 50 mL IVPB     3 g 160 mL/hr over 30 Minutes Intravenous 60 min pre-op 12/07/14 1322 12/08/14 0730   12/08/14 0600  ceFAZolin (ANCEF) IVPB 2 g/50 mL premix  Status:  Discontinued     2 g 100 mL/hr over 30 Minutes Intravenous On call to O.R. 12/07/14 1321 12/07/14 1323      Assessment Principal Problem:   Fibroid, uterine with menorrhagia s/p TAH 12/08/2014 Active Problems:  Anemia-asx    Incarcerated hernia s/p laparoscopic repair with mesh 12/16/14  Doing better overall.  C. Diff negative.  Still having some loose BMs but not as frequent.    LOS: 8 days   Plan:  Discharge today.  Instructions given to her.  Torrion Witter J 12/17/2014

## 2014-12-28 ENCOUNTER — Ambulatory Visit: Payer: Medicaid Other | Admitting: Obstetrics & Gynecology

## 2015-01-15 ENCOUNTER — Ambulatory Visit: Payer: Medicaid Other | Admitting: Obstetrics & Gynecology

## 2015-01-22 ENCOUNTER — Encounter: Payer: Self-pay | Admitting: Obstetrics & Gynecology

## 2015-01-22 ENCOUNTER — Ambulatory Visit (INDEPENDENT_AMBULATORY_CARE_PROVIDER_SITE_OTHER): Payer: Medicaid Other | Admitting: Obstetrics & Gynecology

## 2015-01-22 VITALS — BP 137/87 | HR 98 | Temp 98.0°F | Ht 66.0 in | Wt 272.0 lb

## 2015-01-22 DIAGNOSIS — Z9889 Other specified postprocedural states: Secondary | ICD-10-CM

## 2015-01-22 NOTE — Progress Notes (Signed)
Subjective:s/p TAH and L/S hernia repair     Rebecca Johnston is a 44 y.o. female who presents to the clinic 6 weeks status post total abdominal hysterectomy and hernia repair for abnormal uterine bleeding, adnexal mass and hernia. Eating a regular diet without difficulty. Bowel movements are normal. The patient is not having any pain.  The following portions of the patient's history were reviewed and updated as appropriate: allergies, current medications, past family history, past medical history, past social history, past surgical history and problem list.  Review of Systems slight vaginal discharge    Objective:    BP 137/87 mmHg  Pulse 98  Temp(Src) 98 F (36.7 C) (Oral)  Ht 5\' 6"  (1.676 m)  Wt 272 lb (123.378 kg)  BMI 43.92 kg/m2  LMP 11/26/2014 (Exact Date) General:  alert, cooperative and no distress  Abdomen: soft, bowel sounds active, non-tender  Incision:   healing well, no drainage, no erythema, no hernia, no seroma, no swelling, no dehiscence, incision well approximated    slight discharge no vaginal bleeding cuff intact Assessment:    Doing well postoperatively. Operative findings again reviewed. Pathology report discussed.   No pelvic mass at surgery. Adnexa intact Plan:    1. Continue any current medications. 2. Wound care discussed. 3. Activity restrictions: none 4. Anticipated return to work: now. 5.Mammogram repeat in 6 mo.  Woodroe Mode, MD 01/22/2015

## 2015-01-22 NOTE — Patient Instructions (Signed)
Mammogram A mammogram is an X-ray of the breasts that is done to check for abnormal changes. This procedure can screen for and detect any changes that may suggest breast cancer. A mammogram can also identify other changes and variations in the breast, such as:  Inflammation of the breast tissue (mastitis).  An infected area that contains a collection of pus (abscess).  A fluid-filled sac (cyst).  Fibrocystic changes. This is when breast tissue becomes denser, which can make the tissue feel rope-like or uneven under the skin.  Tumors that are not cancerous (benign). LET YOUR HEALTH CARE PROVIDER KNOW ABOUT:  Any allergies you have.  If you have breast implants.  If you have had previous breast disease, biopsy, or surgery.  If you are breastfeeding.  Any possibility that you could be pregnant, if this applies.  If you are younger than age 25.  If you have a family history of breast cancer. RISKS AND COMPLICATIONS Generally, this is a safe procedure. However, problems may occur, including:  Exposure to radiation. Radiation levels are very low with this test.  The results being misinterpreted.  The need for further tests.  The inability of the mammogram to detect certain cancers. BEFORE THE PROCEDURE  Schedule your test about 1-2 weeks after your menstrual period. This is usually when your breasts are the least tender.  If you have had a mammogram done at a different facility in the past, get the mammogram X-rays or have them sent to your current exam facility in order to compare them.  Wash your breasts and under your arms the day of the test.  Do not wear deodorants, perfumes, lotions, or powders anywhere on your body on the day of the test.  Remove any jewelry from your neck.  Wear clothes that you can change into and out of easily. PROCEDURE  You will undress from the waist up and put on a gown.  You will stand in front of the X-ray machine.  Each breast will  be placed between two plastic or glass plates. The plates will compress your breast for a few seconds. Try to stay as relaxed as possible during the procedure. This does not cause any harm to your breasts and any discomfort you feel will be very brief.  X-rays will be taken from different angles of each breast. The procedure may vary among health care providers and hospitals. AFTER THE PROCEDURE  The mammogram will be examined by a specialist (radiologist).  You may need to repeat certain parts of the test, depending on the quality of the images. This is commonly done if the radiologist needs a better view of the breast tissue.  Ask when your test results will be ready. Make sure you get your test results.  You may resume your normal activities.   This information is not intended to replace advice given to you by your health care provider. Make sure you discuss any questions you have with your health care provider.   Document Released: 02/15/2000 Document Revised: 11/08/2014 Document Reviewed: 04/28/2014 Elsevier Interactive Patient Education 2016 Elsevier Inc.  

## 2015-01-23 ENCOUNTER — Telehealth: Payer: Self-pay

## 2015-01-23 LAB — WET PREP, GENITAL
TRICH WET PREP: NONE SEEN
Yeast Wet Prep HPF POC: NONE SEEN

## 2015-01-23 MED ORDER — METRONIDAZOLE 500 MG PO TABS: 500.0000 mg | ORAL_TABLET | Freq: Two times a day (BID) | ORAL | Status: DC

## 2015-01-23 NOTE — Telephone Encounter (Signed)
Per Dr. Roselie Awkward pt has BV and needs Flagyl 500 mg po bid x 7 days.  Called pt and informed her of results and the need for tx.  Pt stated understanding.

## 2015-08-28 ENCOUNTER — Other Ambulatory Visit: Payer: Self-pay | Admitting: Family Medicine

## 2015-08-28 ENCOUNTER — Other Ambulatory Visit: Payer: Self-pay | Admitting: Internal Medicine

## 2015-08-28 DIAGNOSIS — Z1231 Encounter for screening mammogram for malignant neoplasm of breast: Secondary | ICD-10-CM

## 2015-08-30 ENCOUNTER — Ambulatory Visit
Admission: RE | Admit: 2015-08-30 | Discharge: 2015-08-30 | Disposition: A | Discharge status: Home / Self Care | Payer: Medicaid Other | Source: Ambulatory Visit | Attending: Family Medicine | Admitting: Family Medicine

## 2015-08-30 DIAGNOSIS — Z1231 Encounter for screening mammogram for malignant neoplasm of breast: Secondary | ICD-10-CM

## 2015-08-31 ENCOUNTER — Telehealth: Payer: Self-pay | Admitting: Family Medicine

## 2015-08-31 NOTE — Telephone Encounter (Signed)
-----   Message from Arnoldo Morale, MD sent at 08/31/2015  5:10 PM EDT ----- Mammogram is negative for malignancy.

## 2015-08-31 NOTE — Telephone Encounter (Signed)
Called pt. Pt verified name and date of birth. Pt notified that her mammogram was negative for malignancy. Pt voiced understanding.

## 2015-12-02 DIAGNOSIS — E119 Type 2 diabetes mellitus without complications: Secondary | ICD-10-CM

## 2015-12-02 HISTORY — DX: Type 2 diabetes mellitus without complications: E11.9

## 2015-12-21 ENCOUNTER — Encounter: Payer: Self-pay | Admitting: Family Medicine

## 2015-12-21 ENCOUNTER — Ambulatory Visit: Payer: Medicaid Other | Attending: Family Medicine | Admitting: Family Medicine

## 2015-12-21 ENCOUNTER — Other Ambulatory Visit (HOSPITAL_COMMUNITY)
Admission: RE | Admit: 2015-12-21 | Discharge: 2015-12-21 | Disposition: A | Discharge status: Home / Self Care | Payer: Medicaid Other | Source: Ambulatory Visit | Attending: Family Medicine | Admitting: Family Medicine

## 2015-12-21 VITALS — BP 109/77 | HR 81 | Temp 98.0°F | Ht 66.0 in | Wt 279.2 lb

## 2015-12-21 DIAGNOSIS — Z862 Personal history of diseases of the blood and blood-forming organs and certain disorders involving the immune mechanism: Secondary | ICD-10-CM | POA: Insufficient documentation

## 2015-12-21 DIAGNOSIS — Z9889 Other specified postprocedural states: Secondary | ICD-10-CM | POA: Insufficient documentation

## 2015-12-21 DIAGNOSIS — K219 Gastro-esophageal reflux disease without esophagitis: Secondary | ICD-10-CM

## 2015-12-21 DIAGNOSIS — N760 Acute vaginitis: Secondary | ICD-10-CM | POA: Insufficient documentation

## 2015-12-21 DIAGNOSIS — R05 Cough: Secondary | ICD-10-CM | POA: Diagnosis present

## 2015-12-21 DIAGNOSIS — E1129 Type 2 diabetes mellitus with other diabetic kidney complication: Secondary | ICD-10-CM

## 2015-12-21 DIAGNOSIS — F17210 Nicotine dependence, cigarettes, uncomplicated: Secondary | ICD-10-CM | POA: Diagnosis not present

## 2015-12-21 DIAGNOSIS — Z0000 Encounter for general adult medical examination without abnormal findings: Secondary | ICD-10-CM

## 2015-12-21 DIAGNOSIS — J302 Other seasonal allergic rhinitis: Secondary | ICD-10-CM

## 2015-12-21 DIAGNOSIS — E669 Obesity, unspecified: Secondary | ICD-10-CM | POA: Insufficient documentation

## 2015-12-21 DIAGNOSIS — Z23 Encounter for immunization: Secondary | ICD-10-CM | POA: Diagnosis not present

## 2015-12-21 DIAGNOSIS — Z6841 Body Mass Index (BMI) 40.0 and over, adult: Secondary | ICD-10-CM | POA: Diagnosis not present

## 2015-12-21 DIAGNOSIS — Z79899 Other long term (current) drug therapy: Secondary | ICD-10-CM | POA: Diagnosis not present

## 2015-12-21 DIAGNOSIS — N289 Disorder of kidney and ureter, unspecified: Secondary | ICD-10-CM

## 2015-12-21 DIAGNOSIS — Z90710 Acquired absence of both cervix and uterus: Secondary | ICD-10-CM | POA: Diagnosis not present

## 2015-12-21 DIAGNOSIS — Z113 Encounter for screening for infections with a predominantly sexual mode of transmission: Secondary | ICD-10-CM

## 2015-12-21 DIAGNOSIS — R7309 Other abnormal glucose: Secondary | ICD-10-CM

## 2015-12-21 HISTORY — DX: Type 2 diabetes mellitus with other diabetic kidney complication: E11.29

## 2015-12-21 HISTORY — DX: Other seasonal allergic rhinitis: J30.2

## 2015-12-21 LAB — LIPID PANEL
CHOL/HDL RATIO: 4.2 ratio (ref <=5.0)
Cholesterol: 196 mg/dL (ref 125–200)
HDL: 47 mg/dL (ref >=46)
LDL CALC: 115 mg/dL (ref <130)
TRIGLYCERIDES: 171 mg/dL — AB (ref <150)
VLDL: 34 mg/dL — AB (ref <30)

## 2015-12-21 LAB — COMPLETE METABOLIC PANEL WITHOUT GFR
ALT: 23 U/L (ref 6–29)
AST: 25 U/L (ref 10–35)
Albumin: 4.3 g/dL (ref 3.6–5.1)
Alkaline Phosphatase: 160 U/L — ABNORMAL HIGH (ref 33–115)
BUN: 22 mg/dL (ref 7–25)
CO2: 22 mmol/L (ref 20–31)
Calcium: 10 mg/dL (ref 8.6–10.2)
Chloride: 103 mmol/L (ref 98–110)
Creat: 1.65 mg/dL — ABNORMAL HIGH (ref 0.50–1.10)
GFR, Est African American: 43 mL/min — ABNORMAL LOW
GFR, Est Non African American: 37 mL/min — ABNORMAL LOW
Glucose, Bld: 173 mg/dL — ABNORMAL HIGH (ref 65–99)
Potassium: 4.6 mmol/L (ref 3.5–5.3)
Sodium: 137 mmol/L (ref 135–146)
Total Bilirubin: 0.3 mg/dL (ref 0.2–1.2)
Total Protein: 8.5 g/dL — ABNORMAL HIGH (ref 6.1–8.1)

## 2015-12-21 LAB — GLUCOSE, POCT (MANUAL RESULT ENTRY): POC Glucose: 190 mg/dL — AB (ref 70–99)

## 2015-12-21 LAB — POCT GLYCOSYLATED HEMOGLOBIN (HGB A1C): Hemoglobin A1C: 7.3

## 2015-12-21 MED ORDER — CETIRIZINE HCL 10 MG PO TABS: 10.0000 mg | ORAL_TABLET | Freq: Every day | ORAL | 11 refills | Status: DC

## 2015-12-21 MED ORDER — METFORMIN HCL ER 500 MG PO TB24: 500.0000 mg | ORAL_TABLET | Freq: Two times a day (BID) | ORAL | 2 refills | Status: DC

## 2015-12-21 MED ORDER — RANITIDINE HCL 300 MG PO TABS: 300.0000 mg | ORAL_TABLET | Freq: Every day | ORAL | 2 refills | Status: DC

## 2015-12-21 NOTE — Progress Notes (Signed)
SUBJECTIVE:  45 y.o. female for establish care and physical.  She has complaint of pain in her R ear. This is intermittent in outer ear. No lesions. No hearing loss. No fever.  Cough: intermittently productive of yellow sputum x 2 weeks. Improving. No fever. She quit smoking in June, 2017.  She had a normal screening mammogram in 08/30/15 She has hx of hysterectomy for fibroid uterus, negative pathology,  and had a pap done in 05/03/2013 that was normal with absent transformation zone. No pap smear needed.   She request STI screening. She is amenable to flu shot   Past Surgical History:  Procedure Laterality Date  . ABDOMINAL HYSTERECTOMY N/A 12/08/2014   Procedure: HYSTERECTOMY ABDOMINAL;  Surgeon: Woodroe Mode, MD;  Location: [facility];  Service: Gynecology;  Laterality: N/A;  . DILATION AND CURETTAGE OF UTERUS    . DILITATION & CURRETTAGE/HYSTROSCOPY WITH VERSAPOINT RESECTION N/A 01/06/2014   Procedure: Corena Pilgrim WITH VERSAPOINT;  Surgeon: Woodroe Mode, MD;  Location: [facility];  Service: Gynecology;  Laterality: N/A;  . LAPAROSCOPIC LYSIS OF ADHESIONS N/A 12/08/2014   Procedure: LAPAROSCOPIC LYSIS OF ADHESIONS;  Surgeon: Michael Boston, MD;  Location: [facility];  Service: General;  Laterality: N/A;  . OVARIAN CYST REMOVAL     cyst not removed. Cyst drained  . SUPRA-UMBILICAL HERNIA N/A [id number]   Procedure: LAPARSCOPIC SUPRA-UMBILICAL HERNIA REPAIR ;  Surgeon: Michael Boston, MD;  Location: [facility];  Service: General;  Laterality: N/A;  . VENTRAL HERNIA REPAIR N/A 12/08/2014   Procedure: LAPAROSCOPIC INCARCERATED INCISIONAL VENTRAL WALL HERNIA REPAIR;  Surgeon: Michael Boston, MD;  Location: [facility];  Service: General;  Laterality: N/A;     Social History  Substance Use Topics  . Smoking status: Current Some Day Smoker    Packs/day: 0.25    Years: 25.00    Types: Cigarettes  . Smokeless tobacco: Never Used  . Alcohol use 0.0 oz/week     Comment: socially   Current Outpatient Prescriptions   Medication Sig Dispense Refill  . acetaminophen (TYLENOL) 500 MG tablet Take 1,000 mg by mouth every 6 (six) hours as needed for moderate pain.    Marland Kitchen albuterol (PROVENTIL HFA;VENTOLIN HFA) 108 (90 BASE) MCG/ACT inhaler Inhale 2 puffs into the lungs every 6 (six) hours as needed for wheezing or shortness of breath. 1 Inhaler 3  . fluticasone (FLONASE) 50 MCG/ACT nasal spray Place 2 sprays into both nostrils daily. 16 g 2  . ferrous sulfate 325 (65 FE) MG tablet Take 1 tablet (325 mg total) by mouth 2 (two) times daily with a meal. (Patient not taking: Reported on 12/21/2015) 60 tablet 0  . metroNIDAZOLE (FLAGYL) 500 MG tablet Take 1 tablet (500 mg total) by mouth 2 (two) times daily. (Patient not taking: Reported on 12/21/2015) 14 tablet 0  . naproxen (NAPROSYN) 500 MG tablet Take 1 tablet (500 mg total) by mouth 2 (two) times daily with a meal. (Patient not taking: Reported on 12/21/2015) 40 tablet 1   No current facility-administered medications for this visit.    Allergies: Review of patient's allergies indicates no known allergies.  Patient's last menstrual period was 11/26/2014.  ROS:  Feeling well. No dyspnea or chest pain on exertion.  No abdominal pain, change in bowel habits, black or bloody stools.  No urinary tract symptoms. GYN ROS: no breast pain or new or enlarging lumps on self exam. No neurological complaints.  OBJECTIVE:  The patient appears well, alert, oriented x 3, in no distress. BP 109/77 (  BP Location: Right Arm, Patient Position: Sitting, Cuff Size: Large)   Pulse 81   Temp 98 F (36.7 C) (Oral)   Ht 5\' 6"  (1.676 m)   Wt 279 lb 3.2 oz (126.6 kg)   LMP 11/26/2014 Comment: negative preg test   SpO2 98%   BMI 45.06 kg/m  Obese. ENT normal.  Neck supple. No adenopathy or thyromegaly. PERLA. Lungs are clear, good air entry, no wheezes, rhonchi or rales. S1 and S2 normal, no murmurs, regular rate and rhythm. Abdomen soft without tenderness, guarding, mass or organomegaly.  Extremities show no edema, normal peripheral pulses. Neurological is normal, no focal findings.  BREAST EXAM: breasts appear normal, no suspicious masses, no skin or nipple changes or axillary nodes  PELVIC EXAM: normal external genitalia, vulva, vagina, cervix, uterus and adnexa, examination not indicated  ASSESSMENT:  well woman  PLAN:  counseled on breast self exam, STD prevention and HIV risk factors and prevention, low carb diet and exercise for obesity  return annually or prn

## 2015-12-21 NOTE — Assessment & Plan Note (Signed)
Hx of anemia in setting of heavy menstrual bleeding CBC done today

## 2015-12-21 NOTE — Patient Instructions (Addendum)
Rebecca Johnston was seen today for annual exam.  Diagnoses and all orders for this visit:  Routine screening for STI (sexually transmitted infection) -     HIV antibody (with reflex) -     RPR -     Urine cytology ancillary only  Hx of iron deficiency anemia -     CBC  Healthcare maintenance -     COMPLETE METABOLIC PANEL WITH GFR -     Lipid Panel -     HgB A1c -     Glucose (CBG)  Morbid obesity with BMI of 45.0-49.9, adult (HCC)  Gastroesophageal reflux disease, esophagitis presence not specified -     ranitidine (ZANTAC) 300 MG tablet; Take 1 tablet (300 mg total) by mouth at bedtime.  Seasonal allergic rhinitis, unspecified chronicity, unspecified trigger -     cetirizine (ZYRTEC) 10 MG tablet; Take 1 tablet (10 mg total) by mouth daily.  R ear exam is normal today  Work on low sugar, increased fat, fiber, and protein diet 3 meals a day No snacking in between meals  2 Liters of water daily at least  Regular exercise- 30 minutes most days of the week and vary exercise.   F/u in 3 months for weight loss  Dr. Adrian Blackwater

## 2015-12-21 NOTE — Assessment & Plan Note (Signed)
Allergy symptoms Plan Continue flonase Add zyrtec

## 2015-12-21 NOTE — Assessment & Plan Note (Signed)
Elevated A1c in diabetes range, this is the first time Patient is obese  Plan: Weight loss Low carb diet Starting metformin

## 2015-12-21 NOTE — Progress Notes (Signed)
Pt is here for a physical and has pain in her right ear.

## 2015-12-21 NOTE — Assessment & Plan Note (Signed)
A:  morbid obesity with elevated A1c (first reading) and blood sugar  P: Low carb diet discussed Regular exercise Starting metformin

## 2015-12-22 LAB — CBC
HCT: 42 % (ref 35.0–45.0)
Hemoglobin: 13.4 g/dL (ref 11.7–15.5)
MCH: 21.8 pg — AB (ref 27.0–33.0)
MCHC: 31.9 g/dL — AB (ref 32.0–36.0)
MCV: 68.3 fL — AB (ref 80.0–100.0)
MPV: 9.9 fL (ref 7.5–12.5)
PLATELETS: 372 10*3/uL (ref 140–400)
RBC: 6.15 MIL/uL — ABNORMAL HIGH (ref 3.80–5.10)
RDW: 14.9 % (ref 11.0–15.0)
WBC: 10.3 10*3/uL (ref 3.8–10.8)

## 2015-12-22 LAB — HIV ANTIBODY (ROUTINE TESTING W REFLEX): HIV 1&2 Ab, 4th Generation: NONREACTIVE

## 2015-12-22 LAB — RPR

## 2015-12-24 LAB — URINE CYTOLOGY ANCILLARY ONLY
Chlamydia: NEGATIVE
Neisseria Gonorrhea: NEGATIVE
TRICH (WINDOWPATH): NEGATIVE

## 2015-12-25 DIAGNOSIS — N289 Disorder of kidney and ureter, unspecified: Secondary | ICD-10-CM | POA: Insufficient documentation

## 2015-12-25 NOTE — Assessment & Plan Note (Signed)
Kidney  function is declined on metabolic panel: be sure to stay well hydrated drink 2 L of water a day, avoid NSAID (ibuprofen and aleve), and reduce blood sugar to protect and maintain your kidney function. Will recheck at follow up.

## 2015-12-26 ENCOUNTER — Telehealth: Payer: Self-pay

## 2015-12-26 DIAGNOSIS — R7309 Other abnormal glucose: Secondary | ICD-10-CM

## 2015-12-26 NOTE — Telephone Encounter (Signed)
Pt was called and informed of lab results, pt wants to know if she would be able to get a glucose machine to check her sugar levels at home.

## 2015-12-27 MED ORDER — ACCU-CHEK SOFTCLIX LANCETS MISC: 1.0000 | Freq: Two times a day (BID) | 12 refills | Status: DC

## 2015-12-27 MED ORDER — ACCU-CHEK AVIVA PLUS W/DEVICE KIT: 1.0000 | PACK | Freq: Two times a day (BID) | 0 refills | Status: DC

## 2015-12-27 MED ORDER — GLUCOSE BLOOD VI STRP: 1.0000 | ORAL_STRIP | Freq: Two times a day (BID) | 12 refills | Status: DC

## 2015-12-27 NOTE — Addendum Note (Signed)
Addended by: Boykin Nearing on: 12/27/2015 12:35 PM   Modules accepted: Orders

## 2015-12-27 NOTE — Telephone Encounter (Signed)
Testing supplies sent to her [facility]

## 2016-04-04 ENCOUNTER — Ambulatory Visit: Payer: Medicaid Other | Attending: Family Medicine | Admitting: Family Medicine

## 2016-04-04 ENCOUNTER — Encounter: Payer: Self-pay | Admitting: Family Medicine

## 2016-04-04 VITALS — BP 138/85 | HR 108 | Temp 98.2°F | Ht 66.0 in | Wt 284.0 lb

## 2016-04-04 DIAGNOSIS — Z794 Long term (current) use of insulin: Secondary | ICD-10-CM | POA: Diagnosis not present

## 2016-04-04 DIAGNOSIS — M79641 Pain in right hand: Secondary | ICD-10-CM | POA: Diagnosis not present

## 2016-04-04 DIAGNOSIS — E1165 Type 2 diabetes mellitus with hyperglycemia: Secondary | ICD-10-CM | POA: Insufficient documentation

## 2016-04-04 DIAGNOSIS — R0981 Nasal congestion: Secondary | ICD-10-CM

## 2016-04-04 DIAGNOSIS — Z87891 Personal history of nicotine dependence: Secondary | ICD-10-CM | POA: Insufficient documentation

## 2016-04-04 DIAGNOSIS — G629 Polyneuropathy, unspecified: Secondary | ICD-10-CM

## 2016-04-04 DIAGNOSIS — Z6841 Body Mass Index (BMI) 40.0 and over, adult: Secondary | ICD-10-CM | POA: Diagnosis not present

## 2016-04-04 DIAGNOSIS — R7309 Other abnormal glucose: Secondary | ICD-10-CM | POA: Diagnosis not present

## 2016-04-04 DIAGNOSIS — E119 Type 2 diabetes mellitus without complications: Secondary | ICD-10-CM

## 2016-04-04 HISTORY — DX: Polyneuropathy, unspecified: G62.9

## 2016-04-04 LAB — POCT GLYCOSYLATED HEMOGLOBIN (HGB A1C): HEMOGLOBIN A1C: 6.8

## 2016-04-04 MED ORDER — FLUTICASONE PROPIONATE 50 MCG/ACT NA SUSP: 2.0000 | Freq: Every day | NASAL | 2 refills | Status: DC

## 2016-04-04 MED ORDER — GABAPENTIN 100 MG PO CAPS: ORAL_CAPSULE | ORAL | 1 refills | Status: DC

## 2016-04-04 NOTE — Patient Instructions (Addendum)
Rebecca Johnston was seen today for hyperglycemia and obesity.  Diagnoses and all orders for this visit:  Type 2 diabetes mellitus without complication, with long-term current use of insulin (HCC) -     POCT glycosylated hemoglobin (Hb A1C)  Nasal congestion -     fluticasone (FLONASE) 50 MCG/ACT nasal spray; Place 2 sprays into both nostrils daily.  Neuropathy (HCC) -     gabapentin (NEURONTIN) 100 MG capsule; Take 100 mg by mouth 1-3 times daily as needed for nerve pains -     Vitamin B12   Since you are on metformin. Will also check vitamin B12 as low vit B12 from metformin use can also cause nerve pains  Weight loss goals: 1. Work out 2-3 times per week  -Remember to keep a calendar or log of workout days -Have bag up work out video or routine from the Internet in case you cannot make it to the gym  2. Eat high protein breakfast Eggs, lean meats, fish, cheese, avocado   -a high protein/healthy fat  breakfast along with plenty of water will help you stay fuller longer and be able to avoid cravings.    F/u in 6 weeks for weight check  Dr .Adrian Blackwater    Mediterranean Diet A Mediterranean diet refers to food and lifestyle choices that are based on the traditions of countries located on the [place]. This way of eating has been shown to help prevent certain conditions and improve outcomes for people who have chronic diseases, like kidney disease and heart disease. What are tips for following this plan? Lifestyle  Cook and eat meals together with your family, when possible.  Drink enough fluid to keep your urine clear or pale yellow.  Be physically active every day. This includes:  Aerobic exercise like running or swimming.  Leisure activities like gardening, walking, or housework.  Get 7-8 hours of sleep each night.  If recommended by your health care provider, drink red wine in moderation. This means 1 glass a day for nonpregnant women and 2 glasses a day for men. A  glass of wine equals 5 oz (150 mL). Reading food labels  Check the serving size of packaged foods. For foods such as rice and pasta, the serving size refers to the amount of cooked product, not dry.  Check the total fat in packaged foods. Avoid foods that have saturated fat or trans fats.  Check the ingredients list for added sugars, such as corn syrup. Shopping  At the grocery store, buy most of your food from the areas near the walls of the store. This includes:  Fresh fruits and vegetables (produce).  Grains, beans, nuts, and seeds. Some of these may be available in unpackaged forms or large amounts (in bulk).  Fresh seafood.  Poultry and eggs.  Low-fat dairy products.  Buy whole ingredients instead of prepackaged foods.  Buy fresh fruits and vegetables in-season from local farmers markets.  Buy frozen fruits and vegetables in resealable bags.  If you do not have access to quality fresh seafood, buy precooked frozen shrimp or canned fish, such as tuna, salmon, or sardines.  Buy small amounts of raw or cooked vegetables, salads, or olives from the deli or salad bar at your store.  Stock your pantry so you always have certain foods on hand, such as olive oil, canned tuna, canned tomatoes, rice, pasta, and beans. Cooking  Cook foods with extra-virgin olive oil instead of using butter or other vegetable oils.  Have meat as  a side dish, and have vegetables or grains as your main dish. This means having meat in small portions or adding small amounts of meat to foods like pasta or stew.  Use beans or vegetables instead of meat in common dishes like chili or lasagna.  Experiment with different cooking methods. Try roasting or broiling vegetables instead of steaming or sauteing them.  Add frozen vegetables to soups, stews, pasta, or rice.  Add nuts or seeds for added healthy fat at each meal. You can add these to yogurt, salads, or vegetable dishes.  Marinate fish or  vegetables using olive oil, lemon juice, garlic, and fresh herbs. Meal planning  Plan to eat 1 vegetarian meal one day each week. Try to work up to 2 vegetarian meals, if possible.  Eat seafood 2 or more times a week.  Have healthy snacks readily available, such as:  Vegetable sticks with hummus.  Greek yogurt.  Fruit and nut trail mix.  Eat balanced meals throughout the week. This includes:  Fruit: 2-3 servings a day  Vegetables: 4-5 servings a day  Low-fat dairy: 2 servings a day  Fish, poultry, or lean meat: 1 serving a day  Beans and legumes: 2 or more servings a week  Nuts and seeds: 1-2 servings a day  Whole grains: 6-8 servings a day  Extra-virgin olive oil: 3-4 servings a day  Limit red meat and sweets to only a few servings a month What are my food choices?  Mediterranean diet  Recommended  Grains: Whole-grain pasta. Brown rice. Bulgar wheat. Polenta. Couscous. Whole-wheat bread. [place].  Vegetables: Artichokes. Beets. Broccoli. Cabbage. Carrots. Eggplant. Green beans. Chard. Kale. Spinach. Onions. Leeks. Peas. Squash. Tomatoes. Peppers. Radishes.  Fruits: Apples. Apricots. Avocado. Berries. Bananas. Cherries. Dates. Figs. Grapes. Lemons. Melon. Oranges. Peaches. Plums. Pomegranate.  Meats and other protein foods: Beans. Almonds. Sunflower seeds. Pine nuts. Peanuts. [place]. Salmon. Scallops. Shrimp. [place]. Tilapia. Clams. Oysters. Eggs.  Dairy: Low-fat milk. Cheese. Greek yogurt.  Beverages: Water. Red wine. Herbal tea.  Fats and oils: Extra virgin olive oil. Avocado oil. Grape seed oil.  Sweets and desserts: [place] yogurt with honey. Baked apples. Poached pears. Trail mix.  Seasoning and other foods: Basil. Cilantro. Coriander. Cumin. Mint. Parsley. Sage. Rosemary. Tarragon. Garlic. Oregano. Thyme. Pepper. Balsalmic vinegar. Tahini. Hummus. Tomato sauce. Olives. Mushrooms.  Limit these  Grains: Prepackaged pasta or rice dishes. Prepackaged  cereal with added sugar.  Vegetables: Deep fried potatoes (french fries).  Fruits: Fruit canned in syrup.  Meats and other protein foods: Beef. Pork. Lamb. Poultry with skin. Hot dogs. Berniece Salines.  Dairy: Ice cream. Sour cream. Whole milk.  Beverages: Juice. Sugar-sweetened soft drinks. Beer. Liquor and spirits.  Fats and oils: Butter. Canola oil. Vegetable oil. Beef fat (tallow). Lard.  Sweets and desserts: Cookies. Cakes. Pies. Candy.  Seasoning and other foods: Mayonnaise. Premade sauces and marinades.  The items listed may not be a complete list. Talk with your dietitian about what dietary choices are right for you. Summary  The Mediterranean diet includes both food and lifestyle choices.  Eat a variety of fresh fruits and vegetables, beans, nuts, seeds, and whole grains.  Limit the amount of red meat and sweets that you eat.  Talk with your health care provider about whether it is safe for you to drink red wine in moderation. This means 1 glass a day for nonpregnant women and 2 glasses a day for men. A glass of wine equals 5 oz (150 mL). This information is not  intended to replace advice given to you by your health care provider. Make sure you discuss any questions you have with your health care provider. Document Released: 10/11/2015 Document Revised: 11/13/2015 Document Reviewed: 10/11/2015 Elsevier Interactive Patient Education  2017 [place] DASH stands for "Dietary Approaches to Stop Hypertension." The DASH eating plan is a healthy eating plan that has been shown to reduce high blood pressure (hypertension). Additional health benefits may include reducing the risk of type 2 diabetes mellitus, heart disease, and stroke. The DASH eating plan may also help with weight loss. What do I need to know about the DASH eating plan? For the DASH eating plan, you will follow these general guidelines:  Choose foods with less than 150 milligrams of sodium per  serving (as listed on the food label).  Use salt-free seasonings or herbs instead of table salt or sea salt.  Check with your health care provider or pharmacist before using salt substitutes.  Eat lower-sodium products. These are often labeled as "low-sodium" or "no salt added."  Eat fresh foods. Avoid eating a lot of canned foods.  Eat more vegetables, fruits, and low-fat dairy products.  Choose whole grains. Look for the word "whole" as the first word in the ingredient list.  Choose fish and skinless chicken or [place] more often than red meat. Limit fish, poultry, and meat to 6 oz (170 g) each day.  Limit sweets, desserts, sugars, and sugary drinks.  Choose heart-healthy fats.  Eat more home-cooked food and less restaurant, buffet, and fast food.  Limit fried foods.  Do not fry foods. Cook foods using methods such as baking, boiling, grilling, and broiling instead.  When eating at a restaurant, ask that your food be prepared with less salt, or no salt if possible. What foods can I eat? Seek help from a dietitian for individual calorie needs. Grains  Whole grain or whole wheat bread. Brown rice. Whole grain or whole wheat pasta. Quinoa, bulgur, and whole grain cereals. Low-sodium cereals. Corn or whole wheat flour tortillas. Whole grain cornbread. Whole grain crackers. Low-sodium crackers. Vegetables  Fresh or frozen vegetables (raw, steamed, roasted, or grilled). Low-sodium or reduced-sodium tomato and vegetable juices. Low-sodium or reduced-sodium tomato sauce and paste. Low-sodium or reduced-sodium canned vegetables. Fruits  All fresh, canned (in natural juice), or frozen fruits. Meat and Other Protein Products  Ground beef (85% or leaner), grass-fed beef, or beef trimmed of fat. Skinless chicken or [place]. Ground chicken or [place]. Pork trimmed of fat. All fish and seafood. Eggs. Dried beans, peas, or lentils. Unsalted nuts and seeds. Unsalted canned beans. Dairy  Low-fat  dairy products, such as skim or 1% milk, 2% or reduced-fat cheeses, low-fat ricotta or cottage cheese, or plain low-fat yogurt. Low-sodium or reduced-sodium cheeses. Fats and Oils  Tub margarines without trans fats. Light or reduced-fat mayonnaise and salad dressings (reduced sodium). Avocado. Safflower, olive, or canola oils. Natural peanut or almond butter. Other  Unsalted popcorn and pretzels. The items listed above may not be a complete list of recommended foods or beverages. Contact your dietitian for more options.  What foods are not recommended? Grains  White bread. White pasta. White rice. Refined cornbread. Bagels and croissants. Crackers that contain trans fat. Vegetables  Creamed or fried vegetables. Vegetables in a cheese sauce. Regular canned vegetables. Regular canned tomato sauce and paste. Regular tomato and vegetable juices. Fruits  Canned fruit in light or heavy syrup. Fruit juice. Meat and Other Protein Products  Fatty cuts of meat. Ribs, chicken wings, bacon, sausage, bologna, salami, chitterlings, fatback, hot dogs, bratwurst, and packaged luncheon meats. Salted nuts and seeds. Canned beans with salt. Dairy  Whole or 2% milk, cream, half-and-half, and cream cheese. Whole-fat or sweetened yogurt. Full-fat cheeses or blue cheese. Nondairy creamers and whipped toppings. Processed cheese, cheese spreads, or cheese curds. Condiments  Onion and garlic salt, seasoned salt, table salt, and sea salt. Canned and packaged gravies. Worcestershire sauce. Tartar sauce. Barbecue sauce. Teriyaki sauce. Soy sauce, including reduced sodium. Steak sauce. Fish sauce. Oyster sauce. Cocktail sauce. Horseradish. Ketchup and mustard. Meat flavorings and tenderizers. Bouillon cubes. Hot sauce. Tabasco sauce. Marinades. Taco seasonings. Relishes. Fats and Oils  Butter, stick margarine, lard, shortening, ghee, and bacon fat. Coconut, palm kernel, or palm oils. Regular salad dressings. Other  Pickles  and olives. Salted popcorn and pretzels. The items listed above may not be a complete list of foods and beverages to avoid. Contact your dietitian for more information.  Where can I find more information? National Heart, Lung, and Blood Institute: travelstabloid.com This information is not intended to replace advice given to you by your health care provider. Make sure you discuss any questions you have with your health care provider. Document Released: 02/06/2011 Document Revised: 07/26/2015 Document Reviewed: 12/22/2012 Elsevier Interactive Patient Education  2017 [employer].

## 2016-04-04 NOTE — Progress Notes (Signed)
Subjective:  Patient ID: Rebecca Johnston, female    DOB: [birth year]  Age: 46 y.o. MRN: [id number]  CC: Hyperglycemia and Obesity   HPI CHASELYN NANNEY has morbid obesity and diabetes and presents for   1.  Obesity: she continues to gain weight. She has decreased sugar intake. She is frustrated by her inability to lose weight.  She is amenable to goal setting for weight loss. She is not exercising or eating well. She would like to start exercising. Goals:  Goal 2-3 times per week  Eat high protein breakfast   2. Sharp pains: in R hand and left lateral hip down to foot. Tingling in left foot. Symptoms started in past two months. No rash on skin changes. She is taking metformin for new onset diabetes.    Social History  Substance Use Topics  . Smoking status: Former Smoker    Packs/day: 0.25    Years: 25.00    Types: Cigarettes    Quit date: 08/02/2015  . Smokeless tobacco: Never Used  . Alcohol use 0.0 oz/week     Comment: socially   Outpatient Medications Prior to Visit  Medication Sig Dispense Refill  . ACCU-CHEK SOFTCLIX LANCETS lancets 1 each by Other route 2 (two) times daily. 100 each 12  . acetaminophen (TYLENOL) 500 MG tablet Take 1,000 mg by mouth every 6 (six) hours as needed for moderate pain.    Marland Kitchen albuterol (PROVENTIL HFA;VENTOLIN HFA) 108 (90 BASE) MCG/ACT inhaler Inhale 2 puffs into the lungs every 6 (six) hours as needed for wheezing or shortness of breath. 1 Inhaler 3  . Blood Glucose Monitoring Suppl (ACCU-CHEK AVIVA PLUS) w/Device KIT 1 Device by Does not apply route 2 (two) times daily. 1 kit 0  . cetirizine (ZYRTEC) 10 MG tablet Take 1 tablet (10 mg total) by mouth daily. 30 tablet 11  . fluticasone (FLONASE) 50 MCG/ACT nasal spray Place 2 sprays into both nostrils daily. 16 g 2  . glucose blood (ACCU-CHEK AVIVA PLUS) test strip 1 each by Other route 2 (two) times daily. 100 each 12  . metFORMIN (GLUCOPHAGE XR) 500 MG 24 hr tablet Take 1 tablet (500 mg total) by  mouth 2 (two) times daily after a meal. 60 tablet 2  . ranitidine (ZANTAC) 300 MG tablet Take 1 tablet (300 mg total) by mouth at bedtime. 30 tablet 2   No facility-administered medications prior to visit.     ROS Review of Systems  Constitutional: Negative for chills and fever.  Eyes: Negative for visual disturbance.  Respiratory: Negative for shortness of breath.   Cardiovascular: Negative for chest pain.  Gastrointestinal: Negative for abdominal pain and blood in stool.  Musculoskeletal: Negative for arthralgias and back pain.  Skin: Negative for rash.  Allergic/Immunologic: Negative for immunocompromised state.  Neurological: Positive for numbness.  Hematological: Negative for adenopathy. Does not bruise/bleed easily.  Psychiatric/Behavioral: Negative for dysphoric mood and suicidal ideas.    Objective:  BP 138/85 (BP Location: Left Arm, Patient Position: Sitting, Cuff Size: Large)   Pulse (!) 108   Temp 98.2 F (36.8 C) (Oral)   Ht '5\' 6"'  (1.676 m)   Wt 284 lb (128.8 kg)   LMP 11/26/2014 (Exact Date)   SpO2 97%   BMI 45.84 kg/m   BP/Weight 04/04/2016 12/21/2015 53/66/4403  Systolic BP 474 259 563  Diastolic BP 85 77 87  Wt. (Lbs) 284 279.2 272  BMI 45.84 45.06 43.92   Wt Readings from Last 3 Encounters:  04/04/16  284 lb (128.8 kg)  12/21/15 279 lb 3.2 oz (126.6 kg)  01/22/15 272 lb (123.4 kg)    Physical Exam  Constitutional: She is oriented to person, place, and time. She appears well-developed and well-nourished. No distress.  HENT:  Head: Normocephalic and atraumatic.  Cardiovascular: Normal rate, regular rhythm, normal heart sounds and intact distal pulses.   Pulmonary/Chest: Effort normal and breath sounds normal.  Musculoskeletal: She exhibits no edema.  Neurological: She is alert and oriented to person, place, and time.  Skin: Skin is warm and dry. No rash noted.  Psychiatric: She has a normal mood and affect.    Lab Results  Component Value Date    HGBA1C 7.3 12/21/2015    Assessment & Plan:  Vina was seen today for hyperglycemia and obesity.  Diagnoses and all orders for this visit:  Type 2 diabetes mellitus without complication, with long-term current use of insulin (HCC) -     POCT glycosylated hemoglobin (Hb A1C)  Nasal congestion -     Discontinue: fluticasone (FLONASE) 50 MCG/ACT nasal spray; Place 2 sprays into both nostrils daily. -     fluticasone (FLONASE) 50 MCG/ACT nasal spray; Place 2 sprays into both nostrils daily.  Neuropathy (HCC) -     gabapentin (NEURONTIN) 100 MG capsule; Take 100 mg by mouth 1-3 times daily as needed for nerve pains -     Vitamin B12   There are no diagnoses linked to this encounter.  No orders of the defined types were placed in this encounter.   Follow-up: Return in about 6 weeks (around 05/16/2016) for diabetes .   Boykin Nearing MD

## 2016-04-05 LAB — VITAMIN B12: VITAMIN B 12: 683 pg/mL (ref 200–1100)

## 2016-04-06 NOTE — Assessment & Plan Note (Signed)
Neuropathic symptoms in setting of diabetes Plan: Prn gabapentin Checked vit B12 since patient taking metformin, normal

## 2016-04-06 NOTE — Assessment & Plan Note (Signed)
Improved with metformin Plan to continue metformin

## 2016-04-06 NOTE — Assessment & Plan Note (Signed)
Morbid obesity in patient with diabetes Weight loss needed Plan: Weight loss goals: 1. Work out 2-3 times per week  -Remember to keep a calendar or log of workout days -Have bag up work out video or routine from the Internet in case you cannot make it to the gym  2. Eat high protein breakfast Eggs, lean meats, fish, cheese, avocado   -a high protein/healthy fat  breakfast along with plenty of water will help you stay fuller longer and be able to avoid cravings.

## 2016-04-09 ENCOUNTER — Telehealth: Payer: Self-pay

## 2016-04-09 NOTE — Telephone Encounter (Signed)
Pt was called and informed of lab results. 

## 2016-06-10 ENCOUNTER — Ambulatory Visit (HOSPITAL_COMMUNITY)
Admission: EM | Admit: 2016-06-10 | Discharge: 2016-06-10 | Disposition: A | Discharge status: Home / Self Care | Payer: Medicaid Other | Attending: Internal Medicine | Admitting: Internal Medicine

## 2016-06-10 ENCOUNTER — Encounter (HOSPITAL_COMMUNITY): Payer: Self-pay | Admitting: *Deleted

## 2016-06-10 DIAGNOSIS — B349 Viral infection, unspecified: Secondary | ICD-10-CM | POA: Diagnosis not present

## 2016-06-10 DIAGNOSIS — R51 Headache: Secondary | ICD-10-CM

## 2016-06-10 DIAGNOSIS — M542 Cervicalgia: Secondary | ICD-10-CM

## 2016-06-10 DIAGNOSIS — R0981 Nasal congestion: Secondary | ICD-10-CM

## 2016-06-10 DIAGNOSIS — R519 Headache, unspecified: Secondary | ICD-10-CM

## 2016-06-10 MED ORDER — KETOROLAC TROMETHAMINE 10 MG PO TABS: 10.0000 mg | ORAL_TABLET | Freq: Four times a day (QID) | ORAL | 0 refills | Status: DC | PRN

## 2016-06-10 MED ORDER — FLUTICASONE PROPIONATE 50 MCG/ACT NA SUSP: 2.0000 | Freq: Every day | NASAL | 2 refills | Status: DC

## 2016-06-10 MED ORDER — PREDNISONE 50 MG PO TABS: 50.0000 mg | ORAL_TABLET | Freq: Every day | ORAL | 0 refills | Status: DC

## 2016-06-10 MED ORDER — KETOROLAC TROMETHAMINE 60 MG/2ML IM SOLN
60.0000 mg | Freq: Once | INTRAMUSCULAR | Status: AC
  Administered 2016-06-10: 60 mg via INTRAMUSCULAR

## 2016-06-10 MED ORDER — KETOROLAC TROMETHAMINE 60 MG/2ML IM SOLN
INTRAMUSCULAR | Status: AC
  Filled 2016-06-10: qty 2

## 2016-06-10 MED ORDER — FLUTICASONE PROPIONATE 50 MCG/ACT NA SUSP: 2.0000 | Freq: Every day | NASAL | 0 refills | Status: DC

## 2016-06-10 NOTE — ED Triage Notes (Signed)
Pt  Reports  Pain  r    Side  Of  Face  X  4  Days        Pt   Denies   Any  Injury   She  Is   Sitting  Upright on the  exam table  Speaking in   Complete    sentances   And  Is  In no  Acute  Distress    Denies   Any  Numbness  /  Tingling  Describes  It  As   Sharp  Pain       Down temples

## 2016-06-10 NOTE — Discharge Instructions (Addendum)
Bad headache/neck pain could be related to overuse from getting ready to move, or possible viral syndrome.  Did not see ear infection on exam but a lot of sinus congestion is present and fluid behind right ear drum.  Injection of ketorolac (toradol, anti inflammatory/pain reliever) was given at the urgent care today.  Prescriptions for prednisone (to try to break headache), nasal steroid spray (for congestion), ketorolac, were sent to the pharmacy.  Rest.  Recheck or followup with primary care provider for new fever >100.5, rash around right eye, or if not starting to improve in a few days.

## 2016-06-10 NOTE — ED Provider Notes (Signed)
[facility]    CSN: [id number] Arrival date & time: 06/10/16  1414     History   Chief Complaint Chief Complaint  Patient presents with  . Facial Pain    HPI Rebecca Johnston is a 46 y.o. female. She has been moving. For the last 4 days, she has had some discomfort in the back right neck and the back of her head. No weakness or clumsiness. No vision change. No nausea, not vomiting. Has seasonal allergies and is quite congested, some sniffles. Not coughing, no fever. Able to walk into the urgent care independently.    HPI  Past Medical History:  Diagnosis Date  . Anemia   . Asthma   . Fibroids   . GERD (gastroesophageal reflux disease)   . History of blood transfusion Feb 2015  . Obesity, Class III, BMI 40-49.9 (morbid obesity) ([place])   . Shortness of breath dyspnea     Patient Active Problem List   Diagnosis Date Noted  . Neuropathy ([place]) 04/04/2016  . Renal impairment 12/25/2015  . Morbid obesity with BMI of 45.0-49.9, adult ([place]) 12/21/2015  . Hx of iron deficiency anemia 12/21/2015  . Seasonal allergic rhinitis 12/21/2015  . Type 2 diabetes mellitus without complications ([place]) 83/38/2505  . Incarcerated hernia s/p lap repair w mesh 12/08/2014 12/08/2014  . Esophageal reflux 10/21/2013  . Nasal congestion 10/21/2013  . Fibroid, uterine with menorrhagia s/p TAH 12/08/2014 10/03/2013  . Chest pain, unspecified 09/02/2013  . Abnormal CT scan, chest 09/02/2013    Past Surgical History:  Procedure Laterality Date  . ABDOMINAL HYSTERECTOMY N/A 12/08/2014   Procedure: HYSTERECTOMY ABDOMINAL;  Surgeon: Woodroe Mode, MD;  Location: [facility];  Service: Gynecology;  Laterality: N/A;  . DILATION AND CURETTAGE OF UTERUS    . DILITATION & CURRETTAGE/HYSTROSCOPY WITH VERSAPOINT RESECTION N/A 01/06/2014   Procedure: Corena Pilgrim WITH VERSAPOINT;  Surgeon: Woodroe Mode, MD;  Location: [facility];  Service: Gynecology;  Laterality: N/A;  . LAPAROSCOPIC LYSIS OF ADHESIONS N/A  12/08/2014   Procedure: LAPAROSCOPIC LYSIS OF ADHESIONS;  Surgeon: Michael Boston, MD;  Location: [facility];  Service: General;  Laterality: N/A;  . OVARIAN CYST REMOVAL     cyst not removed. Cyst drained  . SUPRA-UMBILICAL HERNIA N/A 39/08/6732   Procedure: LAPARSCOPIC SUPRA-UMBILICAL HERNIA REPAIR ;  Surgeon: Michael Boston, MD;  Location: [facility];  Service: General;  Laterality: N/A;  . VENTRAL HERNIA REPAIR N/A 12/08/2014   Procedure: LAPAROSCOPIC INCARCERATED INCISIONAL VENTRAL WALL HERNIA REPAIR;  Surgeon: Michael Boston, MD;  Location: [facility];  Service: General;  Laterality: N/A;    OB History    Gravida Para Term Preterm AB Living   1 0 0 0 1 0   SAB TAB Ectopic Multiple Live Births   0 0 1 0         Home Medications    Prior to Admission medications   Medication Sig Start Date End Date Taking? Authorizing Provider  ACCU-CHEK SOFTCLIX LANCETS lancets 1 each by Other route 2 (two) times daily. 12/27/15   Josalyn Funches, MD  acetaminophen (TYLENOL) 500 MG tablet Take 1,000 mg by mouth every 6 (six) hours as needed for moderate pain.    Historical Provider, MD  albuterol (PROVENTIL HFA;VENTOLIN HFA) 108 (90 BASE) MCG/ACT inhaler Inhale 2 puffs into the lungs every 6 (six) hours as needed for wheezing or shortness of breath. 05/30/13   Reyne Dumas, MD  Blood Glucose Monitoring Suppl (ACCU-CHEK AVIVA PLUS) w/Device KIT 1 Device  by Does not apply route 2 (two) times daily. 12/27/15   Josalyn Funches, MD  cetirizine (ZYRTEC) 10 MG tablet Take 1 tablet (10 mg total) by mouth daily. 12/21/15   Josalyn Funches, MD  fluticasone (FLONASE) 50 MCG/ACT nasal spray Place 2 sprays into both nostrils daily. 06/10/16   Sherlene Shams, MD  fluticasone (FLONASE) 50 MCG/ACT nasal spray Place 2 sprays into both nostrils daily. 06/10/16   Sherlene Shams, MD  gabapentin (NEURONTIN) 100 MG capsule Take 100 mg by mouth 1-3 times daily as needed for nerve pains 04/04/16   Boykin Nearing, MD  glucose blood (ACCU-CHEK  AVIVA PLUS) test strip 1 each by Other route 2 (two) times daily. 12/27/15   Josalyn Funches, MD  ketorolac (TORADOL) 10 MG tablet Take 1 tablet (10 mg total) by mouth every 6 (six) hours as needed. 06/10/16   Sherlene Shams, MD  metFORMIN (GLUCOPHAGE XR) 500 MG 24 hr tablet Take 1 tablet (500 mg total) by mouth 2 (two) times daily after a meal. 12/21/15   Josalyn Funches, MD  predniSONE (DELTASONE) 50 MG tablet Take 1 tablet (50 mg total) by mouth daily. 06/10/16   Sherlene Shams, MD  ranitidine (ZANTAC) 300 MG tablet Take 1 tablet (300 mg total) by mouth at bedtime. 12/21/15   Boykin Nearing, MD    Family History Family History  Problem Relation Age of Onset  . Hypertension Mother   . Diabetes Father   . Breast cancer Maternal Aunt   . Diabetes Maternal Aunt   . Diabetes Paternal Aunt   . Diabetes Maternal Aunt   . Diabetes Maternal Aunt     Social History Social History  Substance Use Topics  . Smoking status: Former Smoker    Packs/day: 0.25    Years: 25.00    Types: Cigarettes    Quit date: 08/02/2015  . Smokeless tobacco: Never Used  . Alcohol use 0.0 oz/week     Comment: socially     Allergies   Patient has no known allergies.   Review of Systems Review of Systems  All other systems reviewed and are negative.    Physical Exam Triage Vital Signs ED Triage Vitals  Enc Vitals Group     BP 06/10/16 1447 120/77     Pulse Rate 06/10/16 1447 (!) 103     Resp 06/10/16 1447 16     Temp 06/10/16 1447 98.7 F (37.1 C)     Temp Source 06/10/16 1447 Oral     SpO2 06/10/16 1447 97 %     Weight --      Height --      Pain Score 06/10/16 1449 9     Pain Loc --    Updated Vital Signs BP 120/77 (BP Location: Right Arm)   Pulse (!) 103 Comment: notified rn  Temp 98.7 F (37.1 C) (Oral)   Resp 16   LMP 11/26/2014 (Exact Date)   SpO2 97%   Physical Exam  Constitutional: She is oriented to person, place, and time. No distress.  HENT:  Head: Atraumatic.  Right TM  is opaque without erythema, left is flushed deep pink, mildly dull Marked nasal congestion, bogginess, mucousy material present bilaterally Throat is slightly injected  Eyes:  Conjugate gaze observed, no eye redness/discharge  Neck: Neck supple.  Cardiovascular: Normal rate and regular rhythm.   Pulmonary/Chest: No respiratory distress. She has no wheezes. She has no rales.  Lungs clear, symmetric breath sounds   Abdominal: She exhibits  no distension.  Musculoskeletal: Normal range of motion.  Neurological: She is alert and oriented to person, place, and time.  Face is symmetric, speech is clear/coherent Able to walk independently, and climb on/off the exam table  Skin: Skin is warm and dry.  No rash, no facial hypesthesia  Nursing note and vitals reviewed.    UC Treatments / Results   Procedures Procedures (including critical care time)  Medications Ordered in UC Medications  ketorolac (TORADOL) injection 60 mg (60 mg Intramuscular Given 06/10/16 1556)     Final Clinical Impressions(s) / UC Diagnoses   Final diagnoses:  Viral syndrome  Bad headache  Neck pain on right side  Sinus congestion   Bad headache/neck pain could be related to overuse from getting ready to move, or possible viral syndrome.  Did not see ear infection on exam but a lot of sinus congestion is present and fluid behind right ear drum.  Injection of ketorolac (toradol, anti inflammatory/pain reliever) was given at the urgent care today.  Prescriptions for prednisone (to try to break headache), nasal steroid spray (for congestion), ketorolac, were sent to the pharmacy.  Rest.  Recheck or followup with primary care provider for new fever >100.5, rash around right eye, or if not starting to improve in a few days.    New Prescriptions Discharge Medication List as of 06/10/2016  4:04 PM    START taking these medications   Details  ketorolac (TORADOL) 10 MG tablet Take 1 tablet (10 mg total) by mouth every 6  (six) hours as needed., Starting Tue 06/10/2016, Normal    predniSONE (DELTASONE) 50 MG tablet Take 1 tablet (50 mg total) by mouth daily., Starting Tue 06/10/2016, Normal         Sherlene Shams, MD 06/12/16 2047

## 2016-06-23 ENCOUNTER — Encounter (HOSPITAL_COMMUNITY): Payer: Self-pay

## 2016-07-16 ENCOUNTER — Encounter: Payer: Self-pay | Admitting: Family Medicine

## 2016-10-22 ENCOUNTER — Encounter (HOSPITAL_COMMUNITY): Payer: Self-pay | Admitting: Emergency Medicine

## 2016-10-22 ENCOUNTER — Encounter (HOSPITAL_COMMUNITY): Payer: Self-pay

## 2016-10-22 ENCOUNTER — Ambulatory Visit (HOSPITAL_COMMUNITY)
Admission: EM | Admit: 2016-10-22 | Discharge: 2016-10-22 | Disposition: A | Discharge status: Nursing Home (Includes ICF) | Payer: Medicaid Other | Attending: Family Medicine | Admitting: Family Medicine

## 2016-10-22 ENCOUNTER — Emergency Department (HOSPITAL_COMMUNITY)
Admission: EM | Admit: 2016-10-22 | Discharge: 2016-10-22 | Disposition: A | Discharge status: Home / Self Care | Payer: Medicaid Other | Attending: Emergency Medicine | Admitting: Emergency Medicine

## 2016-10-22 DIAGNOSIS — E860 Dehydration: Secondary | ICD-10-CM | POA: Insufficient documentation

## 2016-10-22 DIAGNOSIS — Z7984 Long term (current) use of oral hypoglycemic drugs: Secondary | ICD-10-CM | POA: Diagnosis not present

## 2016-10-22 DIAGNOSIS — Z87891 Personal history of nicotine dependence: Secondary | ICD-10-CM | POA: Diagnosis not present

## 2016-10-22 DIAGNOSIS — E119 Type 2 diabetes mellitus without complications: Secondary | ICD-10-CM | POA: Diagnosis not present

## 2016-10-22 DIAGNOSIS — R072 Precordial pain: Secondary | ICD-10-CM

## 2016-10-22 DIAGNOSIS — R739 Hyperglycemia, unspecified: Secondary | ICD-10-CM

## 2016-10-22 DIAGNOSIS — R531 Weakness: Secondary | ICD-10-CM

## 2016-10-22 DIAGNOSIS — R824 Acetonuria: Secondary | ICD-10-CM

## 2016-10-22 DIAGNOSIS — N179 Acute kidney failure, unspecified: Secondary | ICD-10-CM | POA: Insufficient documentation

## 2016-10-22 DIAGNOSIS — M6281 Muscle weakness (generalized): Secondary | ICD-10-CM

## 2016-10-22 DIAGNOSIS — Z79899 Other long term (current) drug therapy: Secondary | ICD-10-CM | POA: Diagnosis not present

## 2016-10-22 DIAGNOSIS — J45909 Unspecified asthma, uncomplicated: Secondary | ICD-10-CM | POA: Diagnosis not present

## 2016-10-22 HISTORY — DX: Type 2 diabetes mellitus without complications: E11.9

## 2016-10-22 LAB — COMPREHENSIVE METABOLIC PANEL
ALT: 28 U/L (ref 14–54)
AST: 37 U/L (ref 15–41)
Albumin: 3.9 g/dL (ref 3.5–5.0)
Alkaline Phosphatase: 178 U/L — ABNORMAL HIGH (ref 38–126)
Anion gap: 12 (ref 5–15)
BUN: 20 mg/dL (ref 6–20)
CALCIUM: 10.1 mg/dL (ref 8.9–10.3)
CO2: 19 mmol/L — ABNORMAL LOW (ref 22–32)
Chloride: 97 mmol/L — ABNORMAL LOW (ref 101–111)
Creatinine, Ser: 1.61 mg/dL — ABNORMAL HIGH (ref 0.44–1.00)
GFR, EST AFRICAN AMERICAN: 43 mL/min — AB (ref >60)
GFR, EST NON AFRICAN AMERICAN: 37 mL/min — AB (ref >60)
Glucose, Bld: 403 mg/dL — ABNORMAL HIGH (ref 65–99)
POTASSIUM: 4.7 mmol/L (ref 3.5–5.1)
Sodium: 128 mmol/L — ABNORMAL LOW (ref 135–145)
TOTAL PROTEIN: 8.6 g/dL — AB (ref 6.5–8.1)
Total Bilirubin: 0.7 mg/dL (ref 0.3–1.2)

## 2016-10-22 LAB — POCT I-STAT, CHEM 8
BUN: 26 mg/dL — AB (ref 6–20)
Calcium, Ion: 1.31 mmol/L (ref 1.15–1.40)
Chloride: 98 mmol/L — ABNORMAL LOW (ref 101–111)
Creatinine, Ser: 1.7 mg/dL — ABNORMAL HIGH (ref 0.44–1.00)
Glucose, Bld: 512 mg/dL (ref 65–99)
HEMATOCRIT: 48 % — AB (ref 36.0–46.0)
Hemoglobin: 16.3 g/dL — ABNORMAL HIGH (ref 12.0–15.0)
POTASSIUM: 4.5 mmol/L (ref 3.5–5.1)
Sodium: 131 mmol/L — ABNORMAL LOW (ref 135–145)
TCO2: 23 mmol/L (ref 0–100)

## 2016-10-22 LAB — URINALYSIS, ROUTINE W REFLEX MICROSCOPIC
BILIRUBIN URINE: NEGATIVE
KETONES UR: 5 mg/dL — AB
LEUKOCYTES UA: NEGATIVE
Nitrite: NEGATIVE
PH: 5 (ref 5.0–8.0)
Protein, ur: NEGATIVE mg/dL
Specific Gravity, Urine: 1.017 (ref 1.005–1.030)

## 2016-10-22 LAB — POCT URINALYSIS DIP (DEVICE)
BILIRUBIN URINE: NEGATIVE
GLUCOSE, UA: 500 mg/dL — AB
LEUKOCYTES UA: NEGATIVE
Nitrite: NEGATIVE
Protein, ur: 100 mg/dL — AB
SPECIFIC GRAVITY, URINE: 1.02 (ref 1.005–1.030)
UROBILINOGEN UA: 1 mg/dL (ref 0.0–1.0)
pH: 6 (ref 5.0–8.0)

## 2016-10-22 LAB — I-STAT VENOUS BLOOD GAS, ED
Bicarbonate: 26.2 mmol/L (ref 20.0–28.0)
O2 SAT: 59 %
TCO2: 28 mmol/L (ref 0–100)
pCO2, Ven: 47.2 mmHg (ref 44.0–60.0)
pH, Ven: 7.353 (ref 7.250–7.430)
pO2, Ven: 33 mmHg (ref 32.0–45.0)

## 2016-10-22 LAB — CBC
HCT: 43 % (ref 36.0–46.0)
Hemoglobin: 13.8 g/dL (ref 12.0–15.0)
MCH: 21.5 pg — ABNORMAL LOW (ref 26.0–34.0)
MCHC: 32.1 g/dL (ref 30.0–36.0)
MCV: 66.9 fL — ABNORMAL LOW (ref 78.0–100.0)
Platelets: 246 10*3/uL (ref 150–400)
RBC: 6.43 MIL/uL — AB (ref 3.87–5.11)
RDW: 14.2 % (ref 11.5–15.5)
WBC: 8.3 10*3/uL (ref 4.0–10.5)

## 2016-10-22 LAB — CBG MONITORING, ED
Glucose-Capillary: 498 mg/dL — ABNORMAL HIGH (ref 65–99)
Glucose-Capillary: 363 mg/dL — ABNORMAL HIGH (ref 65–99)
Glucose-Capillary: 324 mg/dL — ABNORMAL HIGH (ref 65–99)

## 2016-10-22 MED ORDER — METFORMIN HCL ER 500 MG PO TB24
500.0000 mg | ORAL_TABLET | Freq: Once | ORAL | Status: DC
  Filled 2016-10-22: qty 1

## 2016-10-22 MED ORDER — SODIUM CHLORIDE 0.9 % IV BOLUS (SEPSIS)
1000.0000 mL | Freq: Once | INTRAVENOUS | Status: AC
  Administered 2016-10-22: 1000 mL via INTRAVENOUS

## 2016-10-22 MED ORDER — METFORMIN HCL ER 500 MG PO TB24: 500.0000 mg | ORAL_TABLET | Freq: Two times a day (BID) | ORAL | 0 refills | Status: DC

## 2016-10-22 NOTE — ED Provider Notes (Signed)
[facility] DEPT Provider Note   CSN: [id number] Arrival date & time: 10/22/16  1221     History   Chief Complaint Chief Complaint  Patient presents with  . Hyperglycemia    HPI Rebecca Johnston is a 46 y.o. female.  HPI   Rebecca Johnston is a 46 y.o. female, with a history of DM and obesity, presenting to the ED with hyperglycemia. States she thinks it has been high for about a week. Also endorses nausea, polydipsia, and polyuria. She does not measure her blood sugar regularly. Seen at the UC today and was sent here due to concern for DKA.  Last saw her PCP in Feb 2018, but states she wasn't given a follow up appt and her PCP has since left the practice ([place]). She did call the practice today and was told they would call her with options for follow-up appointment. Adds she is new to her DM diagnosis with diagnosis made in Oct 2017 and states she needs more education. She has not been taking her metformin because she has been trying to manage her diabetes with apple cider vinegar. She has only been intermittently taking her morning dose of metformin for the last few months.  Endorses a feeling of weakness and tingling in the thumb and index finger of the right hand over the last few days. Waxes and wanes.   Denies CP, SOB, LOC, vomiting, diarrhea, fever/chills, abdominal pain, or any other complaints.     Past Medical History:  Diagnosis Date  . Anemia   . Asthma   . Diabetes mellitus without complication ([place]) 87/5643  . Fibroids   . GERD (gastroesophageal reflux disease)   . History of blood transfusion Feb 2015  . Obesity, Class III, BMI 40-49.9 (morbid obesity) ([place])   . Shortness of breath dyspnea     Patient Active Problem List   Diagnosis Date Noted  . Neuropathy 04/04/2016  . Renal impairment 12/25/2015  . Morbid obesity with BMI of 45.0-49.9, adult ([place]) 12/21/2015  . Hx of iron deficiency anemia 12/21/2015  . Seasonal allergic rhinitis  12/21/2015  . Type 2 diabetes mellitus without complications ([place]) 32/95/1884  . Incarcerated hernia s/p lap repair w mesh 12/08/2014 12/08/2014  . Esophageal reflux 10/21/2013  . Nasal congestion 10/21/2013  . Fibroid, uterine with menorrhagia s/p TAH 12/08/2014 10/03/2013  . Chest pain, unspecified 09/02/2013  . Abnormal CT scan, chest 09/02/2013    Past Surgical History:  Procedure Laterality Date  . ABDOMINAL HYSTERECTOMY N/A 12/08/2014   Procedure: HYSTERECTOMY ABDOMINAL;  Surgeon: Woodroe Mode, MD;  Location: [facility];  Service: Gynecology;  Laterality: N/A;  . DILATION AND CURETTAGE OF UTERUS    . DILITATION & CURRETTAGE/HYSTROSCOPY WITH VERSAPOINT RESECTION N/A 01/06/2014   Procedure: Corena Pilgrim WITH VERSAPOINT;  Surgeon: Woodroe Mode, MD;  Location: [facility];  Service: Gynecology;  Laterality: N/A;  . LAPAROSCOPIC LYSIS OF ADHESIONS N/A 12/08/2014   Procedure: LAPAROSCOPIC LYSIS OF ADHESIONS;  Surgeon: Michael Boston, MD;  Location: [facility];  Service: General;  Laterality: N/A;  . OVARIAN CYST REMOVAL     cyst not removed. Cyst drained  . SUPRA-UMBILICAL HERNIA N/A 16/08/628   Procedure: LAPARSCOPIC SUPRA-UMBILICAL HERNIA REPAIR ;  Surgeon: Michael Boston, MD;  Location: [facility];  Service: General;  Laterality: N/A;  . VENTRAL HERNIA REPAIR N/A 12/08/2014   Procedure: LAPAROSCOPIC INCARCERATED INCISIONAL VENTRAL WALL HERNIA REPAIR;  Surgeon: Michael Boston, MD;  Location: [facility];  Service: General;  Laterality: N/A;  OB History    Gravida Para Term Preterm AB Living   1 0 0 0 1 0   SAB TAB Ectopic Multiple Live Births   0 0 1 0         Home Medications    Prior to Admission medications   Medication Sig Start Date End Date Taking? Authorizing Provider  ACCU-CHEK SOFTCLIX LANCETS lancets 1 each by Other route 2 (two) times daily. 12/27/15  Yes Funches, Josalyn, MD  acetaminophen (TYLENOL) 500 MG tablet Take 1,000 mg by mouth every 6 (six) hours as needed for moderate pain.   Yes  [provider]  albuterol (PROVENTIL HFA;VENTOLIN HFA) 108 (90 BASE) MCG/ACT inhaler Inhale 2 puffs into the lungs every 6 (six) hours as needed for wheezing or shortness of breath. 05/30/13  Yes Reyne Dumas, MD  Blood Glucose Monitoring Suppl (ACCU-CHEK AVIVA PLUS) w/Device KIT 1 Device by Does not apply route 2 (two) times daily. 12/27/15  Yes Funches, Josalyn, MD  cetirizine (ZYRTEC) 10 MG tablet Take 1 tablet (10 mg total) by mouth daily. 12/21/15  Yes Funches, Josalyn, MD  fluticasone (FLONASE) 50 MCG/ACT nasal spray Place 2 sprays into both nostrils daily. 06/10/16  Yes Sherlene Shams, MD  gabapentin (NEURONTIN) 100 MG capsule Take 100 mg by mouth 1-3 times daily as needed for nerve pains 04/04/16  Yes Funches, Josalyn, MD  glucose blood (ACCU-CHEK AVIVA PLUS) test strip 1 each by Other route 2 (two) times daily. 12/27/15  Yes Funches, Adriana Mccallum, MD  metFORMIN (GLUCOPHAGE XR) 500 MG 24 hr tablet Take 1 tablet (500 mg total) by mouth 2 (two) times daily after a meal. 12/21/15  Yes Funches, Josalyn, MD  ranitidine (ZANTAC) 300 MG tablet Take 1 tablet (300 mg total) by mouth at bedtime. 12/21/15  Yes Funches, Josalyn, MD  fluticasone (FLONASE) 50 MCG/ACT nasal spray Place 2 sprays into both nostrils daily. 06/10/16   Sherlene Shams, MD  ketorolac (TORADOL) 10 MG tablet Take 1 tablet (10 mg total) by mouth every 6 (six) hours as needed. 06/10/16   Sherlene Shams, MD  metFORMIN (GLUCOPHAGE-XR) 500 MG 24 hr tablet Take 1 tablet (500 mg total) by mouth 2 (two) times daily with a meal. 10/22/16 11/21/16  Joy, Shawn C, PA-C  predniSONE (DELTASONE) 50 MG tablet Take 1 tablet (50 mg total) by mouth daily. 06/10/16   Sherlene Shams, MD    Family History Family History  Problem Relation Age of Onset  . Hypertension Mother   . Diabetes Father   . Breast cancer Maternal Aunt   . Diabetes Maternal Aunt   . Diabetes Paternal Aunt   . Diabetes Maternal Aunt   . Diabetes Maternal Aunt     Social  History Social History  Substance Use Topics  . Smoking status: Former Smoker    Packs/day: 0.25    Years: 25.00    Types: Cigarettes    Quit date: 08/02/2015  . Smokeless tobacco: Never Used  . Alcohol use 0.0 oz/week     Comment: socially     Allergies   Patient has no known allergies.   Review of Systems Review of Systems  Constitutional: Negative for chills, diaphoresis and fever.  Respiratory: Negative for shortness of breath.   Cardiovascular: Negative for chest pain.  Gastrointestinal: Positive for nausea. Negative for abdominal pain, diarrhea and vomiting.  Endocrine: Positive for polydipsia and polyuria.  Genitourinary: Negative for dysuria and hematuria.  Neurological: Negative for dizziness, light-headedness, numbness and headaches.  All other systems reviewed and are negative.    Physical Exam Updated Vital Signs BP (!) 147/103 (BP Location: Left Arm)   Pulse 95   Temp 97.9 F (36.6 C) (Oral)   Resp 20   Ht 5' 4" (1.626 m)   Wt 125.2 kg (276 lb)   LMP 11/26/2014 (Exact Date)   SpO2 97%   BMI 47.38 kg/m   Physical Exam  Constitutional: She appears well-developed and well-nourished. No distress.  HENT:  Head: Normocephalic and atraumatic.  Eyes: Pupils are equal, round, and reactive to light. Conjunctivae and EOM are normal.  Neck: Neck supple.  Cardiovascular: Normal rate, regular rhythm, normal heart sounds and intact distal pulses.   Pulmonary/Chest: Effort normal and breath sounds normal. No respiratory distress.  Abdominal: Soft. There is no tenderness. There is no guarding.  Musculoskeletal: She exhibits no edema.  Normal motor function intact in all extremities and spine. No midline spinal tenderness.   Lymphadenopathy:    She has no cervical adenopathy.  Neurological: She is alert.  No sensory deficits. Strength 5/5 in all extremities. Specifically, strength in each of the cardinal directions of the right shoulder, elbow, and wrist was found  to be 5/5. Full and bilaterally equal strength against resistance with flexion and extension at the MCP, PIP, and DIP joints of the fingers. Abduction and abduction against resistance fully intact and equal bilaterally.  No gait disturbance. Coordination intact including heel to shin and finger to nose. Cranial nerves III-XII grossly intact. No facial droop.   Skin: Skin is warm and dry. Capillary refill takes less than 2 seconds. She is not diaphoretic.  Psychiatric: She has a normal mood and affect. Her behavior is normal.  Nursing note and vitals reviewed.    ED Treatments / Results  Labs (all labs ordered are listed, but only abnormal results are displayed) Labs Reviewed  CBC - Abnormal; Notable for the following:       Result Value   RBC 6.43 (*)    MCV 66.9 (*)    MCH 21.5 (*)    All other components within normal limits  URINALYSIS, ROUTINE W REFLEX MICROSCOPIC - Abnormal; Notable for the following:    Glucose, UA >=500 (*)    Hgb urine dipstick SMALL (*)    Ketones, ur 5 (*)    Bacteria, UA RARE (*)    Squamous Epithelial / LPF 0-5 (*)    All other components within normal limits  COMPREHENSIVE METABOLIC PANEL - Abnormal; Notable for the following:    Sodium 128 (*)    Chloride 97 (*)    CO2 19 (*)    Glucose, Bld 403 (*)    Creatinine, Ser 1.61 (*)    Total Protein 8.6 (*)    Alkaline Phosphatase 178 (*)    GFR calc non Af Amer 37 (*)    GFR calc Af Amer 43 (*)    All other components within normal limits  CBG MONITORING, ED - Abnormal; Notable for the following:    Glucose-Capillary 498 (*)    All other components within normal limits  CBG MONITORING, ED - Abnormal; Notable for the following:    Glucose-Capillary 363 (*)    All other components within normal limits  CBG MONITORING, ED - Abnormal; Notable for the following:    Glucose-Capillary 324 (*)    All other components within normal limits  I-STAT VENOUS BLOOD GAS, ED    EKG  EKG  Interpretation None  Radiology No results found.  Procedures Angiocath insertion Date/Time: 10/22/2016 4:45 PM Performed by: Lorayne Bender Authorized by: Arlean Hopping C  Consent: Verbal consent obtained. Risks and benefits: risks, benefits and alternatives were discussed Consent given by: patient Patient understanding: patient states understanding of the procedure being performed Patient consent: the patient's understanding of the procedure matches consent given Procedure consent: procedure consent matches procedure scheduled Patient identity confirmed: verbally with patient Local anesthesia used: no  Anesthesia: Local anesthesia used: no  Sedation: Patient sedated: no Patient tolerance: Patient tolerated the procedure well with no immediate complications Comments: [id number] Angiocath placed in the left A/C without ultrasound guidance. Positive flash. Easily flushed without pain or signs of infiltration. Blood for lab tests drawn at the same time.    (including critical care time)  Medications Ordered in ED Medications  sodium chloride 0.9 % bolus 1,000 mL (0 mLs Intravenous Stopped 10/22/16 1856)    Followed by  sodium chloride 0.9 % bolus 1,000 mL (0 mLs Intravenous Stopped 10/22/16 1856)     Initial Impression / Assessment and Plan / ED Course  I have reviewed the triage vital signs and the nursing notes.  Pertinent labs & imaging results that were available during my care of the patient were reviewed by me and considered in my medical decision making (see chart for details).     Patient presents with hyperglycemia without evidence of DKA. Patient is nontoxic appearing, afebrile, not tachycardic, not tachypneic, not hypotensive, maintains SPO2 of 98-100% on room air, and is in no apparent distress. Patient has no signs of sepsis or other serious or life-threatening condition. Patient's blood sugar responded well to IV fluids. Patient's other symptoms quickly resolved  early into the fluid infusion. Patient advised to take her medications as prescribed. PCP follow-up as soon as possible. The patient was given instructions for home care as well as return precautions. Patient voices understanding of these instructions, accepts the plan, and is comfortable with discharge.    I spent a total of approximately 45 minutes counseling patient on diabetes care and education exclusive of other assessments and treatments.  Vitals:   10/22/16 1800 10/22/16 1815 10/22/16 1830 10/22/16 1937  BP:   119/75 125/87  Pulse: 82 69 91 89  Resp:    16  Temp:    97.9 F (36.6 C)  TempSrc:    Oral  SpO2: 99% 100% 99% 98%  Weight:      Height:          Final Clinical Impressions(s) / ED Diagnoses   Final diagnoses:  Hyperglycemia  Dehydration  AKI (acute kidney injury) ([place])    New Prescriptions Discharge Medication List as of 10/22/2016  7:48 PM    START taking these medications   Details  !! metFORMIN (GLUCOPHAGE-XR) 500 MG 24 hr tablet Take 1 tablet (500 mg total) by mouth 2 (two) times daily with a meal., Starting Wed 10/22/2016, Until Fri 11/21/2016, Print     !! - Potential duplicate medications found. Please discuss with provider.       Lorayne Bender, PA-C 10/23/16 0103    Charlesetta Shanks, MD 10/29/16 [phone]

## 2016-10-22 NOTE — ED Provider Notes (Addendum)
[facility]   [id number] 10/22/16 Arrival Time: 1034  ASSESSMENT & PLAN:  1. Hyperglycemia   2. Ketonuria   3. Muscle weakness of right upper extremity   4. Precordial pain     No orders of the defined types were placed in this encounter.   EKG shows ST w/o acute ST-T changes Recommend DC and go to ED now, patient will be taken in wheel chair to ED   Reviewed expectations re: course of current medical issues. Questions answered. Outlined signs and symptoms indicating need for more acute intervention. Patient verbalized understanding. After Visit Summary given.   SUBJECTIVE:  Rebecca Johnston is a 46 y.o. female who presents with complaint of hyperglycemia.  She states she had glucose of 486 last night.  She c/o weakness and polydipsia, polyuria, and polyphagia.  Patient c/o right sided weakness starting a week ago and it has improved but states she couldn't move her right upper extremity a few weeks ago.  ROS: As per HPI.   OBJECTIVE:  Vitals:   10/22/16 1135 10/22/16 1136  BP:  (!) 135/96  Pulse:  (!) 105  Resp:  16  Temp:  99 F (37.2 C)  TempSrc:  Oral  SpO2:  100%  Weight: 270 lb (122.5 kg)   Height: 5\' 3"  (1.6 m)      General appearance: alert; no distress Eyes: PERRLA; EOMI; conjunctiva normal HENT: normocephalic; atraumatic; TMs normal; nasal mucosa normal; oral mucosa dry Neck: supple Lungs: clear to auscultation bilaterally Heart: regular rate and rhythm Abdomen: soft, non-tender; bowel sounds normal; no masses or organomegaly; no guarding or rebound tenderness Back: no CVA tenderness Extremities: no cyanosis or edema; symmetrical with no gross deformities Skin: warm and dry Neurologic: normal gait; normal symmetric reflexes Psychological: alert and cooperative; normal mood and affect  Past Medical History:  Diagnosis Date  . Anemia   . Asthma   . Fibroids   . GERD (gastroesophageal reflux disease)   . History of blood transfusion  Feb 2015  . Obesity, Class III, BMI 40-49.9 (morbid obesity) ([place])   . Shortness of breath dyspnea      has a past medical history of Anemia; Asthma; Fibroids; GERD (gastroesophageal reflux disease); History of blood transfusion (Feb 2015); Obesity, Class III, BMI 40-49.9 (morbid obesity) ([place]); and Shortness of breath dyspnea.    Labs Reviewed  POCT URINALYSIS DIP (DEVICE) - Abnormal; Notable for the following:       Result Value   Glucose, UA 500 (*)    Ketones, ur TRACE (*)    Hgb urine dipstick SMALL (*)    Protein, ur 100 (*)    All other components within normal limits  POCT I-STAT, CHEM 8 - Abnormal; Notable for the following:    Sodium 131 (*)    Chloride 98 (*)    BUN 26 (*)    Creatinine, Ser 1.70 (*)    Glucose, Bld 512 (*)    Hemoglobin 16.3 (*)    HCT 48.0 (*)    All other components within normal limits    Imaging: No results found.  No Known Allergies  Family History  Problem Relation Age of Onset  . Hypertension Mother   . Diabetes Father   . Breast cancer Maternal Aunt   . Diabetes Maternal Aunt   . Diabetes Paternal Aunt   . Diabetes Maternal Aunt   . Diabetes Maternal Aunt    Past Surgical History:  Procedure Laterality Date  . ABDOMINAL HYSTERECTOMY N/A  12/08/2014   Procedure: HYSTERECTOMY ABDOMINAL;  Surgeon: Woodroe Mode, MD;  Location: [facility];  Service: Gynecology;  Laterality: N/A;  . DILATION AND CURETTAGE OF UTERUS    . DILITATION & CURRETTAGE/HYSTROSCOPY WITH VERSAPOINT RESECTION N/A 01/06/2014   Procedure: Corena Pilgrim WITH VERSAPOINT;  Surgeon: Woodroe Mode, MD;  Location: [facility];  Service: Gynecology;  Laterality: N/A;  . LAPAROSCOPIC LYSIS OF ADHESIONS N/A 12/08/2014   Procedure: LAPAROSCOPIC LYSIS OF ADHESIONS;  Surgeon: Michael Boston, MD;  Location: [facility];  Service: General;  Laterality: N/A;  . OVARIAN CYST REMOVAL     cyst not removed. Cyst drained  . SUPRA-UMBILICAL HERNIA N/A 85/0/2774   Procedure: LAPARSCOPIC SUPRA-UMBILICAL  HERNIA REPAIR ;  Surgeon: Michael Boston, MD;  Location: [facility];  Service: General;  Laterality: N/A;  . VENTRAL HERNIA REPAIR N/A 12/08/2014   Procedure: LAPAROSCOPIC INCARCERATED INCISIONAL VENTRAL WALL HERNIA REPAIR;  Surgeon: Michael Boston, MD;  Location: [facility];  Service: General;  Laterality: Netta Neat, [place] 10/22/16 [place], [place], FNP 10/22/16 1234

## 2016-10-22 NOTE — ED Triage Notes (Signed)
Pt sent from [facility] with complaints of hyperglycemia x several days. Pt reports having blurred vision x 1 week and weakness in tight had with pain in right forearm when making a fist. Pt is on metformin for DM for reports she does not take it daily.

## 2016-10-22 NOTE — ED Notes (Signed)
PT taken to ED via wheelchair by RN.

## 2016-10-22 NOTE — ED Triage Notes (Signed)
Last night glucose was 486, this morning it was 430. PT reports blurred vision, and weakness for 1 week. PT reports she cannot use right hand as well as she usually can. This started Saturday. PT reports a pain in right forearm while making a fist with right hand.   No stroke history.  PT has been taking Metformin, but she only takes it sometimes.

## 2016-10-22 NOTE — Discharge Instructions (Signed)
You have been seen today for hyperglycemia. Please be sure to stay well hydrated and take your medications, as prescribed. Please follow up with a primary care provider as soon as possible for continued management of this issue. Refer to the attached sheets for more information. Return to the ED for worsening symptoms.

## 2016-10-22 NOTE — ED Notes (Signed)
ED Provider at bedside. 

## 2016-10-22 NOTE — Discharge Instructions (Signed)
Need to go directly to ED

## 2016-10-23 ENCOUNTER — Encounter (HOSPITAL_COMMUNITY): Payer: Self-pay | Admitting: Emergency Medicine

## 2016-10-24 ENCOUNTER — Ambulatory Visit: Payer: Medicaid Other | Admitting: Family Medicine

## 2017-01-02 ENCOUNTER — Other Ambulatory Visit: Payer: Self-pay | Admitting: Pharmacist

## 2017-01-02 DIAGNOSIS — R7309 Other abnormal glucose: Secondary | ICD-10-CM

## 2017-01-02 MED ORDER — ACCU-CHEK AVIVA PLUS W/DEVICE KIT: 1.0000 | PACK | Freq: Two times a day (BID) | 0 refills | Status: DC

## 2017-02-06 ENCOUNTER — Telehealth: Payer: Self-pay

## 2017-02-06 DIAGNOSIS — R7309 Other abnormal glucose: Secondary | ICD-10-CM

## 2017-02-06 DIAGNOSIS — Z6841 Body Mass Index (BMI) 40.0 and over, adult: Principal | ICD-10-CM

## 2017-02-06 MED ORDER — METFORMIN HCL ER 500 MG PO TB24: 500.0000 mg | ORAL_TABLET | Freq: Two times a day (BID) | ORAL | 0 refills | Status: DC

## 2017-02-06 NOTE — Telephone Encounter (Signed)
Pt called to request a refill for  metFORMIN (GLUCOPHAGE XR) 500 MG 24 hr tablet  She want to used [facility] on [facility]. Please follow up

## 2017-02-06 NOTE — Telephone Encounter (Signed)
Refilled x 30 days, last refill without office visit.

## 2017-02-17 ENCOUNTER — Encounter (HOSPITAL_COMMUNITY): Payer: Self-pay

## 2017-04-16 ENCOUNTER — Encounter: Payer: Self-pay | Admitting: Internal Medicine

## 2017-04-16 ENCOUNTER — Ambulatory Visit: Payer: Medicaid Other | Attending: Internal Medicine | Admitting: Internal Medicine

## 2017-04-16 VITALS — BP 123/86 | HR 108 | Temp 97.5°F | Resp 16 | Ht 64.0 in | Wt 275.4 lb

## 2017-04-16 DIAGNOSIS — N289 Disorder of kidney and ureter, unspecified: Secondary | ICD-10-CM | POA: Diagnosis not present

## 2017-04-16 DIAGNOSIS — Z833 Family history of diabetes mellitus: Secondary | ICD-10-CM | POA: Insufficient documentation

## 2017-04-16 DIAGNOSIS — Z90710 Acquired absence of both cervix and uterus: Secondary | ICD-10-CM | POA: Insufficient documentation

## 2017-04-16 DIAGNOSIS — Z87891 Personal history of nicotine dependence: Secondary | ICD-10-CM | POA: Diagnosis not present

## 2017-04-16 DIAGNOSIS — E1140 Type 2 diabetes mellitus with diabetic neuropathy, unspecified: Secondary | ICD-10-CM | POA: Insufficient documentation

## 2017-04-16 DIAGNOSIS — Z794 Long term (current) use of insulin: Secondary | ICD-10-CM

## 2017-04-16 DIAGNOSIS — Z79899 Other long term (current) drug therapy: Secondary | ICD-10-CM | POA: Insufficient documentation

## 2017-04-16 DIAGNOSIS — N183 Chronic kidney disease, stage 3 unspecified: Secondary | ICD-10-CM

## 2017-04-16 DIAGNOSIS — L0293 Carbuncle, unspecified: Secondary | ICD-10-CM | POA: Diagnosis not present

## 2017-04-16 DIAGNOSIS — Z6841 Body Mass Index (BMI) 40.0 and over, adult: Secondary | ICD-10-CM | POA: Diagnosis not present

## 2017-04-16 DIAGNOSIS — E1122 Type 2 diabetes mellitus with diabetic chronic kidney disease: Secondary | ICD-10-CM | POA: Insufficient documentation

## 2017-04-16 DIAGNOSIS — Z8249 Family history of ischemic heart disease and other diseases of the circulatory system: Secondary | ICD-10-CM | POA: Diagnosis not present

## 2017-04-16 DIAGNOSIS — Z7984 Long term (current) use of oral hypoglycemic drugs: Secondary | ICD-10-CM | POA: Diagnosis not present

## 2017-04-16 DIAGNOSIS — J301 Allergic rhinitis due to pollen: Secondary | ICD-10-CM | POA: Diagnosis not present

## 2017-04-16 DIAGNOSIS — E119 Type 2 diabetes mellitus without complications: Secondary | ICD-10-CM

## 2017-04-16 DIAGNOSIS — K219 Gastro-esophageal reflux disease without esophagitis: Secondary | ICD-10-CM | POA: Diagnosis not present

## 2017-04-16 DIAGNOSIS — Z23 Encounter for immunization: Secondary | ICD-10-CM | POA: Insufficient documentation

## 2017-04-16 HISTORY — DX: Chronic kidney disease, stage 3 unspecified: N18.30

## 2017-04-16 LAB — GLUCOSE, POCT (MANUAL RESULT ENTRY): POC GLUCOSE: 138 mg/dL — AB (ref 70–99)

## 2017-04-16 MED ORDER — MUPIROCIN CALCIUM 2 % EX CREA
TOPICAL_CREAM | CUTANEOUS | 0 refills | Status: DC
Start: 2017-04-16 — End: 2017-04-17

## 2017-04-16 MED ORDER — ALBUTEROL SULFATE HFA 108 (90 BASE) MCG/ACT IN AERS: 2.0000 | INHALATION_SPRAY | Freq: Four times a day (QID) | RESPIRATORY_TRACT | 3 refills | Status: DC | PRN

## 2017-04-16 MED ORDER — FLUTICASONE PROPIONATE 50 MCG/ACT NA SUSP: 2.0000 | Freq: Every day | NASAL | 5 refills | Status: DC

## 2017-04-16 MED ORDER — RANITIDINE HCL 300 MG PO TABS: ORAL_TABLET | ORAL | 6 refills | Status: DC

## 2017-04-16 MED ORDER — METFORMIN HCL ER 500 MG PO TB24: 500.0000 mg | ORAL_TABLET | Freq: Two times a day (BID) | ORAL | 6 refills | Status: DC

## 2017-04-16 MED ORDER — CETIRIZINE HCL 10 MG PO TABS: 10.0000 mg | ORAL_TABLET | Freq: Every day | ORAL | 6 refills | Status: DC

## 2017-04-16 NOTE — Progress Notes (Signed)
Patient ID: Rebecca Johnston, female    DOB: [birth year]  MRN: [id number]  CC: re-establish; Diabetes; and Medication Refill   Subjective: Rebecca Johnston is a 47 y.o. female who presents for chronic ds management.  Last saw Dr. Adrian Blackwater 04/2016 Her concerns today include:  Pt with hx of obesity, DM with neuropathy and CKD 3  1.  DM:  diag in 2016.  States she was in denial for a while.  Seen in ER in 10/2016 with BS in 500s -Since then she has been taking metformin consistently and BS under 200.  Checks 2 x a wk Eating habits:  Drinking more water.  She has dec consumption of white bread.  Eating more fruits.  Exercise: tries to walk when she can but not as often as she should.  Does have gym membership but has not been in a while Med: compliant with metformin Last eye exam in 12/2016 at [facility]  2.  Request RF on Ranitidine for reflux symptoms.  She takes it as needed  3.  Recurrent boils on abdomen: This is an intermittent issue.  She currently does not have an active lesion  4.  Requests refill on Zyrtec, albuterol and Flonase nasal spray.  Allergy symptoms in the spring Patient Active Problem List   Diagnosis Date Noted  . Neuropathy 04/04/2016  . Renal impairment 12/25/2015  . Morbid obesity with BMI of 45.0-49.9, adult ([place]) 12/21/2015  . Hx of iron deficiency anemia 12/21/2015  . Seasonal allergic rhinitis 12/21/2015  . Type 2 diabetes mellitus without complications ([place]) 76/16/0737  . Incarcerated hernia s/p lap repair w mesh 12/08/2014 12/08/2014  . Esophageal reflux 10/21/2013  . Nasal congestion 10/21/2013  . Fibroid, uterine with menorrhagia s/p TAH 12/08/2014 10/03/2013  . Chest pain, unspecified 09/02/2013  . Abnormal CT scan, chest 09/02/2013     Current Outpatient Medications on File Prior to Visit  Medication Sig Dispense Refill  . gabapentin (NEURONTIN) 100 MG capsule Take 100 mg by mouth 1-3 times daily as needed for nerve pains 90 capsule 1  . ACCU-CHEK  SOFTCLIX LANCETS lancets 1 each by Other route 2 (two) times daily. 100 each 12  . acetaminophen (TYLENOL) 500 MG tablet Take 1,000 mg by mouth every 6 (six) hours as needed for moderate pain.    . Blood Glucose Monitoring Suppl (ACCU-CHEK AVIVA PLUS) w/Device KIT 1 Device by Does not apply route 2 (two) times daily. 1 kit 0  . glucose blood (ACCU-CHEK AVIVA PLUS) test strip 1 each by Other route 2 (two) times daily. 100 each 12   No current facility-administered medications on file prior to visit.     No Known Allergies  Social History   Socioeconomic History  . Marital status: Single    Spouse name: Not on file  . Number of children: Not on file  . Years of education: Not on file  . Highest education level: Not on file  Social Needs  . Financial resource strain: Not on file  . Food insecurity - worry: Not on file  . Food insecurity - inability: Not on file  . Transportation needs - medical: Not on file  . Transportation needs - non-medical: Not on file  Occupational History  . Not on file  Tobacco Use  . Smoking status: Former Smoker    Packs/day: 0.25    Years: 25.00    Pack years: 6.25    Types: Cigarettes    Last attempt to quit: 08/02/2015  Years since quitting: 1.7  . Smokeless tobacco: Never Used  Substance and Sexual Activity  . Alcohol use: Yes    Alcohol/week: 0.0 oz    Comment: socially  . Drug use: No  . Sexual activity: Not Currently    Birth control/protection: None  Other Topics Concern  . Not on file  Social History Narrative  . Not on file    Family History  Problem Relation Age of Onset  . Hypertension Mother   . Diabetes Father   . Breast cancer Maternal Aunt   . Diabetes Maternal Aunt   . Diabetes Paternal Aunt   . Diabetes Maternal Aunt   . Diabetes Maternal Aunt     Past Surgical History:  Procedure Laterality Date  . ABDOMINAL HYSTERECTOMY N/A 12/08/2014   Procedure: HYSTERECTOMY ABDOMINAL;  Surgeon: Woodroe Mode, MD;  Location:  [facility];  Service: Gynecology;  Laterality: N/A;  . DILATION AND CURETTAGE OF UTERUS    . DILITATION & CURRETTAGE/HYSTROSCOPY WITH VERSAPOINT RESECTION N/A 01/06/2014   Procedure: Corena Pilgrim WITH VERSAPOINT;  Surgeon: Woodroe Mode, MD;  Location: [facility];  Service: Gynecology;  Laterality: N/A;  . LAPAROSCOPIC LYSIS OF ADHESIONS N/A 12/08/2014   Procedure: LAPAROSCOPIC LYSIS OF ADHESIONS;  Surgeon: Michael Boston, MD;  Location: [facility];  Service: General;  Laterality: N/A;  . OVARIAN CYST REMOVAL     cyst not removed. Cyst drained  . SUPRA-UMBILICAL HERNIA N/A 61/08/735   Procedure: LAPARSCOPIC SUPRA-UMBILICAL HERNIA REPAIR ;  Surgeon: Michael Boston, MD;  Location: [facility];  Service: General;  Laterality: N/A;  . VENTRAL HERNIA REPAIR N/A 12/08/2014   Procedure: LAPAROSCOPIC INCARCERATED INCISIONAL VENTRAL WALL HERNIA REPAIR;  Surgeon: Michael Boston, MD;  Location: [facility];  Service: General;  Laterality: N/A;    ROS: Review of Systems Negative except as stated above PHYSICAL EXAM: BP 123/86   Pulse (!) 108   Temp (!) 97.5 F (36.4 C) (Oral)   Resp 16   Ht '5\' 4"'  (1.626 m)   Wt 275 lb 6.4 oz (124.9 kg)   LMP 11/26/2014 (Exact Date)   SpO2 98%   BMI 47.27 kg/m   Wt Readings from Last 3 Encounters:  04/16/17 275 lb 6.4 oz (124.9 kg)  10/22/16 276 lb (125.2 kg)  10/22/16 270 lb (122.5 kg)    Physical Exam  General appearance - alert, well appearing, obese middle-aged female and in no distress Mental status - alert, oriented to person, place, and time, normal mood, behavior, speech, dress, motor activity, and thought processes Neck - supple, no significant adenopathy Chest - clear to auscultation, no wheezes, rales or rhonchi, symmetric air entry Heart - normal rate, regular rhythm, normal S1, S2, no murmurs, rubs, clicks or gallops Extremities - peripheral pulses normal, no pedal edema, no clubbing or cyanosis Skin -no boils on the skin at this time  Diabetic Foot Exam - Simple     Simple Foot Form Visual Inspection See comments:  Yes Sensation Testing Intact to touch and monofilament testing bilaterally:  Yes Pulse Check Posterior Tibialis and Dorsalis pulse intact bilaterally:  Yes Comments Toe nails over grown.  Small callous medial aspect both 1st toes     Depression screen [facility] 2/9 04/16/2017 04/04/2016 12/21/2015  Decreased Interest '2 1 1  ' Down, Depressed, Hopeless 0 0 1  PHQ - 2 Score '2 1 2  ' Altered sleeping 0 1 0  Tired, decreased energy 0 1 0  Change in appetite 0 0 0  Feeling bad or failure  about yourself  0 0 0  Trouble concentrating 0 0 0  Moving slowly or fidgety/restless 0 0 -  Suicidal thoughts 0 0 -  PHQ-9 Score '2 3 2   ' BS 138    Chemistry      Component Value Date/Time   NA 128 (L) 10/22/2016 1653   K 4.7 10/22/2016 1653   CL 97 (L) 10/22/2016 1653   CO2 19 (L) 10/22/2016 1653   BUN 20 10/22/2016 1653   CREATININE 1.61 (H) 10/22/2016 1653   CREATININE 1.65 (H) 12/21/2015 1019      Component Value Date/Time   CALCIUM 10.1 10/22/2016 1653   ALKPHOS 178 (H) 10/22/2016 1653   AST 37 10/22/2016 1653   ALT 28 10/22/2016 1653   BILITOT 0.7 10/22/2016 1653      ASSESSMENT AND PLAN: 1. Type 2 diabetes mellitus without complication, with long-term current use of insulin (HCC) -We will check A1c today to better assess her level of control.  Commended her on her efforts so far to eat healthier.  Further tips given today - POCT glucose (manual entry) - Hemoglobin A1c - Microalbumin / creatinine urine ratio - Comprehensive metabolic panel - CBC - Lipid panel - metFORMIN (GLUCOPHAGE XR) 500 MG 24 hr tablet; Take 1 tablet (500 mg total) by mouth 2 (two) times daily after a meal.  Dispense: 60 tablet; Refill: 6  2. Morbid obesity with BMI of 45.0-49.9, adult ([place]) -Discussed healthy eating habits. Encourage some form of regular aerobic exercise at least 3-4 days a week for 30 minutes.  We discussed how she can incorporate exercise into  her daily routine and exercise activities she can do at home as going to the gym is not always convenient  3. Seasonal allergic rhinitis due to pollen - fluticasone (FLONASE) 50 MCG/ACT nasal spray; Place 2 sprays into both nostrils daily.  Dispense: 16 g; Refill: 5 - cetirizine (ZYRTEC) 10 MG tablet; Take 1 tablet (10 mg total) by mouth daily.  Dispense: 30 tablet; Refill: 6 - albuterol (PROVENTIL HFA;VENTOLIN HFA) 108 (90 Base) MCG/ACT inhaler; Inhale 2 puffs into the lungs every 6 (six) hours as needed for wheezing or shortness of breath.  Dispense: 1 Inhaler; Refill: 3  4. CKD (chronic kidney disease), stage III (HCC) Avoid NSAIDs  5. 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  Stressed the importance of good handwashing and avoid sharing personal items like soap towels - mupirocin cream (BACTROBAN) 2 %; Apply to affected area BID  Dispense: 30 g; Refill: 0  6. Gastroesophageal reflux disease without esophagitis - ranitidine (ZANTAC) 300 MG tablet; 1 tab PO QHS PRN  Dispense: 30 tablet; Refill: 6  7. Need for influenza vaccination - Flu Vaccine QUAD 6+ mos PF IM (Fluarix Quad PF)  8. Need for Tdap vaccination - Tdap vaccine greater than or equal to 7yo IM  Patient was given the opportunity to ask questions.  Patient verbalized understanding of the plan and was able to repeat key elements of the plan.   Orders Placed This Encounter  Procedures  . Tdap vaccine greater than or equal to 7yo IM  . Flu Vaccine QUAD 6+ mos PF IM (Fluarix Quad PF)  . Hemoglobin A1c  . Microalbumin / creatinine urine ratio  . Comprehensive metabolic panel  . CBC  . Lipid panel  . POCT glucose (manual entry)     Requested Prescriptions   Signed Prescriptions Disp Refills  . metFORMIN (GLUCOPHAGE XR) 500 MG  24 hr tablet 60 tablet 6    Sig: Take 1 tablet (500 mg total) by mouth 2 (two) times daily after a meal.  . ranitidine (ZANTAC) 300 MG tablet 30 tablet 6      Sig: 1 tab PO QHS PRN  . fluticasone (FLONASE) 50 MCG/ACT nasal spray 16 g 5    Sig: Place 2 sprays into both nostrils daily.  . cetirizine (ZYRTEC) 10 MG tablet 30 tablet 6    Sig: Take 1 tablet (10 mg total) by mouth daily.  . mupirocin cream (BACTROBAN) 2 % 30 g 0    Sig: Apply to affected area BID  . albuterol (PROVENTIL HFA;VENTOLIN HFA) 108 (90 Base) MCG/ACT inhaler 1 Inhaler 3    Sig: Inhale 2 puffs into the lungs every 6 (six) hours as needed for wheezing or shortness of breath.    Return in about 4 months (around 08/14/2017).  Karle Plumber, MD, FACP

## 2017-04-16 NOTE — Patient Instructions (Addendum)
Good handwashing recommended.  Avoid sharing towels.  Try to get in some form of aerobic exercise 3-4 times a week for 30 minutes.   Follow a Healthy Eating Plan - You can do it! Limit sugary drinks.  Avoid sodas, sweet tea, sport or energy drinks, or fruit drinks.  Drink water, lo-fat milk, or diet drinks. Limit snack foods.   Cut back on candy, cake, cookies, chips, ice cream.  These are a special treat, only in small amounts. Eat plenty of vegetables.  Especially dark green, red, and orange vegetables. Aim for at least 3 servings a day. More is better! Include fruit in your daily diet.  Whole fruit is much healthier than fruit juice! Limit "white" bread, "white" pasta, "white" rice.   Choose "100% whole grain" products, brown or wild rice. Avoid fatty meats. Try "Meatless Monday" and choose eggs or beans one day a week.  When eating meat, choose lean meats like chicken, [place], and fish.  Grill, broil, or bake meats instead of frying, and eat poultry without the skin. Eat less salt.  Avoid frozen pizzas, frozen dinners and salty foods.  Use seasonings other than salt in cooking.  This can help blood pressure and keep you from swelling Beer, wine and liquor have calories.  If you can safely drink alcohol, limit to 1 drink per day for women, 2 drinks for men    Td Vaccine (Tetanus and Diphtheria): What You Need to Know 1. Why get vaccinated? Tetanus  and diphtheria are very serious diseases. They are rare in the [place] today, but people who do become infected often have severe complications. Td vaccine is used to protect adolescents and adults from both of these diseases. Both tetanus and diphtheria are infections caused by bacteria. Diphtheria spreads from person to person through coughing or sneezing. Tetanus-causing bacteria enter the body through cuts, scratches, or wounds. TETANUS (lockjaw) causes painful muscle tightening and stiffness, usually all over the body.  It can lead to  tightening of muscles in the head and neck so you can't open your mouth, swallow, or sometimes even breathe. Tetanus kills about 1 out of every 10 people who are infected even after receiving the best medical care.  DIPHTHERIA can cause a thick coating to form in the back of the throat.  It can lead to breathing problems, paralysis, heart failure, and death.  Before vaccines, as many as 200,000 cases of diphtheria and hundreds of cases of tetanus were reported in the [place] each year. Since vaccination began, reports of cases for both diseases have dropped by about 99%. 2. Td vaccine Td vaccine can protect adolescents and adults from tetanus and diphtheria. Td is usually given as a booster dose every 10 years but it can also be given earlier after a severe and dirty wound or burn. Another vaccine, called Tdap, which protects against pertussis in addition to tetanus and diphtheria, is sometimes recommended instead of Td vaccine. Your doctor or the person giving you the vaccine can give you more information. Td may safely be given at the same time as other vaccines. 3. Some people should not get this vaccine  A person who has ever had a life-threatening allergic reaction after a previous dose of any tetanus or diphtheria containing vaccine, OR has a severe allergy to any part of this vaccine, should not get Td vaccine. Tell the person giving the vaccine about any severe allergies.  Talk to your doctor if you: ? had severe pain or  swelling after any vaccine containing diphtheria or tetanus, ? ever had a condition called Guillain Barre Syndrome (GBS), ? aren't feeling well on the day the shot is scheduled. 4. What are the risks from Td vaccine? With any medicine, including vaccines, there is a chance of side effects. These are usually mild and go away on their own. Serious reactions are also possible but are rare. Most people who get Td vaccine do not have any problems with it. Mild  problems following Td vaccine: (Did not interfere with activities)  Pain where the shot was given (about 8 people in 10)  Redness or swelling where the shot was given (about 1 person in 4)  Mild fever (rare)  Headache (about 1 person in 4)  Tiredness (about 1 person in 4)  Moderate problems following Td vaccine: (Interfered with activities, but did not require medical attention)  Fever over 102F (rare)  Severe problems following Td vaccine: (Unable to perform usual activities; required medical attention)  Swelling, severe pain, bleeding and/or redness in the arm where the shot was given (rare).  Problems that could happen after any vaccine:  People sometimes faint after a medical procedure, including vaccination. Sitting or lying down for about 15 minutes can help prevent fainting, and injuries caused by a fall. Tell your doctor if you feel dizzy, or have vision changes or ringing in the ears.  Some people get severe pain in the shoulder and have difficulty moving the arm where a shot was given. This happens very rarely.  Any medication can cause a severe allergic reaction. Such reactions from a vaccine are very rare, estimated at fewer than 1 in a million doses, and would happen within a few minutes to a few hours after the vaccination. As with any medicine, there is a very remote chance of a vaccine causing a serious injury or death. The safety of vaccines is always being monitored. For more information, visit: http://www.aguilar.org/ 5. What if there is a serious reaction? What should I look for? Look for anything that concerns you, such as signs of a severe allergic reaction, very high fever, or unusual behavior. Signs of a severe allergic reaction can include hives, swelling of the face and throat, difficulty breathing, a fast heartbeat, dizziness, and weakness. These would usually start a few minutes to a few hours after the vaccination. What should I do?  If you think  it is a severe allergic reaction or other emergency that can't wait, call 9-1-1 or get the person to the nearest hospital. Otherwise, call your doctor.  Afterward, the reaction should be reported to the Vaccine Adverse Event Reporting System (VAERS). Your doctor might file this report, or you can do it yourself through the VAERS web site at www.vaers.SamedayNews.es, or by calling (740) 201-0526. ? VAERS does not give medical advice. 6. The National Vaccine Injury Compensation Program The [employer] Vaccine Injury Compensation Program (VICP) is a federal program that was created to compensate people who may have been injured by certain vaccines. Persons who believe they may have been injured by a vaccine can learn about the program and about filing a claim by calling 610-242-9846 or visiting the [place] website at GoldCloset.com.ee. There is a time limit to file a claim for compensation. 7. How can I learn more?  Ask your doctor. He or she can give you the vaccine package insert or suggest other sources of information.  Call your local or state health department.  Contact the Centers for Disease Control and Prevention (  CDC): ? Call 912-761-5130 (1-800-CDC-INFO) ? Visit CDC's website at http://hunter.com/ CDC Td Vaccine VIS (06/12/15) This information is not intended to replace advice given to you by your health care provider. Make sure you discuss any questions you have with your health care provider. Document Released: 12/15/2005 Document Revised: 11/08/2015 Document Reviewed: 11/08/2015 Elsevier Interactive Patient Education  2017 [place].   Influenza Virus Vaccine injection (Fluarix) What is this medicine? INFLUENZA VIRUS VACCINE (in floo EN zuh VAHY ruhs vak SEEN) helps to reduce the risk of getting influenza also known as the flu. This medicine may be used for other purposes; ask your health care provider or pharmacist if you have questions. COMMON BRAND NAME(S): Fluarix,  Fluzone What should I tell my health care provider before I take this medicine? They need to know if you have any of these conditions: -bleeding disorder like hemophilia -fever or infection -Guillain-Barre syndrome or other neurological problems -immune system problems -infection with the human immunodeficiency virus (HIV) or AIDS -low blood platelet counts -multiple sclerosis -an unusual or allergic reaction to influenza virus vaccine, eggs, chicken proteins, latex, gentamicin, other medicines, foods, dyes or preservatives -pregnant or trying to get pregnant -breast-feeding How should I use this medicine? This vaccine is for injection into a muscle. It is given by a health care professional. A copy of Vaccine Information Statements will be given before each vaccination. Read this sheet carefully each time. The sheet may change frequently. Talk to your pediatrician regarding the use of this medicine in children. Special care may be needed. Overdosage: If you think you have taken too much of this medicine contact a poison control center or emergency room at once. NOTE: This medicine is only for you. Do not share this medicine with others. What if I miss a dose? This does not apply. What may interact with this medicine? -chemotherapy or radiation therapy -medicines that lower your immune system like etanercept, anakinra, infliximab, and adalimumab -medicines that treat or prevent blood clots like warfarin -phenytoin -steroid medicines like prednisone or cortisone -theophylline -vaccines This list may not describe all possible interactions. Give your health care provider a list of all the medicines, herbs, non-prescription drugs, or dietary supplements you use. Also tell them if you smoke, drink alcohol, or use illegal drugs. Some items may interact with your medicine. What should I watch for while using this medicine? Report any side effects that do not go away within 3 days to your  doctor or health care professional. Call your health care provider if any unusual symptoms occur within 6 weeks of receiving this vaccine. You may still catch the flu, but the illness is not usually as bad. You cannot get the flu from the vaccine. The vaccine will not protect against colds or other illnesses that may cause fever. The vaccine is needed every year. What side effects may I notice from receiving this medicine? Side effects that you should report to your doctor or health care professional as soon as possible: -allergic reactions like skin rash, itching or hives, swelling of the face, lips, or tongue Side effects that usually do not require medical attention (report to your doctor or health care professional if they continue or are bothersome): -fever -headache -muscle aches and pains -pain, tenderness, redness, or swelling at site where injected -weak or tired This list may not describe all possible side effects. Call your doctor for medical advice about side effects. You may report side effects to FDA at 1-800-FDA-1088. Where should I keep  my medicine? This vaccine is only given in a clinic, pharmacy, doctor's office, or other health care setting and will not be stored at home. NOTE: This sheet is a summary. It may not cover all possible information. If you have questions about this medicine, talk to your doctor, pharmacist, or health care provider.  2018 Elsevier/Gold Standard (2007-09-15 09:30:40)

## 2017-04-17 ENCOUNTER — Other Ambulatory Visit: Payer: Self-pay | Admitting: Pharmacist

## 2017-04-17 DIAGNOSIS — L0293 Carbuncle, unspecified: Secondary | ICD-10-CM

## 2017-04-17 LAB — MICROALBUMIN / CREATININE URINE RATIO
CREATININE, UR: 177.5 mg/dL
MICROALBUM., U, RANDOM: 13.6 ug/mL
Microalb/Creat Ratio: 7.7 mg/g creat (ref 0.0–30.0)

## 2017-04-17 LAB — COMPREHENSIVE METABOLIC PANEL
A/G RATIO: 1.1 — AB (ref 1.2–2.2)
ALT: 21 IU/L (ref 0–32)
AST: 23 IU/L (ref 0–40)
Albumin: 4.1 g/dL (ref 3.5–5.5)
Alkaline Phosphatase: 145 IU/L — ABNORMAL HIGH (ref 39–117)
BILIRUBIN TOTAL: 0.2 mg/dL (ref 0.0–1.2)
BUN/Creatinine Ratio: 11 (ref 9–23)
BUN: 20 mg/dL (ref 6–24)
CHLORIDE: 103 mmol/L (ref 96–106)
CO2: 21 mmol/L (ref 20–29)
Calcium: 9.7 mg/dL (ref 8.7–10.2)
Creatinine, Ser: 1.82 mg/dL — ABNORMAL HIGH (ref 0.57–1.00)
GFR calc Af Amer: 38 mL/min/{1.73_m2} — ABNORMAL LOW (ref >59)
GFR calc non Af Amer: 33 mL/min/{1.73_m2} — ABNORMAL LOW (ref >59)
GLOBULIN, TOTAL: 3.6 g/dL (ref 1.5–4.5)
Glucose: 111 mg/dL — ABNORMAL HIGH (ref 65–99)
POTASSIUM: 4.5 mmol/L (ref 3.5–5.2)
SODIUM: 139 mmol/L (ref 134–144)
Total Protein: 7.7 g/dL (ref 6.0–8.5)

## 2017-04-17 LAB — CBC
Hematocrit: 38.5 % (ref 34.0–46.6)
Hemoglobin: 12.6 g/dL (ref 11.1–15.9)
MCH: 22 pg — ABNORMAL LOW (ref 26.6–33.0)
MCHC: 32.7 g/dL (ref 31.5–35.7)
MCV: 67 fL — ABNORMAL LOW (ref 79–97)
PLATELETS: 352 10*3/uL (ref 150–379)
RBC: 5.72 x10E6/uL — AB (ref 3.77–5.28)
RDW: 17 % — ABNORMAL HIGH (ref 12.3–15.4)
WBC: 9.5 10*3/uL (ref 3.4–10.8)

## 2017-04-17 LAB — LIPID PANEL
Chol/HDL Ratio: 5.1 ratio — ABNORMAL HIGH (ref 0.0–4.4)
Cholesterol, Total: 197 mg/dL (ref 100–199)
HDL: 39 mg/dL — ABNORMAL LOW (ref >39)
LDL Calculated: 97 mg/dL (ref 0–99)
Triglycerides: 306 mg/dL — ABNORMAL HIGH (ref 0–149)
VLDL CHOLESTEROL CAL: 61 mg/dL — AB (ref 5–40)

## 2017-04-17 LAB — HEMOGLOBIN A1C
ESTIMATED AVERAGE GLUCOSE: 131 mg/dL
Hgb A1c MFr Bld: 6.2 % — ABNORMAL HIGH (ref 4.8–5.6)

## 2017-04-17 MED ORDER — PROAIR HFA 108 (90 BASE) MCG/ACT IN AERS: 2.0000 | INHALATION_SPRAY | Freq: Four times a day (QID) | RESPIRATORY_TRACT | 3 refills | Status: DC | PRN

## 2017-04-17 MED ORDER — BACTROBAN 2 % EX CREA: TOPICAL_CREAM | CUTANEOUS | 0 refills | Status: DC

## 2017-04-19 ENCOUNTER — Telehealth: Payer: Self-pay | Admitting: Internal Medicine

## 2017-04-19 DIAGNOSIS — D509 Iron deficiency anemia, unspecified: Secondary | ICD-10-CM

## 2017-04-19 DIAGNOSIS — N183 Chronic kidney disease, stage 3 unspecified: Secondary | ICD-10-CM

## 2017-04-19 MED ORDER — GLIMEPIRIDE 2 MG PO TABS: 2.0000 mg | ORAL_TABLET | Freq: Every day | ORAL | 3 refills | Status: DC

## 2017-04-19 NOTE — Telephone Encounter (Signed)
PC placed to pt this a.m.  Pt informed that AiC was 6.2.  Her kidney function has declined more since last checked 10/2016.  I recommend stopping Metformin and starting med called Amaryl instead.  Advised to check BS at least daily while on this new med to make sure BS not increasing or dropping too low.   Also recommend referral to nephrology.  I asked about whether she has [place] trait or was she anemic in past.  Pt reports IDA in past due to heavy menses/fibroids. Had hysterectomy.  Recommend iron level check.  I will also do Hb electrophoresis. Pt will come as future lab visit or do on next visit. Pt expressed understanding of plan.  Results for orders placed or performed in visit on 04/16/17  Hemoglobin A1c  Result Value Ref Range   Hgb A1c MFr Bld 6.2 (H) 4.8 - 5.6 %   Est. average glucose Bld gHb Est-mCnc 131 mg/dL  Microalbumin / creatinine urine ratio  Result Value Ref Range   Creatinine, Urine 177.5 Not Estab. mg/dL   Microalbumin, Urine 13.6 Not Estab. ug/mL   Microalb/Creat Ratio 7.7 0.0 - 30.0 mg/g creat  Comprehensive metabolic panel  Result Value Ref Range   Glucose 111 (H) 65 - 99 mg/dL   BUN 20 6 - 24 mg/dL   Creatinine, Ser 1.82 (H) 0.57 - 1.00 mg/dL   GFR calc non Af Amer 33 (L) >59 mL/min/1.73   GFR calc Af Amer 38 (L) >59 mL/min/1.73   BUN/Creatinine Ratio 11 9 - 23   Sodium 139 134 - 144 mmol/L   Potassium 4.5 3.5 - 5.2 mmol/L   Chloride 103 96 - 106 mmol/L   CO2 21 20 - 29 mmol/L   Calcium 9.7 8.7 - 10.2 mg/dL   Total Protein 7.7 6.0 - 8.5 g/dL   Albumin 4.1 3.5 - 5.5 g/dL   Globulin, Total 3.6 1.5 - 4.5 g/dL   Albumin/Globulin Ratio 1.1 (L) 1.2 - 2.2   Bilirubin Total 0.2 0.0 - 1.2 mg/dL   Alkaline Phosphatase 145 (H) 39 - 117 IU/L   AST 23 0 - 40 IU/L   ALT 21 0 - 32 IU/L  CBC  Result Value Ref Range   WBC 9.5 3.4 - 10.8 x10E3/uL   RBC 5.72 (H) 3.77 - 5.28 x10E6/uL   Hemoglobin 12.6 11.1 - 15.9 g/dL   Hematocrit 38.5 34.0 - 46.6 %   MCV 67 (L) 79 - 97 fL    MCH 22.0 (L) 26.6 - 33.0 pg   MCHC 32.7 31.5 - 35.7 g/dL   RDW 17.0 (H) 12.3 - 15.4 %   Platelets 352 150 - 379 x10E3/uL  Lipid panel  Result Value Ref Range   Cholesterol, Total 197 100 - 199 mg/dL   Triglycerides 306 (H) 0 - 149 mg/dL   HDL 39 (L) >39 mg/dL   VLDL Cholesterol Cal 61 (H) 5 - 40 mg/dL   LDL Calculated 97 0 - 99 mg/dL   Chol/HDL Ratio 5.1 (H) 0.0 - 4.4 ratio  POCT glucose (manual entry)  Result Value Ref Range   POC Glucose 138 (A) 70 - 99 mg/dl

## 2017-04-20 ENCOUNTER — Other Ambulatory Visit: Payer: Self-pay | Admitting: Pharmacist

## 2017-04-20 DIAGNOSIS — L0293 Carbuncle, unspecified: Secondary | ICD-10-CM

## 2017-04-20 MED ORDER — BACTROBAN 2 % EX CREA: TOPICAL_CREAM | CUTANEOUS | 0 refills | Status: DC

## 2017-04-22 ENCOUNTER — Other Ambulatory Visit: Payer: Self-pay | Admitting: Pharmacist

## 2017-04-22 MED ORDER — MUPIROCIN 2 % EX OINT: 1.0000 "application " | TOPICAL_OINTMENT | Freq: Two times a day (BID) | CUTANEOUS | 0 refills | Status: DC

## 2017-08-27 ENCOUNTER — Other Ambulatory Visit: Payer: Self-pay | Admitting: Internal Medicine

## 2017-09-07 DIAGNOSIS — N183 Chronic kidney disease, stage 3 (moderate): Secondary | ICD-10-CM | POA: Diagnosis not present

## 2017-09-07 DIAGNOSIS — Z6841 Body Mass Index (BMI) 40.0 and over, adult: Secondary | ICD-10-CM | POA: Diagnosis not present

## 2017-10-05 ENCOUNTER — Other Ambulatory Visit: Payer: Self-pay | Admitting: Pharmacy Technician

## 2017-10-05 DIAGNOSIS — Z1231 Encounter for screening mammogram for malignant neoplasm of breast: Secondary | ICD-10-CM

## 2017-10-26 ENCOUNTER — Other Ambulatory Visit: Payer: Self-pay | Admitting: Internal Medicine

## 2017-10-26 ENCOUNTER — Ambulatory Visit
Admission: RE | Admit: 2017-10-26 | Discharge: 2017-10-26 | Disposition: A | Discharge status: Home / Self Care | Payer: Medicaid Other | Source: Ambulatory Visit | Attending: Emergency Medicine | Admitting: Emergency Medicine

## 2017-10-26 DIAGNOSIS — Z1231 Encounter for screening mammogram for malignant neoplasm of breast: Secondary | ICD-10-CM

## 2017-11-06 ENCOUNTER — Encounter: Payer: Self-pay | Admitting: Internal Medicine

## 2017-11-06 ENCOUNTER — Ambulatory Visit: Payer: Medicaid Other | Attending: Internal Medicine | Admitting: Internal Medicine

## 2017-11-06 VITALS — BP 111/81 | HR 100 | Temp 98.6°F | Resp 16 | Ht 65.0 in | Wt 273.2 lb

## 2017-11-06 DIAGNOSIS — Z79899 Other long term (current) drug therapy: Secondary | ICD-10-CM | POA: Insufficient documentation

## 2017-11-06 DIAGNOSIS — Z90710 Acquired absence of both cervix and uterus: Secondary | ICD-10-CM | POA: Diagnosis not present

## 2017-11-06 DIAGNOSIS — Z7951 Long term (current) use of inhaled steroids: Secondary | ICD-10-CM | POA: Insufficient documentation

## 2017-11-06 DIAGNOSIS — Z7984 Long term (current) use of oral hypoglycemic drugs: Secondary | ICD-10-CM | POA: Diagnosis not present

## 2017-11-06 DIAGNOSIS — K219 Gastro-esophageal reflux disease without esophagitis: Secondary | ICD-10-CM | POA: Insufficient documentation

## 2017-11-06 DIAGNOSIS — Z87891 Personal history of nicotine dependence: Secondary | ICD-10-CM | POA: Insufficient documentation

## 2017-11-06 DIAGNOSIS — N183 Chronic kidney disease, stage 3 unspecified: Secondary | ICD-10-CM

## 2017-11-06 DIAGNOSIS — E1122 Type 2 diabetes mellitus with diabetic chronic kidney disease: Secondary | ICD-10-CM | POA: Diagnosis not present

## 2017-11-06 DIAGNOSIS — E118 Type 2 diabetes mellitus with unspecified complications: Secondary | ICD-10-CM | POA: Diagnosis not present

## 2017-11-06 DIAGNOSIS — Z833 Family history of diabetes mellitus: Secondary | ICD-10-CM | POA: Insufficient documentation

## 2017-11-06 DIAGNOSIS — Z6841 Body Mass Index (BMI) 40.0 and over, adult: Secondary | ICD-10-CM | POA: Diagnosis not present

## 2017-11-06 DIAGNOSIS — E119 Type 2 diabetes mellitus without complications: Secondary | ICD-10-CM | POA: Diagnosis not present

## 2017-11-06 DIAGNOSIS — E1140 Type 2 diabetes mellitus with diabetic neuropathy, unspecified: Secondary | ICD-10-CM | POA: Diagnosis not present

## 2017-11-06 DIAGNOSIS — Z794 Long term (current) use of insulin: Secondary | ICD-10-CM

## 2017-11-06 DIAGNOSIS — Z8249 Family history of ischemic heart disease and other diseases of the circulatory system: Secondary | ICD-10-CM | POA: Insufficient documentation

## 2017-11-06 LAB — POCT GLYCOSYLATED HEMOGLOBIN (HGB A1C): HbA1c, POC (prediabetic range): 6.2 % (ref 5.7–6.4)

## 2017-11-06 LAB — GLUCOSE, POCT (MANUAL RESULT ENTRY): POC Glucose: 152 mg/dl — AB (ref 70–99)

## 2017-11-06 NOTE — Patient Instructions (Signed)

## 2017-11-06 NOTE — Progress Notes (Signed)
Patient ID: Rebecca Johnston, female    DOB: 07-27-1970  MRN: [id number]  CC: Diabetes   Subjective: Rebecca Johnston is a 47 y.o. female who presents for chronic ds management Her concerns today include:  Pt with hx of obesity, DM with neuropathy and CKD 3  CKD: Kidney function had worsened on blood studies done on visit in February.  She was referred to nephrology.  She saw nephrology over the summer and had repeat blood test done.  She states that her creatinine was up to 1.9.  She received education from the nephrologist about kidney health.  She is not taking any NSAIDs.    DM:  Checks BS QOD. Gives range 110s. One day in the 300s.  Reports compliance with Amaryl. Not getting in much exercise. She has cut back on sweets.  Eating more fruits.  Drinks water and juices. No blurred vision. She declines flu shot today.  States that she plans to get it later in the flu season. Patient Active Problem List   Diagnosis Date Noted  . Recurrent boils 04/16/2017  . CKD (chronic kidney disease), stage III ([place]) 04/16/2017  . Neuropathy 04/04/2016  . Morbid obesity with BMI of 45.0-49.9, adult ([place]) 12/21/2015  . Hx of iron deficiency anemia 12/21/2015  . Seasonal allergic rhinitis 12/21/2015  . Type 2 diabetes mellitus without complications ([place]) 02/63/7858  . Incarcerated hernia s/p lap repair w mesh 12/08/2014 12/08/2014  . Esophageal reflux 10/21/2013  . Abnormal CT scan, chest 09/02/2013     Current Outpatient Medications on File Prior to Visit  Medication Sig Dispense Refill  . ACCU-CHEK SOFTCLIX LANCETS lancets 1 each by Other route 2 (two) times daily. 100 each 12  . acetaminophen (TYLENOL) 500 MG tablet Take 1,000 mg by mouth every 6 (six) hours as needed for moderate pain.    . Blood Glucose Monitoring Suppl (ACCU-CHEK AVIVA PLUS) w/Device KIT 1 Device by Does not apply route 2 (two) times daily. 1 kit 0  . cetirizine (ZYRTEC) 10 MG tablet Take 1 tablet (10 mg total) by mouth daily.  30 tablet 6  . fluticasone (FLONASE) 50 MCG/ACT nasal spray Place 2 sprays into both nostrils daily. 16 g 5  . gabapentin (NEURONTIN) 100 MG capsule Take 100 mg by mouth 1-3 times daily as needed for nerve pains 90 capsule 1  . glimepiride (AMARYL) 2 MG tablet TAKE 1 TABLET BY MOUTH ONCE DAILY BEFORE BREAKFAST 30 tablet 3  . glucose blood (ACCU-CHEK AVIVA PLUS) test strip 1 each by Other route 2 (two) times daily. 100 each 12  . mupirocin ointment (BACTROBAN) 2 % Apply 1 application topically 2 (two) times daily. 22 g 0  . PROAIR HFA 108 (90 Base) MCG/ACT inhaler Inhale 2 puffs into the lungs every 6 (six) hours as needed for wheezing or shortness of breath. 1 Inhaler 3  . ranitidine (ZANTAC) 300 MG tablet 1 tab PO QHS PRN 30 tablet 6   No current facility-administered medications on file prior to visit.     No Known Allergies  Social History   Socioeconomic History  . Marital status: Single    Spouse name: Not on file  . Number of children: Not on file  . Years of education: Not on file  . Highest education level: Not on file  Occupational History  . Not on file  Social Needs  . Financial resource strain: Not on file  . Food insecurity:    Worry: Not on file  Inability: Not on file  . Transportation needs:    Medical: Not on file    Non-medical: Not on file  Tobacco Use  . Smoking status: Former Smoker    Packs/day: 0.25    Years: 25.00    Pack years: 6.25    Types: Cigarettes    Last attempt to quit: 08/02/2015    Years since quitting: 2.2  . Smokeless tobacco: Never Used  Substance and Sexual Activity  . Alcohol use: Yes    Alcohol/week: 0.0 standard drinks    Comment: socially  . Drug use: No  . Sexual activity: Not Currently    Birth control/protection: None  Lifestyle  . Physical activity:    Days per week: Not on file    Minutes per session: Not on file  . Stress: Not on file  Relationships  . Social connections:    Talks on phone: Not on file    Gets  together: Not on file    Attends religious service: Not on file    Active member of club or organization: Not on file    Attends meetings of clubs or organizations: Not on file    Relationship status: Not on file  . Intimate partner violence:    Fear of current or ex partner: Not on file    Emotionally abused: Not on file    Physically abused: Not on file    Forced sexual activity: Not on file  Other Topics Concern  . Not on file  Social History Narrative  . Not on file    Family History  Problem Relation Age of Onset  . Hypertension Mother   . Diabetes Father   . Breast cancer Maternal Aunt   . Diabetes Maternal Aunt   . Diabetes Paternal Aunt   . Diabetes Maternal Aunt   . Diabetes Maternal Aunt     Past Surgical History:  Procedure Laterality Date  . ABDOMINAL HYSTERECTOMY N/A 12/08/2014   Procedure: HYSTERECTOMY ABDOMINAL;  Surgeon: Woodroe Mode, MD;  Location: [facility];  Service: Gynecology;  Laterality: N/A;  . DILATION AND CURETTAGE OF UTERUS    . DILITATION & CURRETTAGE/HYSTROSCOPY WITH VERSAPOINT RESECTION N/A 01/06/2014   Procedure: Corena Pilgrim WITH VERSAPOINT;  Surgeon: Woodroe Mode, MD;  Location: [facility];  Service: Gynecology;  Laterality: N/A;  . LAPAROSCOPIC LYSIS OF ADHESIONS N/A 12/08/2014   Procedure: LAPAROSCOPIC LYSIS OF ADHESIONS;  Surgeon: Michael Boston, MD;  Location: [facility];  Service: General;  Laterality: N/A;  . OVARIAN CYST REMOVAL     cyst not removed. Cyst drained  . SUPRA-UMBILICAL HERNIA N/A 44/10/1854   Procedure: LAPARSCOPIC SUPRA-UMBILICAL HERNIA REPAIR ;  Surgeon: Michael Boston, MD;  Location[facility];  Service: General;  Laterality: N/A;  . VENTRAL HERNIA REPAIR N/A 12/08/2014   Procedure: LAPAROSCOPIC INCARCERATED INCISIONAL VENTRAL WALL HERNIA REPAIR;  Surgeon: Michael Boston, MD;  Location: [facility];  Service: General;  Laterality: N/A;    ROS: Review of Systems Negative except as above. PHYSICAL EXAM: BP 111/81   Pulse 100   Temp 98.6 F  (37 C) (Oral)   Resp 16   Ht 5' 5" (1.651 m)   Wt 273 lb 3.2 oz (123.9 kg)   LMP 11/26/2014 (Exact Date)   SpO2 96%   BMI 45.46 kg/m   Physical Exam  General appearance - alert, well appearing, and in no distress Mental status - normal mood, behavior, speech, dress, motor activity, and thought processes Chest - clear to auscultation, no wheezes,  rales or rhonchi, symmetric air entry Heart - normal rate, regular rhythm, normal S1, S2, no murmurs, rubs, clicks or gallops Extremities - peripheral pulses normal, no pedal edema, no clubbing or cyanosis  Results for orders placed or performed in visit on 11/06/17  POCT glucose (manual entry)  Result Value Ref Range   POC Glucose 152 (A) 70 - 99 mg/dl  POCT glycosylated hemoglobin (Hb A1C)  Result Value Ref Range   Hemoglobin A1C     HbA1c POC (<> result, manual entry)     HbA1c, POC (prediabetic range) 6.2 5.7 - 6.4 %   HbA1c, POC (controlled diabetic range)       ASSESSMENT AND PLAN: 1. Type 2 diabetes mellitus with complication, without long-term current use of insulin (HCC) Continue Amaryl. Discussed the importance of healthy eating habits and regular exercise.  Advised to stop drinking juices. - POCT glucose (manual entry) - POCT glycosylated hemoglobin (Hb A1C)  2. Morbid obesity with BMI of 45.0-49.9, adult ([place]) Dietary counseling given.  Futures trader given.  3. CKD (chronic kidney disease) stage 3, GFR 30-59 ml/min (HCC) Continue to monitor kidney function.  Stressed the importance of good diabetes and blood pressure control.   Patient was given the opportunity to ask questions.  Patient verbalized understanding of the plan and was able to repeat key elements of the plan.   Orders Placed This Encounter  Procedures  . POCT glucose (manual entry)  . POCT glycosylated hemoglobin (Hb A1C)     Requested Prescriptions    No prescriptions requested or ordered in this encounter    Return in about 4 months  (around 03/08/2018).  Karle Plumber, MD, FACP

## 2017-11-30 ENCOUNTER — Telehealth: Payer: Self-pay | Admitting: Internal Medicine

## 2017-11-30 DIAGNOSIS — R29818 Other symptoms and signs involving the nervous system: Secondary | ICD-10-CM

## 2017-11-30 NOTE — Telephone Encounter (Signed)
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 Please follow up with patient.

## 2017-12-01 NOTE — Telephone Encounter (Signed)
Will forward to covering provider.

## 2017-12-01 NOTE — Telephone Encounter (Signed)
Ordered

## 2017-12-15 NOTE — Telephone Encounter (Signed)
Patient wants a referral for a sleep Dr. At [facility] please call Vita Barley at [phone]

## 2017-12-16 NOTE — Telephone Encounter (Signed)
Pt states she will keep the appointment at [facility]

## 2017-12-16 NOTE — Telephone Encounter (Signed)
Good Afternoon  Patient has an appointment at [facility] long sleep study on  12/18/17 @ 8pm

## 2017-12-18 ENCOUNTER — Ambulatory Visit (HOSPITAL_BASED_OUTPATIENT_CLINIC_OR_DEPARTMENT_OTHER): Payer: Medicaid Other | Attending: Family Medicine | Admitting: Internal Medicine

## 2017-12-18 VITALS — Ht 65.0 in | Wt 263.0 lb

## 2017-12-18 DIAGNOSIS — R29818 Other symptoms and signs involving the nervous system: Secondary | ICD-10-CM

## 2017-12-18 DIAGNOSIS — R0683 Snoring: Secondary | ICD-10-CM

## 2017-12-18 DIAGNOSIS — G4733 Obstructive sleep apnea (adult) (pediatric): Secondary | ICD-10-CM | POA: Diagnosis not present

## 2017-12-27 ENCOUNTER — Other Ambulatory Visit: Payer: Self-pay | Admitting: Internal Medicine

## 2017-12-27 DIAGNOSIS — G4733 Obstructive sleep apnea (adult) (pediatric): Secondary | ICD-10-CM

## 2017-12-27 DIAGNOSIS — R0683 Snoring: Secondary | ICD-10-CM | POA: Diagnosis not present

## 2017-12-27 NOTE — Procedures (Signed)
   Patient Name: Rebecca Johnston, Rebecca Johnston Date: 12/18/2017 Gender: Female D.O.B: 1970-05-04 Age (years): 47 Referring Provider: Arnoldo Morale Height (inches): 65 Interpreting Physician: Baird Lyons MD, ABSM Weight (lbs): 263 RPSGT: Baxter Flattery BMI: 44 MRN: [id number] Neck Size: 15.50  CLINICAL INFORMATION Sleep Study Type: NPSG Indication for sleep study: Fatigue, Obesity, Snoring, Witnesses Apnea / Gasping During Sleep Epworth Sleepiness Score: 2  SLEEP STUDY TECHNIQUE As per the AASM Manual for the Scoring of Sleep and Associated Events v2.3 (April 2016) with a hypopnea requiring 4% desaturations.  The channels recorded and monitored were frontal, central and occipital EEG, electrooculogram (EOG), submentalis EMG (chin), nasal and oral airflow, thoracic and abdominal wall motion, anterior tibialis EMG, snore microphone, electrocardiogram, and pulse oximetry.  MEDICATIONS Medications self-administered by patient taken the night of the study : none reported  SLEEP ARCHITECTURE The study was initiated at 11:07:46 PM and ended at 5:05:14 AM.  Sleep onset time was 61.2 minutes and the sleep efficiency was 61.1%%. The total sleep time was 218.2 minutes.  Stage REM latency was 159.0 minutes.  The patient spent 9.3%% of the night in stage N1 sleep, 83.4%% in stage N2 sleep, 0.0%% in stage N3 and 7.3% in REM.  Alpha intrusion was absent.  Supine sleep was 24.29%.  RESPIRATORY PARAMETERS The overall apnea/hypopnea index (AHI) was 33.0 per hour. There were 78 total apneas, including 78 obstructive, 0 central and 0 mixed apneas. There were 42 hypopneas and 5 RERAs.  The AHI during Stage REM sleep was 82.5 per hour.  AHI while supine was 38.5 per hour.  The mean oxygen saturation was 94.7%. The minimum SpO2 during sleep was 83.0%.  loud snoring was noted during this study.  CARDIAC DATA The 2 lead EKG demonstrated sinus rhythm. The mean heart rate was 71.6 beats per minute.  Other EKG findings include: None.  LEG MOVEMENT DATA The total PLMS were 0 with a resulting PLMS index of 0.0. Associated arousal with leg movement index was 0.0 .  IMPRESSIONS - Severe obstructive sleep apnea occurred during this study (AHI = 33.0/h). - No significant central sleep apnea occurred during this study (CAI = 0.0/h). - Oxygen desaturation was noted during this study (Min O2 = 83.0%). - The patient snored with loud snoring volume. - No cardiac abnormalities were noted during this study. - Clinically significant periodic limb movements did not occur during sleep. No significant associated arousals.  DIAGNOSIS - Obstructive Sleep Apnea (327.23 [G47.33 ICD-10])  RECOMMENDATIONS - Suggest CPAP titration sleep study or DME autopap. Other options would be based on clinical judgment. - Be careful with alcohol, sedatives and other CNS depressants that may worsen sleep apnea and disrupt normal sleep architecture. - Sleep hygiene should be reviewed to assess factors that may improve sleep quality. - Weight management and regular exercise should be initiated or continued if appropriate.  [Electronically signed] 12/27/2017 01:01 PM  Baird Lyons MD, [place], American Board of Sleep Medicine   NPI: [id number]                          [place], [place] [facility]  ELECTRONICALLY SIGNED ON:  12/27/2017, 12:59 PM [place] PH: [phone]   FX: [phone] [place]

## 2017-12-30 ENCOUNTER — Telehealth: Payer: Self-pay

## 2017-12-30 ENCOUNTER — Telehealth: Payer: Self-pay | Admitting: Internal Medicine

## 2017-12-30 NOTE — Telephone Encounter (Signed)
Contacted pt to go over sleep study results pt didn't answer lvm asking pt to give me a call at her earliest convenience  If pt calls back please give results: sleep study confirmed sleep apnea. She now needs to have the second part of her sleep study for titration on the CPAP machine. I have submitted the referral and they will contact her to schedule

## 2017-12-30 NOTE — Telephone Encounter (Signed)
Patient called back to get her results. Please follow up with patient. Patient would also like to get a paper copy of the results sent to her by the mail.

## 2017-12-31 NOTE — Telephone Encounter (Signed)
Returned pt call to go over sleep study results pt didn't answer lvm informing pt that I will mailing her results today

## 2018-01-01 NOTE — Telephone Encounter (Signed)
Pt called for her sleep study results, I read her the note left by her Nurse, she had no further questions or concerns and will follow up with the sleep center for the titration appt

## 2018-02-09 ENCOUNTER — Other Ambulatory Visit: Payer: Self-pay | Admitting: Internal Medicine

## 2018-03-15 DIAGNOSIS — Z6841 Body Mass Index (BMI) 40.0 and over, adult: Secondary | ICD-10-CM | POA: Diagnosis not present

## 2018-03-15 DIAGNOSIS — E1129 Type 2 diabetes mellitus with other diabetic kidney complication: Secondary | ICD-10-CM | POA: Diagnosis not present

## 2018-03-15 DIAGNOSIS — N183 Chronic kidney disease, stage 3 (moderate): Secondary | ICD-10-CM | POA: Diagnosis not present

## 2018-04-24 ENCOUNTER — Other Ambulatory Visit: Payer: Self-pay | Admitting: Internal Medicine

## 2018-04-24 DIAGNOSIS — J301 Allergic rhinitis due to pollen: Secondary | ICD-10-CM

## 2018-04-26 ENCOUNTER — Other Ambulatory Visit: Payer: Self-pay | Admitting: Internal Medicine

## 2018-04-26 DIAGNOSIS — R7309 Other abnormal glucose: Secondary | ICD-10-CM

## 2018-04-26 MED ORDER — GLUCOSE BLOOD VI STRP: 1.0000 | ORAL_STRIP | Freq: Two times a day (BID) | 0 refills | Status: DC

## 2018-04-29 ENCOUNTER — Emergency Department (HOSPITAL_COMMUNITY): Payer: Medicaid Other

## 2018-04-29 ENCOUNTER — Other Ambulatory Visit: Payer: Self-pay

## 2018-04-29 ENCOUNTER — Encounter (HOSPITAL_COMMUNITY): Payer: Self-pay

## 2018-04-29 ENCOUNTER — Emergency Department (HOSPITAL_COMMUNITY)
Admission: EM | Admit: 2018-04-29 | Discharge: 2018-04-29 | Disposition: A | Discharge status: Home / Self Care | Payer: Medicaid Other | Attending: Emergency Medicine | Admitting: Emergency Medicine

## 2018-04-29 DIAGNOSIS — R079 Chest pain, unspecified: Secondary | ICD-10-CM | POA: Diagnosis not present

## 2018-04-29 DIAGNOSIS — R0602 Shortness of breath: Secondary | ICD-10-CM | POA: Diagnosis not present

## 2018-04-29 DIAGNOSIS — Z7984 Long term (current) use of oral hypoglycemic drugs: Secondary | ICD-10-CM | POA: Insufficient documentation

## 2018-04-29 DIAGNOSIS — Z87891 Personal history of nicotine dependence: Secondary | ICD-10-CM | POA: Diagnosis not present

## 2018-04-29 DIAGNOSIS — J45909 Unspecified asthma, uncomplicated: Secondary | ICD-10-CM | POA: Insufficient documentation

## 2018-04-29 DIAGNOSIS — R739 Hyperglycemia, unspecified: Secondary | ICD-10-CM

## 2018-04-29 DIAGNOSIS — E1165 Type 2 diabetes mellitus with hyperglycemia: Secondary | ICD-10-CM | POA: Diagnosis not present

## 2018-04-29 DIAGNOSIS — J189 Pneumonia, unspecified organism: Secondary | ICD-10-CM | POA: Insufficient documentation

## 2018-04-29 LAB — URINALYSIS, ROUTINE W REFLEX MICROSCOPIC
Bacteria, UA: NONE SEEN
Bilirubin Urine: NEGATIVE
Glucose, UA: >=500 mg/dL — AB
Ketones, ur: 5 mg/dL — AB
Leukocytes,Ua: NEGATIVE
Nitrite: NEGATIVE
Protein, ur: NEGATIVE mg/dL
SPECIFIC GRAVITY, URINE: 1.027 (ref 1.005–1.030)
pH: 6 (ref 5.0–8.0)

## 2018-04-29 LAB — BASIC METABOLIC PANEL
Anion gap: 12 (ref 5–15)
BUN: 23 mg/dL — ABNORMAL HIGH (ref 6–20)
CALCIUM: 9.2 mg/dL (ref 8.9–10.3)
CO2: 18 mmol/L — ABNORMAL LOW (ref 22–32)
Chloride: 96 mmol/L — ABNORMAL LOW (ref 98–111)
Creatinine, Ser: 1.84 mg/dL — ABNORMAL HIGH (ref 0.44–1.00)
GFR, EST AFRICAN AMERICAN: 37 mL/min — AB (ref >60)
GFR, EST NON AFRICAN AMERICAN: 32 mL/min — AB (ref >60)
Glucose, Bld: 744 mg/dL (ref 70–99)
Potassium: 4 mmol/L (ref 3.5–5.1)
Sodium: 126 mmol/L — ABNORMAL LOW (ref 135–145)

## 2018-04-29 LAB — CBC
HCT: 43.1 % (ref 36.0–46.0)
Hemoglobin: 13.2 g/dL (ref 12.0–15.0)
MCH: 21.4 pg — ABNORMAL LOW (ref 26.0–34.0)
MCHC: 30.6 g/dL (ref 30.0–36.0)
MCV: 70 fL — ABNORMAL LOW (ref 80.0–100.0)
Platelets: 294 10*3/uL (ref 150–400)
RBC: 6.16 MIL/uL — ABNORMAL HIGH (ref 3.87–5.11)
RDW: 13.9 % (ref 11.5–15.5)
WBC: 6.7 10*3/uL (ref 4.0–10.5)
nRBC: 0 % (ref 0.0–0.2)

## 2018-04-29 LAB — CBG MONITORING, ED
Glucose-Capillary: >600 mg/dL (ref 70–99)
Glucose-Capillary: >600 mg/dL (ref 70–99)
Glucose-Capillary: 463 mg/dL — ABNORMAL HIGH (ref 70–99)
Glucose-Capillary: 409 mg/dL — ABNORMAL HIGH (ref 70–99)
Glucose-Capillary: 295 mg/dL — ABNORMAL HIGH (ref 70–99)

## 2018-04-29 LAB — I-STAT BETA HCG BLOOD, ED (MC, WL, AP ONLY): I-stat hCG, quantitative: <5 m[IU]/mL (ref <5)

## 2018-04-29 LAB — I-STAT TROPONIN, ED: TROPONIN I, POC: 0 ng/mL (ref 0.00–0.08)

## 2018-04-29 MED ORDER — SODIUM CHLORIDE 0.9 % IV SOLN
INTRAVENOUS | Status: DC
  Administered 2018-04-29: 12:00:00 via INTRAVENOUS

## 2018-04-29 MED ORDER — INSULIN REGULAR(HUMAN) IN NACL 100-0.9 UT/100ML-% IV SOLN
INTRAVENOUS | Status: DC
  Administered 2018-04-29: 4 [IU]/h via INTRAVENOUS
  Filled 2018-04-29: qty 100

## 2018-04-29 MED ORDER — DOXYCYCLINE HYCLATE 100 MG PO TABS
100.0000 mg | ORAL_TABLET | Freq: Once | ORAL | Status: AC
  Administered 2018-04-29: 100 mg via ORAL
  Filled 2018-04-29: qty 1

## 2018-04-29 MED ORDER — SODIUM CHLORIDE 0.9% FLUSH: 3.0000 mL | Freq: Once | INTRAVENOUS | Status: DC

## 2018-04-29 MED ORDER — POTASSIUM CHLORIDE CRYS ER 20 MEQ PO TBCR
40.0000 meq | EXTENDED_RELEASE_TABLET | Freq: Once | ORAL | Status: AC
  Administered 2018-04-29: 40 meq via ORAL
  Filled 2018-04-29: qty 2

## 2018-04-29 MED ORDER — INSULIN ASPART 100 UNIT/ML ~~LOC~~ SOLN
4.0000 [IU] | Freq: Once | SUBCUTANEOUS | Status: AC
  Administered 2018-04-29: 4 [IU] via SUBCUTANEOUS
  Filled 2018-04-29: qty 1

## 2018-04-29 MED ORDER — SODIUM CHLORIDE 0.9 % IV BOLUS
1000.0000 mL | Freq: Once | INTRAVENOUS | Status: AC
  Administered 2018-04-29: 1000 mL via INTRAVENOUS

## 2018-04-29 MED ORDER — DOXYCYCLINE HYCLATE 100 MG PO CAPS: 100.0000 mg | ORAL_CAPSULE | Freq: Two times a day (BID) | ORAL | 0 refills | Status: DC

## 2018-04-29 NOTE — Discharge Instructions (Addendum)
You were evaluated in the Emergency Department and after careful evaluation, we did not find any emergent condition requiring admission or further testing in the hospital.  Your symptoms today seem to be due to pneumonia.  Please take the antibiotics as directed and keep a close eye on your blood sugars.  Please return to the Emergency Department if you experience any worsening of your condition.  We encourage you to follow up with a primary care provider.  Thank you for allowing [place] to be a part of your care.

## 2018-04-29 NOTE — ED Triage Notes (Addendum)
Pt presents with c/o hyperglycemia. Pt reports she checked her CBG this morning and her monitor was reading high. She reports she has been battling this for the last couple of days. Pt also c/o chest pain for the past day.

## 2018-04-29 NOTE — ED Notes (Signed)
Glucose 744 Bero, MD made aware

## 2018-04-29 NOTE — ED Notes (Signed)
Insulin drip D/C per Sedonia Small MD

## 2018-04-29 NOTE — ED Provider Notes (Signed)
[facility] Emergency Department Provider Note MRN:  [id number]  Arrival date & time: 04/29/18     Chief Complaint   Hyperglycemia and Chest Pain   History of Present Illness   Rebecca Johnston is a 48 y.o. year-old female with a history of diabetes presenting to the ED with chief complaint of hyperglycemia and chest pain.  Patient explains that her symptoms began 1 week ago with cold-like symptoms.  Persistent cough, nasal congestion.  Began experiencing a "sinus headache".  Yesterday he recorded a glucose level of 500, this morning was undetectably high.  For the past 24 hours endorsing dull central chest pain, mild in severity, constant, no exacerbating relieving factors.  Denies dizziness, no diaphoresis, no nausea, no vomiting, no shortness of breath.  Review of Systems  A complete 10 system review of systems was obtained and all systems are negative except as noted in the HPI and PMH.   Patient's Health History    Past Medical History:  Diagnosis Date  . Anemia   . Asthma   . Diabetes mellitus without complication ([place]) 03/270  . Fibroids   . GERD (gastroesophageal reflux disease)   . History of blood transfusion Feb 2015  . Obesity, Class III, BMI 40-49.9 (morbid obesity) ([place])   . Shortness of breath dyspnea     Past Surgical History:  Procedure Laterality Date  . ABDOMINAL HYSTERECTOMY N/A 12/08/2014   Procedure: HYSTERECTOMY ABDOMINAL;  Surgeon: Woodroe Mode, MD;  Location: [facility];  Service: Gynecology;  Laterality: N/A;  . DILATION AND CURETTAGE OF UTERUS    . DILITATION & CURRETTAGE/HYSTROSCOPY WITH VERSAPOINT RESECTION N/A 01/06/2014   Procedure: Corena Pilgrim WITH VERSAPOINT;  Surgeon: Woodroe Mode, MD;  Location: [facility];  Service: Gynecology;  Laterality: N/A;  . LAPAROSCOPIC LYSIS OF ADHESIONS N/A 12/08/2014   Procedure: LAPAROSCOPIC LYSIS OF ADHESIONS;  Surgeon: Ahlivia Salahuddin Boston, MD;  [facility];  Service: General;  Laterality: N/A;  .  OVARIAN CYST REMOVAL     cyst not removed. Cyst drained  . SUPRA-UMBILICAL HERNIA N/A 53/08/6438   Procedure: LAPARSCOPIC SUPRA-UMBILICAL HERNIA REPAIR ;  Surgeon: Charniece Venturino Boston, MD;  [facility];  Service: General;  Laterality: N/A;  . VENTRAL HERNIA REPAIR N/A 12/08/2014   Procedure: LAPAROSCOPIC INCARCERATED INCISIONAL VENTRAL WALL HERNIA REPAIR;  Surgeon: Marrianne Sica Boston, MD;  [facility];  Service: General;  Laterality: N/A;    Family History  Problem Relation Age of Onset  . Hypertension Mother   . Diabetes Father   . Breast cancer Maternal Aunt   . Diabetes Maternal Aunt   . Diabetes Paternal Aunt   . Diabetes Maternal Aunt   . Diabetes Maternal Aunt     Social History   Socioeconomic History  . Marital status: Single    Spouse name: Not on file  . Number of children: Not on file  . Years of education: Not on file  . Highest education level: Not on file  Occupational History  . Not on file  Social Needs  . Financial resource strain: Not on file  . Food insecurity:    Worry: Not on file    Inability: Not on file  . Transportation needs:    Medical: Not on file    Non-medical: Not on file  Tobacco Use  . Smoking status: Former Smoker    Packs/day: 0.25    Years: 25.00    Pack years: 6.25    Types: Cigarettes    Last attempt to  quit: 08/02/2015    Years since quitting: 2.7  . Smokeless tobacco: Never Used  Substance and Sexual Activity  . Alcohol use: Yes    Alcohol/week: 0.0 standard drinks    Comment: socially  . Drug use: No  . Sexual activity: Not Currently    Birth control/protection: None  Lifestyle  . Physical activity:    Days per week: Not on file    Minutes per session: Not on file  . Stress: Not on file  Relationships  . Social connections:    Talks on phone: Not on file    Gets together: Not on file    Attends religious service: Not on file    Active member of club or organization: Not on file    Attends meetings of clubs or  organizations: Not on file    Relationship status: Not on file  . Intimate partner violence:    Fear of current or ex partner: Not on file    Emotionally abused: Not on file    Physically abused: Not on file    Forced sexual activity: Not on file  Other Topics Concern  . Not on file  Social History Narrative  . Not on file     Physical Exam  Vital Signs and Nursing Notes reviewed Vitals:   04/29/18 1203 04/29/18 1230  BP: 130/70 132/81  Pulse: 86 80  Resp: (!) 23 20  Temp:    SpO2: 100% 100%    CONSTITUTIONAL: Well-appearing, NAD NEURO:  Alert and oriented x 3, no focal deficits EYES:  eyes equal and reactive ENT/NECK:  no LAD, no JVD CARDIO: Regular rate, well-perfused, normal S1 and S2 PULM:  CTAB no wheezing or rhonchi GI/GU:  normal bowel sounds, non-distended, non-tender MSK/SPINE:  No gross deformities, no edema SKIN:  no rash, atraumatic PSYCH:  Appropriate speech and behavior  Diagnostic and Interventional Summary    EKG Interpretation  Date/Time:  Thursday April 29 2018 08:20:55 EST Ventricular Rate:  96 PR Interval:    QRS Duration: 82 QT Interval:  360 QTC Calculation: 455 R Axis:   63 Text Interpretation:  Sinus rhythm ST elev, probable normal early repol pattern Confirmed by Gerlene Fee [phone]) on 04/29/2018 9:21:37 AM      Labs Reviewed  BASIC METABOLIC PANEL - Abnormal; Notable for the following components:      Result Value   Sodium 126 (*)    Chloride 96 (*)    CO2 18 (*)    Glucose, Bld 744 (*)    BUN 23 (*)    Creatinine, Ser 1.84 (*)    GFR calc non Af Amer 32 (*)    GFR calc Af Amer 37 (*)    All other components within normal limits  CBC - Abnormal; Notable for the following components:   RBC 6.16 (*)    MCV 70.0 (*)    MCH 21.4 (*)    All other components within normal limits  URINALYSIS, ROUTINE W REFLEX MICROSCOPIC - Abnormal; Notable for the following components:   Color, Urine STRAW (*)    Glucose, UA >=500 (*)    Hgb  urine dipstick SMALL (*)    Ketones, ur 5 (*)    All other components within normal limits  CBG MONITORING, ED - Abnormal; Notable for the following components:   Glucose-Capillary >600 (*)    All other components within normal limits  CBG MONITORING, ED - Abnormal; Notable for the following components:   Glucose-Capillary >600 (*)  All other components within normal limits  CBG MONITORING, ED - Abnormal; Notable for the following components:   Glucose-Capillary 463 (*)    All other components within normal limits  CBG MONITORING, ED - Abnormal; Notable for the following components:   Glucose-Capillary 409 (*)    All other components within normal limits  CBG MONITORING, ED - Abnormal; Notable for the following components:   Glucose-Capillary 295 (*)    All other components within normal limits  I-STAT TROPONIN, ED  I-STAT BETA HCG BLOOD, ED ([facility], [facility], [facility] ONLY)    DG Chest 2 View  Final Result      Medications  sodium chloride flush (NS) 0.9 % injection 3 mL (3 mLs Intravenous Not Given 04/29/18 0834)  insulin regular, human (MYXREDLIN) 100 units/ 100 mL infusion ( Intravenous Stopped 04/29/18 1405)  0.9 %  sodium chloride infusion ( Intravenous Stopped 04/29/18 1405)  sodium chloride 0.9 % bolus 1,000 mL (0 mLs Intravenous Stopped 04/29/18 1319)  insulin aspart (novoLOG) injection 4 Units (4 Units Subcutaneous Given 04/29/18 0930)  potassium chloride SA (K-DUR,KLOR-CON) CR tablet 40 mEq (40 mEq Oral Given 04/29/18 1157)  doxycycline (VIBRA-TABS) tablet 100 mg (100 mg Oral Given 04/29/18 1156)     Procedures Critical Care  ED Course and Medical Decision Making  I have reviewed the triage vital signs and the nursing notes.  Pertinent labs & imaging results that were available during my care of the patient were reviewed by me and considered in my medical decision making (see below for details).  Atypical chest pain in this 48 year old female with history of diabetes, suspect  underlying viral process, also considering secondary bacterial pneumonia, UTI.  Work-up pending.  Labs reveal glucose greater than 700 with the expected hyponatremia, no DKA.  Chest x-ray read as lingular atelectasis, more clinically interpreted as pneumonia given the recent viral illness, continued cough, hyperglycemia.  Patient given IV insulin with glucose corrected to 295.  Feeling well, requesting discharge.  Prescription for doxycycline, patient advised to keep a close eye on her blood sugars.  We will follow-up with PCP.  After the discussed management above, the patient was determined to be safe for discharge.  The patient was in agreement with this plan and all questions regarding their care were answered.  ED return precautions were discussed and the patient will return to the ED with any significant worsening of condition.  Barth Kirks. Sedonia Small, [place] [email or web address]  Final Clinical Impressions(s) / ED Diagnoses     ICD-10-CM   1. Community acquired pneumonia of left lung, unspecified part of lung J18.9   2. Hyperglycemia R73.9     ED Discharge Orders         Ordered    doxycycline (VIBRAMYCIN) 100 MG capsule  2 times daily     04/29/18 1404             Maudie Flakes, MD 04/29/18 1407

## 2018-05-03 ENCOUNTER — Emergency Department (HOSPITAL_COMMUNITY)
Admission: EM | Admit: 2018-05-03 | Discharge: 2018-05-03 | Disposition: A | Discharge status: Home / Self Care | Payer: Medicaid Other | Attending: Emergency Medicine | Admitting: Emergency Medicine

## 2018-05-03 ENCOUNTER — Ambulatory Visit (HOSPITAL_COMMUNITY)
Admission: EM | Admit: 2018-05-03 | Discharge: 2018-05-03 | Disposition: A | Discharge status: Home / Self Care | Payer: Medicaid Other | Attending: Family Medicine | Admitting: Family Medicine

## 2018-05-03 ENCOUNTER — Emergency Department (HOSPITAL_COMMUNITY): Payer: Medicaid Other

## 2018-05-03 ENCOUNTER — Encounter (HOSPITAL_COMMUNITY): Payer: Self-pay | Admitting: Emergency Medicine

## 2018-05-03 ENCOUNTER — Other Ambulatory Visit: Payer: Self-pay

## 2018-05-03 DIAGNOSIS — N183 Chronic kidney disease, stage 3 (moderate): Secondary | ICD-10-CM | POA: Diagnosis not present

## 2018-05-03 DIAGNOSIS — E1122 Type 2 diabetes mellitus with diabetic chronic kidney disease: Secondary | ICD-10-CM | POA: Diagnosis not present

## 2018-05-03 DIAGNOSIS — E1165 Type 2 diabetes mellitus with hyperglycemia: Secondary | ICD-10-CM | POA: Diagnosis not present

## 2018-05-03 DIAGNOSIS — R51 Headache: Secondary | ICD-10-CM | POA: Diagnosis not present

## 2018-05-03 DIAGNOSIS — Z7984 Long term (current) use of oral hypoglycemic drugs: Secondary | ICD-10-CM | POA: Insufficient documentation

## 2018-05-03 DIAGNOSIS — Z79899 Other long term (current) drug therapy: Secondary | ICD-10-CM | POA: Insufficient documentation

## 2018-05-03 DIAGNOSIS — E860 Dehydration: Secondary | ICD-10-CM | POA: Diagnosis not present

## 2018-05-03 DIAGNOSIS — I129 Hypertensive chronic kidney disease with stage 1 through stage 4 chronic kidney disease, or unspecified chronic kidney disease: Secondary | ICD-10-CM | POA: Insufficient documentation

## 2018-05-03 DIAGNOSIS — Z87891 Personal history of nicotine dependence: Secondary | ICD-10-CM | POA: Insufficient documentation

## 2018-05-03 DIAGNOSIS — R739 Hyperglycemia, unspecified: Secondary | ICD-10-CM | POA: Diagnosis not present

## 2018-05-03 DIAGNOSIS — R519 Headache, unspecified: Secondary | ICD-10-CM

## 2018-05-03 LAB — CBC WITH DIFFERENTIAL/PLATELET
Abs Immature Granulocytes: 0.19 10*3/uL — ABNORMAL HIGH (ref 0.00–0.07)
Basophils Absolute: 0.1 10*3/uL (ref 0.0–0.1)
Basophils Relative: 1 %
Eosinophils Absolute: 0.2 10*3/uL (ref 0.0–0.5)
Eosinophils Relative: 2 %
HCT: 46.1 % — ABNORMAL HIGH (ref 36.0–46.0)
Hemoglobin: 14 g/dL (ref 12.0–15.0)
Immature Granulocytes: 2 %
Lymphocytes Relative: 17 %
Lymphs Abs: 1.6 10*3/uL (ref 0.7–4.0)
MCH: 21.3 pg — ABNORMAL LOW (ref 26.0–34.0)
MCHC: 30.4 g/dL (ref 30.0–36.0)
MCV: 70.1 fL — ABNORMAL LOW (ref 80.0–100.0)
Monocytes Absolute: 0.8 10*3/uL (ref 0.1–1.0)
Monocytes Relative: 9 %
Neutro Abs: 6.3 10*3/uL (ref 1.7–7.7)
Neutrophils Relative %: 69 %
Platelets: 341 10*3/uL (ref 150–400)
RBC: 6.58 MIL/uL — ABNORMAL HIGH (ref 3.87–5.11)
RDW: 14.2 % (ref 11.5–15.5)
WBC: 9.1 10*3/uL (ref 4.0–10.5)
nRBC: 0 % (ref 0.0–0.2)

## 2018-05-03 LAB — POCT URINALYSIS DIP (DEVICE)
GLUCOSE, UA: 500 mg/dL — AB
Ketones, ur: 15 mg/dL — AB
Leukocytes,Ua: NEGATIVE
NITRITE: NEGATIVE
Protein, ur: 100 mg/dL — AB
Specific Gravity, Urine: 1.025 (ref 1.005–1.030)
Urobilinogen, UA: 0.2 mg/dL (ref 0.0–1.0)
pH: 6 (ref 5.0–8.0)

## 2018-05-03 LAB — CBG MONITORING, ED: Glucose-Capillary: 291 mg/dL — ABNORMAL HIGH (ref 70–99)

## 2018-05-03 LAB — GLUCOSE, CAPILLARY: Glucose-Capillary: 370 mg/dL — ABNORMAL HIGH (ref 70–99)

## 2018-05-03 LAB — BASIC METABOLIC PANEL
Anion gap: 13 (ref 5–15)
BUN: 18 mg/dL (ref 6–20)
CO2: 16 mmol/L — ABNORMAL LOW (ref 22–32)
Calcium: 9.8 mg/dL (ref 8.9–10.3)
Chloride: 101 mmol/L (ref 98–111)
Creatinine, Ser: 1.71 mg/dL — ABNORMAL HIGH (ref 0.44–1.00)
GFR calc Af Amer: 40 mL/min — ABNORMAL LOW (ref >60)
GFR calc non Af Amer: 35 mL/min — ABNORMAL LOW (ref >60)
Glucose, Bld: 355 mg/dL — ABNORMAL HIGH (ref 70–99)
Potassium: 4.3 mmol/L (ref 3.5–5.1)
Sodium: 130 mmol/L — ABNORMAL LOW (ref 135–145)

## 2018-05-03 MED ORDER — INSULIN ASPART 100 UNIT/ML ~~LOC~~ SOLN: 8.0000 [IU] | Freq: Once | SUBCUTANEOUS | Status: DC

## 2018-05-03 MED ORDER — METOCLOPRAMIDE HCL 10 MG PO TABS
5.0000 mg | ORAL_TABLET | Freq: Once | ORAL | Status: AC
  Administered 2018-05-03: 5 mg via ORAL
  Filled 2018-05-03: qty 1

## 2018-05-03 MED ORDER — DIPHENHYDRAMINE HCL 25 MG PO CAPS
25.0000 mg | ORAL_CAPSULE | Freq: Once | ORAL | Status: AC
  Administered 2018-05-03: 25 mg via ORAL
  Filled 2018-05-03: qty 1

## 2018-05-03 MED ORDER — KETOROLAC TROMETHAMINE 15 MG/ML IJ SOLN
15.0000 mg | Freq: Once | INTRAMUSCULAR | Status: AC
  Administered 2018-05-03: 15 mg via INTRAVENOUS
  Filled 2018-05-03: qty 1

## 2018-05-03 MED ORDER — SODIUM CHLORIDE 0.9 % IV BOLUS
1000.0000 mL | Freq: Once | INTRAVENOUS | Status: AC
  Administered 2018-05-03: 1000 mL via INTRAVENOUS

## 2018-05-03 NOTE — Discharge Instructions (Signed)
Please follow up in emergency room

## 2018-05-03 NOTE — ED Provider Notes (Addendum)
[place]    CSN: [id number] Arrival date & time: 05/03/18  1027     History   Chief Complaint Chief Complaint  Patient presents with  . Cough    HPI Rebecca Johnston is a 48 y.o. female history of DM type II, CKD stage III, presenting today for evaluation of headache and elevated blood sugar.  Patient states that last Thursday she was seen at [facility] long, treated for elevated blood sugar with insulin and fluids as well as pneumonia with doxycycline.  She has been taking the doxycycline consistently over the past 4 days and has not had any improvement of her symptoms.  Her main complaint has been continued fatigue as well as a headache.  Headache is located on the right side.  Feels as if this may be related to sinus pressure.  Is complaining of floaters and spots on the left side.  She is also had some dizziness and upset stomach.  One episode of vomiting last night.  Has tried Excedrin occasionally for the headache.  Cough is minimal.  Last followed up with PCP for diabetes in September.  Has been checking her blood sugars since and states that they have been fluctuating between high 300s and 500s.  HPI  Past Medical History:  Diagnosis Date  . Anemia   . Asthma   . Diabetes mellitus without complication ([place]) 38/9373  . Fibroids   . GERD (gastroesophageal reflux disease)   . History of blood transfusion Feb 2015  . Obesity, Class III, BMI 40-49.9 (morbid obesity) ([place])   . Shortness of breath dyspnea     Patient Active Problem List   Diagnosis Date Noted  . CKD (chronic kidney disease), stage III ([place]) 04/16/2017  . Neuropathy 04/04/2016  . Hx of iron deficiency anemia 12/21/2015  . Seasonal allergic rhinitis 12/21/2015  . Type 2 diabetes mellitus without complications ([place]) 42/87/6811  . Incarcerated hernia s/p lap repair w mesh 12/08/2014 12/08/2014  . Esophageal reflux 10/21/2013  . Abnormal CT scan, chest 09/02/2013    Past Surgical History:  Procedure  Laterality Date  . ABDOMINAL HYSTERECTOMY N/A 12/08/2014   Procedure: HYSTERECTOMY ABDOMINAL;  Surgeon: Woodroe Mode, MD;  Location: [facility];  Service: Gynecology;  Laterality: N/A;  . DILATION AND CURETTAGE OF UTERUS    . DILITATION & CURRETTAGE/HYSTROSCOPY WITH VERSAPOINT RESECTION N/A 01/06/2014   Procedure: Corena Pilgrim WITH VERSAPOINT;  Surgeon: Woodroe Mode, MD;  Location: [place] [facility];  Service: Gynecology;  Laterality: N/A;  . LAPAROSCOPIC LYSIS OF ADHESIONS N/A 12/08/2014   Procedure: LAPAROSCOPIC LYSIS OF ADHESIONS;  Surgeon: Michael Boston, MD;  Location: [facility];  Service: General;  Laterality: N/A;  . OVARIAN CYST REMOVAL     cyst not removed. Cyst drained  . SUPRA-UMBILICAL HERNIA N/A 57/04/6201   Procedure: LAPARSCOPIC SUPRA-UMBILICAL HERNIA REPAIR ;  Surgeon: Michael Boston, MD;  Location: [facility];  Service: General;  Laterality: N/A;  . VENTRAL HERNIA REPAIR N/A 12/08/2014   Procedure: LAPAROSCOPIC INCARCERATED INCISIONAL VENTRAL WALL HERNIA REPAIR;  Surgeon: Michael Boston, MD;  Location: [facility];  Service: General;  Laterality: N/A;    OB History    Gravida  1   Para  0   Term  0   Preterm  0   AB  1   Living  0     SAB  0   TAB  0   Ectopic  1   Multiple  0   Live Births  Home Medications    Prior to Admission medications   Medication Sig Start Date End Date Taking? Authorizing Provider  ACCU-CHEK SOFTCLIX LANCETS lancets 1 each by Other route 2 (two) times daily. 12/27/15   Funches, Adriana Mccallum, MD  acetaminophen (TYLENOL) 500 MG tablet Take 1,000 mg by mouth every 6 (six) hours as needed for moderate pain.    [provider]  aspirin-acetaminophen-caffeine (EXCEDRIN MIGRAINE) [phone] MG tablet Take 2 tablets by mouth every 6 (six) hours as needed for headache.    [provider]  Blood Glucose Monitoring Suppl (ACCU-CHEK AVIVA PLUS) w/Device KIT 1 Device by Does not apply route 2 (two) times daily. 01/02/17   Tresa Garter,  MD  cetirizine (ZYRTEC) 10 MG tablet Take 1 tablet (10 mg total) by mouth daily. Patient taking differently: Take 10 mg by mouth daily as needed for allergies.  04/16/17   Ladell Pier, MD  doxycycline (VIBRAMYCIN) 100 MG capsule Take 1 capsule (100 mg total) by mouth 2 (two) times daily. 04/29/18   Maudie Flakes, MD  fluticasone (FLONASE) 50 MCG/ACT nasal spray USE 2 SPRAY(S) IN EACH NOSTRIL ONCE DAILY Patient taking differently: Place 2 sprays into both nostrils daily.  04/26/18   Ladell Pier, MD  gabapentin (NEURONTIN) 100 MG capsule Take 100 mg by mouth 1-3 times daily as needed for nerve pains Patient taking differently: Take 100 mg by mouth 3 (three) times daily as needed (nerve pain).  04/04/16   Funches, Adriana Mccallum, MD  glimepiride (AMARYL) 2 MG tablet Take 1 tablet (2 mg total) by mouth daily before breakfast. MUST MAKE APPT FOR FURTHER REFILLS 04/26/18   Ladell Pier, MD  glucose blood (ACCU-CHEK AVIVA PLUS) test strip 1 each by Other route 2 (two) times daily. MUST MAKE APPT FOR FURTHER REFILLS 04/26/18   Ladell Pier, MD  mupirocin ointment (BACTROBAN) 2 % Apply 1 application topically 2 (two) times daily. Patient not taking: Reported on 04/29/2018 04/22/17   Ladell Pier, MD  [facility] HFA 108 [phone] Base) MCG/ACT inhaler Inhale 2 puffs into the lungs every 6 (six) hours as needed for wheezing or shortness of breath. 04/17/17   Ladell Pier, MD  ranitidine (ZANTAC) 300 MG tablet 1 tab PO QHS PRN Patient taking differently: Take 300 mg by mouth at bedtime as needed for heartburn.  04/16/17   Ladell Pier, MD    Family History Family History  Problem Relation Age of Onset  . Hypertension Mother   . Diabetes Father   . Breast cancer Maternal Aunt   . Diabetes Maternal Aunt   . Diabetes Paternal Aunt   . Diabetes Maternal Aunt   . Diabetes Maternal Aunt     Social History Social History   Tobacco Use  . Smoking status: Former Smoker    Packs/day:  0.25    Years: 25.00    Pack years: 6.25    Types: Cigarettes    Last attempt to quit: 08/02/2015    Years since quitting: 2.7  . Smokeless tobacco: Never Used  Substance Use Topics  . Alcohol use: Yes    Alcohol/week: 0.0 standard drinks    Comment: socially  . Drug use: No     Allergies   Patient has no known allergies.   Review of Systems Review of Systems  Constitutional: Positive for fatigue. Negative for activity change, appetite change, chills and fever.  HENT: Positive for congestion and rhinorrhea. Negative for ear pain, sinus pressure, sore throat and  trouble swallowing.   Eyes: Negative for photophobia, pain, discharge, redness and visual disturbance.  Respiratory: Positive for cough. Negative for chest tightness and shortness of breath.   Cardiovascular: Negative for chest pain.  Gastrointestinal: Positive for nausea. Negative for abdominal pain, diarrhea and vomiting.  Genitourinary: Negative for decreased urine volume and hematuria.  Musculoskeletal: Negative for myalgias, neck pain and neck stiffness.  Skin: Negative for rash.  Neurological: Positive for dizziness and headaches. Negative for syncope, facial asymmetry, speech difficulty, weakness, light-headedness and numbness.     Physical Exam Triage Vital Signs ED Triage Vitals  Enc Vitals Group     BP 05/03/18 1041 113/81     Pulse Rate 05/03/18 1041 (!) 106     Resp 05/03/18 1041 18     Temp 05/03/18 1041 98.4 F (36.9 C)     Temp Source 05/03/18 1041 Oral     SpO2 05/03/18 1041 97 %     Weight --      Height --      Head Circumference --      Peak Flow --      Pain Score 05/03/18 1042 7     Pain Loc --      Pain Edu? --      Excl. in [place]? --    No data found.  Updated Vital Signs BP 113/81 (BP Location: Right Arm)   Pulse (!) 106   Temp 98.4 F (36.9 C) (Oral)   Resp 18   LMP 11/26/2014 (Exact Date)   SpO2 97%   Visual Acuity Right Eye Distance:   Left Eye Distance:   Bilateral  Distance:    Right Eye Near:   Left Eye Near:    Bilateral Near:     Physical Exam Vitals signs and nursing note reviewed.  Constitutional:      Appearance: She is well-developed.     Comments: Appears uncomfortable, holding head  HENT:     Head: Normocephalic and atraumatic.     Mouth/Throat:     Comments: Oral mucosa pink and slightly dry, no tonsillar enlargement or exudate. Posterior pharynx patent and nonerythematous, no uvula deviation or swelling. Normal phonation. Eyes:     Extraocular Movements: Extraocular movements intact.     Conjunctiva/sclera: Conjunctivae normal.     Pupils: Pupils are equal, round, and reactive to light.  Neck:     Musculoskeletal: Neck supple.  Cardiovascular:     Rate and Rhythm: Regular rhythm. Tachycardia present.     Heart sounds: No murmur.  Pulmonary:     Effort: Pulmonary effort is normal. No respiratory distress.     Breath sounds: Normal breath sounds.     Comments: Breathing comfortably at rest, CTABL, no wheezing, rales or other adventitious sounds auscultated Abdominal:     Palpations: Abdomen is soft.     Tenderness: There is no abdominal tenderness.  Skin:    General: Skin is warm and dry.  Neurological:     General: No focal deficit present.     Mental Status: She is alert and oriented to person, place, and time. Mental status is at baseline.     Cranial Nerves: No cranial nerve deficit.      UC Treatments / Results  Labs (all labs ordered are listed, but only abnormal results are displayed) Labs Reviewed  GLUCOSE, CAPILLARY - Abnormal; Notable for the following components:      Result Value   Glucose-Capillary 370 (*)    All other components within normal  limits  POCT URINALYSIS DIP (DEVICE) - Abnormal; Notable for the following components:   Glucose, UA 500 (*)    Bilirubin Urine SMALL (*)    Ketones, ur 15 (*)    Hgb urine dipstick TRACE (*)    Protein, ur 100 (*)    All other components within normal limits    CBG MONITORING, ED    EKG None  Radiology No results found.  Procedures Procedures (including critical care time)  Medications Ordered in UC Medications - No data to display  Initial Impression / Assessment and Plan / UC Course  I have reviewed the triage vital signs and the nursing notes.  Pertinent labs & imaging results that were available during my care of the patient were reviewed by me and considered in my medical decision making (see chart for details).    Discussed case with Dr. Mannie Stabile. 48 year old female, blood sugar in clinic 370, 15 ketones.  Urine suggestive of dehydration.  Based off blood sugar, likely not in DKA.  Headache possibly related to dehydration; has been trying oral rehydration at home without improvement.  May benefit from fluids given dehydration, recommending further evaluation and treatment in ED.Discussed strict return precautions. Patient verbalized understanding and is agreeable with plan.  Discharged via wheelchair with nurse to ED. Final Clinical Impressions(s) / UC Diagnoses   Final diagnoses:  Acute nonintractable headache, unspecified headache type  Elevated blood sugar  Dehydration     Discharge Instructions     Please follow up in emergency room    ED Prescriptions    None     Controlled Substance Prescriptions [place] Controlled Substance Registry consulted? Not Applicable   Janith Lima, PA-C 05/03/18 [address] [place], [place] 05/03/18 1231

## 2018-05-03 NOTE — ED Notes (Signed)
Patient verbalized understanding of discharge instructions and denies any further needs or questions at this time. VS stable. Patient ambulatory with steady gait. Escorted to ED entrance.

## 2018-05-03 NOTE — ED Triage Notes (Signed)
Pt sts recently diagnosed with PNA; pt sts taking meds but not feeling better; pt sts HA and still having elevated CBG readings

## 2018-05-03 NOTE — Discharge Instructions (Addendum)
Please read the attached information.  Please continue monitoring your blood sugar at home, advise you to consult your primary care provider today and schedule immediate follow-up evaluation as your blood sugar continues to fluctuate dramatically.  If you develop any new or worsening signs or symptoms return medially to the emergency room.

## 2018-05-03 NOTE — ED Triage Notes (Signed)
Patient present POV with c./o headache. Recent treatment for PNA, currently taking doxycycline day 4/10. Denies shortness of breath or chest pain  Headache present for 2 weeks. Reports dizziness with standing., reports diplopia. No loss of extremity sensation, no facial droop, no dysarthria. Jake Bathe, MEd, RN CEN 05/03/2018 12:39 PM

## 2018-05-03 NOTE — ED Notes (Signed)
Patient transported to CT 

## 2018-05-03 NOTE — ED Provider Notes (Addendum)
[facility] EMERGENCY DEPARTMENT Provider Note   CSN: [id number] Arrival date & time: 05/03/18  1227   History   Chief Complaint Chief Complaint  Patient presents with  . Headache    HPI Rebecca Johnston is a 48 y.o. female.     HPI   48 year old female presents today with complaints of headache.  Patient notes 2 weeks of constant throbbing headache on the right.  She denies any radiation of symptoms, denies any trauma, fever, neck stiffness, or any neurological deficits.  She notes the symptoms improved with Excedrin but then returned.  Patient denies any significant history of migraines, notes this feels different than previous headaches.  Also notes that they are having difficulty controlling her blood sugar and it has been elevated recently.  She notes that she feels lightheaded when standing.  Nursing note reports diplopia, patient denies any double vision.  She notes she has been eating and drinking appropriately, notes some sweats.  She is currently taking antibiotics for recent pneumonia.  She denies any productive cough or shortness of breath.    Past Medical History:  Diagnosis Date  . Anemia   . Asthma   . Diabetes mellitus without complication ([place]) 93/8182  . Fibroids   . GERD (gastroesophageal reflux disease)   . History of blood transfusion Feb 2015  . Obesity, Class III, BMI 40-49.9 (morbid obesity) ([place])   . Shortness of breath dyspnea     Patient Active Problem List   Diagnosis Date Noted  . CKD (chronic kidney disease), stage III ([place]) 04/16/2017  . Neuropathy 04/04/2016  . Hx of iron deficiency anemia 12/21/2015  . Seasonal allergic rhinitis 12/21/2015  . Type 2 diabetes mellitus without complications ([place]) 99/37/1696  . Incarcerated hernia s/p lap repair w mesh 12/08/2014 12/08/2014  . Esophageal reflux 10/21/2013  . Abnormal CT scan, chest 09/02/2013    Past Surgical History:  Procedure Laterality Date  . ABDOMINAL HYSTERECTOMY  N/A 12/08/2014   Procedure: HYSTERECTOMY ABDOMINAL;  Surgeon: Woodroe Mode, MD;  Location: [facility];  Service: Gynecology;  Laterality: N/A;  . DILATION AND CURETTAGE OF UTERUS    . DILITATION & CURRETTAGE/HYSTROSCOPY WITH VERSAPOINT RESECTION N/A 01/06/2014   Procedure: Corena Pilgrim WITH VERSAPOINT;  Surgeon: Woodroe Mode, MD;  Location: [facility];  Service: Gynecology;  Laterality: N/A;  . LAPAROSCOPIC LYSIS OF ADHESIONS N/A 12/08/2014   Procedure: LAPAROSCOPIC LYSIS OF ADHESIONS;  Surgeon: Michael Boston, MD;  Location: [facility];  Service: General;  Laterality: N/A;  . OVARIAN CYST REMOVAL     cyst not removed. Cyst drained  . SUPRA-UMBILICAL HERNIA N/A 78/11/3808   Procedure: LAPARSCOPIC SUPRA-UMBILICAL HERNIA REPAIR ;  Surgeon: Michael Boston, MD;  [facility];  Service: General;  Laterality: N/A;  . VENTRAL HERNIA REPAIR N/A 12/08/2014   Procedure: LAPAROSCOPIC INCARCERATED INCISIONAL VENTRAL WALL HERNIA REPAIR;  Surgeon: Michael Boston, MD;  Location[facility];  Service: General;  Laterality: N/A;     OB History    Gravida  1   Para  0   Term  0   Preterm  0   AB  1   Living  0     SAB  0   TAB  0   Ectopic  1   Multiple  0   Live Births               Home Medications    Prior to Admission medications   Medication Sig Start Date End Date Taking?  Authorizing Provider  ACCU-CHEK SOFTCLIX LANCETS lancets 1 each by Other route 2 (two) times daily. 12/27/15   Funches, Adriana Mccallum, MD  acetaminophen (TYLENOL) 500 MG tablet Take 1,000 mg by mouth every 6 (six) hours as needed for moderate pain.    [provider]  aspirin-acetaminophen-caffeine (EXCEDRIN MIGRAINE) [phone] MG tablet Take 2 tablets by mouth every 6 (six) hours as needed for headache.    [provider]  Blood Glucose Monitoring Suppl (ACCU-CHEK AVIVA PLUS) w/Device KIT 1 Device by Does not apply route 2 (two) times daily. 01/02/17   Tresa Garter, MD  cetirizine (ZYRTEC) 10 MG tablet Take  1 tablet (10 mg total) by mouth daily. Patient taking differently: Take 10 mg by mouth daily as needed for allergies.  04/16/17   Ladell Pier, MD  doxycycline (VIBRAMYCIN) 100 MG capsule Take 1 capsule (100 mg total) by mouth 2 (two) times daily. 04/29/18   Maudie Flakes, MD  fluticasone (FLONASE) 50 MCG/ACT nasal spray USE 2 SPRAY(S) IN EACH NOSTRIL ONCE DAILY Patient taking differently: Place 2 sprays into both nostrils daily.  04/26/18   Ladell Pier, MD  gabapentin (NEURONTIN) 100 MG capsule Take 100 mg by mouth 1-3 times daily as needed for nerve pains Patient taking differently: Take 100 mg by mouth 3 (three) times daily as needed (nerve pain).  04/04/16   Funches, Adriana Mccallum, MD  glimepiride (AMARYL) 2 MG tablet Take 1 tablet (2 mg total) by mouth daily before breakfast. MUST MAKE APPT FOR FURTHER REFILLS 04/26/18   Ladell Pier, MD  glucose blood (ACCU-CHEK AVIVA PLUS) test strip 1 each by Other route 2 (two) times daily. MUST MAKE APPT FOR FURTHER REFILLS 04/26/18   Ladell Pier, MD  mupirocin ointment (BACTROBAN) 2 % Apply 1 application topically 2 (two) times daily. Patient not taking: Reported on 04/29/2018 04/22/17   Ladell Pier, MD  [facility] HFA 108 [phone] Base) MCG/ACT inhaler Inhale 2 puffs into the lungs every 6 (six) hours as needed for wheezing or shortness of breath. 04/17/17   Ladell Pier, MD  ranitidine (ZANTAC) 300 MG tablet 1 tab PO QHS PRN Patient taking differently: Take 300 mg by mouth at bedtime as needed for heartburn.  04/16/17   Ladell Pier, MD    Family History Family History  Problem Relation Age of Onset  . Hypertension Mother   . Diabetes Father   . Breast cancer Maternal Aunt   . Diabetes Maternal Aunt   . Diabetes Paternal Aunt   . Diabetes Maternal Aunt   . Diabetes Maternal Aunt     Social History Social History   Tobacco Use  . Smoking status: Former Smoker    Packs/day: 0.25    Years: 25.00    Pack years: 6.25     Types: Cigarettes    Last attempt to quit: 08/02/2015    Years since quitting: 2.7  . Smokeless tobacco: Never Used  Substance Use Topics  . Alcohol use: Yes    Alcohol/week: 0.0 standard drinks    Comment: socially  . Drug use: No     Allergies   Patient has no known allergies.   Review of Systems Review of Systems  All other systems reviewed and are negative.    Physical Exam Updated Vital Signs BP 137/90 (BP Location: Right Arm)   Pulse 100   Temp 98.7 F (37.1 C) (Oral)   Resp 18   Ht '5\' 5"'  (1.651 m)   Wt  122.5 kg   LMP 11/26/2014 (Exact Date)   SpO2 99%   BMI 44.93 kg/m   Physical Exam Vitals signs and nursing note reviewed.  Constitutional:      General: She is not in acute distress.    Appearance: She is well-developed. She is not diaphoretic.  HENT:     Head: Normocephalic and atraumatic.  Eyes:     General: No scleral icterus.       Right eye: No discharge.        Left eye: No discharge.     Conjunctiva/sclera: Conjunctivae normal.     Pupils: Pupils are equal, round, and reactive to light.  Neck:     Musculoskeletal: Normal range of motion and neck supple.     Vascular: No JVD.     Trachea: No tracheal deviation.  Pulmonary:     Effort: Pulmonary effort is normal.     Breath sounds: No stridor.  Musculoskeletal: Normal range of motion.        General: No tenderness.  Lymphadenopathy:     Cervical: No cervical adenopathy.  Neurological:     Mental Status: She is alert and oriented to person, place, and time.     GCS: GCS eye subscore is 4. GCS verbal subscore is 5. GCS motor subscore is 6.     Cranial Nerves: No cranial nerve deficit or facial asymmetry.     Sensory: No sensory deficit.     Motor: No tremor, atrophy, abnormal muscle tone or seizure activity.     Coordination: Coordination normal.     Gait: Gait normal.     Deep Tendon Reflexes:     Reflex Scores:      Patellar reflexes are 2+ on the right side and 2+ on the left  side. Psychiatric:        Behavior: Behavior normal.        Thought Content: Thought content normal.        Judgment: Judgment normal.      ED Treatments / Results  Labs (all labs ordered are listed, but only abnormal results are displayed) Labs Reviewed  CBC WITH DIFFERENTIAL/PLATELET - Abnormal; Notable for the following components:      Result Value   RBC 6.58 (*)    HCT 46.1 (*)    MCV 70.1 (*)    MCH 21.3 (*)    Abs Immature Granulocytes 0.19 (*)    All other components within normal limits  BASIC METABOLIC PANEL - Abnormal; Notable for the following components:   Sodium 130 (*)    CO2 16 (*)    Glucose, Bld 355 (*)    Creatinine, Ser 1.71 (*)    GFR calc non Af Amer 35 (*)    GFR calc Af Amer 40 (*)    All other components within normal limits  CBG MONITORING, ED - Abnormal; Notable for the following components:   Glucose-Capillary 291 (*)    All other components within normal limits    EKG None  Radiology Ct Head Wo Contrast  Result Date: 05/03/2018 CLINICAL DATA:  Pneumonia, headache EXAM: CT HEAD WITHOUT CONTRAST TECHNIQUE: Contiguous axial images were obtained from the base of the skull through the vertex without intravenous contrast. COMPARISON:  None. FINDINGS: Brain: No evidence of acute infarction, hemorrhage, hydrocephalus, extra-axial collection or mass lesion/mass effect. Vascular: No hyperdense vessel or unexpected calcification. Skull: Normal. Negative for fracture or focal lesion. Sinuses/Orbits: No acute finding. Other: None. IMPRESSION: No acute intracranial pathology. No non-contrast  CT findings to explain headache. Electronically Signed   By: Eddie Candle M.D.   On: 05/03/2018 13:33    Procedures Procedures (including critical care time)  Medications Ordered in ED Medications  sodium chloride 0.9 % bolus 1,000 mL (1,000 mLs Intravenous New Bag/Given 05/03/18 1312)  ketorolac (TORADOL) 15 MG/ML injection 15 mg (15 mg Intravenous Given 05/03/18 1312)   metoCLOPramide (REGLAN) tablet 5 mg (5 mg Oral Given 05/03/18 1301)  diphenhydrAMINE (BENADRYL) capsule 25 mg (25 mg Oral Given 05/03/18 1301)     Initial Impression / Assessment and Plan / ED Course  I have reviewed the triage vital signs and the nursing notes.  Pertinent labs & imaging results that were available during my care of the patient were reviewed by me and considered in my medical decision making (see chart for details).  Clinical Course as of May 03 1442  Mon May 03, 2018  1338 CT Head Wo Contrast [MS]    Clinical Course User Index [MS] Sharin Grave, Student-PA       Labs: CBC, BMP  Imaging: CT head wo  Consults:  Therapeutics: Toradol, Reglan, Benadryl  Discharge Meds:   Assessment/Plan: 48 year old female presents today with complaints of headache.  Patient has a negative head CT here, have low suspicion for stroke or any other acute life-threatening cranial abnormality.  Patient has no signs of meningitis.  Patient also significantly hyperglycemic here, she was given fluids which improved her symptoms.  She has had fluctuating blood sugar levels, she is encouraged to follow-up as an outpatient with her primary care provider within the next 24 hours for reassessment and ongoing management of her blood sugar and headache..  She is given strict return precautions, she verbalized understanding and agreement to today's plan had no further questions or concerns.   Final Clinical Impressions(s) / ED Diagnoses   Final diagnoses:  Acute nonintractable headache, unspecified headache type  Hyperglycemia    ED Discharge Orders    None       Francee Gentile 05/03/18 1444    Jamaiya Tunnell, Dellis Filbert, PA-C 05/03/18 1445    Carmin Muskrat, MD 05/04/18 2034

## 2018-05-04 ENCOUNTER — Inpatient Hospital Stay: Payer: Medicaid Other | Admitting: Critical Care Medicine

## 2018-05-04 NOTE — Progress Notes (Deleted)
   Subjective:    Patient ID: Rebecca Johnston, female    DOB: [birth year], 48 y.o.   MRN: [id number]  48 y.o.F  In ED 3x in one week, twice yesterday for DM2 out of control and headache/CAP     Review of Systems     Objective:   Physical Exam        Assessment & Plan:

## 2018-05-06 ENCOUNTER — Inpatient Hospital Stay (HOSPITAL_COMMUNITY)
Admission: EM | Admit: 2018-05-06 | Discharge: 2018-05-14 | DRG: 100 | Disposition: A | Discharge status: Rehabilitation Facility | Payer: Medicaid Other | Attending: Internal Medicine | Admitting: Internal Medicine

## 2018-05-06 ENCOUNTER — Other Ambulatory Visit: Payer: Self-pay

## 2018-05-06 ENCOUNTER — Encounter (HOSPITAL_COMMUNITY): Payer: Self-pay | Admitting: *Deleted

## 2018-05-06 ENCOUNTER — Emergency Department (HOSPITAL_COMMUNITY): Payer: Medicaid Other

## 2018-05-06 DIAGNOSIS — G111 Early-onset cerebellar ataxia: Secondary | ICD-10-CM | POA: Diagnosis not present

## 2018-05-06 DIAGNOSIS — E1165 Type 2 diabetes mellitus with hyperglycemia: Secondary | ICD-10-CM

## 2018-05-06 DIAGNOSIS — K219 Gastro-esophageal reflux disease without esophagitis: Secondary | ICD-10-CM | POA: Diagnosis present

## 2018-05-06 DIAGNOSIS — H532 Diplopia: Secondary | ICD-10-CM | POA: Diagnosis present

## 2018-05-06 DIAGNOSIS — Z79899 Other long term (current) drug therapy: Secondary | ICD-10-CM

## 2018-05-06 DIAGNOSIS — Z8249 Family history of ischemic heart disease and other diseases of the circulatory system: Secondary | ICD-10-CM

## 2018-05-06 DIAGNOSIS — N183 Chronic kidney disease, stage 3 unspecified: Secondary | ICD-10-CM | POA: Diagnosis present

## 2018-05-06 DIAGNOSIS — R4781 Slurred speech: Secondary | ICD-10-CM | POA: Diagnosis present

## 2018-05-06 DIAGNOSIS — T423X5A Adverse effect of barbiturates, initial encounter: Secondary | ICD-10-CM | POA: Diagnosis not present

## 2018-05-06 DIAGNOSIS — Z87891 Personal history of nicotine dependence: Secondary | ICD-10-CM

## 2018-05-06 DIAGNOSIS — R51 Headache: Secondary | ICD-10-CM | POA: Diagnosis not present

## 2018-05-06 DIAGNOSIS — E1142 Type 2 diabetes mellitus with diabetic polyneuropathy: Secondary | ICD-10-CM | POA: Diagnosis present

## 2018-05-06 DIAGNOSIS — Z881 Allergy status to other antibiotic agents status: Secondary | ICD-10-CM

## 2018-05-06 DIAGNOSIS — G40901 Epilepsy, unspecified, not intractable, with status epilepticus: Secondary | ICD-10-CM

## 2018-05-06 DIAGNOSIS — D509 Iron deficiency anemia, unspecified: Secondary | ICD-10-CM | POA: Diagnosis present

## 2018-05-06 DIAGNOSIS — T41295A Adverse effect of other general anesthetics, initial encounter: Secondary | ICD-10-CM | POA: Diagnosis not present

## 2018-05-06 DIAGNOSIS — Z7984 Long term (current) use of oral hypoglycemic drugs: Secondary | ICD-10-CM

## 2018-05-06 DIAGNOSIS — J9601 Acute respiratory failure with hypoxia: Secondary | ICD-10-CM | POA: Diagnosis not present

## 2018-05-06 DIAGNOSIS — E871 Hypo-osmolality and hyponatremia: Secondary | ICD-10-CM | POA: Diagnosis present

## 2018-05-06 DIAGNOSIS — I472 Ventricular tachycardia: Secondary | ICD-10-CM | POA: Diagnosis not present

## 2018-05-06 DIAGNOSIS — Z833 Family history of diabetes mellitus: Secondary | ICD-10-CM

## 2018-05-06 DIAGNOSIS — Y92230 Patient room in hospital as the place of occurrence of the external cause: Secondary | ICD-10-CM | POA: Diagnosis not present

## 2018-05-06 DIAGNOSIS — Z7951 Long term (current) use of inhaled steroids: Secondary | ICD-10-CM

## 2018-05-06 DIAGNOSIS — E878 Other disorders of electrolyte and fluid balance, not elsewhere classified: Secondary | ICD-10-CM | POA: Diagnosis not present

## 2018-05-06 DIAGNOSIS — I441 Atrioventricular block, second degree: Secondary | ICD-10-CM | POA: Diagnosis not present

## 2018-05-06 DIAGNOSIS — J181 Lobar pneumonia, unspecified organism: Secondary | ICD-10-CM | POA: Diagnosis present

## 2018-05-06 DIAGNOSIS — G8384 Todd's paralysis (postepileptic): Secondary | ICD-10-CM | POA: Diagnosis present

## 2018-05-06 DIAGNOSIS — G4700 Insomnia, unspecified: Secondary | ICD-10-CM | POA: Diagnosis present

## 2018-05-06 DIAGNOSIS — H518 Other specified disorders of binocular movement: Secondary | ICD-10-CM | POA: Diagnosis present

## 2018-05-06 DIAGNOSIS — R739 Hyperglycemia, unspecified: Secondary | ICD-10-CM | POA: Diagnosis not present

## 2018-05-06 DIAGNOSIS — R1319 Other dysphagia: Secondary | ICD-10-CM | POA: Diagnosis not present

## 2018-05-06 DIAGNOSIS — E1129 Type 2 diabetes mellitus with other diabetic kidney complication: Secondary | ICD-10-CM | POA: Diagnosis present

## 2018-05-06 DIAGNOSIS — G92 Toxic encephalopathy: Secondary | ICD-10-CM | POA: Diagnosis not present

## 2018-05-06 DIAGNOSIS — G2409 Other drug induced dystonia: Secondary | ICD-10-CM | POA: Diagnosis present

## 2018-05-06 DIAGNOSIS — R519 Headache, unspecified: Secondary | ICD-10-CM | POA: Diagnosis present

## 2018-05-06 DIAGNOSIS — E11649 Type 2 diabetes mellitus with hypoglycemia without coma: Secondary | ICD-10-CM | POA: Diagnosis not present

## 2018-05-06 DIAGNOSIS — J45909 Unspecified asthma, uncomplicated: Secondary | ICD-10-CM | POA: Diagnosis present

## 2018-05-06 DIAGNOSIS — I952 Hypotension due to drugs: Secondary | ICD-10-CM | POA: Diagnosis not present

## 2018-05-06 DIAGNOSIS — E872 Acidosis: Secondary | ICD-10-CM | POA: Diagnosis present

## 2018-05-06 DIAGNOSIS — R29810 Facial weakness: Secondary | ICD-10-CM | POA: Diagnosis present

## 2018-05-06 DIAGNOSIS — G40201 Localization-related (focal) (partial) symptomatic epilepsy and epileptic syndromes with complex partial seizures, not intractable, with status epilepticus: Principal | ICD-10-CM | POA: Diagnosis present

## 2018-05-06 DIAGNOSIS — R05 Cough: Secondary | ICD-10-CM | POA: Diagnosis not present

## 2018-05-06 DIAGNOSIS — Z452 Encounter for adjustment and management of vascular access device: Secondary | ICD-10-CM

## 2018-05-06 DIAGNOSIS — E1122 Type 2 diabetes mellitus with diabetic chronic kidney disease: Secondary | ICD-10-CM | POA: Diagnosis present

## 2018-05-06 DIAGNOSIS — Z01818 Encounter for other preprocedural examination: Secondary | ICD-10-CM

## 2018-05-06 DIAGNOSIS — Z6841 Body Mass Index (BMI) 40.0 and over, adult: Secondary | ICD-10-CM

## 2018-05-06 DIAGNOSIS — J9600 Acute respiratory failure, unspecified whether with hypoxia or hypercapnia: Secondary | ICD-10-CM

## 2018-05-06 LAB — URINALYSIS, ROUTINE W REFLEX MICROSCOPIC
Bilirubin Urine: NEGATIVE
Glucose, UA: >=500 mg/dL — AB
Ketones, ur: 5 mg/dL — AB
Leukocytes,Ua: NEGATIVE
Nitrite: NEGATIVE
Protein, ur: NEGATIVE mg/dL
Specific Gravity, Urine: 1.007 (ref 1.005–1.030)
pH: 6 (ref 5.0–8.0)

## 2018-05-06 LAB — CBC WITH DIFFERENTIAL/PLATELET
Abs Immature Granulocytes: 0.3 10*3/uL — ABNORMAL HIGH (ref 0.00–0.07)
Basophils Absolute: 0 10*3/uL (ref 0.0–0.1)
Basophils Relative: 0 %
Eosinophils Absolute: 0 10*3/uL (ref 0.0–0.5)
Eosinophils Relative: 0 %
HCT: 42.6 % (ref 36.0–46.0)
Hemoglobin: 12.8 g/dL (ref 12.0–15.0)
Immature Granulocytes: 3 %
Lymphocytes Relative: 12 %
Lymphs Abs: 1.4 10*3/uL (ref 0.7–4.0)
MCH: 21.2 pg — ABNORMAL LOW (ref 26.0–34.0)
MCHC: 30 g/dL (ref 30.0–36.0)
MCV: 70.6 fL — ABNORMAL LOW (ref 80.0–100.0)
Monocytes Absolute: 0.7 10*3/uL (ref 0.1–1.0)
Monocytes Relative: 6 %
Neutro Abs: 9.4 10*3/uL — ABNORMAL HIGH (ref 1.7–7.7)
Neutrophils Relative %: 79 %
Platelets: 324 10*3/uL (ref 150–400)
RBC: 6.03 MIL/uL — ABNORMAL HIGH (ref 3.87–5.11)
RDW: 14.6 % (ref 11.5–15.5)
WBC: 11.9 10*3/uL — ABNORMAL HIGH (ref 4.0–10.5)
nRBC: 0 % (ref 0.0–0.2)

## 2018-05-06 LAB — BASIC METABOLIC PANEL
Anion gap: 12 (ref 5–15)
BUN: 15 mg/dL (ref 6–20)
CO2: 18 mmol/L — ABNORMAL LOW (ref 22–32)
Calcium: 9.7 mg/dL (ref 8.9–10.3)
Chloride: 97 mmol/L — ABNORMAL LOW (ref 98–111)
Creatinine, Ser: 1.54 mg/dL — ABNORMAL HIGH (ref 0.44–1.00)
GFR calc Af Amer: 46 mL/min — ABNORMAL LOW (ref >60)
GFR calc non Af Amer: 39 mL/min — ABNORMAL LOW (ref >60)
Glucose, Bld: 355 mg/dL — ABNORMAL HIGH (ref 70–99)
Potassium: 4.4 mmol/L (ref 3.5–5.1)
Sodium: 127 mmol/L — ABNORMAL LOW (ref 135–145)

## 2018-05-06 LAB — CBG MONITORING, ED
Glucose-Capillary: 366 mg/dL — ABNORMAL HIGH (ref 70–99)
Glucose-Capillary: 314 mg/dL — ABNORMAL HIGH (ref 70–99)
Glucose-Capillary: 250 mg/dL — ABNORMAL HIGH (ref 70–99)
Glucose-Capillary: 258 mg/dL — ABNORMAL HIGH (ref 70–99)

## 2018-05-06 LAB — HEMOGLOBIN A1C
Hgb A1c MFr Bld: 14.9 % — ABNORMAL HIGH (ref 4.8–5.6)
Mean Plasma Glucose: 380.93 mg/dL

## 2018-05-06 MED ORDER — LIVING WELL WITH DIABETES BOOK
Freq: Once | Status: AC
  Administered 2018-05-06: 21:00:00

## 2018-05-06 MED ORDER — INSULIN ASPART 100 UNIT/ML ~~LOC~~ SOLN
10.0000 [IU] | Freq: Once | SUBCUTANEOUS | Status: AC
  Administered 2018-05-06: 10 [IU] via INTRAVENOUS
  Filled 2018-05-06: qty 1

## 2018-05-06 MED ORDER — DIPHENHYDRAMINE HCL 50 MG/ML IJ SOLN
12.5000 mg | Freq: Once | INTRAMUSCULAR | Status: AC
  Administered 2018-05-06: 12.5 mg via INTRAVENOUS
  Filled 2018-05-06: qty 1

## 2018-05-06 MED ORDER — DIPHENHYDRAMINE HCL 50 MG/ML IJ SOLN
25.0000 mg | Freq: Once | INTRAMUSCULAR | Status: AC
  Administered 2018-05-06: 25 mg via INTRAVENOUS
  Filled 2018-05-06: qty 1

## 2018-05-06 MED ORDER — PROCHLORPERAZINE EDISYLATE 10 MG/2ML IJ SOLN
10.0000 mg | Freq: Once | INTRAMUSCULAR | Status: AC
  Administered 2018-05-06: 10 mg via INTRAVENOUS
  Filled 2018-05-06: qty 2

## 2018-05-06 MED ORDER — MECLIZINE HCL 25 MG PO TABS
25.0000 mg | ORAL_TABLET | Freq: Once | ORAL | Status: AC
  Administered 2018-05-06: 25 mg via ORAL
  Filled 2018-05-06: qty 1

## 2018-05-06 MED ORDER — TETRACAINE HCL 0.5 % OP SOLN
1.0000 [drp] | Freq: Once | OPHTHALMIC | Status: AC
  Administered 2018-05-06: 1 [drp] via OPHTHALMIC
  Filled 2018-05-06: qty 4

## 2018-05-06 MED ORDER — METOCLOPRAMIDE HCL 5 MG/ML IJ SOLN
10.0000 mg | Freq: Once | INTRAMUSCULAR | Status: AC
  Administered 2018-05-06: 10 mg via INTRAVENOUS
  Filled 2018-05-06: qty 2

## 2018-05-06 MED ORDER — SODIUM CHLORIDE 0.9 % IV SOLN: 500.0000 mg | Freq: Once | INTRAVENOUS | Status: DC

## 2018-05-06 MED ORDER — LACTATED RINGERS IV BOLUS
2000.0000 mL | Freq: Once | INTRAVENOUS | Status: AC
  Administered 2018-05-06: 2000 mL via INTRAVENOUS

## 2018-05-06 NOTE — ED Notes (Signed)
Patient transported to MRI 

## 2018-05-06 NOTE — ED Provider Notes (Signed)
Received care of patient at 4 PM from Dr. Estrella Myrtle.  Please see his note for prior history, physical, and care.  Briefly this is a 48 year old female with a history of diabetes who presents with concern for right temporal headache.  She had been seen previously in the emergency department for the same, and had CT head which was within normal limits.  Concern for uncontrolled glucose contributing to symptoms, and diabetes education consulted.  On my evaluation, she describes the continuous right-sided headache which is not improved with the IV fluids and treatment in the emergency department.  In addition, she reports sensation of dizziness, being off balance, which has resulted in falls at home.  She reports intermittent diplopia, as well as intermittent blurred vision.  Ordered MR brain given these concerns, which shows no acute abnormalities.  Gave meclizine, compazine, benadryl, however she has continuing headache and is still unable to ambulate in the ED.  Nursing noted episodes of eye deviation to the left while patient still alert and talking, and I also witnessed this episode which was followed by nystagmus with concern for rotary component.  Head impulse testing does not exacerbate symptoms.  Given continued difficulty ambulating, hx of intermittent double vision, question of focal seizures, discussed with Dr. Leonel Ramsay.  WIll order CTA with venous phase, admit for EEG and continued monitoring of glucose. Consider HHS although glucose only in 300s.  Dr. Blaine Hamper to admit.    Gareth Morgan, MD 05/07/18 [id number]

## 2018-05-06 NOTE — ED Notes (Signed)
Patient was very dizzy when I stood her up. She could not stand she had to sit back down right away.

## 2018-05-06 NOTE — Progress Notes (Signed)
Inpatient Diabetes Program Recommendations  AACE/ADA: New Consensus Statement on Inpatient Glycemic Control (2015)  Target Ranges:  Prepandial:   less than 140 mg/dL      Peak postprandial:   less than 180 mg/dL (1-2 hours)      Critically ill patients:  140 - 180 mg/dL   Lab Results  Component Value Date   GLUCAP 314 (H) 05/06/2018   HGBA1C 14.9 (H) 05/06/2018    Review of Glycemic Control  Diabetes history: DM2 Outpatient Diabetes medications: Amaryl 2 mg QD Current orders for Inpatient glycemic control: Received Novolog 10 units IV  HgbA1C - 14.9%.  Note HgbA1c was 6.2% on 11/06/2017  Inpatient Diabetes Program Recommendations:     Lantus 20 units Q24H Novolog 0-15 units Q4H x 12H, then tidwc and hs  Spoke with pt regarding HgbA1C of 14.9%. Pt states she checks her blood sugars 2x/day and takes Amaryl 2 mg QD. States blood sugars run in 300s. Gets very little exercise. States she thinks she "doesn't eat enough." Has blurred vision along with polyuria, polydipsia. Sees PCP for diabetes management. Last appt 11/06/2017. HgbA1C was 6.5% in Sept. Pt had been drinking a lot of juices, but has cut back recently and mostly drinks water. Willing to go to OP Diabetes Education. States she feels dizzy at present - "I think I'm going to fall out." RN notified. Will likely need to go home on insulin.   Will f/u with pt in am.  Thank you. Lorenda Peck, RD, LDN, CDE Inpatient Diabetes Coordinator [phone]

## 2018-05-06 NOTE — ED Notes (Addendum)
Attempted to ambulate patient, but not successful as patient was unsteady when standing. Patient complaining of dizziness and headache despite medication. Once placed back into bed, patient's head/eyes shifted to the left and patient complaining of severe dizziness. Patient unable to follow finger movements with eyes, but able to answer questions during episode which lasted about 45 seconds. A&O x4 after episode. MD made aware.

## 2018-05-06 NOTE — H&P (Addendum)
History and Physical    Rebecca Johnston EXH:[id number] DOB: 1970-12-27 DOA: 05/06/2018  Referring MD/NP/PA:   PCP: Ladell Pier, MD   Patient coming from:  The patient is coming from home.  At baseline, pt is independent for most of ADL.        Chief Complaint: headache, blurry vision, dizziness  HPI: Rebecca Johnston is a 48 y.o. female with medical history significant of diabetes mellitus, asthma, cardiac, obesity, CKD stage III, who presents with headache, blurry vision, dizziness.  Patient states that she has been having headache for more than 1 week.  The headache is located in the right temporal area, constant, 8 out of 10 severity, nonradiating.  It is associated with blurry vision, one time of diplopia, dizziness. She also report right leg weakness.  Slurred speech and facial droop.  No tingling or numbness in extremities. Patient states that she feels dizzy and fell several times at home without any injury.  No loss of consciousness.  She also has nausea and vomited once at home, currently no nausea vomiting, diarrhea or abdominal pain.  Denies symptoms of UTI. In ED, pt was noted to have eye deviation to the left side intermittently.  Of note, pt was diagnosed as community-acquired pneumonia on 2/27 due to cold-like symptoms, including persistent cough, nasal congestion and some chest pain. She was put on doxycycline by EDP.  Patient states that her respiratory symptoms have resolved, currently no cough, chest pain, shortness of breath, fever or chills, but she states that doxycycline gave her a lot of side effects including hallucination, dizziness, bad dreaming. She stopped taking doxycycline yesterday.  ED Course: pt was found to have WBC 11.9, negative urinalysis, elevated blood sugar 355 with normal anion gap (patient was given 10 units of NovoLog in ED), stable renal function, temperature 99, tachycardia, oxygen saturation 96% on room air, chest x-ray negative, CT angiogram of  head and neck negative. CT of head negative.  MRI for brain is negative for stroke. Pt is placed on telemetry bed for observation.  Neurology, Dr. Leonel Ramsay was consulted.  MRI of brain: 1. Limited sequences. 2. No acute stroke, hemorrhage, or mass effect identified. 3. Partially visualized left larger than right prominent parotid gland ducts, possibly physiologic or related to downstream obstruction.  Review of Systems:   General: no fevers, chills, no body weight gain, has fatigue HEENT: no blurry vision, hearing changes or sore throat Respiratory: no dyspnea, coughing, wheezing CV: no chest pain, no palpitations GI: had nausea, vomiting, no abdominal pain, diarrhea, constipation GU: no dysuria, burning on urination, increased urinary frequency, hematuria  Ext: no leg edema Neuro: has right leg weakness, dizziness, diplopia, blurry vision, eye deviation to the left, headache Skin: no rash, no skin tear. MSK: No muscle spasm, no deformity, no limitation of range of movement in spin Heme: No easy bruising.  Travel history: No recent long distant travel.  Allergy:  Allergies  Allergen Reactions  . Doxycycline     "bad dreaming, hallucination, dizziness" per pt    Past Medical History:  Diagnosis Date  . Anemia   . Asthma   . Diabetes mellitus without complication ([place]) 38/1017  . Fibroids   . GERD (gastroesophageal reflux disease)   . History of blood transfusion Feb 2015  . Obesity, Class III, BMI 40-49.9 (morbid obesity) ([place])   . Shortness of breath dyspnea     Past Surgical History:  Procedure Laterality Date  . ABDOMINAL HYSTERECTOMY N/A 12/08/2014  Procedure: HYSTERECTOMY ABDOMINAL;  Surgeon: Woodroe Mode, MD;  Location: [facility];  Service: Gynecology;  Laterality: N/A;  . DILATION AND CURETTAGE OF UTERUS    . DILITATION & CURRETTAGE/HYSTROSCOPY WITH VERSAPOINT RESECTION N/A 01/06/2014   Procedure: Corena Pilgrim WITH VERSAPOINT;  Surgeon: Woodroe Mode, MD;   Location: [facility];  Service: Gynecology;  Laterality: N/A;  . LAPAROSCOPIC LYSIS OF ADHESIONS N/A 12/08/2014   Procedure: LAPAROSCOPIC LYSIS OF ADHESIONS;  Surgeon: Michael Boston, MD;  Location: [facility];  Service: General;  Laterality: N/A;  . OVARIAN CYST REMOVAL     cyst not removed. Cyst drained  . SUPRA-UMBILICAL HERNIA N/A 35/07/9739   Procedure: LAPARSCOPIC SUPRA-UMBILICAL HERNIA REPAIR ;  Surgeon: Michael Boston, MD;  Location: [facility];  Service: General;  Laterality: N/A;  . VENTRAL HERNIA REPAIR N/A 12/08/2014   Procedure: LAPAROSCOPIC INCARCERATED INCISIONAL VENTRAL WALL HERNIA REPAIR;  Surgeon: Michael Boston, MD;  Location: [facility];  Service: General;  Laterality: N/A;    Social History:  reports that she quit smoking about 2 years ago. Her smoking use included cigarettes. She has a 6.25 pack-year smoking history. She has never used smokeless tobacco. She reports current alcohol use. She reports that she does not use drugs.  Family History:  Family History  Problem Relation Age of Onset  . Hypertension Mother   . Diabetes Father   . Breast cancer Maternal Aunt   . Diabetes Maternal Aunt   . Diabetes Paternal Aunt   . Diabetes Maternal Aunt   . Diabetes Maternal Aunt      Prior to Admission medications   Medication Sig Start Date End Date Taking? Authorizing Provider  aspirin-acetaminophen-caffeine (EXCEDRIN MIGRAINE) [phone] MG tablet Take 2 tablets by mouth every 6 (six) hours as needed for headache.   Yes [provider]  cetirizine (ZYRTEC) 10 MG tablet Take 1 tablet (10 mg total) by mouth daily. Patient taking differently: Take 10 mg by mouth daily as needed for allergies.  04/16/17  Yes Ladell Pier, MD  doxycycline (VIBRAMYCIN) 100 MG capsule Take 1 capsule (100 mg total) by mouth 2 (two) times daily. 04/29/18  Yes Maudie Flakes, MD  fluticasone (FLONASE) 50 MCG/ACT nasal spray USE 2 SPRAY(S) IN EACH NOSTRIL ONCE DAILY Patient taking differently: Place 2 sprays  into both nostrils daily.  04/26/18  Yes Ladell Pier, MD  gabapentin (NEURONTIN) 100 MG capsule Take 100 mg by mouth 1-3 times daily as needed for nerve pains Patient taking differently: Take 100 mg by mouth 3 (three) times daily as needed (nerve pain).  04/04/16  Yes Funches, Josalyn, MD  glimepiride (AMARYL) 2 MG tablet Take 1 tablet (2 mg total) by mouth daily before breakfast. MUST MAKE APPT FOR FURTHER REFILLS 04/26/18  Yes Ladell Pier, MD  ranitidine (ZANTAC) 300 MG tablet 1 tab PO QHS PRN Patient taking differently: Take 300 mg by mouth at bedtime as needed for heartburn.  04/16/17  Yes Ladell Pier, MD  ACCU-CHEK SOFTCLIX LANCETS lancets 1 each by Other route 2 (two) times daily. 12/27/15   Funches, Adriana Mccallum, MD  Blood Glucose Monitoring Suppl (ACCU-CHEK AVIVA PLUS) w/Device KIT 1 Device by Does not apply route 2 (two) times daily. 01/02/17   Tresa Garter, MD  glucose blood (ACCU-CHEK AVIVA PLUS) test strip 1 each by Other route 2 (two) times daily. MUST MAKE APPT FOR FURTHER REFILLS 04/26/18   Ladell Pier, MD  mupirocin ointment (BACTROBAN) 2 % Apply 1 application topically 2 (two)  times daily. Patient not taking: Reported on 04/29/2018 04/22/17   Ladell Pier, MD  [facility] HFA 108 [phone] Base) MCG/ACT inhaler Inhale 2 puffs into the lungs every 6 (six) hours as needed for wheezing or shortness of breath. Patient not taking: Reported on 05/06/2018 04/17/17   Ladell Pier, MD    Physical Exam: Vitals:   05/06/18 1927 05/06/18 2100 05/06/18 2200 05/07/18 0000  BP: (!) 131/93 125/68 (!) 156/88 133/79  Pulse: (!) 104 (!) 103 (!) 111 (!) 117  Resp: '19 18 17 20  ' Temp:      TempSrc:      SpO2: 96% 96% 99% 95%   General: Not in acute distress HEENT: Patient does not have temporal cord-like structure palpated, no tenderness in the temporal area (patient states that her pain is inside of the brain)       Eyes: PERRL, EOMI, no scleral icterus.       ENT: No  discharge from the ears and nose, no pharynx injection, no tonsillar enlargement.        Neck: No JVD, no bruit, no mass felt. Heme: No neck lymph node enlargement. Cardiac: S1/S2, RRR, No murmurs, No gallops or rubs. Respiratory:  No rales, wheezing, rhonchi or rubs. GI: Soft, nondistended, nontender, no rebound pain, no organomegaly, BS present. GU: No hematuria Ext: No pitting leg edema bilaterally. 2+DP/PT pulse bilaterally. Musculoskeletal: No joint deformities, No joint redness or warmth, no limitation of ROM in spin. Skin: No rashes.  Neuro: Alert, oriented X3, cranial nerves II-XII grossly intact (execept for eye deviation to the left intermittently), moves all extremities normally. Muscle strength 5/5 in all extremities, sensation to light touch intact. Negative Babinski's sign. Psych: Patient is not psychotic, no suicidal or hemocidal ideation.  Labs on Admission: I have personally reviewed following labs and imaging studies  CBC: Recent Labs  Lab 05/03/18 1303 05/06/18 1300  WBC 9.1 11.9*  NEUTROABS 6.3 9.4*  HGB 14.0 12.8  HCT 46.1* 42.6  MCV 70.1* 70.6*  PLT 341 250   Basic Metabolic Panel: Recent Labs  Lab 05/03/18 1303 05/06/18 1300  NA 130* 127*  K 4.3 4.4  CL 101 97*  CO2 16* 18*  GLUCOSE 355* 355*  BUN 18 15  CREATININE 1.71* 1.54*  CALCIUM 9.8 9.7   GFR: Estimated Creatinine Clearance: 58.7 mL/min (A) (by C-G formula based on SCr of 1.54 mg/dL (H)). Liver Function Tests: No results for input(s): AST, ALT, ALKPHOS, BILITOT, PROT, ALBUMIN in the last 168 hours. No results for input(s): LIPASE, AMYLASE in the last 168 hours. No results for input(s): AMMONIA in the last 168 hours. Coagulation Profile: No results for input(s): INR, PROTIME in the last 168 hours. Cardiac Enzymes: No results for input(s): CKTOTAL, CKMB, CKMBINDEX, TROPONINI in the last 168 hours. BNP (last 3 results) No results for input(s): PROBNP in the last 8760  hours. HbA1C: Recent Labs    05/06/18 1300  HGBA1C 14.9*   CBG: Recent Labs  Lab 05/06/18 1214 05/06/18 1401 05/06/18 2009 05/06/18 2212 05/07/18 0140  GLUCAP 366* 314* 250* 258* 249*   Lipid Profile: No results for input(s): CHOL, HDL, LDLCALC, TRIG, CHOLHDL, LDLDIRECT in the last 72 hours. Thyroid Function Tests: No results for input(s): TSH, T4TOTAL, FREET4, T3FREE, THYROIDAB in the last 72 hours. Anemia Panel: No results for input(s): VITAMINB12, FOLATE, FERRITIN, TIBC, IRON, RETICCTPCT in the last 72 hours. Urine analysis:    Component Value Date/Time   COLORURINE STRAW (A) 05/06/2018 1216  APPEARANCEUR CLEAR 05/06/2018 1216   LABSPEC 1.007 05/06/2018 1216   PHURINE 6.0 05/06/2018 1216   GLUCOSEU >=500 (A) 05/06/2018 1216   HGBUR SMALL (A) 05/06/2018 1216   BILIRUBINUR NEGATIVE 05/06/2018 1216   KETONESUR 5 (A) 05/06/2018 1216   PROTEINUR NEGATIVE 05/06/2018 1216   UROBILINOGEN 0.2 05/03/2018 1156   NITRITE NEGATIVE 05/06/2018 1216   LEUKOCYTESUR NEGATIVE 05/06/2018 1216   Sepsis Labs: '@LABRCNTIP' (procalcitonin:4,lacticidven:4) )No results found for this or any previous visit (from the past 240 hour(s)).   Radiological Exams on Admission: Ct Angio Head W Or Wo Contrast  Result Date: 05/07/2018 CLINICAL DATA:  RIGHT-sided headache. Symptoms worse after starting antibiotics for pneumonia. Dizziness. History of diabetes. EXAM: CT ANGIOGRAPHY HEAD AND NECK TECHNIQUE: Multidetector CT imaging of the head and neck was performed using the standard protocol during bolus administration of intravenous contrast. Multiplanar CT image reconstructions and MIPs were obtained to evaluate the vascular anatomy. Carotid stenosis measurements (when applicable) are obtained utilizing NASCET criteria, using the distal internal carotid diameter as the denominator. CONTRAST:  67m OMNIPAQUE IOHEXOL 350 MG/ML SOLN COMPARISON:  MRI head May 06, 2018 FINDINGS: CT HEAD FINDINGS BRAIN: No  intraparenchymal hemorrhage, mass effect nor midline shift. The ventricles and sulci are normal. No acute large vascular territory infarcts. No abnormal extra-axial fluid collections. Basal cisterns are patent. VASCULAR: Unremarkable. SKULL/SOFT TISSUES: No skull fracture. No significant soft tissue swelling. ORBITS/SINUSES: The included ocular globes and orbital contents are normal.Trace paranasal sinus mucosal thickening. Mastoid air cells are well aerated. OTHER: None. CTA NECK FINDINGS: AORTIC ARCH: Normal appearance of the thoracic arch, normal branch pattern. The origins of the innominate, left Common carotid artery and subclavian artery are patent. RIGHT CAROTID SYSTEM: Common carotid artery is patent, medial course. Normal appearance of the carotid bifurcation without hemodynamically significant stenosis by NASCET criteria. Normal appearance of the internal carotid artery. LEFT CAROTID SYSTEM: Common carotid artery is patent, medial course. Normal appearance of the carotid bifurcation without hemodynamically significant stenosis by NASCET criteria. Normal appearance of the internal carotid artery. VERTEBRAL ARTERIES:Codominant patent vertebral arteries. SKELETON: No acute osseous process though bone windows have not been submitted. Mild cervical spondylosis. OTHER NECK: Soft tissues of the neck are nonacute though, not tailored for evaluation. UPPER CHEST: Included lung apices are clear. No superior mediastinal lymphadenopathy. CTA HEAD FINDINGS: ANTERIOR CIRCULATION: Patent cervical internal carotid arteries, petrous, cavernous and supra clinoid internal carotid arteries. Patent anterior communicating artery. Patent anterior and middle cerebral arteries. No large vessel occlusion, flow-limiting stenosis, contrast extravasation or aneurysm. POSTERIOR CIRCULATION: Patent vertebral arteries, vertebrobasilar junction and basilar artery, as well as main branch vessels. Patent posterior cerebral arteries. Small  bilateral posterior communicating arteries present. Chest No large vessel occlusion, flow-limiting stenosis, contrast extravasation or aneurysm. VENOUS SINUSES: Major dural venous sinuses are patent though not tailored for evaluation on this angiographic examination. ANATOMIC VARIANTS: None. DELAYED PHASE: Not performed. MIP images reviewed. IMPRESSION: 1. Normal CT HEAD with and without contrast. 2. Normal CTA HEAD. 3. Normal CTA NECK. Electronically Signed   By: CElon AlasM.D.   On: 05/07/2018 00:50   Dg Chest 2 View  Result Date: 05/06/2018 CLINICAL DATA:  Headache, cough, recent history of pneumonia. EXAM: CHEST - 2 VIEW COMPARISON:  Chest x-rays dated 04/29/2018 and 04/27/2013. FINDINGS: Study is hypoinspiratory with crowding of the perihilar and bibasilar bronchovascular markings. Given the low lung volumes, lungs appear clear. No evidence of consolidating pneumonia. No pleural effusion or pneumothorax seen. Heart size and mediastinal contours are within  normal limits. No acute or suspicious osseous finding. IMPRESSION: Low lung volumes. No active cardiopulmonary disease. No evidence of pneumonia. Electronically Signed   By: Franki Cabot M.D.   On: 05/06/2018 13:58   Ct Angio Neck W And/or Wo Contrast  Result Date: 05/07/2018 CLINICAL DATA:  RIGHT-sided headache. Symptoms worse after starting antibiotics for pneumonia. Dizziness. History of diabetes. EXAM: CT ANGIOGRAPHY HEAD AND NECK TECHNIQUE: Multidetector CT imaging of the head and neck was performed using the standard protocol during bolus administration of intravenous contrast. Multiplanar CT image reconstructions and MIPs were obtained to evaluate the vascular anatomy. Carotid stenosis measurements (when applicable) are obtained utilizing NASCET criteria, using the distal internal carotid diameter as the denominator. CONTRAST:  4m OMNIPAQUE IOHEXOL 350 MG/ML SOLN COMPARISON:  MRI head May 06, 2018 FINDINGS: CT HEAD FINDINGS BRAIN: No  intraparenchymal hemorrhage, mass effect nor midline shift. The ventricles and sulci are normal. No acute large vascular territory infarcts. No abnormal extra-axial fluid collections. Basal cisterns are patent. VASCULAR: Unremarkable. SKULL/SOFT TISSUES: No skull fracture. No significant soft tissue swelling. ORBITS/SINUSES: The included ocular globes and orbital contents are normal.Trace paranasal sinus mucosal thickening. Mastoid air cells are well aerated. OTHER: None. CTA NECK FINDINGS: AORTIC ARCH: Normal appearance of the thoracic arch, normal branch pattern. The origins of the innominate, left Common carotid artery and subclavian artery are patent. RIGHT CAROTID SYSTEM: Common carotid artery is patent, medial course. Normal appearance of the carotid bifurcation without hemodynamically significant stenosis by NASCET criteria. Normal appearance of the internal carotid artery. LEFT CAROTID SYSTEM: Common carotid artery is patent, medial course. Normal appearance of the carotid bifurcation without hemodynamically significant stenosis by NASCET criteria. Normal appearance of the internal carotid artery. VERTEBRAL ARTERIES:Codominant patent vertebral arteries. SKELETON: No acute osseous process though bone windows have not been submitted. Mild cervical spondylosis. OTHER NECK: Soft tissues of the neck are nonacute though, not tailored for evaluation. UPPER CHEST: Included lung apices are clear. No superior mediastinal lymphadenopathy. CTA HEAD FINDINGS: ANTERIOR CIRCULATION: Patent cervical internal carotid arteries, petrous, cavernous and supra clinoid internal carotid arteries. Patent anterior communicating artery. Patent anterior and middle cerebral arteries. No large vessel occlusion, flow-limiting stenosis, contrast extravasation or aneurysm. POSTERIOR CIRCULATION: Patent vertebral arteries, vertebrobasilar junction and basilar artery, as well as main branch vessels. Patent posterior cerebral arteries. Small  bilateral posterior communicating arteries present. Chest No large vessel occlusion, flow-limiting stenosis, contrast extravasation or aneurysm. VENOUS SINUSES: Major dural venous sinuses are patent though not tailored for evaluation on this angiographic examination. ANATOMIC VARIANTS: None. DELAYED PHASE: Not performed. MIP images reviewed. IMPRESSION: 1. Normal CT HEAD with and without contrast. 2. Normal CTA HEAD. 3. Normal CTA NECK. Electronically Signed   By: CElon AlasM.D.   On: 05/07/2018 00:50   Mr Brain Wo Contrast  Result Date: 05/06/2018 CLINICAL DATA:  48y/o  F; worsening right-sided headache. EXAM: MRI HEAD WITHOUT CONTRAST TECHNIQUE: Axial DWI, axial T2, axial T2 propeller sequences were acquired. COMPARISON:  05/03/2018 CT head FINDINGS: Brain: No reduced diffusion to suggest acute or early subacute infarction. No abnormal T2 or T2 FLAIR weighted signal of the brain. No mass effect, gross extra-axial collection, hydrocephalus, or herniation is evident. Vascular: Normal T2 flow voids. Skull and upper cervical spine: Normal T2 signal. Sinuses/Orbits: Negative. Other: Partially visualized left larger than right prominent parotid gland ducts. IMPRESSION: 1. Limited sequences. 2. No acute stroke, hemorrhage, or mass effect identified. 3. Partially visualized left larger than right prominent parotid gland ducts, possibly physiologic  or related to downstream obstruction. Electronically Signed   By: Kristine Garbe M.D.   On: 05/06/2018 18:27     EKG:   Not done in ED, will get one.   Assessment/Plan Principal Problem:   Headache Active Problems:   Type II diabetes mellitus with renal manifestations (HCC)   CKD (chronic kidney disease), stage III (HCC)   Lobar pneumonia (HCC)   GERD (gastroesophageal reflux disease)   Hyponatremia   Headache: Patient has been having right temporal headache for more than 1 week, associated with blurry vision, one-time diplopia, dizziness.   She also reports right leg weakness, but on examination, her muscle strength is normal.  Patient was noted to have eye deviation to the left side.  Etiology is not clear, differential diagnosis include focal seizure, TIA, temporal arteritis. Per EDP, Dr. Eugenio Hoes note, "IOPs checked today and normal", unlikely to have glaucoma. Her blood sugar is significantly elevated, which may have contributed partially.  MRI is negative for stroke.  CT angiogram of head and neck negative.  Patient was treated with Benadryl and Reglan in ED.  Neurology, Dr. Leonel Ramsay was consulted.   -Placed on telemetry bed for observation -EEG per Dr. Cecil Cobbs recommendation -Seizure precaution -PRN Ativan for seizure -Frequent neuro check and fall precaution -PRN Tylenol for headache -ESR and CRP to rule out temporal arteritis  Addendum 3:07 AM: Floor RN reported that pt developed seizure likely activity. Pt still has eye deviation to the left side, and now developed right leg and arm spasm. Pt seems to have seizure. I spoke with Dr. Leonel Ramsay, who recommended to load patient with 1500 mg of Keppra, followed by 500 mg twice daily, then give a dose of Ativan now. -Continue Ativan 1 mg as needed for seizure -IV1500 mg of Keppra, followed by 500 mg twice daily -if pt still have persistent seizure, will transfer to SDU   Addendum: CRP>1.0, but  ESR is only 21. Given her age is <13 yo and the fact that she seems to have seizure, I have low suspicion for temporal arteritis.  Will not start steroid treatment now.  Type II diabetes mellitus with renal manifestations ([place]): Last A1c 14.9 on 05/06/18, poorly controled. Patient is taking Amaryl at home.  Blood sugar is significantly elevated 355.  Bicarbonate 18, but anion gap is normal, no DKA now.  Patient was given 10 units of NovoLog in ED. -will start Lantus 5 units daily -SSI  CKD (chronic kidney disease), stage III ([place]): Stable.  Baseline creatinine 1.6-1.8.  Her  creatinine is 1.54, BUN 15. -Follow-up by BMP  GERD (gastroesophageal reflux disease): -pepcid prn  Hyponatremia: Na 127 which is corrected to 132.1, mild -pt was given 2L of ringer solution in ED -will continue NS at 100 cc/h  Lobar pneumonia [facility]): Patient was diagnosed as a community-acquired pneumonia on 2/27, and was treated with doxycycline, which is started yesterday due to side effects.  Her symptoms has resolved.  Currently patient does not have fever, chills, shortness breath, cough, chest pain.  She still has mild leukocytosis 11.9, temperature 99. -Hold off antibiotics - Get blood culture   DVT ppx: SQ Lovenox Code Status: Full code Family Communication: None at bed side.     Disposition Plan:  Anticipate discharge back to previous home environment Consults called: Dr. Leonel Ramsay of neurology Admission status: Obs / tele     Date of Service 05/07/2018    [facility]   If 7PM-7AM, please contact night-coverage [email or web address] Password [email or web address]  05/07/2018, 1:44 AM

## 2018-05-06 NOTE — ED Provider Notes (Signed)
[facility] DEPT Provider Note   CSN: [id number] Arrival date & time: 05/06/18  6283    History   Chief Complaint Chief Complaint  Patient presents with  . Headache    HPI Rebecca Johnston is a 48 y.o. female.  HPI   47yF with headache. R temporal region. Onset over a week ago. Seen in the ED for the third time now in the past week and also once at [facility]. Has been persistently hyperglycemic. She is on glipizide. She reports compliance. Intermittent blurred vision. Polyuria. Polydipsia. Mild nausea. No vomiting. No dysuria. Headache.  Past Medical History:  Diagnosis Date  . Anemia   . Asthma   . Diabetes mellitus without complication ([place]) 15/1761  . Fibroids   . GERD (gastroesophageal reflux disease)   . History of blood transfusion Feb 2015  . Obesity, Class III, BMI 40-49.9 (morbid obesity) ([place])   . Shortness of breath dyspnea     Patient Active Problem List   Diagnosis Date Noted  . CKD (chronic kidney disease), stage III ([place]) 04/16/2017  . Neuropathy 04/04/2016  . Hx of iron deficiency anemia 12/21/2015  . Seasonal allergic rhinitis 12/21/2015  . Type 2 diabetes mellitus without complications ([place]) 60/73/7106  . Incarcerated hernia s/p lap repair w mesh 12/08/2014 12/08/2014  . Esophageal reflux 10/21/2013  . Abnormal CT scan, chest 09/02/2013    Past Surgical History:  Procedure Laterality Date  . ABDOMINAL HYSTERECTOMY N/A 12/08/2014   Procedure: HYSTERECTOMY ABDOMINAL;  Surgeon: Woodroe Mode, MD;  Location: [facility];  Service: Gynecology;  Laterality: N/A;  . DILATION AND CURETTAGE OF UTERUS    . DILITATION & CURRETTAGE/HYSTROSCOPY WITH VERSAPOINT RESECTION N/A 01/06/2014   Procedure: Corena Pilgrim WITH VERSAPOINT;  Surgeon: Woodroe Mode, MD;  Location: [facility];  Service: Gynecology;  Laterality: N/A;  . LAPAROSCOPIC LYSIS OF ADHESIONS N/A 12/08/2014   Procedure: LAPAROSCOPIC LYSIS OF ADHESIONS;  Surgeon: Michael Boston, MD;  Location:  [facility];  Service: General;  Laterality: N/A;  . OVARIAN CYST REMOVAL     cyst not removed. Cyst drained  . SUPRA-UMBILICAL HERNIA N/A 26/11/4852   Procedure: LAPARSCOPIC SUPRA-UMBILICAL HERNIA REPAIR ;  Surgeon: Michael Boston, MD;  Location: [facility];  Service: General;  Laterality: N/A;  . VENTRAL HERNIA REPAIR N/A 12/08/2014   Procedure: LAPAROSCOPIC INCARCERATED INCISIONAL VENTRAL WALL HERNIA REPAIR;  Surgeon: Michael Boston, MD;  Location: [facility];  Service: General;  Laterality: N/A;     OB History    Gravida  1   Para  0   Term  0   Preterm  0   AB  1   Living  0     SAB  0   TAB  0   Ectopic  1   Multiple  0   Live Births               Home Medications    Prior to Admission medications   Medication Sig Start Date End Date Taking? Authorizing Provider  aspirin-acetaminophen-caffeine (EXCEDRIN MIGRAINE) [phone] MG tablet Take 2 tablets by mouth every 6 (six) hours as needed for headache.   Yes [provider]  cetirizine (ZYRTEC) 10 MG tablet Take 1 tablet (10 mg total) by mouth daily. Patient taking differently: Take 10 mg by mouth daily as needed for allergies.  04/16/17  Yes Ladell Pier, MD  doxycycline (VIBRAMYCIN) 100 MG capsule Take 1 capsule (100 mg total) by mouth 2 (two) times daily. 04/29/18  Yes  Maudie Flakes, MD  fluticasone [facility]) 50 MCG/ACT nasal spray USE 2 SPRAY(S) IN EACH NOSTRIL ONCE DAILY Patient taking differently: Place 2 sprays into both nostrils daily.  04/26/18  Yes Ladell Pier, MD  gabapentin (NEURONTIN) 100 MG capsule Take 100 mg by mouth 1-3 times daily as needed for nerve pains Patient taking differently: Take 100 mg by mouth 3 (three) times daily as needed (nerve pain).  04/04/16  Yes Funches, Josalyn, MD  glimepiride (AMARYL) 2 MG tablet Take 1 tablet (2 mg total) by mouth daily before breakfast. MUST MAKE APPT FOR FURTHER REFILLS 04/26/18  Yes Ladell Pier, MD  ranitidine (ZANTAC) 300 MG tablet 1 tab PO QHS  PRN Patient taking differently: Take 300 mg by mouth at bedtime as needed for heartburn.  04/16/17  Yes Ladell Pier, MD  ACCU-CHEK SOFTCLIX LANCETS lancets 1 each by Other route 2 (two) times daily. 12/27/15   Funches, Adriana Mccallum, MD  Blood Glucose Monitoring Suppl (ACCU-CHEK AVIVA PLUS) w/Device KIT 1 Device by Does not apply route 2 (two) times daily. 01/02/17   Tresa Garter, MD  glucose blood (ACCU-CHEK AVIVA PLUS) test strip 1 each by Other route 2 (two) times daily. MUST MAKE APPT FOR FURTHER REFILLS 04/26/18   Ladell Pier, MD  mupirocin ointment (BACTROBAN) 2 % Apply 1 application topically 2 (two) times daily. Patient not taking: Reported on 04/29/2018 04/22/17   Ladell Pier, MD  [facility] HFA 108 [phone] Base) MCG/ACT inhaler Inhale 2 puffs into the lungs every 6 (six) hours as needed for wheezing or shortness of breath. Patient not taking: Reported on 05/06/2018 04/17/17   Ladell Pier, MD    Family History Family History  Problem Relation Age of Onset  . Hypertension Mother   . Diabetes Father   . Breast cancer Maternal Aunt   . Diabetes Maternal Aunt   . Diabetes Paternal Aunt   . Diabetes Maternal Aunt   . Diabetes Maternal Aunt     Social History Social History   Tobacco Use  . Smoking status: Former Smoker    Packs/day: 0.25    Years: 25.00    Pack years: 6.25    Types: Cigarettes    Last attempt to quit: 08/02/2015    Years since quitting: 2.7  . Smokeless tobacco: Never Used  Substance Use Topics  . Alcohol use: Yes    Alcohol/week: 0.0 standard drinks    Comment: socially  . Drug use: No     Allergies   Patient has no known allergies.   Review of Systems Review of Systems  All systems reviewed and negative, other than as noted in HPI.  Physical Exam Updated Vital Signs BP 129/80   Pulse 84   Temp 99 F (37.2 C) (Oral)   Resp 20   LMP 11/26/2014 (Exact Date)   SpO2 98%   Physical Exam Vitals signs and nursing note reviewed.   Constitutional:      General: She is not in acute distress.    Appearance: She is well-developed.  HENT:     Head: Normocephalic and atraumatic.  Eyes:     General:        Right eye: No discharge.        Left eye: No discharge.     Extraocular Movements: Extraocular movements intact.     Conjunctiva/sclera: Conjunctivae normal.     Pupils: Pupils are equal, round, and reactive to light.     Comments: IOP OD: 19,18.  Neck:     Musculoskeletal: Neck supple.     Comments: No nuchal rigidity Cardiovascular:     Rate and Rhythm: Normal rate and regular rhythm.     Heart sounds: Normal heart sounds. No murmur. No friction rub. No gallop.   Pulmonary:     Effort: Pulmonary effort is normal. No respiratory distress.     Breath sounds: Normal breath sounds.  Abdominal:     General: There is no distension.     Palpations: Abdomen is soft.     Tenderness: There is no abdominal tenderness.  Musculoskeletal:        General: No tenderness.  Skin:    General: Skin is warm and dry.  Neurological:     Mental Status: She is alert and oriented to person, place, and time.     Cranial Nerves: No cranial nerve deficit.     Sensory: No sensory deficit.     Motor: No weakness.     Coordination: Coordination normal.  Psychiatric:        Behavior: Behavior normal.        Thought Content: Thought content normal.     ED Treatments / Results  Labs (all labs ordered are listed, but only abnormal results are displayed) Labs Reviewed  CBC WITH DIFFERENTIAL/PLATELET - Abnormal; Notable for the following components:      Result Value   WBC 11.9 (*)    RBC 6.03 (*)    MCV 70.6 (*)    MCH 21.2 (*)    Neutro Abs 9.4 (*)    Abs Immature Granulocytes 0.30 (*)    All other components within normal limits  BASIC METABOLIC PANEL - Abnormal; Notable for the following components:   Sodium 127 (*)    Chloride 97 (*)    CO2 18 (*)    Glucose, Bld 355 (*)    Creatinine, Ser 1.54 (*)    GFR calc non  Af Amer 39 (*)    GFR calc Af Amer 46 (*)    All other components within normal limits  URINALYSIS, ROUTINE W REFLEX MICROSCOPIC - Abnormal; Notable for the following components:   Color, Urine STRAW (*)    Glucose, UA >=500 (*)    Hgb urine dipstick SMALL (*)    Ketones, ur 5 (*)    Bacteria, UA RARE (*)    All other components within normal limits  HEMOGLOBIN A1C - Abnormal; Notable for the following components:   Hgb A1c MFr Bld 14.9 (*)    All other components within normal limits  CBG MONITORING, ED - Abnormal; Notable for the following components:   Glucose-Capillary 366 (*)    All other components within normal limits  CBG MONITORING, ED - Abnormal; Notable for the following components:   Glucose-Capillary 314 (*)    All other components within normal limits    EKG None  Radiology Dg Chest 2 View  Result Date: 05/06/2018 CLINICAL DATA:  Headache, cough, recent history of pneumonia. EXAM: CHEST - 2 VIEW COMPARISON:  Chest x-rays dated 04/29/2018 and 04/27/2013. FINDINGS: Study is hypoinspiratory with crowding of the perihilar and bibasilar bronchovascular markings. Given the low lung volumes, lungs appear clear. No evidence of consolidating pneumonia. No pleural effusion or pneumothorax seen. Heart size and mediastinal contours are within normal limits. No acute or suspicious osseous finding. IMPRESSION: Low lung volumes. No active cardiopulmonary disease. No evidence of pneumonia. Electronically Signed   By: Franki Cabot M.D.   On: 05/06/2018 13:58  Procedures Procedures (including critical care time)  Medications Ordered in ED Medications  lactated ringers bolus 2,000 mL (2,000 mLs Intravenous New Bag/Given 05/06/18 1243)  metoCLOPramide (REGLAN) injection 10 mg (10 mg Intravenous Given 05/06/18 1243)  diphenhydrAMINE (BENADRYL) injection 12.5 mg (12.5 mg Intravenous Given 05/06/18 1243)     Initial Impression / Assessment and Plan / ED Course  I have reviewed the triage  vital signs and the nursing notes.  Pertinent labs & imaging results that were available during my care of the patient were reviewed by me and considered in my medical decision making (see chart for details).  47yF with hyperglycemia and headache. I think a lot of her symptoms are from her poorly controlled diabetes. A1C 14.9. I doubt doxycycline contributory and was prescribed more on clinical suspicion than x-ray findings as prior imaging wasn't overly impressive and looks fine today. Just advised to stop. She has been on it almost a week at this point anyways. She had head CT on prior evaluation which was unremarkable. IOPs checked today and normal. This is now her third ED visit for the same symptoms. She has mild metabolic acidosis but no increased anion gap and just minimal ketonuria.   I do not think she requires admission at this time but am concerned for repeat ED visits and continued symptoms unless DM is better controlled. I spoke with hospitalist. Plan is to have the diabetes coordinator see the patient in the ED for additional recommendations with regards to medication recommendations.   Final Clinical Impressions(s) / ED Diagnoses   Final diagnoses:  Hyperglycemia  Poorly controlled diabetes mellitus [facility])    ED Discharge Orders    None       Virgel Manifold, MD 05/10/18 1023

## 2018-05-06 NOTE — ED Triage Notes (Signed)
Pt complains of right sided headache. Pt has been taking doxycycline for pnuemonia. Pt feels symptoms became worse when starting doxycycline. Pt has been taking tylenol and excederine for pain, with some relief.

## 2018-05-07 ENCOUNTER — Inpatient Hospital Stay (HOSPITAL_COMMUNITY)
Admit: 2018-05-07 | Discharge: 2018-05-07 | Disposition: A | Discharge status: Home / Self Care | Payer: Medicaid Other | Attending: Neurology | Admitting: Neurology

## 2018-05-07 ENCOUNTER — Emergency Department (HOSPITAL_COMMUNITY): Payer: Medicaid Other

## 2018-05-07 ENCOUNTER — Encounter (HOSPITAL_COMMUNITY): Payer: Self-pay

## 2018-05-07 DIAGNOSIS — G40901 Epilepsy, unspecified, not intractable, with status epilepticus: Secondary | ICD-10-CM | POA: Diagnosis not present

## 2018-05-07 DIAGNOSIS — J181 Lobar pneumonia, unspecified organism: Secondary | ICD-10-CM | POA: Diagnosis present

## 2018-05-07 DIAGNOSIS — R739 Hyperglycemia, unspecified: Secondary | ICD-10-CM | POA: Insufficient documentation

## 2018-05-07 DIAGNOSIS — E11649 Type 2 diabetes mellitus with hypoglycemia without coma: Secondary | ICD-10-CM | POA: Diagnosis not present

## 2018-05-07 DIAGNOSIS — I952 Hypotension due to drugs: Secondary | ICD-10-CM | POA: Diagnosis not present

## 2018-05-07 DIAGNOSIS — R1312 Dysphagia, oropharyngeal phase: Secondary | ICD-10-CM | POA: Diagnosis not present

## 2018-05-07 DIAGNOSIS — Z6841 Body Mass Index (BMI) 40.0 and over, adult: Secondary | ICD-10-CM | POA: Diagnosis not present

## 2018-05-07 DIAGNOSIS — E872 Acidosis: Secondary | ICD-10-CM | POA: Diagnosis present

## 2018-05-07 DIAGNOSIS — R0602 Shortness of breath: Secondary | ICD-10-CM | POA: Diagnosis not present

## 2018-05-07 DIAGNOSIS — G2409 Other drug induced dystonia: Secondary | ICD-10-CM | POA: Diagnosis present

## 2018-05-07 DIAGNOSIS — R519 Headache, unspecified: Secondary | ICD-10-CM | POA: Diagnosis present

## 2018-05-07 DIAGNOSIS — T423X5A Adverse effect of barbiturates, initial encounter: Secondary | ICD-10-CM | POA: Diagnosis not present

## 2018-05-07 DIAGNOSIS — T41295A Adverse effect of other general anesthetics, initial encounter: Secondary | ICD-10-CM | POA: Diagnosis not present

## 2018-05-07 DIAGNOSIS — R42 Dizziness and giddiness: Secondary | ICD-10-CM | POA: Diagnosis not present

## 2018-05-07 DIAGNOSIS — G40201 Localization-related (focal) (partial) symptomatic epilepsy and epileptic syndromes with complex partial seizures, not intractable, with status epilepticus: Secondary | ICD-10-CM | POA: Diagnosis not present

## 2018-05-07 DIAGNOSIS — G9341 Metabolic encephalopathy: Secondary | ICD-10-CM | POA: Diagnosis not present

## 2018-05-07 DIAGNOSIS — H518 Other specified disorders of binocular movement: Secondary | ICD-10-CM | POA: Diagnosis present

## 2018-05-07 DIAGNOSIS — E878 Other disorders of electrolyte and fluid balance, not elsewhere classified: Secondary | ICD-10-CM | POA: Diagnosis not present

## 2018-05-07 DIAGNOSIS — N183 Chronic kidney disease, stage 3 (moderate): Secondary | ICD-10-CM | POA: Diagnosis not present

## 2018-05-07 DIAGNOSIS — J9601 Acute respiratory failure with hypoxia: Secondary | ICD-10-CM | POA: Diagnosis not present

## 2018-05-07 DIAGNOSIS — J984 Other disorders of lung: Secondary | ICD-10-CM | POA: Diagnosis not present

## 2018-05-07 DIAGNOSIS — E1142 Type 2 diabetes mellitus with diabetic polyneuropathy: Secondary | ICD-10-CM | POA: Diagnosis not present

## 2018-05-07 DIAGNOSIS — R51 Headache: Secondary | ICD-10-CM | POA: Diagnosis not present

## 2018-05-07 DIAGNOSIS — R29810 Facial weakness: Secondary | ICD-10-CM | POA: Diagnosis present

## 2018-05-07 DIAGNOSIS — G8384 Todd's paralysis (postepileptic): Secondary | ICD-10-CM | POA: Diagnosis present

## 2018-05-07 DIAGNOSIS — K219 Gastro-esophageal reflux disease without esophagitis: Secondary | ICD-10-CM | POA: Diagnosis present

## 2018-05-07 DIAGNOSIS — R05 Cough: Secondary | ICD-10-CM | POA: Diagnosis not present

## 2018-05-07 DIAGNOSIS — G92 Toxic encephalopathy: Secondary | ICD-10-CM | POA: Diagnosis not present

## 2018-05-07 DIAGNOSIS — R1319 Other dysphagia: Secondary | ICD-10-CM | POA: Diagnosis not present

## 2018-05-07 DIAGNOSIS — E871 Hypo-osmolality and hyponatremia: Secondary | ICD-10-CM | POA: Diagnosis present

## 2018-05-07 DIAGNOSIS — R4781 Slurred speech: Secondary | ICD-10-CM | POA: Diagnosis present

## 2018-05-07 DIAGNOSIS — G111 Early-onset cerebellar ataxia: Secondary | ICD-10-CM | POA: Diagnosis not present

## 2018-05-07 DIAGNOSIS — I340 Nonrheumatic mitral (valve) insufficiency: Secondary | ICD-10-CM | POA: Diagnosis not present

## 2018-05-07 DIAGNOSIS — I441 Atrioventricular block, second degree: Secondary | ICD-10-CM | POA: Diagnosis not present

## 2018-05-07 DIAGNOSIS — G40909 Epilepsy, unspecified, not intractable, without status epilepticus: Secondary | ICD-10-CM | POA: Diagnosis not present

## 2018-05-07 DIAGNOSIS — Z4682 Encounter for fitting and adjustment of non-vascular catheter: Secondary | ICD-10-CM | POA: Diagnosis not present

## 2018-05-07 DIAGNOSIS — H532 Diplopia: Secondary | ICD-10-CM | POA: Diagnosis present

## 2018-05-07 DIAGNOSIS — Z452 Encounter for adjustment and management of vascular access device: Secondary | ICD-10-CM | POA: Diagnosis not present

## 2018-05-07 DIAGNOSIS — R918 Other nonspecific abnormal finding of lung field: Secondary | ICD-10-CM | POA: Diagnosis not present

## 2018-05-07 DIAGNOSIS — G40211 Localization-related (focal) (partial) symptomatic epilepsy and epileptic syndromes with complex partial seizures, intractable, with status epilepticus: Secondary | ICD-10-CM | POA: Diagnosis not present

## 2018-05-07 DIAGNOSIS — R0689 Other abnormalities of breathing: Secondary | ICD-10-CM | POA: Diagnosis not present

## 2018-05-07 DIAGNOSIS — Y92230 Patient room in hospital as the place of occurrence of the external cause: Secondary | ICD-10-CM | POA: Diagnosis not present

## 2018-05-07 DIAGNOSIS — I472 Ventricular tachycardia: Secondary | ICD-10-CM | POA: Diagnosis not present

## 2018-05-07 DIAGNOSIS — E1122 Type 2 diabetes mellitus with diabetic chronic kidney disease: Secondary | ICD-10-CM | POA: Diagnosis not present

## 2018-05-07 DIAGNOSIS — R569 Unspecified convulsions: Secondary | ICD-10-CM | POA: Diagnosis not present

## 2018-05-07 DIAGNOSIS — E1165 Type 2 diabetes mellitus with hyperglycemia: Secondary | ICD-10-CM | POA: Diagnosis not present

## 2018-05-07 LAB — GLUCOSE, CAPILLARY
Glucose-Capillary: 244 mg/dL — ABNORMAL HIGH (ref 70–99)
Glucose-Capillary: 206 mg/dL — ABNORMAL HIGH (ref 70–99)
Glucose-Capillary: 300 mg/dL — ABNORMAL HIGH (ref 70–99)
Glucose-Capillary: 201 mg/dL — ABNORMAL HIGH (ref 70–99)
Glucose-Capillary: 189 mg/dL — ABNORMAL HIGH (ref 70–99)

## 2018-05-07 LAB — RAPID URINE DRUG SCREEN, HOSP PERFORMED
Amphetamines: NOT DETECTED
BENZODIAZEPINES: NOT DETECTED
Barbiturates: NOT DETECTED
Cocaine: NOT DETECTED
Opiates: NOT DETECTED
Tetrahydrocannabinol: NOT DETECTED

## 2018-05-07 LAB — BASIC METABOLIC PANEL
Anion gap: 8 (ref 5–15)
BUN: 14 mg/dL (ref 6–20)
CO2: 20 mmol/L — ABNORMAL LOW (ref 22–32)
Calcium: 9.1 mg/dL (ref 8.9–10.3)
Chloride: 103 mmol/L (ref 98–111)
Creatinine, Ser: 1.53 mg/dL — ABNORMAL HIGH (ref 0.44–1.00)
GFR calc Af Amer: 46 mL/min — ABNORMAL LOW (ref >60)
GFR calc non Af Amer: 40 mL/min — ABNORMAL LOW (ref >60)
Glucose, Bld: 274 mg/dL — ABNORMAL HIGH (ref 70–99)
Potassium: 3.7 mmol/L (ref 3.5–5.1)
Sodium: 131 mmol/L — ABNORMAL LOW (ref 135–145)

## 2018-05-07 LAB — SEDIMENTATION RATE: Sed Rate: 21 mm/hr (ref 0–22)

## 2018-05-07 LAB — C-REACTIVE PROTEIN: CRP: 1 mg/dL — ABNORMAL HIGH (ref <1.0)

## 2018-05-07 LAB — HIV ANTIBODY (ROUTINE TESTING W REFLEX): HIV Screen 4th Generation wRfx: NONREACTIVE

## 2018-05-07 LAB — CBG MONITORING, ED: Glucose-Capillary: 249 mg/dL — ABNORMAL HIGH (ref 70–99)

## 2018-05-07 MED ORDER — INSULIN GLARGINE 100 UNIT/ML ~~LOC~~ SOLN
10.0000 [IU] | Freq: Every day | SUBCUTANEOUS | Status: DC
  Administered 2018-05-08 – 2018-05-14 (×7): 10 [IU] via SUBCUTANEOUS
  Filled 2018-05-07 (×7): qty 0.1

## 2018-05-07 MED ORDER — LEVETIRACETAM IN NACL 500 MG/100ML IV SOLN
500.0000 mg | Freq: Two times a day (BID) | INTRAVENOUS | Status: DC
  Filled 2018-05-07: qty 100

## 2018-05-07 MED ORDER — IOHEXOL 300 MG/ML  SOLN: 80.0000 mL | Freq: Once | INTRAMUSCULAR | Status: DC | PRN

## 2018-05-07 MED ORDER — FAMOTIDINE 20 MG PO TABS: 20.0000 mg | ORAL_TABLET | Freq: Two times a day (BID) | ORAL | Status: DC | PRN

## 2018-05-07 MED ORDER — IOHEXOL 350 MG/ML SOLN
100.0000 mL | Freq: Once | INTRAVENOUS | Status: AC | PRN
  Administered 2018-05-07: 80 mL via INTRAVENOUS

## 2018-05-07 MED ORDER — ONDANSETRON HCL 4 MG/2ML IJ SOLN: 4.0000 mg | Freq: Four times a day (QID) | INTRAMUSCULAR | Status: DC | PRN

## 2018-05-07 MED ORDER — MECLIZINE HCL 25 MG PO TABS: 25.0000 mg | ORAL_TABLET | Freq: Three times a day (TID) | ORAL | Status: DC | PRN

## 2018-05-07 MED ORDER — ASPIRIN-ACETAMINOPHEN-CAFFEINE 250-250-65 MG PO TABS
2.0000 | ORAL_TABLET | Freq: Four times a day (QID) | ORAL | Status: DC | PRN
  Filled 2018-05-07: qty 2

## 2018-05-07 MED ORDER — FLUTICASONE PROPIONATE 50 MCG/ACT NA SUSP
2.0000 | Freq: Every day | NASAL | Status: DC
  Administered 2018-05-07 – 2018-05-14 (×6): 2 via NASAL
  Filled 2018-05-07: qty 16

## 2018-05-07 MED ORDER — LEVALBUTEROL HCL 1.25 MG/0.5ML IN NEBU: 1.2500 mg | INHALATION_SOLUTION | Freq: Four times a day (QID) | RESPIRATORY_TRACT | Status: DC

## 2018-05-07 MED ORDER — HYDRALAZINE HCL 20 MG/ML IJ SOLN: 5.0000 mg | INTRAMUSCULAR | Status: DC | PRN

## 2018-05-07 MED ORDER — DIHYDROERGOTAMINE MESYLATE 1 MG/ML IJ SOLN
0.5000 mg | Freq: Every day | INTRAMUSCULAR | Status: DC
  Administered 2018-05-07 – 2018-05-08 (×2): 0.5 mg via INTRAVENOUS
  Filled 2018-05-07 (×2): qty 0.5

## 2018-05-07 MED ORDER — ACETAMINOPHEN 650 MG RE SUPP: 650.0000 mg | Freq: Four times a day (QID) | RECTAL | Status: DC | PRN

## 2018-05-07 MED ORDER — LORAZEPAM 2 MG/ML IJ SOLN
1.0000 mg | INTRAMUSCULAR | Status: DC | PRN
  Administered 2018-05-07 – 2018-05-08 (×2): 1 mg via INTRAVENOUS
  Administered 2018-05-09: 2 mg via INTRAVENOUS
  Administered 2018-05-09 (×2): 1 mg via INTRAVENOUS
  Filled 2018-05-07 (×8): qty 1

## 2018-05-07 MED ORDER — LEVETIRACETAM IN NACL 1500 MG/100ML IV SOLN
1500.0000 mg | Freq: Once | INTRAVENOUS | Status: AC
  Administered 2018-05-07: 1500 mg via INTRAVENOUS
  Filled 2018-05-07: qty 100

## 2018-05-07 MED ORDER — INSULIN GLARGINE 100 UNIT/ML ~~LOC~~ SOLN
5.0000 [IU] | Freq: Every day | SUBCUTANEOUS | Status: DC
  Administered 2018-05-07 (×2): 5 [IU] via SUBCUTANEOUS
  Filled 2018-05-07 (×2): qty 0.05

## 2018-05-07 MED ORDER — LEVALBUTEROL HCL 1.25 MG/0.5ML IN NEBU
1.2500 mg | INHALATION_SOLUTION | Freq: Four times a day (QID) | RESPIRATORY_TRACT | Status: DC | PRN
  Filled 2018-05-07: qty 0.5

## 2018-05-07 MED ORDER — SODIUM CHLORIDE (PF) 0.9 % IJ SOLN
INTRAMUSCULAR | Status: AC
  Filled 2018-05-07: qty 50

## 2018-05-07 MED ORDER — GABAPENTIN 100 MG PO CAPS: 100.0000 mg | ORAL_CAPSULE | Freq: Three times a day (TID) | ORAL | Status: DC | PRN

## 2018-05-07 MED ORDER — INSULIN ASPART 100 UNIT/ML ~~LOC~~ SOLN
0.0000 [IU] | Freq: Three times a day (TID) | SUBCUTANEOUS | Status: DC
  Administered 2018-05-07 (×2): 3 [IU] via SUBCUTANEOUS
  Administered 2018-05-07: 5 [IU] via SUBCUTANEOUS
  Administered 2018-05-08: 1 [IU] via SUBCUTANEOUS
  Administered 2018-05-08: 2 [IU] via SUBCUTANEOUS
  Administered 2018-05-08: 5 [IU] via SUBCUTANEOUS
  Administered 2018-05-09: 2 [IU] via SUBCUTANEOUS

## 2018-05-07 MED ORDER — SODIUM CHLORIDE 0.9 % IV SOLN
INTRAVENOUS | Status: DC
  Administered 2018-05-07 – 2018-05-09 (×5): via INTRAVENOUS

## 2018-05-07 MED ORDER — ACETAMINOPHEN 325 MG PO TABS
650.0000 mg | ORAL_TABLET | Freq: Four times a day (QID) | ORAL | Status: DC | PRN
  Administered 2018-05-07 – 2018-05-08 (×4): 650 mg via ORAL
  Filled 2018-05-07 (×4): qty 2

## 2018-05-07 MED ORDER — INSULIN ASPART 100 UNIT/ML ~~LOC~~ SOLN
0.0000 [IU] | Freq: Every day | SUBCUTANEOUS | Status: DC
  Administered 2018-05-07 – 2018-05-08 (×2): 2 [IU] via SUBCUTANEOUS

## 2018-05-07 MED ORDER — ENOXAPARIN SODIUM 60 MG/0.6ML ~~LOC~~ SOLN
60.0000 mg | Freq: Every day | SUBCUTANEOUS | Status: DC
  Administered 2018-05-07 – 2018-05-14 (×8): 60 mg via SUBCUTANEOUS
  Filled 2018-05-07 (×8): qty 0.6

## 2018-05-07 MED ORDER — VALPROATE SODIUM 500 MG/5ML IV SOLN: 500.0000 mg | Freq: Two times a day (BID) | INTRAVENOUS | Status: DC

## 2018-05-07 MED ORDER — SENNOSIDES-DOCUSATE SODIUM 8.6-50 MG PO TABS: 1.0000 | ORAL_TABLET | Freq: Every evening | ORAL | Status: DC | PRN

## 2018-05-07 MED ORDER — ONDANSETRON HCL 4 MG PO TABS: 4.0000 mg | ORAL_TABLET | Freq: Four times a day (QID) | ORAL | Status: DC | PRN

## 2018-05-07 NOTE — Procedures (Signed)
Rebecca A. Merlene Laughter, MD     [email or web address]           HISTORY: This is a 48 year old female who presents with episode of right frontal temporal headaches associated sometimes with episodic deviation of the head and eyes to one side.  The spells are worrisome for seizures.  MEDICATIONS: No current facility-administered medications for this encounter.  No current outpatient medications on file.  Facility-Administered Medications Ordered in Other Encounters:  .  0.9 %  sodium chloride infusion, , Intravenous, Continuous, Ivor Costa, MD, Last Rate: 100 mL/hr at 05/07/18 1348 .  acetaminophen (TYLENOL) tablet 650 mg, 650 mg, Oral, Q6H PRN, 650 mg at 05/07/18 1343 **OR** acetaminophen (TYLENOL) suppository 650 mg, 650 mg, Rectal, Q6H PRN, Ivor Costa, MD .  dihydroergotamine (DHE) injection 0.5 mg, 0.5 mg, Intravenous, Daily, Eshraghi, Shervin, MD, 0.5 mg at 05/07/18 1332 .  enoxaparin (LOVENOX) injection 60 mg, 60 mg, Subcutaneous, Daily, Ivor Costa, MD, 60 mg at 05/07/18 1035 .  fluticasone (FLONASE) 50 MCG/ACT nasal spray 2 spray, 2 spray, Each Nare, Daily, Ivor Costa, MD, 2 spray at 05/07/18 1036 .  gabapentin (NEURONTIN) capsule 100 mg, 100 mg, Oral, TID PRN, Ivor Costa, MD .  hydrALAZINE (APRESOLINE) injection 5 mg, 5 mg, Intravenous, Q2H PRN, Ivor Costa, MD .  insulin aspart (novoLOG) injection 0-5 Units, 0-5 Units, Subcutaneous, QHS, Ivor Costa, MD, 2 Units at 05/07/18 0354 .  insulin aspart (novoLOG) injection 0-9 Units, 0-9 Units, Subcutaneous, TID WC, Ivor Costa, MD, 5 Units at 05/07/18 1206 .  [START ON 05/08/2018] insulin glargine (LANTUS) injection 10 Units, 10 Units, Subcutaneous, Daily, Amin, Ankit Chirag, MD .  iohexol (OMNIPAQUE) 300 MG/ML solution 80 mL, 80 mL, Intravenous, Once PRN, Gareth Morgan, MD .  levalbuterol (XOPENEX) nebulizer solution 1.25 mg, 1.25 mg, Nebulization, Q6H PRN, Ivor Costa, MD .  LORazepam (ATIVAN) injection 1 mg, 1 mg,  Intravenous, Q2H PRN, Ivor Costa, MD .  ondansetron (ZOFRAN) tablet 4 mg, 4 mg, Oral, Q6H PRN **OR** ondansetron (ZOFRAN) injection 4 mg, 4 mg, Intravenous, Q6H PRN, Ivor Costa, MD .  senna-docusate (Senokot-S) tablet 1 tablet, 1 tablet, Oral, QHS PRN, Ivor Costa, MD     ANALYSIS: A 16 channel recording using standard 10 20 measurements is conducted for 27 minutes.  There is a well-formed posterior dominant rhythm of 11.5 Hz which attenuates with eye opening.  There is beta activity observed in frontal areas.  Awake and drowsy architecture are observed transitioning to extended periods of K complexes and spindles documenting stage II non-REM sleep.  Photic stimulation and hyperventilation were not carried out.  The patient had 2 spells out of stage II sleep non-REM sleep with theta slowing mostly but also with spike slow activity out of the right temporal region.  The epicenter is T6.  This rhythmic 4 to 5 Hz activity spread to the entire right hemisphere and at times the contralateral side.  Clinically, the patient was conscious throughout the 2 episodes but they were associated with deviation of the eyes to the left, some movement of the left upper extremity and discomfort on the left side.     IMPRESSION: 1.  This recording awake and sleep states shows 2 electrographic seizures emanating outs of the right temporal region and spreading mostly to the entire right side.  Given the associated semiology, this is consistent with a complex partial seizure.  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      Rebecca Johnston A. Merlene Johnston, M.D.  Diplomate, Tax adviser of Psychiatry and Neurology ( Neurology).

## 2018-05-07 NOTE — Progress Notes (Signed)
EEG completed, results pending. 

## 2018-05-07 NOTE — ED Notes (Addendum)
Unable to obtain second set of blood cultures due to patient being a difficult stick. While obtaining blood, patient's eyes shifted left and legs trembling, patient stating "my head is hurting and I feel dizzy right now." Patient able to answer orientation questions appropriately but unable to follow finger with eyes. Event lasted about 30 seconds.

## 2018-05-07 NOTE — Consult Note (Signed)
Reason for Consult:  Headache/?seizure Referring Physician: Internal medicine  Rebecca Johnston is an 48 y.o. female.  HPI: Patient is poor historian, but she says that she has had a one week history of right frontotemporal throbbing headache of currently 7/10 severity associated with nausea, dizziness, photophobia, phonophobia, and aggravation with movement.  She has had similar but milder headaches over the last few years.  She has never seen a neurologist.  CT Brain, CTA Brain/Neck, and MRI Brain were all normal.  She elevated glucose on admit at 355 with bicarb low at 18.  She has hyponatremia at 127.  She was given Reglan and Compazine for nausea and later was seen to have head and neck deviation with possible eye deviation as well, several times.  It has not happened today.  EEG is pending.  She was loaded with Keppra and is on Keppra maintenance therapy.  She is very drowsy from it.  UDS, ESR, and CRP are normal.    Past Medical History:  Diagnosis Date  . Anemia   . Asthma   . Diabetes mellitus without complication ([place]) 03/930  . Fibroids   . GERD (gastroesophageal reflux disease)   . History of blood transfusion Feb 2015  . Obesity, Class III, BMI 40-49.9 (morbid obesity) ([place])   . Shortness of breath dyspnea     Past Surgical History:  Procedure Laterality Date  . ABDOMINAL HYSTERECTOMY N/A 12/08/2014   Procedure: HYSTERECTOMY ABDOMINAL;  Surgeon: Woodroe Mode, MD;  Location: [facility];  Service: Gynecology;  Laterality: N/A;  . DILATION AND CURETTAGE OF UTERUS    . DILITATION & CURRETTAGE/HYSTROSCOPY WITH VERSAPOINT RESECTION N/A 01/06/2014   Procedure: Corena Pilgrim WITH VERSAPOINT;  Surgeon: Woodroe Mode, MD;  Location: [facility];  Service: Gynecology;  Laterality: N/A;  . LAPAROSCOPIC LYSIS OF ADHESIONS N/A 12/08/2014   Procedure: LAPAROSCOPIC LYSIS OF ADHESIONS;  Surgeon: Michael Boston, MD;  Location: [facility];  Service: General;  Laterality: N/A;  . OVARIAN CYST REMOVAL     cyst not  removed. Cyst drained  . SUPRA-UMBILICAL HERNIA N/A 35/07/7320   Procedure: LAPARSCOPIC SUPRA-UMBILICAL HERNIA REPAIR ;  Surgeon: Michael Boston, MD;  Location: [facility];  Service: General;  Laterality: N/A;  . VENTRAL HERNIA REPAIR N/A 12/08/2014   Procedure: LAPAROSCOPIC INCARCERATED INCISIONAL VENTRAL WALL HERNIA REPAIR;  Surgeon: Michael Boston, MD;  Location: [facility];  Service: General;  Laterality: N/A;    Family History  Problem Relation Age of Onset  . Hypertension Mother   . Diabetes Father   . Breast cancer Maternal Aunt   . Diabetes Maternal Aunt   . Diabetes Paternal Aunt   . Diabetes Maternal Aunt   . Diabetes Maternal Aunt     Social History:  reports that she quit smoking about 2 years ago. Her smoking use included cigarettes. She has a 6.25 pack-year smoking history. She has never used smokeless tobacco. She reports current alcohol use. She reports that she does not use drugs.  Allergies:  Allergies  Allergen Reactions  . Doxycycline     "bad dreaming, hallucination, dizziness" per pt    Prior to Admission medications   Medication Sig Start Date End Date Taking? Authorizing Provider  aspirin-acetaminophen-caffeine (EXCEDRIN MIGRAINE) [phone] MG tablet Take 2 tablets by mouth every 6 (six) hours as needed for headache.   Yes [provider]  cetirizine (ZYRTEC) 10 MG tablet Take 1 tablet (10 mg total) by mouth daily. Patient taking differently: Take 10 mg by mouth daily as  needed for allergies.  04/16/17  Yes Ladell Pier, MD  doxycycline (VIBRAMYCIN) 100 MG capsule Take 1 capsule (100 mg total) by mouth 2 (two) times daily. 04/29/18  Yes Maudie Flakes, MD  fluticasone (FLONASE) 50 MCG/ACT nasal spray USE 2 SPRAY(S) IN EACH NOSTRIL ONCE DAILY Patient taking differently: Place 2 sprays into both nostrils daily.  04/26/18  Yes Ladell Pier, MD  gabapentin (NEURONTIN) 100 MG capsule Take 100 mg by mouth 1-3 times daily as needed for nerve pains Patient  taking differently: Take 100 mg by mouth 3 (three) times daily as needed (nerve pain).  04/04/16  Yes Funches, Josalyn, MD  glimepiride (AMARYL) 2 MG tablet Take 1 tablet (2 mg total) by mouth daily before breakfast. MUST MAKE APPT FOR FURTHER REFILLS 04/26/18  Yes Ladell Pier, MD  ranitidine (ZANTAC) 300 MG tablet 1 tab PO QHS PRN Patient taking differently: Take 300 mg by mouth at bedtime as needed for heartburn.  04/16/17  Yes Ladell Pier, MD  ACCU-CHEK SOFTCLIX LANCETS lancets 1 each by Other route 2 (two) times daily. 12/27/15   Funches, Adriana Mccallum, MD  Blood Glucose Monitoring Suppl (ACCU-CHEK AVIVA PLUS) w/Device KIT 1 Device by Does not apply route 2 (two) times daily. 01/02/17   Tresa Garter, MD  glucose blood (ACCU-CHEK AVIVA PLUS) test strip 1 each by Other route 2 (two) times daily. MUST MAKE APPT FOR FURTHER REFILLS 04/26/18   Ladell Pier, MD  mupirocin ointment (BACTROBAN) 2 % Apply 1 application topically 2 (two) times daily. Patient not taking: Reported on 04/29/2018 04/22/17   Ladell Pier, MD  [facility] HFA 108 [phone] Base) MCG/ACT inhaler Inhale 2 puffs into the lungs every 6 (six) hours as needed for wheezing or shortness of breath. Patient not taking: Reported on 05/06/2018 04/17/17   Ladell Pier, MD    Medications:  Scheduled: . dihydroergotamine  0.5 mg Intravenous Daily  . enoxaparin (LOVENOX) injection  60 mg Subcutaneous Daily  . fluticasone  2 spray Each Nare Daily  . insulin aspart  0-5 Units Subcutaneous QHS  . insulin aspart  0-9 Units Subcutaneous TID WC  . insulin glargine  5 Units Subcutaneous Daily    Results for orders placed or performed during the hospital encounter of 05/06/18 (from the past 48 hour(s))  CBG monitoring, ED     Status: Abnormal   Collection Time: 05/06/18 12:14 PM  Result Value Ref Range   Glucose-Capillary 366 (H) 70 - 99 mg/dL  Urinalysis, Routine w reflex microscopic     Status: Abnormal   Collection Time:  05/06/18 12:16 PM  Result Value Ref Range   Color, Urine STRAW (A) YELLOW   APPearance CLEAR CLEAR   Specific Gravity, Urine 1.007 1.005 - 1.030   pH 6.0 5.0 - 8.0   Glucose, UA >=500 (A) NEGATIVE mg/dL   Hgb urine dipstick SMALL (A) NEGATIVE   Bilirubin Urine NEGATIVE NEGATIVE   Ketones, ur 5 (A) NEGATIVE mg/dL   Protein, ur NEGATIVE NEGATIVE mg/dL   Nitrite NEGATIVE NEGATIVE   Leukocytes,Ua NEGATIVE NEGATIVE   RBC / HPF 0-5 0 - 5 RBC/hpf   WBC, UA 0-5 0 - 5 WBC/hpf   Bacteria, UA RARE (A) NONE SEEN   Squamous Epithelial / LPF 0-5 0 - 5   Mucus PRESENT     Comment: Performed at [facility], [place] [address], [place], [place] [address]  CBC with Differential     Status: Abnormal  Collection Time: 05/06/18  1:00 PM  Result Value Ref Range   WBC 11.9 (H) 4.0 - 10.5 K/uL   RBC 6.03 (H) 3.87 - 5.11 MIL/uL   Hemoglobin 12.8 12.0 - 15.0 g/dL   HCT 42.6 36.0 - 46.0 %   MCV 70.6 (L) 80.0 - 100.0 fL   MCH 21.2 (L) 26.0 - 34.0 pg   MCHC 30.0 30.0 - 36.0 g/dL   RDW 14.6 11.5 - 15.5 %   Platelets 324 150 - 400 K/uL   nRBC 0.0 0.0 - 0.2 %   Neutrophils Relative % 79 %   Neutro Abs 9.4 (H) 1.7 - 7.7 K/uL   Lymphocytes Relative 12 %   Lymphs Abs 1.4 0.7 - 4.0 K/uL   Monocytes Relative 6 %   Monocytes Absolute 0.7 0.1 - 1.0 K/uL   Eosinophils Relative 0 %   Eosinophils Absolute 0.0 0.0 - 0.5 K/uL   Basophils Relative 0 %   Basophils Absolute 0.0 0.0 - 0.1 K/uL   Immature Granulocytes 3 %   Abs Immature Granulocytes 0.30 (H) 0.00 - 0.07 K/uL    Comment: Performed at [facility], [place] [address] [place], [place] [address]  Basic metabolic panel     Status: Abnormal   Collection Time: 05/06/18  1:00 PM  Result Value Ref Range   Sodium 127 (L) 135 - 145 mmol/L   Potassium 4.4 3.5 - 5.1 mmol/L   Chloride 97 (L) 98 - 111 mmol/L   CO2 18 (L) 22 - 32 mmol/L   Glucose, Bld 355 (H) 70 - 99 mg/dL   BUN 15 6 - 20 mg/dL   Creatinine, Ser 1.54 (H) 0.44 -  1.00 mg/dL   Calcium 9.7 8.9 - 10.3 mg/dL   GFR calc non Af Amer 39 (L) >60 mL/min   GFR calc Af Amer 46 (L) >60 mL/min   Anion gap 12 5 - 15    Comment: Performed at [facility], [place] [address] [place], [place] [address]  Hemoglobin A1c     Status: Abnormal   Collection Time: 05/06/18  1:00 PM  Result Value Ref Range   Hgb A1c MFr Bld 14.9 (H) 4.8 - 5.6 %    Comment: (NOTE) Pre diabetes:          5.7%-6.4% Diabetes:              >6.4% Glycemic control for   <7.0% adults with diabetes    Mean Plasma Glucose 380.93 mg/dL    Comment: Performed at [facility] [address] [place], [place] [address]  POC CBG, ED     Status: Abnormal   Collection Time: 05/06/18  2:01 PM  Result Value Ref Range   Glucose-Capillary 314 (H) 70 - 99 mg/dL  POC CBG, ED     Status: Abnormal   Collection Time: 05/06/18  8:09 PM  Result Value Ref Range   Glucose-Capillary 250 (H) 70 - 99 mg/dL   Comment 1 Notify RN    Comment 2 Document in Chart    Comment 3 Call MD NNP PA CNM   POC CBG, ED     Status: Abnormal   Collection Time: 05/06/18 10:12 PM  Result Value Ref Range   Glucose-Capillary 258 (H) 70 - 99 mg/dL   Comment 1 Notify RN    Comment 2 Document in Chart    Comment 3 Call MD NNP PA CNM   CBG monitoring, ED  Status: Abnormal   Collection Time: 05/07/18  1:40 AM  Result Value Ref Range   Glucose-Capillary 249 (H) 70 - 99 mg/dL  C-reactive protein     Status: Abnormal   Collection Time: 05/07/18  2:00 AM  Result Value Ref Range   CRP 1.0 (H) <1.0 mg/dL    Comment: Performed at [facility], [place] [address] [place], [place] [address]  Sedimentation rate     Status: None   Collection Time: 05/07/18  2:00 AM  Result Value Ref Range   Sed Rate 21 0 - 22 mm/hr    Comment: Performed at [facility], [place] [address], [place], [place] [address]  Glucose, capillary     Status: Abnormal   Collection Time: 05/07/18  3:08 AM   Result Value Ref Range   Glucose-Capillary 244 (H) 70 - 99 mg/dL  Culture, blood (Routine X 2) w Reflex to ID Panel     Status: None (Preliminary result)   Collection Time: 05/07/18  3:31 AM  Result Value Ref Range   Specimen Description      BLOOD LEFT HAND Performed at [facility], [place] [address], [place], [place] [address]    Special Requests      BOTTLES DRAWN AEROBIC AND ANAEROBIC Blood Culture adequate volume Performed at [place] [address] [place], [place] [address]    Culture PENDING    Report Status PENDING   Glucose, capillary     Status: Abnormal   Collection Time: 05/07/18  7:21 AM  Result Value Ref Range   Glucose-Capillary 206 (H) 70 - 99 mg/dL  Urine rapid drug screen (hosp performed)     Status: None   Collection Time: 05/07/18  8:06 AM  Result Value Ref Range   Opiates NONE DETECTED NONE DETECTED   Cocaine NONE DETECTED NONE DETECTED   Benzodiazepines NONE DETECTED NONE DETECTED   Amphetamines NONE DETECTED NONE DETECTED   Tetrahydrocannabinol NONE DETECTED NONE DETECTED   Barbiturates NONE DETECTED NONE DETECTED    Comment: (NOTE) DRUG SCREEN FOR MEDICAL PURPOSES ONLY.  IF CONFIRMATION IS NEEDED FOR ANY PURPOSE, NOTIFY LAB WITHIN 5 DAYS. LOWEST DETECTABLE LIMITS FOR URINE DRUG SCREEN Drug Class                     Cutoff (ng/mL) Amphetamine and metabolites    1000 Barbiturate and metabolites    200 Benzodiazepine                 619 Tricyclics and metabolites     300 Opiates and metabolites        300 Cocaine and metabolites        300 THC                            50 Performed at [facility], [place] [address], [place], [place] [address]     Ct Angio Head W Or Wo Contrast  Result Date: 05/07/2018 CLINICAL DATA:  RIGHT-sided headache. Symptoms worse after starting antibiotics for pneumonia. Dizziness. History of diabetes. EXAM: CT ANGIOGRAPHY HEAD AND NECK TECHNIQUE: Multidetector CT imaging of  the head and neck was performed using the standard protocol during bolus administration of intravenous contrast. Multiplanar CT image reconstructions and MIPs were obtained to evaluate the vascular anatomy. Carotid stenosis measurements (when applicable) are obtained utilizing NASCET criteria, using the distal internal carotid diameter as the denominator.  CONTRAST:  60m OMNIPAQUE IOHEXOL 350 MG/ML SOLN COMPARISON:  MRI head May 06, 2018 FINDINGS: CT HEAD FINDINGS BRAIN: No intraparenchymal hemorrhage, mass effect nor midline shift. The ventricles and sulci are normal. No acute large vascular territory infarcts. No abnormal extra-axial fluid collections. Basal cisterns are patent. VASCULAR: Unremarkable. SKULL/SOFT TISSUES: No skull fracture. No significant soft tissue swelling. ORBITS/SINUSES: The included ocular globes and orbital contents are normal.Trace paranasal sinus mucosal thickening. Mastoid air cells are well aerated. OTHER: None. CTA NECK FINDINGS: AORTIC ARCH: Normal appearance of the thoracic arch, normal branch pattern. The origins of the innominate, left Common carotid artery and subclavian artery are patent. RIGHT CAROTID SYSTEM: Common carotid artery is patent, medial course. Normal appearance of the carotid bifurcation without hemodynamically significant stenosis by NASCET criteria. Normal appearance of the internal carotid artery. LEFT CAROTID SYSTEM: Common carotid artery is patent, medial course. Normal appearance of the carotid bifurcation without hemodynamically significant stenosis by NASCET criteria. Normal appearance of the internal carotid artery. VERTEBRAL ARTERIES:Codominant patent vertebral arteries. SKELETON: No acute osseous process though bone windows have not been submitted. Mild cervical spondylosis. OTHER NECK: Soft tissues of the neck are nonacute though, not tailored for evaluation. UPPER CHEST: Included lung apices are clear. No superior mediastinal lymphadenopathy. CTA HEAD  FINDINGS: ANTERIOR CIRCULATION: Patent cervical internal carotid arteries, petrous, cavernous and supra clinoid internal carotid arteries. Patent anterior communicating artery. Patent anterior and middle cerebral arteries. No large vessel occlusion, flow-limiting stenosis, contrast extravasation or aneurysm. POSTERIOR CIRCULATION: Patent vertebral arteries, vertebrobasilar junction and basilar artery, as well as main branch vessels. Patent posterior cerebral arteries. Small bilateral posterior communicating arteries present. Chest No large vessel occlusion, flow-limiting stenosis, contrast extravasation or aneurysm. VENOUS SINUSES: Major dural venous sinuses are patent though not tailored for evaluation on this angiographic examination. ANATOMIC VARIANTS: None. DELAYED PHASE: Not performed. MIP images reviewed. IMPRESSION: 1. Normal CT HEAD with and without contrast. 2. Normal CTA HEAD. 3. Normal CTA NECK. Electronically Signed   By: CElon AlasM.D.   On: 05/07/2018 00:50   Dg Chest 2 View  Result Date: 05/06/2018 CLINICAL DATA:  Headache, cough, recent history of pneumonia. EXAM: CHEST - 2 VIEW COMPARISON:  Chest x-rays dated 04/29/2018 and 04/27/2013. FINDINGS: Study is hypoinspiratory with crowding of the perihilar and bibasilar bronchovascular markings. Given the low lung volumes, lungs appear clear. No evidence of consolidating pneumonia. No pleural effusion or pneumothorax seen. Heart size and mediastinal contours are within normal limits. No acute or suspicious osseous finding. IMPRESSION: Low lung volumes. No active cardiopulmonary disease. No evidence of pneumonia. Electronically Signed   By: SFranki CabotM.D.   On: 05/06/2018 13:58   Ct Angio Neck W And/or Wo Contrast  Result Date: 05/07/2018 CLINICAL DATA:  RIGHT-sided headache. Symptoms worse after starting antibiotics for pneumonia. Dizziness. History of diabetes. EXAM: CT ANGIOGRAPHY HEAD AND NECK TECHNIQUE: Multidetector CT imaging of  the head and neck was performed using the standard protocol during bolus administration of intravenous contrast. Multiplanar CT image reconstructions and MIPs were obtained to evaluate the vascular anatomy. Carotid stenosis measurements (when applicable) are obtained utilizing NASCET criteria, using the distal internal carotid diameter as the denominator. CONTRAST:  866mOMNIPAQUE IOHEXOL 350 MG/ML SOLN COMPARISON:  MRI head May 06, 2018 FINDINGS: CT HEAD FINDINGS BRAIN: No intraparenchymal hemorrhage, mass effect nor midline shift. The ventricles and sulci are normal. No acute large vascular territory infarcts. No abnormal extra-axial fluid collections. Basal cisterns are patent. VASCULAR: Unremarkable. SKULL/SOFT TISSUES: No skull fracture.  No significant soft tissue swelling. ORBITS/SINUSES: The included ocular globes and orbital contents are normal.Trace paranasal sinus mucosal thickening. Mastoid air cells are well aerated. OTHER: None. CTA NECK FINDINGS: AORTIC ARCH: Normal appearance of the thoracic arch, normal branch pattern. The origins of the innominate, left Common carotid artery and subclavian artery are patent. RIGHT CAROTID SYSTEM: Common carotid artery is patent, medial course. Normal appearance of the carotid bifurcation without hemodynamically significant stenosis by NASCET criteria. Normal appearance of the internal carotid artery. LEFT CAROTID SYSTEM: Common carotid artery is patent, medial course. Normal appearance of the carotid bifurcation without hemodynamically significant stenosis by NASCET criteria. Normal appearance of the internal carotid artery. VERTEBRAL ARTERIES:Codominant patent vertebral arteries. SKELETON: No acute osseous process though bone windows have not been submitted. Mild cervical spondylosis. OTHER NECK: Soft tissues of the neck are nonacute though, not tailored for evaluation. UPPER CHEST: Included lung apices are clear. No superior mediastinal lymphadenopathy. CTA HEAD  FINDINGS: ANTERIOR CIRCULATION: Patent cervical internal carotid arteries, petrous, cavernous and supra clinoid internal carotid arteries. Patent anterior communicating artery. Patent anterior and middle cerebral arteries. No large vessel occlusion, flow-limiting stenosis, contrast extravasation or aneurysm. POSTERIOR CIRCULATION: Patent vertebral arteries, vertebrobasilar junction and basilar artery, as well as main branch vessels. Patent posterior cerebral arteries. Small bilateral posterior communicating arteries present. Chest No large vessel occlusion, flow-limiting stenosis, contrast extravasation or aneurysm. VENOUS SINUSES: Major dural venous sinuses are patent though not tailored for evaluation on this angiographic examination. ANATOMIC VARIANTS: None. DELAYED PHASE: Not performed. MIP images reviewed. IMPRESSION: 1. Normal CT HEAD with and without contrast. 2. Normal CTA HEAD. 3. Normal CTA NECK. Electronically Signed   By: Elon Alas M.D.   On: 05/07/2018 00:50   Mr Brain Wo Contrast  Result Date: 05/06/2018 CLINICAL DATA:  48 y/o  F; worsening right-sided headache. EXAM: MRI HEAD WITHOUT CONTRAST TECHNIQUE: Axial DWI, axial T2, axial T2 propeller sequences were acquired. COMPARISON:  05/03/2018 CT head FINDINGS: Brain: No reduced diffusion to suggest acute or early subacute infarction. No abnormal T2 or T2 FLAIR weighted signal of the brain. No mass effect, gross extra-axial collection, hydrocephalus, or herniation is evident. Vascular: Normal T2 flow voids. Skull and upper cervical spine: Normal T2 signal. Sinuses/Orbits: Negative. Other: Partially visualized left larger than right prominent parotid gland ducts. IMPRESSION: 1. Limited sequences. 2. No acute stroke, hemorrhage, or mass effect identified. 3. Partially visualized left larger than right prominent parotid gland ducts, possibly physiologic or related to downstream obstruction. Electronically Signed   By: Kristine Garbe  M.D.   On: 05/06/2018 18:27    ROS Blood pressure (!) 143/94, pulse 93, temperature 98.7 F (37.1 C), temperature source Oral, resp. rate 16, height _0  (1.651 m), weight 118.4 kg, last menstrual period 11/26/2014, SpO2 97 %. Neurologic Examination:  Sleepy, but arousable.  She is fully oriented.  Language is fluent; Comprehension, naming, repetition- intact. PERL, EOMI, face symmetrical.  Tongue is midline. Strength 5/5 BUE and BLE. Coord intact to FTN bilaterally. No Brudzinski or Kernig's signs. No babinski.  No hoffman's. Sensory- withdraws to pain all 4 extremities.   Assessment/Plan:  1. I suspect she may have had a dystonic reaction to Reglan and/or Compazine and/or Meclizine.  I doubt she had a seizure.  Awaiting EEG.  She is now on Zofran prn which should not cause that as it is 5HT3 antagonist and has no dopamine blocking effects.  I will discontinue the Keppra.   2. Status migrainosus -  She needs more  effective treatment for this than Excedrin Migraine.  I will discontinue that.  I will give her IV DHE 0.5 mg qd for 3 days.  She can use Tylenol prn, because I have to avoid NSAIDs due to renal insufficiency.      Isla Pence,  MD 05/07/2018, 11:36 AM

## 2018-05-07 NOTE — Progress Notes (Signed)
Pt had 30 beats of V tach within 1 minute, with the largest run being 6 beats. This RN checked on pt and the pt was asleep. Upon arousal, pt was A&O x 4 and had no complaints. Will continue to monitor the pt closely.

## 2018-05-07 NOTE — Progress Notes (Signed)
PROGRESS NOTE    SHERIECE JEFCOAT  BMW:[id number] DOB: 04/14/1970 DOA: 05/06/2018 PCP: Ladell Pier, MD   Brief Narrative:  48 year old with history of CKD stage III, obesity, diabetes mellitus type 2, asthma came to the hospital with complains of headache, blurry vision and dizziness.  She also reported of slurred speech and facial droop.  CT of the head, CTA of the head and neck, MRI of the brain-were negative.  UDS, ESR and CRP were negative.  EEG was ordered.  There was concern the patient may have had a seizure-like activity overnight therefore started on Keppra but discontinued the following day.   Assessment & Plan:   Principal Problem:   Headache Active Problems:   Type II diabetes mellitus with renal manifestations (HCC)   CKD (chronic kidney disease), stage III (HCC)   Lobar pneumonia (HCC)   GERD (gastroesophageal reflux disease)   Hyponatremia  Headache/blurry vision-improved Dystonia secondary to medications - Will discontinue Reglan, Compazine and meclizine.  Neurology input appreciated. - CT of the head, CTA head and neck, MRI brain-negative - UDS, ESR and CRP-negative -EEG-pending -Supportive care - Neurochecks  Headaches concerns for migraines - Continue Tylenol as needed.  IV DHE prescribed by neurology.  Avoid NSAIDs.  Hyponatremia - Concern for pseudohyponatremia in the setting of hyperglycemia.  Will repeat again  Uncontrolled diabetes mellitus type 2 - Hemoglobin A1c is now greater than 14.  Diabetic coordinator consulted. Increase Lantus to 10 units daily -Insulin sliding scale Accu-Chek.  CKD stage III -Creatinine appears to be stable.  Continue to monitor  GERD -Pepcid  Patient lives alone by herself.  I do not believe she will be able to care for herself.  Will get PT/OT to see her and make further arrangements for help as necessary  DVT prophylaxis: Lovenox Code Status: Full code Family Communication: None at bedside Disposition Plan:  Maintain inpatient stay until cleared by neurology in the meantime getting PT/OT evaluation  Consultants:  Neurology  Procedures:   None  Antimicrobials:   None   Subjective: Apparently patient had seizure-like activities overnight therefore was loaded with Keppra and started on IV Keppra.  This morning when I saw the patient at bedside she was having mild muscle contraction and looking sideways in one position but she was able to carry on a basic conversation without any issues and answer me all the questions.  She denied any complaints.  She was easily able to make eye contact with me and move all 4 extremities.  Review of Systems Otherwise negative except as per HPI, including: General: Denies fever, chills, night sweats or unintended weight loss. Resp: Denies cough, wheezing, shortness of breath. Cardiac: Denies chest pain, palpitations, orthopnea, paroxysmal nocturnal dyspnea. GI: Denies abdominal pain, nausea, vomiting, diarrhea or constipation GU: Denies dysuria, frequency, hesitancy or incontinence MS: Denies muscle aches, joint pain or swelling Neuro: Denies headache, neurologic deficits (focal weakness, numbness, tingling), abnormal gait Psych: Denies anxiety, depression, SI/HI/AVH Skin: Denies new rashes or lesions ID: Denies sick contacts, exotic exposures, travel  Objective: Vitals:   05/07/18 0255 05/07/18 0307 05/07/18 0346 05/07/18 1323  BP: (!) 143/94   113/72  Pulse: 93   99  Resp: 16   16  Temp: 99.3 F (37.4 C)  98.7 F (37.1 C) 99.2 F (37.3 C)  TempSrc: Oral  Oral Oral  SpO2: 97%   99%  Weight:  118.4 kg    Height:  '5\' 5"'  (1.651 m)      Intake/Output Summary (  Last 24 hours) at 05/07/2018 1338 Last data filed at 05/07/2018 1337 Gross per 24 hour  Intake 2753.9 ml  Output -  Net 2753.9 ml   Filed Weights   05/07/18 0307  Weight: 118.4 kg    Examination:  General exam: Appears calm and comfortable  Respiratory system: Clear to auscultation.  Respiratory effort normal. Cardiovascular system: S1 & S2 heard, RRR. No JVD, murmurs, rubs, gallops or clicks. No pedal edema. Gastrointestinal system: Abdomen is nondistended, soft and nontender. No organomegaly or masses felt. Normal bowel sounds heard. Central nervous system: Alert and oriented.  Mild dystonia noted extremities: Symmetric 4 x 5 power. Skin: No rashes, lesions or ulcers Psychiatry: Judgement and insight appear normal. Mood & affect appropriate.   Data Reviewed:   CBC: Recent Labs  Lab 05/03/18 1303 05/06/18 1300  WBC 9.1 11.9*  NEUTROABS 6.3 9.4*  HGB 14.0 12.8  HCT 46.1* 42.6  MCV 70.1* 70.6*  PLT 341 979   Basic Metabolic Panel: Recent Labs  Lab 05/03/18 1303 05/06/18 1300  NA 130* 127*  K 4.3 4.4  CL 101 97*  CO2 16* 18*  GLUCOSE 355* 355*  BUN 18 15  CREATININE 1.71* 1.54*  CALCIUM 9.8 9.7   GFR: Estimated Creatinine Clearance: 57.5 mL/min (A) (by C-G formula based on SCr of 1.54 mg/dL (H)). Liver Function Tests: No results for input(s): AST, ALT, ALKPHOS, BILITOT, PROT, ALBUMIN in the last 168 hours. No results for input(s): LIPASE, AMYLASE in the last 168 hours. No results for input(s): AMMONIA in the last 168 hours. Coagulation Profile: No results for input(s): INR, PROTIME in the last 168 hours. Cardiac Enzymes: No results for input(s): CKTOTAL, CKMB, CKMBINDEX, TROPONINI in the last 168 hours. BNP (last 3 results) No results for input(s): PROBNP in the last 8760 hours. HbA1C: Recent Labs    05/06/18 1300  HGBA1C 14.9*   CBG: Recent Labs  Lab 05/06/18 2212 05/07/18 0140 05/07/18 0308 05/07/18 0721 05/07/18 1134  GLUCAP 258* 249* 244* 206* 300*   Lipid Profile: No results for input(s): CHOL, HDL, LDLCALC, TRIG, CHOLHDL, LDLDIRECT in the last 72 hours. Thyroid Function Tests: No results for input(s): TSH, T4TOTAL, FREET4, T3FREE, THYROIDAB in the last 72 hours. Anemia Panel: No results for input(s): VITAMINB12, FOLATE,  FERRITIN, TIBC, IRON, RETICCTPCT in the last 72 hours. Sepsis Labs: No results for input(s): PROCALCITON, LATICACIDVEN in the last 168 hours.  Recent Results (from the past 240 hour(s))  Culture, blood (Routine X 2) w Reflex to ID Panel     Status: None (Preliminary result)   Collection Time: 05/07/18  3:31 AM  Result Value Ref Range Status   Specimen Description   Final    BLOOD LEFT HAND Performed at [facility], [address], [place], [place] [address]    Special Requests   Final    BOTTLES DRAWN AEROBIC AND ANAEROBIC Blood Culture adequate volume Performed at [facility] [address], [place], [place] [address]    Culture PENDING  Incomplete   Report Status PENDING  Incomplete         Radiology Studies: Ct Angio Head W Or Wo Contrast  Result Date: 05/07/2018 CLINICAL DATA:  RIGHT-sided headache. Symptoms worse after starting antibiotics for pneumonia. Dizziness. History of diabetes. EXAM: CT ANGIOGRAPHY HEAD AND NECK TECHNIQUE: Multidetector CT imaging of the head and neck was performed using the standard protocol during bolus administration of intravenous contrast. Multiplanar CT image reconstructions and MIPs were obtained to evaluate  the vascular anatomy. Carotid stenosis measurements (when applicable) are obtained utilizing NASCET criteria, using the distal internal carotid diameter as the denominator. CONTRAST:  69m OMNIPAQUE IOHEXOL 350 MG/ML SOLN COMPARISON:  MRI head May 06, 2018 FINDINGS: CT HEAD FINDINGS BRAIN: No intraparenchymal hemorrhage, mass effect nor midline shift. The ventricles and sulci are normal. No acute large vascular territory infarcts. No abnormal extra-axial fluid collections. Basal cisterns are patent. VASCULAR: Unremarkable. SKULL/SOFT TISSUES: No skull fracture. No significant soft tissue swelling. ORBITS/SINUSES: The included ocular globes and orbital contents are normal.Trace paranasal sinus mucosal thickening. Mastoid  air cells are well aerated. OTHER: None. CTA NECK FINDINGS: AORTIC ARCH: Normal appearance of the thoracic arch, normal branch pattern. The origins of the innominate, left Common carotid artery and subclavian artery are patent. RIGHT CAROTID SYSTEM: Common carotid artery is patent, medial course. Normal appearance of the carotid bifurcation without hemodynamically significant stenosis by NASCET criteria. Normal appearance of the internal carotid artery. LEFT CAROTID SYSTEM: Common carotid artery is patent, medial course. Normal appearance of the carotid bifurcation without hemodynamically significant stenosis by NASCET criteria. Normal appearance of the internal carotid artery. VERTEBRAL ARTERIES:Codominant patent vertebral arteries. SKELETON: No acute osseous process though bone windows have not been submitted. Mild cervical spondylosis. OTHER NECK: Soft tissues of the neck are nonacute though, not tailored for evaluation. UPPER CHEST: Included lung apices are clear. No superior mediastinal lymphadenopathy. CTA HEAD FINDINGS: ANTERIOR CIRCULATION: Patent cervical internal carotid arteries, petrous, cavernous and supra clinoid internal carotid arteries. Patent anterior communicating artery. Patent anterior and middle cerebral arteries. No large vessel occlusion, flow-limiting stenosis, contrast extravasation or aneurysm. POSTERIOR CIRCULATION: Patent vertebral arteries, vertebrobasilar junction and basilar artery, as well as main branch vessels. Patent posterior cerebral arteries. Small bilateral posterior communicating arteries present. Chest No large vessel occlusion, flow-limiting stenosis, contrast extravasation or aneurysm. VENOUS SINUSES: Major dural venous sinuses are patent though not tailored for evaluation on this angiographic examination. ANATOMIC VARIANTS: None. DELAYED PHASE: Not performed. MIP images reviewed. IMPRESSION: 1. Normal CT HEAD with and without contrast. 2. Normal CTA HEAD. 3. Normal CTA  NECK. Electronically Signed   By: CElon AlasM.D.   On: 05/07/2018 00:50   Dg Chest 2 View  Result Date: 05/06/2018 CLINICAL DATA:  Headache, cough, recent history of pneumonia. EXAM: CHEST - 2 VIEW COMPARISON:  Chest x-rays dated 04/29/2018 and 04/27/2013. FINDINGS: Study is hypoinspiratory with crowding of the perihilar and bibasilar bronchovascular markings. Given the low lung volumes, lungs appear clear. No evidence of consolidating pneumonia. No pleural effusion or pneumothorax seen. Heart size and mediastinal contours are within normal limits. No acute or suspicious osseous finding. IMPRESSION: Low lung volumes. No active cardiopulmonary disease. No evidence of pneumonia. Electronically Signed   By: SFranki CabotM.D.   On: 05/06/2018 13:58   Ct Angio Neck W And/or Wo Contrast  Result Date: 05/07/2018 CLINICAL DATA:  RIGHT-sided headache. Symptoms worse after starting antibiotics for pneumonia. Dizziness. History of diabetes. EXAM: CT ANGIOGRAPHY HEAD AND NECK TECHNIQUE: Multidetector CT imaging of the head and neck was performed using the standard protocol during bolus administration of intravenous contrast. Multiplanar CT image reconstructions and MIPs were obtained to evaluate the vascular anatomy. Carotid stenosis measurements (when applicable) are obtained utilizing NASCET criteria, using the distal internal carotid diameter as the denominator. CONTRAST:  82mOMNIPAQUE IOHEXOL 350 MG/ML SOLN COMPARISON:  MRI head May 06, 2018 FINDINGS: CT HEAD FINDINGS BRAIN: No intraparenchymal hemorrhage, mass effect nor midline shift. The ventricles and sulci are normal.  No acute large vascular territory infarcts. No abnormal extra-axial fluid collections. Basal cisterns are patent. VASCULAR: Unremarkable. SKULL/SOFT TISSUES: No skull fracture. No significant soft tissue swelling. ORBITS/SINUSES: The included ocular globes and orbital contents are normal.Trace paranasal sinus mucosal thickening. Mastoid  air cells are well aerated. OTHER: None. CTA NECK FINDINGS: AORTIC ARCH: Normal appearance of the thoracic arch, normal branch pattern. The origins of the innominate, left Common carotid artery and subclavian artery are patent. RIGHT CAROTID SYSTEM: Common carotid artery is patent, medial course. Normal appearance of the carotid bifurcation without hemodynamically significant stenosis by NASCET criteria. Normal appearance of the internal carotid artery. LEFT CAROTID SYSTEM: Common carotid artery is patent, medial course. Normal appearance of the carotid bifurcation without hemodynamically significant stenosis by NASCET criteria. Normal appearance of the internal carotid artery. VERTEBRAL ARTERIES:Codominant patent vertebral arteries. SKELETON: No acute osseous process though bone windows have not been submitted. Mild cervical spondylosis. OTHER NECK: Soft tissues of the neck are nonacute though, not tailored for evaluation. UPPER CHEST: Included lung apices are clear. No superior mediastinal lymphadenopathy. CTA HEAD FINDINGS: ANTERIOR CIRCULATION: Patent cervical internal carotid arteries, petrous, cavernous and supra clinoid internal carotid arteries. Patent anterior communicating artery. Patent anterior and middle cerebral arteries. No large vessel occlusion, flow-limiting stenosis, contrast extravasation or aneurysm. POSTERIOR CIRCULATION: Patent vertebral arteries, vertebrobasilar junction and basilar artery, as well as main branch vessels. Patent posterior cerebral arteries. Small bilateral posterior communicating arteries present. Chest No large vessel occlusion, flow-limiting stenosis, contrast extravasation or aneurysm. VENOUS SINUSES: Major dural venous sinuses are patent though not tailored for evaluation on this angiographic examination. ANATOMIC VARIANTS: None. DELAYED PHASE: Not performed. MIP images reviewed. IMPRESSION: 1. Normal CT HEAD with and without contrast. 2. Normal CTA HEAD. 3. Normal CTA  NECK. Electronically Signed   By: Elon Alas M.D.   On: 05/07/2018 00:50   Mr Brain Wo Contrast  Result Date: 05/06/2018 CLINICAL DATA:  48 y/o  F; worsening right-sided headache. EXAM: MRI HEAD WITHOUT CONTRAST TECHNIQUE: Axial DWI, axial T2, axial T2 propeller sequences were acquired. COMPARISON:  05/03/2018 CT head FINDINGS: Brain: No reduced diffusion to suggest acute or early subacute infarction. No abnormal T2 or T2 FLAIR weighted signal of the brain. No mass effect, gross extra-axial collection, hydrocephalus, or herniation is evident. Vascular: Normal T2 flow voids. Skull and upper cervical spine: Normal T2 signal. Sinuses/Orbits: Negative. Other: Partially visualized left larger than right prominent parotid gland ducts. IMPRESSION: 1. Limited sequences. 2. No acute stroke, hemorrhage, or mass effect identified. 3. Partially visualized left larger than right prominent parotid gland ducts, possibly physiologic or related to downstream obstruction. Electronically Signed   By: Kristine Garbe M.D.   On: 05/06/2018 18:27        Scheduled Meds: . dihydroergotamine  0.5 mg Intravenous Daily  . enoxaparin (LOVENOX) injection  60 mg Subcutaneous Daily  . fluticasone  2 spray Each Nare Daily  . insulin aspart  0-5 Units Subcutaneous QHS  . insulin aspart  0-9 Units Subcutaneous TID WC  . insulin glargine  5 Units Subcutaneous Daily   Continuous Infusions: . sodium chloride 100 mL/hr at 05/07/18 0145     LOS: 0 days   Time spent= 40 mins    Anaika Santillano Arsenio Loader, MD [facility]  If 7PM-7AM, please contact night-coverage [email or web address] 05/07/2018, 1:38 PM

## 2018-05-07 NOTE — ED Notes (Signed)
Patient transported to CT 

## 2018-05-07 NOTE — Progress Notes (Signed)
Rx Brief note: Lovenox  Wt=122 kg, BMI = 44, CrCl ~ 58 ml/min  Rx adjusted Lovenox to 60 mg daily (~0.5 mg/kg) in pt with BMI >30  Thanks Dorrene German 05/07/2018 2:49 AM

## 2018-05-07 NOTE — Progress Notes (Signed)
Pt arrived to the floor from the Ed @ 0255. Within 5 min pt. Turned her head to the left and her eyes began to twitch. Then she had movement in her rt leg. This lasted for about 30 sec. Pt continued to have these episodes with increasing frequency and more areas of the body involved, ie, both legs, then head would move left to right. Her upper body began to twitch as well. Dr. Donna Bernard was notified and orders were given for her to receive 1 mg of Ativan and 1500 mg of Keppra IV. The episodes slowed down after Ativan was administered. Pt kept falling asleep, but was arousable and able to answer questions appropriately during this time. VS were stable. Pt's HR was normally in the 80's, but she would become tachy when she had an episode. There have been no more witnessed  episodes since 0350, although her HR has reached over 100 several times.

## 2018-05-07 NOTE — Progress Notes (Signed)
Inpatient Diabetes Program Recommendations  AACE/ADA: New Consensus Statement on Inpatient Glycemic Control (2015)  Target Ranges:  Prepandial:   less than 140 mg/dL      Peak postprandial:   less than 180 mg/dL (1-2 hours)      Critically ill patients:  140 - 180 mg/dL   Lab Results  Component Value Date   GLUCAP 300 (H) 05/07/2018   HGBA1C 14.9 (H) 05/06/2018    Review of Glycemic Control  Diabetes history: DM2 Outpatient Diabetes medications: Amaryl 2 mg QD Current orders for Inpatient glycemic control: Received Novolog 10 units IV  HgbA1C - 14.9%.  Note HgbA1c was 6.2% on 11/06/2017  Inpatient Diabetes Program Recommendations:     Lantus 20 units Q24H Novolog 0-15 units tidwc and hs  Attempted to teach insulin pen administration. Discussed diet, portion control and checking blood sugars several times/day. Pt states she wants to feel better and get her blood sugars under control.   Will continue to follow.   Thank you. Lorenda Peck, RD, LDN, CDE Inpatient Diabetes Coordinator [phone]

## 2018-05-07 NOTE — Care Management Note (Signed)
Case Management Note  Patient Details  Name: Rebecca Johnston MRN: [id number] Date of Birth: 1970/03/16  Subjective/Objective:  Headache. From home w/minor child. Has pcp,pharmacy. DM ed following blood glucose level. If insulin needed @ home recc HHRN-instruction. PT eval await recc. May need HHRN/HHPT-patient will continue to discuss [place] closer to Monday.                  Action/Plan:d/c home w/HHC.   Expected Discharge Date:  (unknown)               Expected Discharge Plan:  [place]  In-House Referral:     Discharge planning Services  CM Consult  Post Acute Care Choice:    Choice offered to:     DME Arranged:    DME Agency:     HH Arranged:    HH Agency:     Status of Service:  In process, will continue to follow  If discussed at Long Length of Stay Meetings, dates discussed:    Additional Comments:  Dessa Phi, RN 05/07/2018, 3:53 PM

## 2018-05-08 ENCOUNTER — Inpatient Hospital Stay (HOSPITAL_COMMUNITY): Payer: Medicaid Other

## 2018-05-08 LAB — CBC
HCT: 38.6 % (ref 36.0–46.0)
HEMOGLOBIN: 11.2 g/dL — AB (ref 12.0–15.0)
MCH: 21.1 pg — ABNORMAL LOW (ref 26.0–34.0)
MCHC: 29 g/dL — AB (ref 30.0–36.0)
MCV: 72.8 fL — ABNORMAL LOW (ref 80.0–100.0)
Platelets: 257 10*3/uL (ref 150–400)
RBC: 5.3 MIL/uL — ABNORMAL HIGH (ref 3.87–5.11)
RDW: 15.2 % (ref 11.5–15.5)
WBC: 8.1 10*3/uL (ref 4.0–10.5)
nRBC: 0 % (ref 0.0–0.2)

## 2018-05-08 LAB — BASIC METABOLIC PANEL
Anion gap: 8 (ref 5–15)
BUN: 12 mg/dL (ref 6–20)
CO2: 19 mmol/L — ABNORMAL LOW (ref 22–32)
Calcium: 8.8 mg/dL — ABNORMAL LOW (ref 8.9–10.3)
Chloride: 108 mmol/L (ref 98–111)
Creatinine, Ser: 1.53 mg/dL — ABNORMAL HIGH (ref 0.44–1.00)
GFR calc Af Amer: 46 mL/min — ABNORMAL LOW (ref >60)
GFR calc non Af Amer: 40 mL/min — ABNORMAL LOW (ref >60)
Glucose, Bld: 186 mg/dL — ABNORMAL HIGH (ref 70–99)
Potassium: 3.6 mmol/L (ref 3.5–5.1)
Sodium: 135 mmol/L (ref 135–145)

## 2018-05-08 LAB — GLUCOSE, CAPILLARY
Glucose-Capillary: 174 mg/dL — ABNORMAL HIGH (ref 70–99)
Glucose-Capillary: 266 mg/dL — ABNORMAL HIGH (ref 70–99)
Glucose-Capillary: 141 mg/dL — ABNORMAL HIGH (ref 70–99)
Glucose-Capillary: 224 mg/dL — ABNORMAL HIGH (ref 70–99)

## 2018-05-08 LAB — MAGNESIUM: Magnesium: 2.1 mg/dL (ref 1.7–2.4)

## 2018-05-08 LAB — TSH: TSH: 1.428 u[IU]/mL (ref 0.350–4.500)

## 2018-05-08 MED ORDER — SODIUM CHLORIDE 0.9 % IV SOLN
1500.0000 mg | Freq: Once | INTRAVENOUS | Status: AC
  Administered 2018-05-08: 1500 mg via INTRAVENOUS
  Filled 2018-05-08: qty 30

## 2018-05-08 MED ORDER — LEVETIRACETAM IN NACL 1500 MG/100ML IV SOLN
1500.0000 mg | Freq: Two times a day (BID) | INTRAVENOUS | Status: DC
  Administered 2018-05-09 – 2018-05-14 (×11): 1500 mg via INTRAVENOUS
  Filled 2018-05-08 (×11): qty 100

## 2018-05-08 MED ORDER — LORAZEPAM 2 MG/ML IJ SOLN
2.0000 mg | Freq: Once | INTRAMUSCULAR | Status: AC
  Administered 2018-05-08: 2 mg via INTRAVENOUS

## 2018-05-08 MED ORDER — SODIUM CHLORIDE 0.9 % IV SOLN
200.0000 mg | INTRAVENOUS | Status: AC
  Administered 2018-05-08: 200 mg via INTRAVENOUS
  Filled 2018-05-08: qty 20

## 2018-05-08 MED ORDER — PHENYTOIN SODIUM 50 MG/ML IJ SOLN
100.0000 mg | Freq: Three times a day (TID) | INTRAMUSCULAR | Status: DC
  Administered 2018-05-09 – 2018-05-12 (×10): 100 mg via INTRAVENOUS
  Filled 2018-05-08 (×11): qty 2

## 2018-05-08 MED ORDER — SODIUM CHLORIDE 0.9 % IV SOLN
2000.0000 mg | Freq: Two times a day (BID) | INTRAVENOUS | Status: DC
  Administered 2018-05-08: 2000 mg via INTRAVENOUS
  Filled 2018-05-08 (×2): qty 20

## 2018-05-08 MED ORDER — POTASSIUM CHLORIDE CRYS ER 20 MEQ PO TBCR
40.0000 meq | EXTENDED_RELEASE_TABLET | Freq: Once | ORAL | Status: AC
  Administered 2018-05-08: 40 meq via ORAL
  Filled 2018-05-08: qty 2

## 2018-05-08 MED ORDER — IOHEXOL 300 MG/ML  SOLN
75.0000 mL | Freq: Once | INTRAMUSCULAR | Status: AC | PRN
  Administered 2018-05-08: 75 mL via INTRAVENOUS

## 2018-05-08 MED ORDER — LEVETIRACETAM IN NACL 1000 MG/100ML IV SOLN
1000.0000 mg | Freq: Two times a day (BID) | INTRAVENOUS | Status: DC
  Administered 2018-05-08: 1000 mg via INTRAVENOUS
  Filled 2018-05-08 (×2): qty 100

## 2018-05-08 MED ORDER — LEVETIRACETAM IN NACL 1000 MG/100ML IV SOLN
1000.0000 mg | Freq: Once | INTRAVENOUS | Status: AC
  Administered 2018-05-09: 1000 mg via INTRAVENOUS
  Filled 2018-05-08: qty 100

## 2018-05-08 NOTE — Progress Notes (Signed)
PROGRESS NOTE    Rebecca Johnston  KDX:[id number] DOB: [birth year] DOA: 05/06/2018 PCP: Ladell Pier, MD   Brief Narrative:  48 year old with history of CKD stage III, obesity, diabetes mellitus type 2, asthma came to the hospital with complains of headache, blurry vision and dizziness.  She also reported of slurred speech and facial droop.  CT of the head, CTA of the head and neck, MRI of the brain-were negative.  UDS, ESR and CRP were negative.  EEG was ordered.  There was concern the patient may have had a seizure-like activity overnight therefore started on Keppra but discontinued the following day.  But later on EEG showed seizure activities therefore started on Keppra and neurology recommended transferring the patient.   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  Neurology input appreciated. - CT of the head, CTA head and neck, MRI brain-negative - UDS, ESR and CRP-negative -EEG- having complex partial seizures -IV Keppra started.  Spoke with neurology who recommended transferring patient to [facility].  We will transfer the patient today. -IV Ativan as needed. -Supportive care - Neurochecks  Asymptomatic V. Tach; intermittent nonsustained -Multiple beats of V. tach overnight.  TSH within normal limits.  Replete electrolytes as necessary. -We will get echocardiogram.  If necessary we will get cardiology consultation.  Headaches concerns for migraines, improved - Continue Tylenol as needed.  IV DHE prescribed by neurology.  Avoid NSAIDs.  Hyponatremia, resolved - Concern for  pseudohyponatremia in the setting of hyperglycemia.  Will repeat again  Uncontrolled diabetes mellitus type 2 - Hemoglobin A1c is now greater than 14.  Diabetic coordinator consulted.  Currently on Lantus 10 units daily -Insulin sliding scale Accu-Chek.  CKD stage III -Creatinine appears to be stable.  Continue to monitor  GERD -Pepcid  DVT prophylaxis: Lovenox Code Status: Full code Family Communication: Mother at bedside Disposition Plan: Transfer patient to [facility] per neurology recommendations. Spoken with Dr Alease Medina who will be available bedside if it becomes necessary once patient arrives there.  Consultants:  Neurology  Procedures:   None  Antimicrobials:   None   Subjective: Patient continues to have multiple what appears to be seizure-like activities periodically but surprisingly she is able to carry on a conversation through these.  Mother is at the bedside as well.She seems to be low upset about her recent ER visits and concerns about diagnosis.  I have explained her that she is allowed to speak with administration if she has any questions or get in touch with the provider.  But at this time all the questions have been answered.  Review of Systems Otherwise negative except as per HPI, including: General = no fevers, chills, dizziness, malaise, fatigue HEENT/EYES = negative for pain, redness, loss of vision, double vision, blurred vision, loss of hearing, sore throat, hoarseness, dysphagia Cardiovascular= negative for chest pain, palpitation, murmurs, lower extremity swelling Respiratory/lungs= negative for shortness of breath, cough, hemoptysis, wheezing, mucus production Gastrointestinal= negative for nausea, vomiting,, abdominal pain, melena, hematemesis Genitourinary= negative for Dysuria, Hematuria, Change in Urinary Frequency MSK = Negative for arthralgia, myalgias, Back Pain, Joint swelling  Neurology= Negative for headache, numbness,  tingling  Psychiatry= Negative for anxiety, depression, suicidal and homocidal ideation Allergy/Immunology= Medication/Food allergy as listed  Skin= Negative for Rash, lesions, ulcers, itching   Objective: Vitals:   05/07/18 0346 05/07/18 1323 05/08/18 0232 05/08/18 0556  BP:  113/72 113/82 126/86  Pulse:  99 79 72  Resp:  '16 16 16  ' Temp: 98.7 F (37.1 C) 99.2 F (37.3 C) 100 F (37.8 C) 98.3 F (36.8 C)  TempSrc: Oral Oral  Oral  SpO2:  99% 94% 98%  Weight:      Height:        Intake/Output Summary (Last 24 hours) at 05/08/2018 1030 Last data filed at 05/08/2018 0818 Gross per 24 hour  Intake 2030 ml  Output 2950 ml  Net -920 ml   Filed Weights   05/07/18 0307  Weight: 118.4 kg    Examination:  Constitutional: NAD, calm, comfortable Eyes: PERRL, lids and conjunctivae normal ENMT: Mucous membranes are moist. Posterior pharynx clear of any exudate or lesions.Normal dentition.  Neck: normal, supple, no masses, no thyromegaly Respiratory: clear to auscultation bilaterally, no wheezing, no crackles. Normal respiratory effort. No accessory muscle use.  Cardiovascular: Regular rate and rhythm, no murmurs / rubs / gallops. No extremity edema. 2+ pedal pulses. No carotid bruits.  Abdomen: no tenderness, no masses palpated. No hepatosplenomegaly. Bowel sounds positive.  Musculoskeletal: no clubbing / cyanosis. No joint deformity upper and lower extremities. Good ROM, no contractures. Normal muscle tone.  Skin: no rashes, lesions, ulcers. No induration Neurologic: CN 2-12 grossly intact.  Intermittent shaking of lower and upper extremities and turning of her head but able to carry on conversations. Psychiatric: Normal judgment and insight. Alert and oriented x 3. Normal mood.   Data Reviewed:   CBC: Recent Labs  Lab 05/03/18 1303 05/06/18 1300 05/08/18 0514  WBC 9.1 11.9* 8.1  NEUTROABS 6.3 9.4*  --   HGB 14.0 12.8 11.2*  HCT 46.1* 42.6 38.6  MCV 70.1* 70.6* 72.8*  PLT  341 324 250   Basic Metabolic Panel: Recent Labs  Lab 05/03/18 1303 05/06/18 1300 05/07/18 1350 05/08/18 0514  NA 130* 127* 131* 135  K 4.3 4.4 3.7 3.6  CL 101 97* 103 108  CO2 16* 18* 20* 19*  GLUCOSE 355* 355* 274* 186*  BUN '18 15 14 12  ' CREATININE 1.71* 1.54* 1.53* 1.53*  CALCIUM 9.8 9.7 9.1 8.8*  MG  --   --   --  2.1   GFR: Estimated Creatinine Clearance: 57.9 mL/min (A) (by C-G formula based on SCr of 1.53 mg/dL (H)). Liver Function Tests: No results for input(s): AST, ALT, ALKPHOS, BILITOT, PROT, ALBUMIN in the last 168 hours. No results for input(s): LIPASE, AMYLASE in the last 168 hours. No results for input(s): AMMONIA in the last 168 hours. Coagulation Profile: No results for input(s): INR, PROTIME in the last 168 hours. Cardiac Enzymes: No results for input(s): CKTOTAL, CKMB, CKMBINDEX, TROPONINI in the last 168 hours. BNP (last 3 results) No results for input(s): PROBNP in the last 8760 hours. HbA1C: Recent Labs    05/06/18 1300  HGBA1C 14.9*   CBG: Recent Labs  Lab 05/07/18 0721 05/07/18 1134 05/07/18 1652 05/07/18 2233 05/08/18 0755  GLUCAP 206* 300* 201* 189* 174*   Lipid Profile: No results for input(s): CHOL, HDL, LDLCALC, TRIG, CHOLHDL, LDLDIRECT in the last 72 hours. Thyroid Function Tests: Recent Labs    05/08/18 0808  TSH 1.428   Anemia Panel: No results for input(s): VITAMINB12, FOLATE, FERRITIN, TIBC, IRON,  RETICCTPCT in the last 72 hours. Sepsis Labs: No results for input(s): PROCALCITON, LATICACIDVEN in the last 168 hours.  Recent Results (from the past 240 hour(s))  Culture, blood (Routine X 2) w Reflex to ID Panel     Status: None (Preliminary result)   Collection Time: 05/07/18  1:40 AM  Result Value Ref Range Status   Specimen Description   Final    BLOOD RIGHT ANTECUBITAL Performed at [facility] [address] [place], [place] [address]    Special Requests   Final    BOTTLES DRAWN AEROBIC AND  ANAEROBIC Blood Culture adequate volume Performed at [facility] [address] [place], [place] [address]    Culture   Final    NO GROWTH 1 DAY Performed at [facility], [place] [address], [place], [place] [address]    Report Status PENDING  Incomplete  Culture, blood (Routine X 2) w Reflex to ID Panel     Status: None (Preliminary result)   Collection Time: 05/07/18  3:31 AM  Result Value Ref Range Status   Specimen Description   Final    BLOOD LEFT HAND Performed at [facility], [place] [address] [place], [place] [address]    Special Requests   Final    BOTTLES DRAWN AEROBIC AND ANAEROBIC Blood Culture adequate volume Performed at [facility] [address] [place], [place] [address]    Culture   Final    NO GROWTH 1 DAY Performed at [facility], [place] [address], [place], [place] [address]    Report Status PENDING  Incomplete         Radiology Studies: Ct Angio Head W Or Wo Contrast  Result Date: 05/07/2018 CLINICAL DATA:  RIGHT-sided headache. Symptoms worse after starting antibiotics for pneumonia. Dizziness. History of diabetes. EXAM: CT ANGIOGRAPHY HEAD AND NECK TECHNIQUE: Multidetector CT imaging of the head and neck was performed using the standard protocol during bolus administration of intravenous contrast. Multiplanar CT image reconstructions and MIPs were obtained to evaluate the vascular anatomy. Carotid stenosis measurements (when applicable) are obtained utilizing NASCET criteria, using the distal internal carotid diameter as the denominator. CONTRAST:  44m OMNIPAQUE IOHEXOL 350 MG/ML SOLN COMPARISON:  MRI head May 06, 2018 FINDINGS: CT HEAD FINDINGS BRAIN: No intraparenchymal hemorrhage, mass effect nor midline shift. The ventricles and sulci are normal. No acute large vascular territory infarcts. No abnormal extra-axial fluid collections. Basal cisterns are patent. VASCULAR: Unremarkable. SKULL/SOFT  TISSUES: No skull fracture. No significant soft tissue swelling. ORBITS/SINUSES: The included ocular globes and orbital contents are normal.Trace paranasal sinus mucosal thickening. Mastoid air cells are well aerated. OTHER: None. CTA NECK FINDINGS: AORTIC ARCH: Normal appearance of the thoracic arch, normal branch pattern. The origins of the innominate, left Common carotid artery and subclavian artery are patent. RIGHT CAROTID SYSTEM: Common carotid artery is patent, medial course. Normal appearance of the carotid bifurcation without hemodynamically significant stenosis by NASCET criteria. Normal appearance of the internal carotid artery. LEFT CAROTID SYSTEM: Common carotid artery is patent, medial course. Normal appearance of the carotid bifurcation without hemodynamically significant stenosis by NASCET criteria. Normal appearance of the internal carotid artery. VERTEBRAL ARTERIES:Codominant patent vertebral arteries. SKELETON: No acute osseous process though bone windows have not been submitted. Mild cervical spondylosis. OTHER NECK: Soft tissues of the neck are nonacute though, not tailored for evaluation. UPPER CHEST: Included lung apices are clear. No superior mediastinal lymphadenopathy. CTA HEAD FINDINGS: ANTERIOR CIRCULATION:  Patent cervical internal carotid arteries, petrous, cavernous and supra clinoid internal carotid arteries. Patent anterior communicating artery. Patent anterior and middle cerebral arteries. No large vessel occlusion, flow-limiting stenosis, contrast extravasation or aneurysm. POSTERIOR CIRCULATION: Patent vertebral arteries, vertebrobasilar junction and basilar artery, as well as main branch vessels. Patent posterior cerebral arteries. Small bilateral posterior communicating arteries present. Chest No large vessel occlusion, flow-limiting stenosis, contrast extravasation or aneurysm. VENOUS SINUSES: Major dural venous sinuses are patent though not tailored for evaluation on this  angiographic examination. ANATOMIC VARIANTS: None. DELAYED PHASE: Not performed. MIP images reviewed. IMPRESSION: 1. Normal CT HEAD with and without contrast. 2. Normal CTA HEAD. 3. Normal CTA NECK. Electronically Signed   By: Elon Alas M.D.   On: 05/07/2018 00:50   Dg Chest 2 View  Result Date: 05/06/2018 CLINICAL DATA:  Headache, cough, recent history of pneumonia. EXAM: CHEST - 2 VIEW COMPARISON:  Chest x-rays dated 04/29/2018 and 04/27/2013. FINDINGS: Study is hypoinspiratory with crowding of the perihilar and bibasilar bronchovascular markings. Given the low lung volumes, lungs appear clear. No evidence of consolidating pneumonia. No pleural effusion or pneumothorax seen. Heart size and mediastinal contours are within normal limits. No acute or suspicious osseous finding. IMPRESSION: Low lung volumes. No active cardiopulmonary disease. No evidence of pneumonia. Electronically Signed   By: Franki Cabot M.D.   On: 05/06/2018 13:58   Ct Angio Neck W And/or Wo Contrast  Result Date: 05/07/2018 CLINICAL DATA:  RIGHT-sided headache. Symptoms worse after starting antibiotics for pneumonia. Dizziness. History of diabetes. EXAM: CT ANGIOGRAPHY HEAD AND NECK TECHNIQUE: Multidetector CT imaging of the head and neck was performed using the standard protocol during bolus administration of intravenous contrast. Multiplanar CT image reconstructions and MIPs were obtained to evaluate the vascular anatomy. Carotid stenosis measurements (when applicable) are obtained utilizing NASCET criteria, using the distal internal carotid diameter as the denominator. CONTRAST:  57m OMNIPAQUE IOHEXOL 350 MG/ML SOLN COMPARISON:  MRI head May 06, 2018 FINDINGS: CT HEAD FINDINGS BRAIN: No intraparenchymal hemorrhage, mass effect nor midline shift. The ventricles and sulci are normal. No acute large vascular territory infarcts. No abnormal extra-axial fluid collections. Basal cisterns are patent. VASCULAR: Unremarkable.  SKULL/SOFT TISSUES: No skull fracture. No significant soft tissue swelling. ORBITS/SINUSES: The included ocular globes and orbital contents are normal.Trace paranasal sinus mucosal thickening. Mastoid air cells are well aerated. OTHER: None. CTA NECK FINDINGS: AORTIC ARCH: Normal appearance of the thoracic arch, normal branch pattern. The origins of the innominate, left Common carotid artery and subclavian artery are patent. RIGHT CAROTID SYSTEM: Common carotid artery is patent, medial course. Normal appearance of the carotid bifurcation without hemodynamically significant stenosis by NASCET criteria. Normal appearance of the internal carotid artery. LEFT CAROTID SYSTEM: Common carotid artery is patent, medial course. Normal appearance of the carotid bifurcation without hemodynamically significant stenosis by NASCET criteria. Normal appearance of the internal carotid artery. VERTEBRAL ARTERIES:Codominant patent vertebral arteries. SKELETON: No acute osseous process though bone windows have not been submitted. Mild cervical spondylosis. OTHER NECK: Soft tissues of the neck are nonacute though, not tailored for evaluation. UPPER CHEST: Included lung apices are clear. No superior mediastinal lymphadenopathy. CTA HEAD FINDINGS: ANTERIOR CIRCULATION: Patent cervical internal carotid arteries, petrous, cavernous and supra clinoid internal carotid arteries. Patent anterior communicating artery. Patent anterior and middle cerebral arteries. No large vessel occlusion, flow-limiting stenosis, contrast extravasation or aneurysm. POSTERIOR CIRCULATION: Patent vertebral arteries, vertebrobasilar junction and basilar artery, as well as main branch vessels. Patent posterior cerebral arteries. Small bilateral posterior  communicating arteries present. Chest No large vessel occlusion, flow-limiting stenosis, contrast extravasation or aneurysm. VENOUS SINUSES: Major dural venous sinuses are patent though not tailored for evaluation on  this angiographic examination. ANATOMIC VARIANTS: None. DELAYED PHASE: Not performed. MIP images reviewed. IMPRESSION: 1. Normal CT HEAD with and without contrast. 2. Normal CTA HEAD. 3. Normal CTA NECK. Electronically Signed   By: Elon Alas M.D.   On: 05/07/2018 00:50   Mr Brain Wo Contrast  Result Date: 05/06/2018 CLINICAL DATA:  48 y/o  F; worsening right-sided headache. EXAM: MRI HEAD WITHOUT CONTRAST TECHNIQUE: Axial DWI, axial T2, axial T2 propeller sequences were acquired. COMPARISON:  05/03/2018 CT head FINDINGS: Brain: No reduced diffusion to suggest acute or early subacute infarction. No abnormal T2 or T2 FLAIR weighted signal of the brain. No mass effect, gross extra-axial collection, hydrocephalus, or herniation is evident. Vascular: Normal T2 flow voids. Skull and upper cervical spine: Normal T2 signal. Sinuses/Orbits: Negative. Other: Partially visualized left larger than right prominent parotid gland ducts. IMPRESSION: 1. Limited sequences. 2. No acute stroke, hemorrhage, or mass effect identified. 3. Partially visualized left larger than right prominent parotid gland ducts, possibly physiologic or related to downstream obstruction. Electronically Signed   By: Kristine Garbe M.D.   On: 05/06/2018 18:27        Scheduled Meds: . dihydroergotamine  0.5 mg Intravenous Daily  . enoxaparin (LOVENOX) injection  60 mg Subcutaneous Daily  . fluticasone  2 spray Each Nare Daily  . insulin aspart  0-5 Units Subcutaneous QHS  . insulin aspart  0-9 Units Subcutaneous TID WC  . insulin glargine  10 Units Subcutaneous Daily   Continuous Infusions: . sodium chloride 100 mL/hr at 05/08/18 0600  . levETIRAcetam 2,000 mg (05/08/18 0908)     LOS: 1 day   Time spent= 45 mins    Ankit Arsenio Loader, MD [facility]  If 7PM-7AM, please contact night-coverage [email or web address] 05/08/2018, 10:30 AM

## 2018-05-08 NOTE — Progress Notes (Signed)
Reason for consult: Status Epilepticus   Subjective:  Patient recently transferred from [facility] for LTM monitoring. She was loaded with Keppra 2g earlier in the day. Patient awake and talking as I walked in the room she was having a spell. She has head version to the left side with forced gaze deviation to the left. Occasional jerking of left leg. Awake and alert, answering questions while this was happening. States she has been having these head turning episodes for over a week. Complaining of headache.   ROS: negative except above   Examination  Vital signs in last 24 hours: Temp:  [97.7 F (36.5 C)-100 F (37.8 C)] 97.8 F (36.6 C) (03/07 1557) Pulse Rate:  [72-79] 75 (03/07 1557) Resp:  [16-23] 18 (03/07 1557) BP: (113-126)/(82-89) 124/86 (03/07 1557) SpO2:  [94 %-99 %] 99 % (03/07 1557)  General: lying in bed CVS: pulse-normal rate and rhythm RS: breathing comfortably Extremities: normal   Neuro: MS: Alert, oriented, follows commands CN: pupils equal and reactive, forced gaze towards left side intermittently, face symmetric, tongue midline Motor: 5/5 strength in RUE and RLE,  LUE 4/5 with intermittent jerking of left leg with 2/5 strength in left leg. Reflexes: 2+ bilaterally over patella, biceps, plantars: flexor Coordination: normal Gait: not tested   Basic Metabolic Panel: Recent Labs  Lab 05/03/18 1303 05/06/18 1300 05/07/18 1350 05/08/18 0514  NA 130* 127* 131* 135  K 4.3 4.4 3.7 3.6  CL 101 97* 103 108  CO2 16* 18* 20* 19*  GLUCOSE 355* 355* 274* 186*  BUN 18 15 14 12   CREATININE 1.71* 1.54* 1.53* 1.53*  CALCIUM 9.8 9.7 9.1 8.8*  MG  --   --   --  2.1    CBC: Recent Labs  Lab 05/03/18 1303 05/06/18 1300 05/08/18 0514  WBC 9.1 11.9* 8.1  NEUTROABS 6.3 9.4*  --   HGB 14.0 12.8 11.2*  HCT 46.1* 42.6 38.6  MCV 70.1* 70.6* 72.8*  PLT 341 324 257     Coagulation Studies: No results for input(s): LABPROT, INR in the last 72  hours.  Imaging Reviewed:     ASSESSMENT AND PLAN  48 year old with history of CKD stage III, obesity, diabetes mellitus type 2, asthma came to the hospital with complains of headache, blurry vision and dizziness. Had multiple episodes of head turning initially thought to be seizures. Evaluated by neurology and felt more likely this are dystonic reactions due to compazine/meclizine given for headache. She was started on DHE. EEG was obtained and was concerning for partial seizures, however the neurologist who assessed patient felt they were likely artifact. MRI Brain performed shows no acute findings.   I received a call today by hospitalist stating patietn continues to have multiple episodes, is slightly confused but awake. Recommended transfer to [facility] for continous EEG and recommended loading with Keppra 2g.  On arrival to [facility], she continues to have these spells of head turning/gaze deviation an  Focal Status epilepticus Todd's paresis  ? Heart block on telemetry - EKG sinus rhythm   Plan LTM  EEG shows multiple right temporal seizures,  Load with fosphenytoin 1.5 g and start dilantin 100mg  TID, will check levels tomorrow  Continue Keppra 1g BID, received 2g loading dose today- can go up if needed Would avoid Vimpat due ?heart block  MRI brain negative, however would repeat MRI brain w contrast with venogram once we are able to remove EEG leads. Will repeat CT head in the meantime.  Seizure  precautions Ativan PRN for seizures Will discontinue DHE as her headache is likely for post ictal      This patient is neurologically critically ill due to focal status epilepticus. She is at risk for significant risk of neurological worsening from progression of seizures/status, heart failure, infection, respiratory failure and sudden death in epilepsy.  This patient's care requires constant monitoring of vital signs, reviewing EEG,  hemodynamics, respiratory and cardiac monitoring, review  of multiple databases, neurological assessment  and medical decision making of high complexity.  I spent 40  minutes of neurocritical time in the care of this patient.     Karena Addison Gen Clagg [facility] Pager Number [phone] For questions after 7pm please refer to AMION to reach the Neurologist on call

## 2018-05-08 NOTE — Progress Notes (Signed)
Pt returned from the CT. Family at the bedside, updated.  Bed in low position, alarms are on, call bell in reach,seizure pads are on. Report given to upcoming nurse

## 2018-05-08 NOTE — Progress Notes (Signed)
Pt arrived on the unit. A/o, but delayed response, at times eyes are moving fast. Per EMS it is not long, 10 sec, and MD aware. Seizure pads are on. EEG are on. Pt heart rhythm changed, EKG was done, MD at the bedside. Pt started having seizure, involving full body , under one minute. Ativan was given per MD 2mg .  Bed in low position, alarms are on, call in reach.

## 2018-05-08 NOTE — Progress Notes (Signed)
LTM EEG applied and running - pt had multiple events during hookup. [facility] notified. No skin breakdown at hookup.

## 2018-05-08 NOTE — Progress Notes (Signed)
PT Cancellation Note  Patient Details Name: Rebecca Johnston MRN: [id number] DOB: 06-18-1970   Cancelled Treatment:     Chart reviewed and spoke to the pt's nurse. Noted pt having multiple partial seizures ( and did during the night) , started on Keepra, will hold today.   W/e pager [phone]  Clide Dales 05/08/2018, 12:47 PM  Clide Dales, PT Acute Rehabilitation Services Pager: [phone] Office: [phone] 05/08/2018

## 2018-05-08 NOTE — Plan of Care (Signed)
  Problem: Pain Managment: Goal: General experience of comfort will improve Outcome: Progressing   Problem: Safety: Goal: Ability to remain free from injury will improve Outcome: Progressing   Problem: Skin Integrity: Goal: Risk for impaired skin integrity will decrease Outcome: Progressing   

## 2018-05-09 ENCOUNTER — Other Ambulatory Visit (HOSPITAL_COMMUNITY): Payer: Medicaid Other

## 2018-05-09 ENCOUNTER — Inpatient Hospital Stay (HOSPITAL_COMMUNITY): Payer: Medicaid Other

## 2018-05-09 ENCOUNTER — Inpatient Hospital Stay: Payer: Self-pay

## 2018-05-09 DIAGNOSIS — E1165 Type 2 diabetes mellitus with hyperglycemia: Secondary | ICD-10-CM

## 2018-05-09 DIAGNOSIS — R0689 Other abnormalities of breathing: Secondary | ICD-10-CM

## 2018-05-09 DIAGNOSIS — I340 Nonrheumatic mitral (valve) insufficiency: Secondary | ICD-10-CM

## 2018-05-09 DIAGNOSIS — Z452 Encounter for adjustment and management of vascular access device: Secondary | ICD-10-CM

## 2018-05-09 DIAGNOSIS — G40901 Epilepsy, unspecified, not intractable, with status epilepticus: Secondary | ICD-10-CM

## 2018-05-09 DIAGNOSIS — G40201 Localization-related (focal) (partial) symptomatic epilepsy and epileptic syndromes with complex partial seizures, not intractable, with status epilepticus: Principal | ICD-10-CM

## 2018-05-09 LAB — CBC
HEMATOCRIT: 37.8 % (ref 36.0–46.0)
Hemoglobin: 11.2 g/dL — ABNORMAL LOW (ref 12.0–15.0)
MCH: 21.1 pg — ABNORMAL LOW (ref 26.0–34.0)
MCHC: 29.6 g/dL — ABNORMAL LOW (ref 30.0–36.0)
MCV: 71.1 fL — ABNORMAL LOW (ref 80.0–100.0)
Platelets: 371 10*3/uL (ref 150–400)
RBC: 5.32 MIL/uL — ABNORMAL HIGH (ref 3.87–5.11)
RDW: 15.9 % — ABNORMAL HIGH (ref 11.5–15.5)
WBC: 11.6 10*3/uL — ABNORMAL HIGH (ref 4.0–10.5)
nRBC: 0 % (ref 0.0–0.2)

## 2018-05-09 LAB — GLUCOSE, CAPILLARY
GLUCOSE-CAPILLARY: 174 mg/dL — AB (ref 70–99)
GLUCOSE-CAPILLARY: 146 mg/dL — AB (ref 70–99)
Glucose-Capillary: 109 mg/dL — ABNORMAL HIGH (ref 70–99)
Glucose-Capillary: 144 mg/dL — ABNORMAL HIGH (ref 70–99)
Glucose-Capillary: 159 mg/dL — ABNORMAL HIGH (ref 70–99)

## 2018-05-09 LAB — BASIC METABOLIC PANEL
Anion gap: 8 (ref 5–15)
BUN: 9 mg/dL (ref 6–20)
CO2: 19 mmol/L — ABNORMAL LOW (ref 22–32)
Calcium: 8.9 mg/dL (ref 8.9–10.3)
Chloride: 109 mmol/L (ref 98–111)
Creatinine, Ser: 1.49 mg/dL — ABNORMAL HIGH (ref 0.44–1.00)
GFR calc Af Amer: 48 mL/min — ABNORMAL LOW (ref >60)
GFR calc non Af Amer: 41 mL/min — ABNORMAL LOW (ref >60)
Glucose, Bld: 171 mg/dL — ABNORMAL HIGH (ref 70–99)
Potassium: 3.8 mmol/L (ref 3.5–5.1)
Sodium: 136 mmol/L (ref 135–145)

## 2018-05-09 LAB — ECHOCARDIOGRAM COMPLETE
Height: 65 in
WEIGHTICAEL: 4176.39 [oz_av]

## 2018-05-09 LAB — POCT I-STAT 7, (LYTES, BLD GAS, ICA,H+H)
Acid-base deficit: 3 mmol/L — ABNORMAL HIGH (ref 0.0–2.0)
Bicarbonate: 22.4 mmol/L (ref 20.0–28.0)
Calcium, Ion: 1.22 mmol/L (ref 1.15–1.40)
HEMATOCRIT: 34 % — AB (ref 36.0–46.0)
Hemoglobin: 11.6 g/dL — ABNORMAL LOW (ref 12.0–15.0)
O2 Saturation: 100 %
Potassium: 3.6 mmol/L (ref 3.5–5.1)
Sodium: 139 mmol/L (ref 135–145)
TCO2: 24 mmol/L (ref 22–32)
pCO2 arterial: 40.4 mmHg (ref 32.0–48.0)
pH, Arterial: 7.351 (ref 7.350–7.450)
pO2, Arterial: 476 mmHg — ABNORMAL HIGH (ref 83.0–108.0)

## 2018-05-09 LAB — MRSA PCR SCREENING: MRSA by PCR: NEGATIVE

## 2018-05-09 LAB — PHENYTOIN LEVEL, TOTAL: Phenytoin Lvl: 11.7 ug/mL (ref 10.0–20.0)

## 2018-05-09 LAB — MAGNESIUM: Magnesium: 2 mg/dL (ref 1.7–2.4)

## 2018-05-09 LAB — TRIGLYCERIDES: Triglycerides: 320 mg/dL — ABNORMAL HIGH (ref <150)

## 2018-05-09 MED ORDER — CHLORHEXIDINE GLUCONATE 0.12% ORAL RINSE (MEDLINE KIT)
15.0000 mL | Freq: Two times a day (BID) | OROMUCOSAL | Status: DC
  Administered 2018-05-09 – 2018-05-12 (×6): 15 mL via OROMUCOSAL

## 2018-05-09 MED ORDER — FENTANYL 2500MCG IN NS 250ML (10MCG/ML) PREMIX INFUSION
0.0000 ug/h | INTRAVENOUS | Status: DC
  Administered 2018-05-09: 50 ug/h via INTRAVENOUS
  Filled 2018-05-09: qty 250

## 2018-05-09 MED ORDER — SODIUM CHLORIDE 0.9 % IV SOLN
750.0000 mg | Freq: Once | INTRAVENOUS | Status: AC
  Administered 2018-05-09: 750 mg via INTRAVENOUS
  Filled 2018-05-09: qty 5.77

## 2018-05-09 MED ORDER — ORAL CARE MOUTH RINSE
15.0000 mL | OROMUCOSAL | Status: DC
  Administered 2018-05-09 – 2018-05-13 (×31): 15 mL via OROMUCOSAL

## 2018-05-09 MED ORDER — PREGABALIN 75 MG PO CAPS
200.0000 mg | ORAL_CAPSULE | Freq: Once | ORAL | Status: AC
  Administered 2018-05-09: 200 mg via ORAL
  Filled 2018-05-09: qty 2

## 2018-05-09 MED ORDER — SODIUM CHLORIDE 0.9 % IV SOLN
INTRAVENOUS | Status: DC | PRN
  Administered 2018-05-12: 250 mL via INTRAVENOUS

## 2018-05-09 MED ORDER — PREGABALIN 100 MG PO CAPS
200.0000 mg | ORAL_CAPSULE | Freq: Three times a day (TID) | ORAL | Status: DC
  Filled 2018-05-09: qty 2

## 2018-05-09 MED ORDER — FENTANYL CITRATE (PF) 100 MCG/2ML IJ SOLN
INTRAMUSCULAR | Status: AC
  Filled 2018-05-09: qty 2

## 2018-05-09 MED ORDER — LORAZEPAM 2 MG/ML IJ SOLN: 2.0000 mg | Freq: Once | INTRAMUSCULAR | Status: AC

## 2018-05-09 MED ORDER — INSULIN ASPART 100 UNIT/ML ~~LOC~~ SOLN
0.0000 [IU] | SUBCUTANEOUS | Status: DC
  Administered 2018-05-09 (×2): 1 [IU] via SUBCUTANEOUS
  Administered 2018-05-09 – 2018-05-10 (×3): 2 [IU] via SUBCUTANEOUS
  Administered 2018-05-10: 1 [IU] via SUBCUTANEOUS
  Administered 2018-05-10: 2 [IU] via SUBCUTANEOUS
  Administered 2018-05-11 (×3): 1 [IU] via SUBCUTANEOUS
  Administered 2018-05-11: 2 [IU] via SUBCUTANEOUS
  Administered 2018-05-13: 3 [IU] via SUBCUTANEOUS
  Administered 2018-05-13 – 2018-05-14 (×4): 2 [IU] via SUBCUTANEOUS
  Administered 2018-05-14 (×2): 1 [IU] via SUBCUTANEOUS

## 2018-05-09 MED ORDER — FENTANYL CITRATE (PF) 100 MCG/2ML IJ SOLN
50.0000 ug | Freq: Once | INTRAMUSCULAR | Status: AC
  Administered 2018-05-09: 50 ug via INTRAVENOUS

## 2018-05-09 MED ORDER — MIDAZOLAM HCL 2 MG/2ML IJ SOLN
INTRAMUSCULAR | Status: AC
  Filled 2018-05-09: qty 2

## 2018-05-09 MED ORDER — PROPOFOL 1000 MG/100ML IV EMUL
60.0000 ug/kg/min | INTRAVENOUS | Status: DC
  Administered 2018-05-09 (×2): 40 ug/kg/min via INTRAVENOUS
  Administered 2018-05-10: 60 ug/kg/min via INTRAVENOUS
  Administered 2018-05-10: 50 ug/kg/min via INTRAVENOUS
  Administered 2018-05-10 (×2): 60 ug/kg/min via INTRAVENOUS
  Filled 2018-05-09 (×7): qty 100

## 2018-05-09 MED ORDER — SODIUM CHLORIDE 0.9 % IV SOLN
INTRAVENOUS | Status: AC
  Administered 2018-05-09 (×2): via INTRAVENOUS

## 2018-05-09 MED ORDER — NOREPINEPHRINE 4 MG/250ML-% IV SOLN
0.0000 ug/min | INTRAVENOUS | Status: DC
  Administered 2018-05-09: 2 ug/min via INTRAVENOUS
  Administered 2018-05-10: 7 ug/min via INTRAVENOUS
  Filled 2018-05-09 (×2): qty 250

## 2018-05-09 MED ORDER — LEVALBUTEROL HCL 1.25 MG/0.5ML IN NEBU
0.6300 mg | INHALATION_SOLUTION | Freq: Four times a day (QID) | RESPIRATORY_TRACT | Status: DC | PRN
  Filled 2018-05-09: qty 0.5

## 2018-05-09 MED ORDER — SODIUM CHLORIDE 0.9 % IV SOLN
500.0000 mg | Freq: Once | INTRAVENOUS | Status: AC
  Administered 2018-05-09: 500 mg via INTRAVENOUS
  Filled 2018-05-09: qty 3.85

## 2018-05-09 MED ORDER — ETOMIDATE 2 MG/ML IV SOLN
20.0000 mg | Freq: Once | INTRAVENOUS | Status: AC
  Administered 2018-05-09: 20 mg via INTRAVENOUS

## 2018-05-09 MED ORDER — PREGABALIN 75 MG PO CAPS
200.0000 mg | ORAL_CAPSULE | Freq: Three times a day (TID) | ORAL | Status: DC
  Administered 2018-05-09 – 2018-05-13 (×12): 200 mg
  Filled 2018-05-09 (×11): qty 2

## 2018-05-09 NOTE — Progress Notes (Signed)
Called report to Amy on 4 N

## 2018-05-09 NOTE — Progress Notes (Signed)
PROGRESS NOTE   Rebecca Johnston  IRS:[id number]    DOB: 09/07/1970    DOA: 05/06/2018  PCP: Ladell Pier, MD   I have briefly reviewed patients previous medical records in [facility].  Brief Narrative:  48 year old female with PMH of DM 2, asthma, GERD, morbid obesity, anemia, fibroids, CKD 3, chronic headaches, initially presented to [facility] due to headache, blurred vision and dizziness and several falls, recently treated for community-acquired pneumonia on 2/27 with doxycycline which she reportedly stopped taking day prior to admission due to hallucinations, dizziness and bad dreams.  CT brain, CTA brain/neck and MRI brain were normal.  UDS, ESR and CRP normal.  Blood glucose on admission 355 and sodium 127.  She received Reglan and Compazine for nausea and then noted to have head and neck deviation with possible eye deviation several times.  She was loaded and continued on Keppra.  Neurology consulted and initially suspected dystonic reaction to Reglan and or Compazine and/or meclizine, felt seizure was less likely, Keppra was thereby discontinued, suspected status migrainous and started on IV DHE.  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  Neurology was consulted again, loaded with Keppra and transferred to [facility] for LTM monitoring.  On 3/8, despite multiple AEDs (IV Keppra, IV Dilantin and oral Lyrica) she continued to have frequent seizures.  Neuro Hospitalist recommended transferring to ICU under CCM care (in case her mental status worsens and requires intubation for airway protection) for initiation of IV phenobarbital.  TRH signed off 3/8.   Assessment & Plan:   Principal Problem:   Headache Active Problems:   Type II diabetes mellitus with renal manifestations (HCC)   CKD (chronic kidney disease), stage III (HCC)   Lobar  pneumonia (HCC)   GERD (gastroesophageal reflux disease)   Hyponatremia   HA (headache)   1. 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  CT brain (3/2), CTA brain/neck (3/6), MRI brain (3/5) and repeat CT brain( 3/7) were normal. TSH 1.428.  CRP 1, ESR 21, HIV screen nonreactive, UDS negative, UA not suggestive of UTI, A1c 14.9.  Blood cultures x2: Negative to date.  Glycemic control has improved since admission.  Despite multiple AEDs (IV Keppra, IV Dilantin after fosphenytoin load, oral Lyrica, a dose of IV Vimpat), continued to have seizures clinically and on LTM.  Discussed with Dr. Lorraine Lax, Neuro Hospitalist and patient transferred to ICU under CCM care in case patient becomes obtunded, unable to protect airway and needs intubation.  Starting on IV phenobarbital in addition to Keppra, Dilantin and Lyrica.  Continue PRN IV Ativan.  Vimpat being avoided due to questionable heart block on telemetry (which appears to be transient second-degree Mobitz type I AV block/Wenckebach's on 3/7).  Continue long-term EEG.  DM control is better but not optimal yet.  As per neurology, this could be due to severe hyperglycemia,?  T2 hypodensity noted in the right occipital lobe.  Neurology also plans to repeat MRI brain with contrast when patient stable. 2. Headache: Patient has history of mild intermittent chronic headaches.  Has not seen a neurologist in the past.  Neurology initially suspected status migrainous and started her on DHE.  Subsequently diagnosed with seizures and headache felt to be postictal.  DHE discontinued.  Headaches seem to have resolved today.  Monitor. 3. Type II DM with hyperglycemia in obese:  A1c: 14.9 suggesting very poor outpatient control.  Currently on Lantus 10 units, SSI changed to every 4 hours. NPO due to sedation from multiple AEDs and postictal.  Hyperglycemia better compared to admission but not optimal.   CBGs ranging between 141-266 over the last 24 hours.  Adjust insulins as needed.  Continue gentle IV saline hydration while n.p.o.  Patient will eventually need to discharge on insulins. 4. Hyponatremia: Possibly a combination of dehydration and pseudohyponatremia from DM.  Resolved. 5. Stage III CKD: Baseline creatinine may be in the 1.5-1.8 range.  Creatinine has been stable in the 1.5 range for the last 3 days.  Monitor closely. 6. Microcytic anemia: Mild.  Follow CBCs. 7. GERD: Consider PPI. 8. NSVT: Reportedly noted at [facility].  Magnesium 2.1.  Potassium 3.6.  Continue monitoring on telemetry.  Follow 2D echo results (has been ordered). 9. Heart block: Reviewed telemetry and noted second-degree Mobitz type I/Wenckebach's transiently on 3/7.  Avoid AV nodal blocking agents.  Continue to monitor on telemetry.  If has recurrence or worsening, consider cardiology consultation 10. Morbid obesity/Body mass index is 43.44 kg/m.    DVT prophylaxis: Lovenox Code Status: Full Family Communication: Discussed in detail with patient's mother and aunt at bedside, updated care and answered questions including plan to transfer to ICU. Disposition: Initially admitted to [facility], transferred to [facility] 3/7, transferred to neuro ICU on 3/8.   Consultants:  Neurology PCCM  Procedures:  LTM  Antimicrobials:  None   Subjective: Patient interviewed and examined this morning along with mother, aunt and patient's RN in room.  Patient drowsy but easily arousable.  Seems oriented.  Ongoing frequent episodes of left-sided head turn with left gaze deviation and involuntary left lower extremity movements.  No headache reported.  ROS: As above, otherwise unable.  Objective:  Vitals:   05/09/18 0000 05/09/18 0107 05/09/18 0436 05/09/18 0900  BP: 126/86 114/73 119/88 119/67  Pulse: 95 76 85 82  Resp: '20 20 20   ' Temp: 98.4 F (36.9 C) 98.3 F (36.8 C) (!) 97.5 F (36.4 C) 98.2 F  (36.8 C)  TempSrc: Oral Oral Oral Oral  SpO2: 98% 100% 99% 100%  Weight:      Height:        Examination:  General exam: Pleasant young female, moderately built and morbidly obese lying comfortably propped up in bed without distress. Respiratory system: Clear to auscultation. Respiratory effort normal. Cardiovascular system: S1 & S2 heard, RRR. No JVD, murmurs, rubs, gallops or clicks. No pedal edema.  Telemetry personally reviewed: Sinus rhythm.  Suspected transient Mobitz type I second-degree AV block on 3/7 Gastrointestinal system: Abdomen is nondistended, soft and nontender. No organomegaly or masses felt. Normal bowel sounds heard. Central nervous system: Mental status as noted above. No focal neurological deficits.  Noted a brief episode of leftward head deviation, left gaze deviation and involuntary left lower extremity rhythmic movements. Extremities: Right limbs normal power.  Left sided grade 2 x 5 power. Skin: No rashes, lesions or ulcers Psychiatry: Judgement and insight appear impaired. Mood & affect unable to assess at this time.     Data Reviewed: I have personally reviewed following labs and imaging studies  CBC: Recent Labs  Lab 05/03/18 1303 05/06/18 1300 05/08/18 0514  WBC 9.1 11.9* 8.1  NEUTROABS 6.3 9.4*  --   HGB 14.0 12.8 11.2*  HCT 46.1* 42.6 38.6  MCV 70.1* 70.6* 72.8*  PLT 341 324 962   Basic Metabolic Panel: Recent Labs  Lab 05/03/18 1303 05/06/18 1300 05/07/18 1350 05/08/18 0514  NA 130* 127* 131* 135  K 4.3 4.4 3.7 3.6  CL 101 97* 103 108  CO2 16* 18* 20* 19*  GLUCOSE 355* 355* 274* 186*  BUN '18 15 14 12  ' CREATININE 1.71* 1.54* 1.53* 1.53*  CALCIUM 9.8 9.7 9.1 8.8*  MG  --   --   --  2.1   HbA1C: Recent Labs    05/06/18 1300  HGBA1C 14.9*   CBG: Recent Labs  Lab 05/08/18 0755 05/08/18 1154 05/08/18 1802 05/08/18 2129 05/09/18 0742  GLUCAP 174* 266* 141* 224* 174*    Recent Results (from the past 240 hour(s))  Culture,  blood (Routine X 2) w Reflex to ID Panel     Status: None (Preliminary result)   Collection Time: 05/07/18  1:40 AM  Result Value Ref Range Status   Specimen Description   Final    BLOOD RIGHT ANTECUBITAL Performed at [facility], [place] [address] [place], [place] [address]    Special Requests   Final    BOTTLES DRAWN AEROBIC AND ANAEROBIC Blood Culture adequate volume Performed at [place] [address], [place], [place] [address]    Culture   Final    NO GROWTH 2 DAYS Performed at [place] [address], [place], [place] [address]    Report Status PENDING  Incomplete  Culture, blood (Routine X 2) w Reflex to ID Panel     Status: None (Preliminary result)   Collection Time: 05/07/18  3:31 AM  Result Value Ref Range Status   Specimen Description   Final    BLOOD LEFT HAND Performed at [facility], [place] [address], [place], [place] [address]    Special Requests   Final    BOTTLES DRAWN AEROBIC AND ANAEROBIC Blood Culture adequate volume Performed at [place] [address], [place], [place] [address]    Culture   Final    NO GROWTH 2 DAYS Performed at [place] [address] [place], [place] [address]    Report Status PENDING  Incomplete         Radiology Studies: Ct Head W & Wo Contrast  Result Date: 05/08/2018 CLINICAL DATA:  Seizure, new, abnormal neuro exam. EXAM: CT HEAD WITHOUT AND WITH CONTRAST TECHNIQUE: Contiguous axial images were obtained from the base of the skull through the vertex without and with intravenous contrast CONTRAST:  77m OMNIPAQUE IOHEXOL 300 MG/ML  SOLN COMPARISON:  CTA head and neck 05/08/2018.  MRI brain 05/06/2018. FINDINGS: Brain: No acute infarct, hemorrhage, or mass lesion is present. The ventricles are of normal size. No significant extraaxial fluid collection is present. The brainstem and cerebellum are within normal limits. Postcontrast images  demonstrate no pathologic enhancement. Vascular: No hyperdense vessel or unexpected calcification. Visible vessels are patent. Skull: Calvarium is intact. No focal lytic or blastic lesions are present. Sinuses/Orbits: The paranasal sinuses and mastoid air cells are clear. The globes and orbits are within normal limits. IMPRESSION: 1. Normal CT of the head without and with contrast. Electronically Signed   By: CSan MorelleM.D.   On: 05/08/2018 19:41        Scheduled Meds: . enoxaparin (LOVENOX) injection  60 mg Subcutaneous Daily  . fluticasone  2 spray Each Nare Daily  . insulin aspart  0-9 Units Subcutaneous Q4H  . insulin glargine  10 Units Subcutaneous Daily  . phenytoin (DILANTIN)  IV  100 mg Intravenous Q8H  . pregabalin  200 mg Oral TID   Continuous Infusions: . sodium chloride 75 mL/hr at 05/09/18 1023  . levETIRAcetam 1,500 mg (05/09/18 0853)  . PHENObarbital 1000 mg IVPB       LOS: 2 days     Vernell Leep, MD, FACP, [facility]. [facility]  To contact the attending provider between 7A-7P or the covering provider during after hours 7P-7A, please log into the web site [email or web address] and access using universal [place] password for that web site. If you do not have the password, please call the hospital operator.  05/09/2018, 11:30 AM

## 2018-05-09 NOTE — Procedures (Signed)
Central Venous Catheter Insertion Procedure Note Rebecca Johnston [id number] 1970/03/05  Procedure: Insertion of Central Venous Catheter Indications: Drug and/or fluid administration  Procedure Details Consent: Risks of procedure as well as the alternatives and risks of each were explained to the (patient/caregiver).  Consent for procedure obtained. Time Out: Verified patient identification, verified procedure, site/side was marked, verified correct patient position, special equipment/implants available, medications/allergies/relevent history reviewed, required imaging and test results available.  Performed  Maximum sterile technique was used including antiseptics, cap, gloves, gown, hand hygiene, mask and sheet. Skin prep: Chlorhexidine; local anesthetic administered A antimicrobial bonded/coated triple lumen catheter was placed in the right internal jugular vein using the Seldinger technique.  Procedure: Initially attempted L IJ placement under ultrasound however unable to pass guide wire. Guide wire from second kit was acquired however had kink therefore not usable. Decision was made to attempt R IJ CVC placement with new guide wire. Successfully placed CVC under ultrasound guidance.  Evaluation Blood flow good Complications: No apparent complications Patient did tolerate procedure well. Chest X-ray ordered to verify placement.  CXR: pending.  Rebecca Johnston 05/09/2018, 6:41 PM

## 2018-05-09 NOTE — Progress Notes (Signed)
Called to speak with RN re PICC order.  MD at bedside to place CVC.

## 2018-05-09 NOTE — Progress Notes (Signed)
PT Cancellation Note  Patient Details Name: Rebecca Johnston MRN: [id number] DOB: [birth year]   Cancelled Treatment:    Reason Eval/Treat Not Completed: Medical issues which prohibited therapy(Ongoing seizures, transfer to ICU today)  Spoke with Dr. Algis Liming who agreed to hold therapies for today.   Melvern Banker 05/09/2018, 10:39 AM  Lavonia Dana, PT   Acute Rehabilitation Services  Pager [phone] Office [phone] 05/09/2018

## 2018-05-09 NOTE — Procedures (Addendum)
Intubation Procedure Note Rebecca Johnston [id number] 13-Jan-1971  Procedure: Intubation Indications: Airway protection and maintenance  Procedure Details Consent: Risks of procedure as well as the alternatives and risks of each were explained to the (patient/caregiver).  Consent for procedure obtained. (Mother: Angeliah Wisdom) Time Out: Verified patient identification, verified procedure, site/side was marked, verified correct patient position, special equipment/implants available, medications/allergies/relevent history reviewed, required imaging and test results available.  Performed  Maximum sterile technique was used including gloves and mask.  3 Glidescope with MAC 3 used with grade I view. ETT visualized passing through vocal cords. Breath sounds present bilaterally. CO2 detector color change present.  Evaluation Hemodynamic Status: BP stable throughout; O2 sats: stable throughout Patient's Current Condition: stable Complications: No apparent complications Patient did tolerate procedure well. Chest X-ray ordered to verify placement.  CXR: pending.   Rebecca Johnston 05/09/2018

## 2018-05-09 NOTE — Consult Note (Signed)
NAME:  Rebecca Johnston, MRN:  [id number], DOB:  1970/09/01, LOS: 2 ADMISSION DATE:  05/06/2018, CONSULTATION DATE: 05/09/2018 REFERRING MD:  Vernell Leep, MD CHIEF COMPLAINT: Status epilepticus  Brief History   48 year old female transferred from [facility] for LTM monitoring.  Transferred to ICU for EEG demonstrating multiple right temporal seizures.  PCCM consulted.  History of present illness   48 year old female who presented to [place] along with headache, blurred vision, photophobia and dizziness as well as episodes of head turning.  Neurology was consulted and EEG demonstrated partial seizures however this was initially thought to be artifact and her head turning was thought to be dystonia related to medications.  During hospitalization, patient continued to have head turning episodes associated with confusion.  She remained awake unable to protect airway.  She was started on Keppra and transferred to [facility] for LTM.  LTM demonstrated multiple right temporal seizures.  She was loaded with fosphenytoin and started on Dilantin.  Transferred to ICU for closer monitoring  Past Medical History  CKD stage III, diabetes type 2, asthma, morbid obesity  Significant Hospital Events   3/5 admitted 3/8 Transfer to ICU Consults:  Neuro  Procedures:  EEG 3/6 right temporal complex partial seizure  Significant Diagnostic Tests:  MR brain 3/5-no stroke, hemorrhage or mass seen CT head/CTA head and neck 3/6-read as normal CT head 3/7-no acute intracranial abnormalities  Micro Data:  BCX 3/6 >  Antimicrobials:    Interim history/subjective:  Reports headaches improved. However feels drowsy. Family at bedside including mother and aunt reports episodes of limb jerking <65mn followed by sleepiness. When patient is awake, she is oriented x 3 and appropriate.  Objective   Blood pressure 119/67, pulse 82, temperature 98.2 F (36.8 C), temperature source Oral, resp. rate 20, height 5' 5" (1.651  m), weight 118.4 kg, last menstrual period 11/26/2014, SpO2 100 %.        Intake/Output Summary (Last 24 hours) at 05/09/2018 1005 Last data filed at 05/09/2018 0853 Gross per 24 hour  Intake 1190 ml  Output 1800 ml  Net -610 ml   Filed Weights   05/07/18 0307  Weight: 118.4 kg   Physical Exam: General: Morbidly obese, drowsy, laying in bed HENT: [place], AT, OP clear, MMM, Mallampati IV Eyes: EOMI, no scleral icterus Respiratory: Difficult to auscultate. No crackles, wheezing or rales Cardiovascular: RRR, -M/R/G, no JVD GI: BS+, soft, nontender Extremities:-Edema,-tenderness Neuro: Very drowsy, AAO x4, CNII-XII grossly intact Skin: Intact, no rashes or bruising Psych: Drowsy mood, slurred affect GU: No foley in place   Resolved Hospital Problem list     Assessment & Plan:   Status epilepticus -AED management per neurology: Keppra, fosphenytoin load, Dilantin -PRN Ativan for breakthrough seizures -Continuous EEG -Seizure precautions  Migraines -IV DHE x3 days -PRN Tylenol -Avoid NSAIDs due to renal insufficiency  Diabetes mellitus type 2 HG A1c >14 -Diabetic coordinator consulted -Lantus 10 units daily -SSI  Best practice:  Diet: NPO Pain/Anxiety/Delirium protocol (if indicated): None VAP protocol (if indicated): No DVT prophylaxis: Lovenox GI prophylaxis: Pepcid Glucose control: As noted above Mobility: BR Code Status: Full. OK with intubation Family Communication: Patient and mother at bedside. Disposition: Transfer to ICU  Labs   CBC: Recent Labs  Lab 05/03/18 1303 05/06/18 1300 05/08/18 0514  WBC 9.1 11.9* 8.1  NEUTROABS 6.3 9.4*  --   HGB 14.0 12.8 11.2*  HCT 46.1* 42.6 38.6  MCV 70.1* 70.6* 72.8*  PLT 341 324 257  Basic Metabolic Panel: Recent Labs  Lab 05/03/18 1303 05/06/18 1300 05/07/18 1350 05/08/18 0514  NA 130* 127* 131* 135  K 4.3 4.4 3.7 3.6  CL 101 97* 103 108  CO2 16* 18* 20* 19*  GLUCOSE 355* 355* 274* 186*  BUN _0 CREATININE 1.71* 1.54* 1.53* 1.53*  CALCIUM 9.8 9.7 9.1 8.8*  MG  --   --   --  2.1   GFR: Estimated Creatinine Clearance: 57.9 mL/min (A) (by C-G formula based on SCr of 1.53 mg/dL (H)). Recent Labs  Lab 05/03/18 1303 05/06/18 1300 05/08/18 0514  WBC 9.1 11.9* 8.1    Liver Function Tests: No results for input(s): AST, ALT, ALKPHOS, BILITOT, PROT, ALBUMIN in the last 168 hours. No results for input(s): LIPASE, AMYLASE in the last 168 hours. No results for input(s): AMMONIA in the last 168 hours.  ABG    Component Value Date/Time   HCO3 26.2 10/22/2016 1718   TCO2 28 10/22/2016 1718   O2SAT 59.0 10/22/2016 1718     Coagulation Profile: No results for input(s): INR, PROTIME in the last 168 hours.  Cardiac Enzymes: No results for input(s): CKTOTAL, CKMB, CKMBINDEX, TROPONINI in the last 168 hours.  HbA1C: HbA1c, POC (prediabetic range)  Date/Time Value Ref Range Status  11/06/2017 04:32 PM 6.2 5.7 - 6.4 % Final   Hgb A1c MFr Bld  Date/Time Value Ref Range Status  05/06/2018 01:00 PM 14.9 (H) 4.8 - 5.6 % Final    Comment:    (NOTE) Pre diabetes:          5.7%-6.4% Diabetes:              >6.4% Glycemic control for   <7.0% adults with diabetes   04/16/2017 03:01 PM 6.2 (H) 4.8 - 5.6 % Final    Comment:             Prediabetes: 5.7 - 6.4          Diabetes: >6.4          Glycemic control for adults with diabetes: <7.0     CBG: Recent Labs  Lab 05/08/18 0755 05/08/18 1154 05/08/18 1802 05/08/18 2129 05/09/18 0742  GLUCAP 174* 266* 141* 224* 174*    Review of Systems:   Review of Systems  Constitutional: Positive for malaise/fatigue. Negative for chills, diaphoresis, fever and weight loss.  HENT: Positive for congestion. Negative for ear pain and sore throat.   Eyes: Positive for blurred vision and photophobia.  Respiratory: Negative for cough, hemoptysis, sputum production, shortness of breath and wheezing.   Cardiovascular: Negative for chest  pain, palpitations and leg swelling.  Gastrointestinal: Negative for abdominal pain, heartburn and nausea.  Genitourinary: Negative for frequency.  Musculoskeletal: Negative for joint pain and myalgias.  Skin: Negative for itching and rash.  Neurological: Positive for seizures, loss of consciousness and headaches. Negative for dizziness and weakness.  Endo/Heme/Allergies: Does not bruise/bleed easily.  Psychiatric/Behavioral: Negative for depression. The patient is not nervous/anxious.    Past Medical History  She,  has a past medical history of Anemia, Asthma, Diabetes mellitus without complication ([place]) (39/7673), Fibroids, GERD (gastroesophageal reflux disease), History of blood transfusion (Feb 2015), Obesity, Class III, BMI 40-49.9 (morbid obesity) ([place]), and Shortness of breath dyspnea.   Surgical History    Past Surgical History:  Procedure Laterality Date  . ABDOMINAL HYSTERECTOMY N/A 12/08/2014   Procedure: HYSTERECTOMY ABDOMINAL;  Surgeon: Woodroe Mode, MD;  Location: [facility];  Service: Gynecology;  Laterality: N/A;  . DILATION AND CURETTAGE OF UTERUS    . DILITATION & CURRETTAGE/HYSTROSCOPY WITH VERSAPOINT RESECTION N/A 01/06/2014   Procedure: Corena Pilgrim WITH VERSAPOINT;  Surgeon: Woodroe Mode, MD;  Location: [facility];  Service: Gynecology;  Laterality: N/A;  . LAPAROSCOPIC LYSIS OF ADHESIONS N/A 12/08/2014   Procedure: LAPAROSCOPIC LYSIS OF ADHESIONS;  Surgeon: Michael Boston, MD;  Location: [facility];  Service: General;  Laterality: N/A;  . OVARIAN CYST REMOVAL     cyst not removed. Cyst drained  . SUPRA-UMBILICAL HERNIA N/A 71/08/9676   Procedure: LAPARSCOPIC SUPRA-UMBILICAL HERNIA REPAIR ;  Surgeon: Michael Boston, MD;  Location: [facility];  Service: General;  Laterality: N/A;  . VENTRAL HERNIA REPAIR N/A 12/08/2014   Procedure: LAPAROSCOPIC INCARCERATED INCISIONAL VENTRAL WALL HERNIA REPAIR;  Surgeon: Michael Boston, MD;  Location: [facility];  Service: General;  Laterality: N/A;      Social History   reports that she quit smoking about 2 years ago. Her smoking use included cigarettes. She has a 6.25 pack-year smoking history. She has never used smokeless tobacco. She reports current alcohol use. She reports that she does not use drugs.   Family History   Her family history includes Breast cancer in her maternal aunt; Diabetes in her father, maternal aunt, maternal aunt, maternal aunt, and paternal aunt; Hypertension in her mother.   Allergies Allergies  Allergen Reactions  . Doxycycline     "bad dreaming, hallucination, dizziness" per pt     Home Medications  Prior to Admission medications   Medication Sig Start Date End Date Taking? Authorizing Provider  aspirin-acetaminophen-caffeine (EXCEDRIN MIGRAINE) [phone] MG tablet Take 2 tablets by mouth every 6 (six) hours as needed for headache.   Yes [provider]  cetirizine (ZYRTEC) 10 MG tablet Take 1 tablet (10 mg total) by mouth daily. Patient taking differently: Take 10 mg by mouth daily as needed for allergies.  04/16/17  Yes Ladell Pier, MD  doxycycline (VIBRAMYCIN) 100 MG capsule Take 1 capsule (100 mg total) by mouth 2 (two) times daily. 04/29/18  Yes Maudie Flakes, MD  fluticasone (FLONASE) 50 MCG/ACT nasal spray USE 2 SPRAY(S) IN EACH NOSTRIL ONCE DAILY Patient taking differently: Place 2 sprays into both nostrils daily.  04/26/18  Yes Ladell Pier, MD  gabapentin (NEURONTIN) 100 MG capsule Take 100 mg by mouth 1-3 times daily as needed for nerve pains Patient taking differently: Take 100 mg by mouth 3 (three) times daily as needed (nerve pain).  04/04/16  Yes Funches, Josalyn, MD  glimepiride (AMARYL) 2 MG tablet Take 1 tablet (2 mg total) by mouth daily before breakfast. MUST MAKE APPT FOR FURTHER REFILLS 04/26/18  Yes Ladell Pier, MD  ranitidine (ZANTAC) 300 MG tablet 1 tab PO QHS PRN Patient taking differently: Take 300 mg by mouth at bedtime as needed for heartburn.  04/16/17   Yes Ladell Pier, MD  ACCU-CHEK SOFTCLIX LANCETS lancets 1 each by Other route 2 (two) times daily. 12/27/15   Funches, Adriana Mccallum, MD  Blood Glucose Monitoring Suppl (ACCU-CHEK AVIVA PLUS) w/Device KIT 1 Device by Does not apply route 2 (two) times daily. 01/02/17   Tresa Garter, MD  glucose blood (ACCU-CHEK AVIVA PLUS) test strip 1 each by Other route 2 (two) times daily. MUST MAKE APPT FOR FURTHER REFILLS 04/26/18   Ladell Pier, MD  mupirocin ointment (BACTROBAN) 2 % Apply 1 application topically 2 (two) times daily. Patient not taking: Reported on 04/29/2018 04/22/17   Wynetta Emery,  Dalbert Batman, MD  PROAIR HFA 108 [phone] Base) MCG/ACT inhaler Inhale 2 puffs into the lungs every 6 (six) hours as needed for wheezing or shortness of breath. Patient not taking: Reported on 05/06/2018 04/17/17   Ladell Pier, MD     Critical care time: 38 min  The patient is critically ill with multiple organ systems failure and requires high complexity decision making for assessment and support, frequent evaluation and titration of therapies, application of advanced monitoring technologies and extensive interpretation of multiple databases.   Critical Care Time devoted to patient care services described in this note is 38 Minutes. This time reflects time of care of this signee Dr. Rodman Pickle. This critical care time does not reflect procedure time, or teaching time or supervisory time of PA/NP/Med student/Med Resident etc but could involve care discussion time.  Rodman Pickle, M.D. [facility] Pulmonary/Critical Care Medicine Pager: [phone] After hours pager: [phone]

## 2018-05-09 NOTE — Progress Notes (Signed)
Walked in pt rm about 1400 and pt was obtunded only responsive to pain, MD aware, will intubate

## 2018-05-09 NOTE — Progress Notes (Signed)
Pt was moved to 4N - the ground electrode has come off - tech replaced it and performed maint as need.

## 2018-05-09 NOTE — Progress Notes (Signed)
Pt had 15 sec episode, L gaze w./ eye twitching, and L leg twitching. Lost IV so could not give ativan.  MD notified

## 2018-05-09 NOTE — Procedures (Signed)
Intubation Procedure Note Rebecca Johnston [id number] 1971/01/06  Procedure: Intubation Indications: Airway protection and maintenance  Procedure Details Consent: Unable to obtain consent because of altered level of consciousness. Time Out: Verified patient identification, verified procedure, site/side was marked, verified correct patient position, special equipment/implants available, medications/allergies/relevent history reviewed, required imaging and test results available.  Performed  Maximum sterile technique was used including antiseptics, cap, gloves, gown, hand hygiene and mask.  MAC and 3    Evaluation Hemodynamic Status: BP stable throughout; O2 sats: stable throughout Patient's Current Condition: stable Complications: No apparent complications Patient did tolerate procedure well. Chest X-ray ordered to verify placement.  CXR: pending.   Sharla Kidney 05/09/2018

## 2018-05-09 NOTE — Progress Notes (Signed)
  Echocardiogram 2D Echocardiogram has been performed.  Rebecca Johnston 05/09/2018, 4:40 PM

## 2018-05-09 NOTE — Progress Notes (Signed)
Patient started seizing left leg and turning left head. hospitalist in to see patient. Talked to mother and patient. Patient somnolent speaking appropriately but falling to sleep easily. Has had 3 seizures in last hour/ drinking fluids and tolerating.  Plan to move to ICU for further treatment.

## 2018-05-09 NOTE — Procedures (Signed)
LTM-EEG Report  HISTORY: Continuous video-EEG monitoring performed for63year old with non-convulsive seizures. ACQUISITION: International 10-20 system for electrode placement; 18 channels with additional eyes linked to ipsilateral ears and EKG. Additional T1-T2 electrodes were used. Continuous video recording obtained.   EEG NUMBER:  MEDICATIONS:  Day 1:see EMR  DAY #1:from 1642 05/08/18 to 0730 05/09/18. Study recorded locally after 2145 BACKGROUND: This was a medium voltage, continuous recording with good spontaneous variability and reactivity. The background consisted of a medium voltage 9-10Hz  posterior dominant rhythm with some intermixed theta and beta activity. State changes were captured with normal stage II sleep architecture. There were frequent non-convulsive seizures throughout the record as described below.  EPILEPTIFORM/PERIODIC ACTIVITY:no clear interictal discharges SEIZURES:Frequent, recurrent brief subclinical or subtle clinical seizures arising from the right posterior temporal region with onset of rapid spikes, buildup in frequency, spread through the right hemisphere, then slowing over 30-60 seconds duration. These occurred every 10-20 minutes and were unchanged overnight. Some were reported with left gaze deviation but others had no clear clinical signs. 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 Some seizures showed subtle clinical signs with left gaze deviation. Seizures were ongoing overnight.

## 2018-05-09 NOTE — Progress Notes (Signed)
[facility] Progress Note Patient Name: SHANNELL MIKKELSEN DOB: 1970/11/30 MRN: [id number]   Date of Service  05/09/2018  HPI/Events of Note  CXR post R IJ CVL placement - Right internal jugular central venous catheter terminates in the lower third of the SVC. No pneumothorax.   eICU Interventions  OK to use R IJ CVL.     Intervention Category Intermediate Interventions: Diagnostic test evaluation  Jerilee Space Eugene 05/09/2018, 10:12 PM

## 2018-05-09 NOTE — Progress Notes (Addendum)
Reason for consult: Focal status epilepticus  Subjective: Patient more awake and alert this morning.  Said headache has resolved.  Nurse has not noticed any further head turning episodes since her shift since 7 AM and patient denies remembering them this morning.  Her left leg strength is improved.  Extraocular movements intact and no neglect.  ROS: negative except above   Examination  Vital signs in last 24 hours: Temp:  [97.5 F (36.4 C)-98.4 F (36.9 C)] 98.2 F (36.8 C) (03/08 0900) Pulse Rate:  [72-95] 82 (03/08 0900) Resp:  [18-23] 20 (03/08 0436) BP: (114-126)/(67-89) 119/67 (03/08 0900) SpO2:  [98 %-100 %] 100 % (03/08 0900)  General: lying in bed CVS: pulse-normal rate and rhythm RS: breathing comfortably Extremities: normal   Neuro: MS: Alert, oriented, follows commands CN: pupils equal and reactive,  EOMI, face symmetric, tongue midline Motor: 5/5 strength in RUE and RLE, LUE 4/5 and LLE 3+/5 Coordination: normal Gait: not tested  Basic Metabolic Panel: Recent Labs  Lab 05/03/18 1303 05/06/18 1300 05/07/18 1350 05/08/18 0514  NA 130* 127* 131* 135  K 4.3 4.4 3.7 3.6  CL 101 97* 103 108  CO2 16* 18* 20* 19*  GLUCOSE 355* 355* 274* 186*  BUN 18 15 14 12   CREATININE 1.71* 1.54* 1.53* 1.53*  CALCIUM 9.8 9.7 9.1 8.8*  MG  --   --   --  2.1    CBC: Recent Labs  Lab 05/03/18 1303 05/06/18 1300 05/08/18 0514  WBC 9.1 11.9* 8.1  NEUTROABS 6.3 9.4*  --   HGB 14.0 12.8 11.2*  HCT 46.1* 42.6 38.6  MCV 70.1* 70.6* 72.8*  PLT 341 324 257     Coagulation Studies: No results for input(s): LABPROT, INR in the last 72 hours.     ASSESSMENT AND PLAN  48 year old with history of CKD stage III, obesity, diabetes mellitus type 2, asthma came to the hospital with complains of headache, blurry vision and dizziness. Had multiple episodes of head turning initially thought to be seizures. Evaluated by neurology and felt more likely this are dystonic reactions  due to compazine/meclizine given for headache. She was started on DHE. EEG was obtained and was concerning for partial seizures, however the neurologist who assessed patient felt they were likely artifact. MRI Brain performed shows no acute findings.   I received a call today by hospitalist stating patietn continues to have multiple episodes, is slightly confused but awake. Recommended transfer to [facility] for continous EEG and recommended loading with Keppra 2g.  On arrival to [facility], she continued to have these spells of head turning/gaze deviation.  Patient was placed on continuous EEG and confirmed these are right hemispheric seizures, most likely arising from posterior temporal lobe.  Patient was loaded with fosphenytoin and started on Dilantin, however continued to have seizures overnight.  Was given one-time load of Vimpat and started on Lyrica this morning. Repeated CT head with contrast, no acute changes. No enhancing lesion noted.  I reviewed the MRI brain again with Dr. Leonel Ramsay and there does appear to be some T2 hypointensity (not hyperintensity) in the right occipital lobe that can be sometimes seen in the setting of severe hyperglycemia.  She did have one glucose reading of 744 10 days ago and does say the symptoms have been going on for roughly a week.  All patient is clinically improved and has improved strength in the left lower extremity, EEG still concerning for focal status.  She has a further seizure later  this morning with gaze deviation.    Refractory focal status epilepticus Todd's paresis  ? Heart block on telemetry - EKG sinus rhythm   Etiology: Possible severe hyperglycemia, ?  T2 hypodensity noted in the right occipital lobe  Recommendations Transfer to Neuro ICU for close monitoring of respiratory status, no need to intubate currently We will load with phenobarbital 10 mg/kg-pharmacy consult placed Continue Keppra, dose increased to 1500 mg twice daily Continue  Dilantin, follow levels today Continue Lyrica 200 mg 3 times daily Would avoid Vimpat due to questionable heart block noted on telemetry Continue long-term EEG, appreciate epileptologist assistance in reviewing EEG and case discussed with him Control hyperglycemia  Repeat MRI brain with contrast after EEG leads removed tomorrow   Continue to monitor EEG multiple times during the day as well as monitor clinically.  Addendum Patient required intubation after being moved to the ICU after she received phenobarbital for airway protection.  Continue to have seizures despite starting phenobarbital.  Started propofol at 40 MCG/KG/hour.  No further seizures since.    This patient is neurologically critically ill due to focal status epilepticus. She is at risk for significant risk of neurological worsening from progression of seizures/status, heart failure, infection, respiratory failure and sudden death in epilepsy.  This patient's care requires constant monitoring of vital signs, reviewing EEG,  hemodynamics, respiratory and cardiac monitoring, review of multiple databases, neurological assessment  and medical decision making of high complexity.  I spent 60  minutes neuro critical care time with this patient.  Karena Addison Mylo Driskill [facility] Pager Number [phone] For questions after 7pm please refer to AMION to reach the Neurologist on call

## 2018-05-09 NOTE — Progress Notes (Signed)
OT Cancellation Note  Patient Details Name: Rebecca Johnston MRN: [id number] DOB: October 13, 1970   Cancelled Treatment:    Reason Eval/Treat Not Completed: Patient not medically ready.  Pt transferred to ICU and now intubated.  Will check back to determine appropriateness.  Lucille Passy, OTR/L [place] Pager [phone] Office [phone]   Lucille Passy M 05/09/2018, 3:27 PM

## 2018-05-09 NOTE — Progress Notes (Signed)
[facility] Progress Note Patient Name: Rebecca Johnston DOB: 29-Jul-1970 MRN: [id number]   Date of Service  05/09/2018  HPI/Events of Note  Patient is intubated and ventilated. No orders for mechanical ventilation.   eICU Interventions  Will order: 1. 50%/PRVC 18/TV 18/P 5  2. ABG at 5 AM.     Intervention Category Major Interventions: Respiratory failure - evaluation and management  Sommer,Steven Cornelia Copa 05/09/2018, 10:55 PM

## 2018-05-09 NOTE — Progress Notes (Signed)
Pt's BP 60/41 (49) at 1515, retaken at 1523 and was 40/30 (35)- fentanyl turned off from 100 mcg, MD paged order for levophed.  BP started to increase after fentanyl turned off, levo was not started.  Kept fentanyl off

## 2018-05-10 LAB — PHOSPHORUS
Phosphorus: 3.9 mg/dL (ref 2.5–4.6)
Phosphorus: 4 mg/dL (ref 2.5–4.6)

## 2018-05-10 LAB — CBC
HEMATOCRIT: 39.1 % (ref 36.0–46.0)
Hemoglobin: 11.3 g/dL — ABNORMAL LOW (ref 12.0–15.0)
MCH: 20.8 pg — ABNORMAL LOW (ref 26.0–34.0)
MCHC: 28.9 g/dL — ABNORMAL LOW (ref 30.0–36.0)
MCV: 72.1 fL — ABNORMAL LOW (ref 80.0–100.0)
Platelets: 358 10*3/uL (ref 150–400)
RBC: 5.42 MIL/uL — ABNORMAL HIGH (ref 3.87–5.11)
RDW: 16.2 % — ABNORMAL HIGH (ref 11.5–15.5)
WBC: 11.9 10*3/uL — ABNORMAL HIGH (ref 4.0–10.5)
nRBC: 0 % (ref 0.0–0.2)

## 2018-05-10 LAB — POCT I-STAT 7, (LYTES, BLD GAS, ICA,H+H)
Acid-base deficit: 5 mmol/L — ABNORMAL HIGH (ref 0.0–2.0)
Bicarbonate: 20.3 mmol/L (ref 20.0–28.0)
CALCIUM ION: 1.3 mmol/L (ref 1.15–1.40)
HCT: 35 % — ABNORMAL LOW (ref 36.0–46.0)
Hemoglobin: 11.9 g/dL — ABNORMAL LOW (ref 12.0–15.0)
O2 SAT: 100 %
PCO2 ART: 37.4 mmHg (ref 32.0–48.0)
Potassium: 3.7 mmol/L (ref 3.5–5.1)
Sodium: 139 mmol/L (ref 135–145)
TCO2: 21 mmol/L — ABNORMAL LOW (ref 22–32)
pH, Arterial: 7.342 — ABNORMAL LOW (ref 7.350–7.450)
pO2, Arterial: 217 mmHg — ABNORMAL HIGH (ref 83.0–108.0)

## 2018-05-10 LAB — GLUCOSE, CAPILLARY
GLUCOSE-CAPILLARY: 122 mg/dL — AB (ref 70–99)
Glucose-Capillary: 171 mg/dL — ABNORMAL HIGH (ref 70–99)
Glucose-Capillary: 162 mg/dL — ABNORMAL HIGH (ref 70–99)
Glucose-Capillary: 180 mg/dL — ABNORMAL HIGH (ref 70–99)
Glucose-Capillary: 147 mg/dL — ABNORMAL HIGH (ref 70–99)
Glucose-Capillary: 110 mg/dL — ABNORMAL HIGH (ref 70–99)

## 2018-05-10 LAB — MAGNESIUM
Magnesium: 2.1 mg/dL (ref 1.7–2.4)
Magnesium: 2.2 mg/dL (ref 1.7–2.4)
Magnesium: 2.3 mg/dL (ref 1.7–2.4)

## 2018-05-10 LAB — URINALYSIS, ROUTINE W REFLEX MICROSCOPIC
Bilirubin Urine: NEGATIVE
GLUCOSE, UA: NEGATIVE mg/dL
Hgb urine dipstick: NEGATIVE
Ketones, ur: NEGATIVE mg/dL
Leukocytes,Ua: NEGATIVE
Nitrite: NEGATIVE
Protein, ur: NEGATIVE mg/dL
Specific Gravity, Urine: 1.016 (ref 1.005–1.030)
pH: 5 (ref 5.0–8.0)

## 2018-05-10 LAB — BASIC METABOLIC PANEL
Anion gap: 7 (ref 5–15)
BUN: 8 mg/dL (ref 6–20)
CO2: 20 mmol/L — ABNORMAL LOW (ref 22–32)
Calcium: 8.1 mg/dL — ABNORMAL LOW (ref 8.9–10.3)
Chloride: 112 mmol/L — ABNORMAL HIGH (ref 98–111)
Creatinine, Ser: 1.47 mg/dL — ABNORMAL HIGH (ref 0.44–1.00)
GFR calc Af Amer: 48 mL/min — ABNORMAL LOW (ref >60)
GFR calc non Af Amer: 42 mL/min — ABNORMAL LOW (ref >60)
Glucose, Bld: 173 mg/dL — ABNORMAL HIGH (ref 70–99)
Potassium: 3.8 mmol/L (ref 3.5–5.1)
Sodium: 139 mmol/L (ref 135–145)

## 2018-05-10 MED ORDER — PHENOBARBITAL SODIUM 65 MG/ML IJ SOLN
65.0000 mg | Freq: Every day | INTRAMUSCULAR | Status: DC
  Administered 2018-05-10 – 2018-05-11 (×2): 65 mg via INTRAVENOUS
  Filled 2018-05-10 (×2): qty 1

## 2018-05-10 MED ORDER — FREE WATER
200.0000 mL | Freq: Three times a day (TID) | Status: DC
  Administered 2018-05-10 – 2018-05-11 (×3): 200 mL

## 2018-05-10 MED ORDER — VITAL HIGH PROTEIN PO LIQD
1000.0000 mL | ORAL | Status: DC
  Administered 2018-05-10: 1000 mL

## 2018-05-10 MED ORDER — VITAL HIGH PROTEIN PO LIQD: 1000.0000 mL | ORAL | Status: DC

## 2018-05-10 MED ORDER — FENTANYL BOLUS VIA INFUSION
50.0000 ug | INTRAVENOUS | Status: DC | PRN
  Filled 2018-05-10: qty 50

## 2018-05-10 MED ORDER — PRO-STAT SUGAR FREE PO LIQD: 30.0000 mL | Freq: Two times a day (BID) | ORAL | Status: DC

## 2018-05-10 MED ORDER — PANTOPRAZOLE SODIUM 40 MG PO PACK
40.0000 mg | PACK | Freq: Every day | ORAL | Status: DC
  Administered 2018-05-10 – 2018-05-11 (×2): 40 mg
  Filled 2018-05-10 (×2): qty 20

## 2018-05-10 NOTE — Progress Notes (Signed)
PT Cancellation Note  Patient Details Name: Rebecca Johnston MRN: [id number] DOB: 10-10-1970   Cancelled Treatment:    Reason Eval/Treat Not Completed: Patient not medically ready. Pt remains intubated, propofol was increased last night, in the process of decreasing now. Acute PT To return as able and appropriate to complete PT evaluation.  Kittie Plater, PT, DPT Acute Rehabilitation Services Pager #: [phone] Office #: [phone]    Berline Lopes 05/10/2018, 9:52 AM

## 2018-05-10 NOTE — Progress Notes (Addendum)
NAME:  Rebecca Johnston, MRN:  [id number], DOB:  1970/10/31, LOS: 3 ADMISSION DATE:  05/06/2018, CONSULTATION DATE: 05/09/2018 REFERRING MD:  Vernell Leep, MD CHIEF COMPLAINT: Status epilepticus  Brief History   48 year old female transferred from [facility] for LTM monitoring.  Transferred to ICU for EEG demonstrating multiple right temporal seizures.  PCCM consulted.   Past Medical History  CKD stage III, diabetes type 2, asthma, morbid obesity  Significant Hospital Events   3/5 admitted 3/8 Transfer to ICU; Reports headaches improved. However feels drowsy. Family at bedside including mother and aunt reports episodes of limb jerking <94min followed by sleepiness. Was intubated for airway protection. Required nor-epi for hypotension felt d/t propofol and phenobarb load.  3/9 titrating propofol down watching LTM for seizure activity.   Consults:  Neuro  Procedures:  EEG 3/6 right temporal complex partial seizure  Significant Diagnostic Tests:  MR brain 3/5-no stroke, hemorrhage or mass seen CT head/CTA head and neck 3/6-read as normal CT head 3/7-no acute intracranial abnormalities  Micro Data:  BCX 3/6 >  Antimicrobials:    Interim history/subjective:  sedated  Objective   Blood pressure 123/87, pulse 63, temperature 97.7 F (36.5 C), temperature source Axillary, resp. rate 18, height 5\' 5"  (1.651 m), weight 118.4 kg, last menstrual period 11/26/2014, SpO2 100 %.    Vent Mode: PRVC FiO2 (%):  [30 %-100 %] 30 % Set Rate:  [18 bmp] 18 bmp Vt Set:  [450 mL] 450 mL PEEP:  [5 cmH20] 5 cmH20 Plateau Pressure:  [15 cmH20-20 cmH20] 20 cmH20   Intake/Output Summary (Last 24 hours) at 05/10/2018 1018 Last data filed at 05/10/2018 0900 Gross per 24 hour  Intake 2429.03 ml  Output 2125 ml  Net 304.03 ml   Filed Weights   05/07/18 0307  Weight: 118.4 kg   Physical Exam:  General 48 year old female; sedated on full vent support HENT NCAT orally intubated. EEG head gear in  place pulm clear decreased bases Card RRR no MRG abd not tender + bowel sounds GU clear yellow Neuro sedated. 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  Tapering prop per neuro  Acute respiratory failure pcxr reviewed: left basilar atx. Lowe lung volumes Plan Cont full vent support Pad protocol; rass goal 0 once we are sure no further seizures VAP bundle  F/u cxr am   Drug induced hypotension Plan Keep euvolemic Titrate norepi for MAP > 65  CKD stage III; looks like baseline ~ 1.6 Plan Trend chem Avoid hypotension Renal dose meds  Fluid and electrolyte imbalance: hyperchloremia and mild NAG metabolic acidosis  Plan Decrease NS Add free water via tube Am chemistry   Diabetes mellitus type 2 w/ hyperglycemia  HG A1c >14 Plan  ssi   H/o GERD Plan PPI   Best practice:  Diet: NPO Pain/Anxiety/Delirium protocol (if indicated): None VAP protocol (if indicated): No DVT prophylaxis: Lovenox GI prophylaxis: Pepcid Glucose control: As noted above Mobility: BR Code Status: Full. OK with intubation Family Communication: Patient and mother at bedside. Disposition: remains critically ill due to  acute respiratory failure requiring titration of ventilatory support. Status epilepticus (now seemingly resolved) requiring titration of AEDs currently slowly titrating propofol and watching LTM to assure no new seizure.    Erick Colace ACNP-BC [place] Pager # [phone] OR # [phone] if no answer  Critical care attending attestation note:  Patient seen and examined  and relevant ancillary tests reviewed.  I agree with the assessment and plan of care as outlined by Jerrye Bushy, NP. This patient was not seen as a shared visit. The following reflects my independent critical care time.  Patient remains critically ill due to respiratory failure requiring mechanical ventilation. Sedation has been stopped and cEEG shows increasingly normal pattern.  Patient remains unresponsive but with no seizure activity.  Continue current AED's and full ventilator support and allow sedation washout. Monitor for recurrent seizure activity.  Total critical care time: 40 min including chart data review, examination of patient, multidisciplinary rounds, and frequent assessment and modification of ventilator settings.Kipp Brood, MD Staff Critical Care Physician [facility] Pager: [phone] Off hours: [phone]  05/10/2018, 2:40 PM

## 2018-05-10 NOTE — Progress Notes (Signed)
OT Cancellation Note  Patient Details Name: Rebecca Johnston MRN: [id number] DOB: Jan 17, 1971   Cancelled Treatment:    Reason Eval/Treat Not Completed: Patient not medically ready.  Pt remains intubated and sedated.  Will reattempt.  Lucille Passy, OTR/L Acute Rehabilitation Services Pager [phone] Office [phone]   Lucille Passy M 05/10/2018, 10:24 AM

## 2018-05-10 NOTE — Progress Notes (Signed)
EEG Maint complete. No skin breakdown. Continue to monitor °

## 2018-05-10 NOTE — Procedures (Signed)
Cortrak  Person Inserting Tube:  Willey Blade E, RD Tube Type:  Cortrak - 43 inches Tube Location:  Right nare Initial Placement:  Stomach Secured by: Bridle Technique Used to Measure Tube Placement:  Documented cm marking at nare/ corner of mouth Cortrak Secured At:  63 cm    Cortrak Tube Team Note:  Consult received to place a Cortrak feeding tube.   No x-ray is required. RN may begin using tube.   If the tube becomes dislodged please keep the tube and contact the Cortrak team at [email or web address] ([email or web address]) for replacement.  If after hours and replacement cannot be delayed, place a NG tube and confirm placement with an abdominal x-ray.   Willey Blade, MS, [place], LDN Office: [phone] Pager: [phone] After Hours/Weekend Pager: [phone]

## 2018-05-10 NOTE — Progress Notes (Addendum)
Subjective: Brief review of presentation: 48 year old female initially presenting to [facility] with headache, photophobia, blurred vision and episodes of stereotyped head turning initially thought to be a dystonic reaction to medications. Home meds included Neurontin. She stated she had been having these head turning episodes for over a week. She has no history of seizures. For her headache, she was started on DHE. EEG was obtained to evaluate for possible partial seizure as the etiology for her head turning spells, but was inconclusive. She was loaded with Kepra on 3/7. Also received a loading dose of Vimpat on 3/7 but this was not continued due to questionable heart block on telemetry. MRI brain showed no acute abnormality. She continued to have multiple head turning episodes and was transferred to [facility] for LTM, which revealed multiple right temporal lobe seizures. She was loaded with fosphenytoin and transferred to the ICU for closer monitoring. Keppra was continuted at 1000 mg BID. DHE was discontinued as headache was felt most likely to be a postictal phenomenon. On 3/8 she was transferred to the ICU for close monitoring of respiratory status. She was loaded with phenobarbital due to concern for continued focal status epilepticus on LTM EEG. Propofol was started for sedation as well as seizure control. She was also started on pregabalin this admission. Of note, she did have one glucose reading of 744 10 days ago and when interviewed by Dr. Lorraine Lax had stated that her symptoms had been going on for roughly a week.  Objective: Current vital signs: BP 110/78   Pulse 62   Temp 97.7 F (36.5 C) (Axillary)   Resp 18   Ht '5\' 5"'  (1.651 m)   Wt 118.4 kg   LMP 11/26/2014 (Exact Date)   SpO2 100%   BMI 43.44 kg/m  Vital signs in last 24 hours: Temp:  [97.1 F (36.2 C)-98.2 F (36.8 C)] 97.7 F (36.5 C) (03/09 0800) Pulse Rate:  [62-110] 62 (03/09 0700) Resp:  [13-23] 18 (03/09 0700) BP: (40-138)/(30-121)  110/78 (03/09 0700) SpO2:  [92 %-100 %] 100 % (03/09 0700) FiO2 (%):  [30 %-100 %] 30 % (03/09 0337)  Intake/Output from previous day: 03/08 0701 - 03/09 0700 In: 2169 [I.V.:1970; IV Piggyback:200] Out: 3125 [Urine:3125] Intake/Output this shift: No intake/output data recorded. Nutritional status:  Diet Order            Diet NPO time specified Except for: Ice Chips, Sips with Meds  Diet effective now              Neurologic Exam: Ment: Intubated and sedated on propofol. No responses to any external stimuli. CN: No blink to threat. No spontaneous EOM. No doll's eye reflex (sedated). Face symmetric.  Motor/Sensory: Flaccid tone x 4 with no movement to noxious stimuli.  Reflexes: Hypoactive x 4  Lab Results: Results for orders placed or performed during the hospital encounter of 05/06/18 (from the past 48 hour(s))  Glucose, capillary     Status: Abnormal   Collection Time: 05/08/18 11:54 AM  Result Value Ref Range   Glucose-Capillary 266 (H) 70 - 99 mg/dL  Glucose, capillary     Status: Abnormal   Collection Time: 05/08/18  6:02 PM  Result Value Ref Range   Glucose-Capillary 141 (H) 70 - 99 mg/dL  Glucose, capillary     Status: Abnormal   Collection Time: 05/08/18  9:29 PM  Result Value Ref Range   Glucose-Capillary 224 (H) 70 - 99 mg/dL   Comment 1 Notify RN    Comment  2 Document in Chart   Glucose, capillary     Status: Abnormal   Collection Time: 05/09/18  7:42 AM  Result Value Ref Range   Glucose-Capillary 174 (H) 70 - 99 mg/dL   Comment 1 Notify RN    Comment 2 Document in Chart   Glucose, capillary     Status: Abnormal   Collection Time: 05/09/18 11:37 AM  Result Value Ref Range   Glucose-Capillary 146 (H) 70 - 99 mg/dL   Comment 1 Notify RN    Comment 2 Document in Chart   Phenytoin level, total     Status: None   Collection Time: 05/09/18 11:38 AM  Result Value Ref Range   Phenytoin Lvl 11.7 10.0 - 20.0 ug/mL    Comment: Performed at [facility] [address], [place], [place] [address]  MRSA PCR Screening     Status: None   Collection Time: 05/09/18 12:45 PM  Result Value Ref Range   MRSA by PCR NEGATIVE NEGATIVE    Comment:        The GeneXpert MRSA Assay (FDA approved for NASAL specimens only), is one component of a comprehensive MRSA colonization surveillance program. It is not intended to diagnose MRSA infection nor to guide or monitor treatment for MRSA infections. Performed at [facility], [place] [address], [place], [place] [address]   I-STAT 7, (LYTES, BLD GAS, ICA, H+H)     Status: Abnormal   Collection Time: 05/09/18  4:04 PM  Result Value Ref Range   pH, Arterial 7.351 7.350 - 7.450   pCO2 arterial 40.4 32.0 - 48.0 mmHg   pO2, Arterial 476.0 (H) 83.0 - 108.0 mmHg   Bicarbonate 22.4 20.0 - 28.0 mmol/L   TCO2 24 22 - 32 mmol/L   O2 Saturation 100.0 %   Acid-base deficit 3.0 (H) 0.0 - 2.0 mmol/L   Sodium 139 135 - 145 mmol/L   Potassium 3.6 3.5 - 5.1 mmol/L   Calcium, Ion 1.22 1.15 - 1.40 mmol/L   HCT 34.0 (L) 36.0 - 46.0 %   Hemoglobin 11.6 (L) 12.0 - 15.0 g/dL   Patient temperature HIDE    Collection site RADIAL, ALLEN'S TEST ACCEPTABLE    Drawn by RT    Sample type ARTERIAL   Glucose, capillary     Status: Abnormal   Collection Time: 05/09/18  4:08 PM  Result Value Ref Range   Glucose-Capillary 109 (H) 70 - 99 mg/dL   Comment 1 Notify RN    Comment 2 Document in Chart   Glucose, capillary     Status: Abnormal   Collection Time: 05/09/18  7:25 PM  Result Value Ref Range   Glucose-Capillary 144 (H) 70 - 99 mg/dL  Basic metabolic panel     Status: Abnormal   Collection Time: 05/09/18  8:49 PM  Result Value Ref Range   Sodium 136 135 - 145 mmol/L   Potassium 3.8 3.5 - 5.1 mmol/L   Chloride 109 98 - 111 mmol/L   CO2 19 (L) 22 - 32 mmol/L   Glucose, Bld 171 (H) 70 - 99 mg/dL   BUN 9 6 - 20 mg/dL   Creatinine, Ser 1.49 (H) 0.44 - 1.00 mg/dL   Calcium 8.9 8.9 - 10.3 mg/dL   GFR calc non Af  Amer 41 (L) >60 mL/min   GFR calc Af Amer 48 (L) >60 mL/min   Anion gap 8 5 - 15    Comment: Performed at [facility], [address] [place], [place] [address]  CBC     Status: Abnormal   Collection Time: 05/09/18  8:49 PM  Result Value Ref Range   WBC 11.6 (H) 4.0 - 10.5 K/uL   RBC 5.32 (H) 3.87 - 5.11 MIL/uL   Hemoglobin 11.2 (L) 12.0 - 15.0 g/dL   HCT 37.8 36.0 - 46.0 %   MCV 71.1 (L) 80.0 - 100.0 fL   MCH 21.1 (L) 26.0 - 34.0 pg   MCHC 29.6 (L) 30.0 - 36.0 g/dL   RDW 15.9 (H) 11.5 - 15.5 %   Platelets 371 150 - 400 K/uL   nRBC 0.0 0.0 - 0.2 %    Comment: Performed at [facility] [address] [place], [place] [address]  Magnesium     Status: None   Collection Time: 05/09/18  8:49 PM  Result Value Ref Range   Magnesium 2.0 1.7 - 2.4 mg/dL    Comment: Performed at [facility], [place] [address] [place], [place] [address]  Triglycerides     Status: Abnormal   Collection Time: 05/09/18  8:49 PM  Result Value Ref Range   Triglycerides 320 (H) <150 mg/dL    Comment: Performed at [facility] [address], [place], [place] [address]  Glucose, capillary     Status: Abnormal   Collection Time: 05/09/18 11:22 PM  Result Value Ref Range   Glucose-Capillary 159 (H) 70 - 99 mg/dL  Urinalysis, Routine w reflex microscopic     Status: None   Collection Time: 05/09/18 11:46 PM  Result Value Ref Range   Color, Urine YELLOW YELLOW   APPearance CLEAR CLEAR   Specific Gravity, Urine 1.016 1.005 - 1.030   pH 5.0 5.0 - 8.0   Glucose, UA NEGATIVE NEGATIVE mg/dL   Hgb urine dipstick NEGATIVE NEGATIVE   Bilirubin Urine NEGATIVE NEGATIVE   Ketones, ur NEGATIVE NEGATIVE mg/dL   Protein, ur NEGATIVE NEGATIVE mg/dL   Nitrite NEGATIVE NEGATIVE   Leukocytes,Ua NEGATIVE NEGATIVE    Comment: Performed at [facility] [address] [place], [place] [address]  Glucose, capillary     Status: Abnormal   Collection Time: 05/10/18  3:29 AM  Result Value Ref  Range   Glucose-Capillary 171 (H) 70 - 99 mg/dL  I-STAT 7, (LYTES, BLD GAS, ICA, H+H)     Status: Abnormal   Collection Time: 05/10/18  3:55 AM  Result Value Ref Range   pH, Arterial 7.342 (L) 7.350 - 7.450   pCO2 arterial 37.4 32.0 - 48.0 mmHg   pO2, Arterial 217.0 (H) 83.0 - 108.0 mmHg   Bicarbonate 20.3 20.0 - 28.0 mmol/L   TCO2 21 (L) 22 - 32 mmol/L   O2 Saturation 100.0 %   Acid-base deficit 5.0 (H) 0.0 - 2.0 mmol/L   Sodium 139 135 - 145 mmol/L   Potassium 3.7 3.5 - 5.1 mmol/L   Calcium, Ion 1.30 1.15 - 1.40 mmol/L   HCT 35.0 (L) 36.0 - 46.0 %   Hemoglobin 11.9 (L) 12.0 - 15.0 g/dL   Patient temperature 98.6 F    Collection site RADIAL, ALLEN'S TEST ACCEPTABLE    Drawn by RT    Sample type ARTERIAL   Basic metabolic panel     Status: Abnormal   Collection Time: 05/10/18  3:58 AM  Result Value Ref Range   Sodium 139 135 - 145 mmol/L   Potassium 3.8 3.5 - 5.1 mmol/L   Chloride 112 (  H) 98 - 111 mmol/L   CO2 20 (L) 22 - 32 mmol/L   Glucose, Bld 173 (H) 70 - 99 mg/dL   BUN 8 6 - 20 mg/dL   Creatinine, Ser 1.47 (H) 0.44 - 1.00 mg/dL   Calcium 8.1 (L) 8.9 - 10.3 mg/dL   GFR calc non Af Amer 42 (L) >60 mL/min   GFR calc Af Amer 48 (L) >60 mL/min   Anion gap 7 5 - 15    Comment: Performed at [facility] [address], [place], [place] [address]  CBC     Status: Abnormal   Collection Time: 05/10/18  3:58 AM  Result Value Ref Range   WBC 11.9 (H) 4.0 - 10.5 K/uL   RBC 5.42 (H) 3.87 - 5.11 MIL/uL   Hemoglobin 11.3 (L) 12.0 - 15.0 g/dL   HCT 39.1 36.0 - 46.0 %   MCV 72.1 (L) 80.0 - 100.0 fL   MCH 20.8 (L) 26.0 - 34.0 pg   MCHC 28.9 (L) 30.0 - 36.0 g/dL   RDW 16.2 (H) 11.5 - 15.5 %   Platelets 358 150 - 400 K/uL   nRBC 0.0 0.0 - 0.2 %    Comment: Performed at [facility] [address], [place], [place] [address]  Magnesium     Status: None   Collection Time: 05/10/18  3:58 AM  Result Value Ref Range   Magnesium 2.1 1.7 - 2.4 mg/dL    Comment: Performed  at [facility] [address] [place], [place] [address]  Glucose, capillary     Status: Abnormal   Collection Time: 05/10/18  8:18 AM  Result Value Ref Range   Glucose-Capillary 162 (H) 70 - 99 mg/dL   Comment 1 Notify RN    Comment 2 Document in Chart     Recent Results (from the past 240 hour(s))  Culture, blood (Routine X 2) w Reflex to ID Panel     Status: None (Preliminary result)   Collection Time: 05/07/18  1:40 AM  Result Value Ref Range Status   Specimen Description   Final    BLOOD RIGHT ANTECUBITAL Performed at [facility] [address], [place], [place] [address]    Special Requests   Final    BOTTLES DRAWN AEROBIC AND ANAEROBIC Blood Culture adequate volume Performed at [facility] [address], [place], [place] [address]    Culture   Final    NO GROWTH 3 DAYS Performed at [facility], [place] [address], [place], [place] [address]    Report Status PENDING  Incomplete  Culture, blood (Routine X 2) w Reflex to ID Panel     Status: None (Preliminary result)   Collection Time: 05/07/18  3:31 AM  Result Value Ref Range Status   Specimen Description   Final    BLOOD LEFT HAND Performed at [facility], [place] [address], [place], [place] [address]    Special Requests   Final    BOTTLES DRAWN AEROBIC AND ANAEROBIC Blood Culture adequate volume Performed at [facility] [address], [place], [place] [address]    Culture   Final    NO GROWTH 3 DAYS Performed at [facility], [place] [address], [place],  [address]    Report Status PENDING  Incomplete  MRSA PCR Screening     Status: None   Collection Time: 05/09/18 12:45 PM  Result Value Ref  Range Status   MRSA by PCR NEGATIVE NEGATIVE Final    Comment:        The GeneXpert MRSA Assay (FDA approved for NASAL specimens only), is one component of a comprehensive MRSA colonization surveillance program. It is not intended  to diagnose MRSA infection nor to guide or monitor treatment for MRSA infections. Performed at [facility], [place] [address], [place], [place] [address]     Lipid Panel Recent Labs    05/09/18 2049  TRIG 320*    Studies/Results: Ct Head W & Wo Contrast  Result Date: 05/08/2018 CLINICAL DATA:  Seizure, new, abnormal neuro exam. EXAM: CT HEAD WITHOUT AND WITH CONTRAST TECHNIQUE: Contiguous axial images were obtained from the base of the skull through the vertex without and with intravenous contrast CONTRAST:  40m OMNIPAQUE IOHEXOL 300 MG/ML  SOLN COMPARISON:  CTA head and neck 05/08/2018.  MRI brain 05/06/2018. FINDINGS: Brain: No acute infarct, hemorrhage, or mass lesion is present. The ventricles are of normal size. No significant extraaxial fluid collection is present. The brainstem and cerebellum are within normal limits. Postcontrast images demonstrate no pathologic enhancement. Vascular: No hyperdense vessel or unexpected calcification. Visible vessels are patent. Skull: Calvarium is intact. No focal lytic or blastic lesions are present. Sinuses/Orbits: The paranasal sinuses and mastoid air cells are clear. The globes and orbits are within normal limits. IMPRESSION: 1. Normal CT of the head without and with contrast. Electronically Signed   By: CSan MorelleM.D.   On: 05/08/2018 19:41   Dg Chest Port 1 View  Result Date: 05/09/2018 CLINICAL DATA:  Central line placement EXAM: PORTABLE CHEST 1 VIEW COMPARISON:  Chest radiograph from earlier today. FINDINGS: Endotracheal tube tip is 2.2 cm above the carina. Enteric tube terminates in the gastric fundus. Right internal jugular central venous catheter terminates in the lower third of the SVC. Stable cardiomediastinal silhouette with top-normal heart size. No pneumothorax. No pleural effusion. Low lung volumes with curvilinear bibasilar lung opacities. No pulmonary edema. IMPRESSION: 1. Well-positioned lines and tubes. No  pneumothorax. 2. Low lung volumes with curvilinear bibasilar lung opacities, favor atelectasis. Electronically Signed   By: JIlona SorrelM.D.   On: 05/09/2018 19:54   Dg Chest Port 1 View  Result Date: 05/09/2018 CLINICAL DATA:  Intubation. EXAM: PORTABLE CHEST 1 VIEW COMPARISON:  05/06/2018 FINDINGS: Endotracheal tube terminates 2.7 cm above carina. Nasogastric tube terminates at the body of the stomach. Extremely low lung volumes. Normal heart size for level of inspiration. No pneumothorax. Bibasilar pulmonary opacities are increased on the left and new on the right. There may be minimal fluid in the right minor fissure. IMPRESSION: Appropriate position of endotracheal tube. Diminished lung volumes with new and increased bibasilar airspace disease, favored to represent atelectasis. Suspect minimal fluid tracking within the right minor fissure. Electronically Signed   By: KAbigail MiyamotoM.D.   On: 05/09/2018 15:45   UKoreaEkg Site Rite  Result Date: 05/09/2018 If Site Rite image not attached, placement could not be confirmed due to current cardiac rhythm.   Medications:  Scheduled: . chlorhexidine gluconate (MEDLINE KIT)  15 mL Mouth Rinse BID  . enoxaparin (LOVENOX) injection  60 mg Subcutaneous Daily  . fluticasone  2 spray Each Nare Daily  . insulin aspart  0-9 Units Subcutaneous Q4H  . insulin glargine  10 Units Subcutaneous Daily  . mouth rinse  15 mL Mouth Rinse 10 times per day  . phenytoin (DILANTIN) IV  100 mg Intravenous Q8H  .  pregabalin  200 mg Per Tube TID   Continuous: . sodium chloride 75 mL/hr at 05/10/18 0700  . sodium chloride    . fentaNYL infusion INTRAVENOUS Stopped (05/09/18 1853)  . levETIRAcetam 1,500 mg (05/10/18 0835)  . norepinephrine (LEVOPHED) Adult infusion 7 mcg/min (05/10/18 0700)  . propofol (DIPRIVAN) infusion 60 mcg/kg/min (05/10/18 0700)    Assessment/Recommendations: 47 year old female with refractory focal status epilepticus and Todd's paresis. MRI  negative for restricted diffusion.  1. Etiology: Most likely precipitated by severe hyperglycemia prior to admission. Although no acute abnormality on MRI, there is subtle T2 hypodensity noted in the right occipital lobe, which would be consistent with seizures precipitated by severe hyperglycemia.  2. Was loaded with 500 mg phenobarbital yesterday, which resulted in sedation and intubation requirement. Will need to continue at a scheduled dose of 65 mg qhs, starting tonight.  3. Also on Keppra 1500 mg BID, Dilantin 100 mg TID, Lyrica 200 mg TID 4. Continue to avoid Vimpat due to questionable heart block noted on telemetry 5. On LTM EEG. Continue long-term EEG, appreciate epileptologist assistance in reviewing EEG and case discussed with him 6. Control hyperglycemia  7. Repeat MRI brain with contrast after EEG leads removed  8. Was started on propofol at 1851 yesterday, after seizures continued despite phenobarbital load. LTM EEG currently with suppression of seizures. Given cessation of seizures, will begin tapering off propofol by 10 mcg/kg/min every hour. Will closely monitor clinically and on EEG. 9. LTM EEG report from this morning: Abnormal continuous EEG due tofocal non-convulsive status epilepticus arising from the bilateral right maximal posterior temporal region. There was resolution of seizure activity after 1900 with emergence of a partial burst-suppression pattern thereafter  60 minutes spent in the neurological evaluation and management of this critically patient.    LOS: 3 days   '@Electronically'  signed: Dr. Kerney Elbe 05/10/2018  8:37 AM

## 2018-05-10 NOTE — Progress Notes (Addendum)
48yo female admitted for status epilepticus requiring intubation and sedation initially. Patient evaluated after weaning off of propofol drip about 3 hours ago. She had dyssynchrony with the vent and required starting fentanyl drip.  On evaluation she is opens eyes to voice and wiggles bil toes and fingers on command; PERRL, spontaneous eye movement intact but unable to further evaluate. No jerking, twitching, eye deviation or other seizure like activity noted.   Bedside review of EEG shows no electrographic seizure. There is diffuse slowing and frequent eye-blink artifact.   Recommendations: Continue Keppra, dilantin and lyrica at current doses. Can continue use of fentanyl for light sedation. Will re-evaluate in the morning and review video EEG for further evidence of seizure activity.  Alphonzo Grieve, MD PGY3

## 2018-05-10 NOTE — Progress Notes (Signed)
Verbal order given to this RN by MD Kerney Elbe with neurology to begin tapering off propofol 10 mcg/kg/min every hour until completely off.

## 2018-05-10 NOTE — Progress Notes (Addendum)
Informed MD Kerney Elbe with neurology that pt is off propofol. Pt is more awake, MD will come see pt. Pt increasingly agitated and asynchronous with ventilator. Spoke with MD Edgardo Roys order given to re-start fentanyl gtt and add 73mcg bolus every 1 hour PRN.

## 2018-05-10 NOTE — Procedures (Signed)
LTM-EEG Report  HISTORY: Continuous video-EEG monitoring performed for67year old with non-convulsive seizures. ACQUISITION: International 10-20 system for electrode placement; 18 channels with additional eyes linked to ipsilateral ears and EKG. Additional T1-T2 electrodes were used. Continuous video recording obtained.   EEG NUMBER:  MEDICATIONS:  Day2:see EMR  DAY #2:from0730 05/09/18 to 0730 05/10/18 BACKGROUND:This was initially a medium voltage, continuous recording with good spontaneous variability and reactivity. The background consisted of a medium voltage 9-10Hz  posterior dominant rhythm with some intermixed theta and beta activity with state changes. There was attenuation of the background after 1900 with increased sedation. A partial burst-suppression pattern emerged thereafter with periods of attenuation lasting 1-5 seconds and bursts of theta-alpha activity lasting 2-5 seconds.   EPILEPTIFORM/PERIODIC ACTIVITY:no clear interictal discharges SEIZURES: There were initially frequent, recurrent subclinical seizures arising from the right or posterior posterior temporal regions with onset of rapid spikes, buildup in frequency, spread through the right hemisphere, then slowing over 30-60 seconds duration. These were unchanged with IV AED loading in the morning, eventually resolving at 1900 with intubation and continuous sedation.  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 There was resolution of seizure activity after 1900 with emergence of a partial burst-suppression pattern thereafter.

## 2018-05-10 NOTE — Progress Notes (Signed)
TRH signoff note  Patient decompensated on 3/8.  She was transferred to the intensive care unit due to status epilepticus & subsequently intubated for airway protection.  Neurology continues to consult.    I communicated with PCCM today and TRH will sign off at this time.  Please reconsult TRH for any further assistance.  Vernell Leep, MD, FACP, [facility]. [facility]  To contact the attending provider between 7A-7P or the covering provider during after hours 7P-7A, please log into the web site [email or web address] and access using universal [place] password for that web site. If you do not have the password, please call the hospital operator.

## 2018-05-10 NOTE — Progress Notes (Signed)
During shift change report noticed pt had a brief episode of bradycardia, as low as 36 and lasting maybe 1-2 min being that this was a witnessed episode. Immediately stimulated pt with hr rising into the 90's and sinus rhythm. Pt able to follow commands. Will notify Dr. Carloyn Jaeger who is on the floor at this time seeing another pt. Will continue to monitor pt closely throughout the night.

## 2018-05-10 NOTE — Progress Notes (Signed)
Propofol rate increased to 60 per verbal order form MD Leonel Ramsay for seizure suppression. Patient vital signs stable. RN will continue to monitor.

## 2018-05-10 NOTE — Progress Notes (Addendum)
Initial Nutrition Assessment  DOCUMENTATION CODES:   Morbid obesity  INTERVENTION:   Tube feeding:  -Vital High Protein @ 60 ml/hr via OGT (1440 ml) -Free water flushes 200 ml Q8 hours per CCM  Provides: 1440 kcals, 126 grams protein, 1204 ml free water (1804 ml with flushes). Meets 100% of needs.   NUTRITION DIAGNOSIS:   Inadequate oral intake related to inability to eat as evidenced by NPO status.  GOAL:   Provide needs based on ASPEN/SCCM guidelines  MONITOR:   Diet advancement, Vent status, TF tolerance, Weight trends, Labs, I & O's, Skin  REASON FOR ASSESSMENT:   Consult Enteral/tube feeding initiation and management  ASSESSMENT:   Patient with PMH significant for DM, asthma, CKD III, and GERD. Admitted to [facility] for LTM monitoring. Transferred to [facility] 3/8 for EEG showing multiple right temporal seizures.    Gastric Cortrak placed this am. Vital High Protein started at 40 ml/hr + 30 ml Prostat BID. Per RN, propofol titrated off this afternoon. RD to adjust TF to better meet pt's needs.   No family at bedside to provide history. Pt looked to have good appetite prior to intubation with meal completions charted as 75-100%. Records indicate pt maintained her weight of 260 lb- 270 lb over the last year. Will utilize EDW of 118.4 kg to estimate needs.   Patient is currently intubated on ventilator support MV: 8.9 L/min Temp (24hrs), Avg:97.7 F (36.5 C), Min:97.1 F (36.2 C), Max:98.1 F (36.7 C) BP: 126/97 MAP: 105  I/O: +543 ml since admit UOP: 3125 ml x 24 hrs   Medications reviewed and include: SS novolog, Lantus, levophed Labs reviewed: CBGs 144-180 TGs 320 (H) A1C >14 (H)  NUTRITION - FOCUSED PHYSICAL EXAM:    Most Recent Value  Orbital Region  No depletion  Upper Arm Region  No depletion  Thoracic and Lumbar Region  Unable to assess  Buccal Region  Unable to assess  Temple Region  No depletion  Clavicle Bone Region  No depletion  Clavicle and Acromion  Bone Region  No depletion  Scapular Bone Region  Unable to assess  Dorsal Hand  No depletion  Patellar Region  No depletion  Anterior Thigh Region  No depletion  Posterior Calf Region  No depletion  Edema (RD Assessment)  Mild  Hair  Reviewed  Eyes  Unable to assess  Mouth  Unable to assess  Skin  Reviewed  Nails  Reviewed     Diet Order:   Diet Order            Diet NPO time specified Except for: Ice Chips, Sips with Meds  Diet effective now              EDUCATION NEEDS:   Not appropriate for education at this time  Skin:  Skin Assessment: Skin Integrity Issues: Skin Integrity Issues:: Other (Comment) Other: MASD- breast/groin  Last BM:  3/4  Height:   Ht Readings from Last 1 Encounters:  05/07/18 5\' 5"  (1.651 m)    Weight:   Wt Readings from Last 1 Encounters:  05/07/18 118.4 kg    Ideal Body Weight:  56.8 kg  BMI:  Body mass index is 43.44 kg/m.  Estimated Nutritional Needs:   Kcal:  6333-5456 kcal  Protein:  114-142 grams  Fluid:  >/= 1.5 L/day   Mariana Single RD, LDN Clinical Nutrition Pager # - [phone]

## 2018-05-11 DIAGNOSIS — J9601 Acute respiratory failure with hypoxia: Secondary | ICD-10-CM

## 2018-05-11 LAB — BASIC METABOLIC PANEL
Anion gap: 8 (ref 5–15)
BUN: 10 mg/dL (ref 6–20)
CO2: 21 mmol/L — ABNORMAL LOW (ref 22–32)
CREATININE: 1.64 mg/dL — AB (ref 0.44–1.00)
Calcium: 8.7 mg/dL — ABNORMAL LOW (ref 8.9–10.3)
Chloride: 108 mmol/L (ref 98–111)
GFR calc Af Amer: 42 mL/min — ABNORMAL LOW (ref >60)
GFR calc non Af Amer: 37 mL/min — ABNORMAL LOW (ref >60)
Glucose, Bld: 175 mg/dL — ABNORMAL HIGH (ref 70–99)
POTASSIUM: 4 mmol/L (ref 3.5–5.1)
Sodium: 137 mmol/L (ref 135–145)

## 2018-05-11 LAB — CBC
HEMATOCRIT: 34.8 % — AB (ref 36.0–46.0)
HEMOGLOBIN: 10.3 g/dL — AB (ref 12.0–15.0)
MCH: 21.5 pg — ABNORMAL LOW (ref 26.0–34.0)
MCHC: 29.6 g/dL — ABNORMAL LOW (ref 30.0–36.0)
MCV: 72.5 fL — ABNORMAL LOW (ref 80.0–100.0)
Platelets: 236 10*3/uL (ref 150–400)
RBC: 4.8 MIL/uL (ref 3.87–5.11)
RDW: 16.6 % — AB (ref 11.5–15.5)
WBC: 9.2 10*3/uL (ref 4.0–10.5)
nRBC: 0 % (ref 0.0–0.2)

## 2018-05-11 LAB — GLUCOSE, CAPILLARY
GLUCOSE-CAPILLARY: 96 mg/dL (ref 70–99)
Glucose-Capillary: 126 mg/dL — ABNORMAL HIGH (ref 70–99)
Glucose-Capillary: 156 mg/dL — ABNORMAL HIGH (ref 70–99)
Glucose-Capillary: 126 mg/dL — ABNORMAL HIGH (ref 70–99)
Glucose-Capillary: 93 mg/dL (ref 70–99)

## 2018-05-11 LAB — PHOSPHORUS: Phosphorus: 4.5 mg/dL (ref 2.5–4.6)

## 2018-05-11 LAB — MAGNESIUM: Magnesium: 2.1 mg/dL (ref 1.7–2.4)

## 2018-05-11 MED ORDER — ACETAMINOPHEN 325 MG PO TABS
650.0000 mg | ORAL_TABLET | Freq: Four times a day (QID) | ORAL | Status: DC | PRN
  Administered 2018-05-11 – 2018-05-13 (×3): 650 mg
  Filled 2018-05-11 (×3): qty 2

## 2018-05-11 NOTE — Progress Notes (Signed)
Pt extubated at 1150. Pt following commands, in no distress at this time.

## 2018-05-11 NOTE — Progress Notes (Signed)
Wasted 150 mL of fentanyl from bag into sink with Geralynn Rile, Therapist, sports.

## 2018-05-11 NOTE — Procedures (Signed)
CPT/Type of Study: 68115; 24hr EEG with video Recording date: 05/10/2018 07:30 - 05/11/2018 07:30  Interpreting Physician: Izora Ribas, DO  History: This is a 48 year old patient, undergoing an EEG to evaluate for seizures. Presented with refractory focal status epilepticusin the setting of severe hyperglycemia.   Technical Description: The EEG was performed using standard setting per the guidelines of American Clinical Neurophysiology Society (ACNS).   A minimum of 21 electrodes were placed on scalp according to the International 10-20 or/and 10-10 Systems. Supplemental electrodes were placed as needed. Single EKG electrode was also used to detect cardiac arrhythmia. Patient's behavior was continuously recorded on video simultaneously with EEG. A minimum of 16 channels were used for data display. Each epoch of study was reviewed manually daily and as needed using standard referential and bipolar montages. Computerized quantitative EEG analysis (such as compressed spectral array analysis, trending, automated spike & seizure detection) were used as indicated.   Clinical State: Lethargy  Background: The posterior dominant rhythm was recorded at a rate of 7 Hz, symmetric, waxing and waning, and reduced by eye opening Predominant frequency: Theta and delta rhythms. In the initial portion of the recording there were periods of suppression lasting 2-4 seconds.  Overall Amplitude: Normal Asymmetry: No Sleep background: Stage II  Abnormalities: Continuous slow generalized  Rhythmic or periodic pattern: Intermittent rhythmic delta  Epileptiform activity: No Electrographic Seizure: No Events: No  Breach rhythm: No Reactivity: Yes  Stimulation procedures:  Hyperventilation: Not done Photic stimulation: Not done  Impression: This EEG shows evidence of an improving diffuse encephalopathy. No epileptiform discharges or EEG seizures were recorded.

## 2018-05-11 NOTE — Progress Notes (Signed)
Spoke with Mae,RN and she is aware of the discontinue central line order and will remove central line.   Delilah Shan Delailah Spieth,RN-VAST

## 2018-05-11 NOTE — Progress Notes (Signed)
PT Cancellation Note  Patient Details Name: Rebecca Johnston MRN: [id number] DOB: 1971-02-08   Cancelled Treatment:    Reason Eval/Treat Not Completed: Patient not medically ready. Pt in the process of weaning with plan to extubate today. PT to return as able to complete PT eval.  Kittie Plater, PT, DPT Acute Rehabilitation Services Pager #: [phone] Office #: [phone]    Berline Lopes 05/11/2018, 12:25 PM

## 2018-05-11 NOTE — Progress Notes (Signed)
vLTM EEG complete. No skin breakdown 

## 2018-05-11 NOTE — Plan of Care (Signed)
  Problem: Clinical Measurements: Goal: Ability to maintain clinical measurements within normal limits will improve Outcome: Progressing Goal: Will remain free from infection Outcome: Progressing Goal: Diagnostic test results will improve Outcome: Progressing Goal: Respiratory complications will improve Outcome: Progressing Goal: Cardiovascular complication will be avoided Outcome: Progressing   Problem: Coping: Goal: Level of anxiety will decrease Outcome: Progressing   Problem: Elimination: Goal: Will not experience complications related to urinary retention Outcome: Progressing   Problem: Pain Managment: Goal: General experience of comfort will improve Outcome: Progressing   Problem: Safety: Goal: Ability to remain free from injury will improve Outcome: Progressing   Problem: Skin Integrity: Goal: Risk for impaired skin integrity will decrease Outcome: Progressing   

## 2018-05-11 NOTE — Progress Notes (Addendum)
NAME:  Rebecca Johnston, MRN:  [id number], DOB:  16-Jun-1970, LOS: 4 ADMISSION DATE:  05/06/2018, CONSULTATION DATE: 05/09/2018 REFERRING MD:  Vernell Leep, MD CHIEF COMPLAINT: Status epilepticus  Brief History   48 year old female transferred from [facility] for LTM monitoring.  Transferred to ICU for EEG demonstrating multiple right temporal seizures.  PCCM consulted.   Past Medical History  CKD stage III, diabetes type 2, asthma, morbid obesity  Significant Hospital Events   3/5 admitted 3/8 Transfer to ICU; Reports headaches improved. However feels drowsy. Family at bedside including mother and aunt reports episodes of limb jerking <12min followed by sleepiness. Was intubated for airway protection. Required nor-epi for hypotension felt d/t propofol and phenobarb load.  3/9 titrating propofol down watching LTM for seizure activity.  3/10: fully awake. Ready for extubation  Consults:  Neuro  Procedures:  EEG 3/6 right temporal complex partial seizure  Significant Diagnostic Tests:  MR brain 3/5-no stroke, hemorrhage or mass seen CT head/CTA head and neck 3/6-read as normal CT head 3/7-no acute intracranial abnormalities  Micro Data:  BCX 3/6 >  Antimicrobials:    Interim history/subjective:  Awake and following commands  Objective   Blood pressure 117/76, pulse 97, temperature 99.8 F (37.7 C), temperature source Oral, resp. rate 19, height 5\' 5"  (1.651 m), weight 118.4 kg, last menstrual period 11/26/2014, SpO2 100 %.    Vent Mode: PSV;CPAP FiO2 (%):  [30 %] 30 % Set Rate:  [18 bmp] 18 bmp Vt Set:  [450 mL] 450 mL PEEP:  [5 cmH20] 5 cmH20 Pressure Support:  [12 cmH20] 12 cmH20 Plateau Pressure:  [15 cmH20-19 cmH20] 15 cmH20   Intake/Output Summary (Last 24 hours) at 05/11/2018 1003 Last data filed at 05/11/2018 0900 Gross per 24 hour  Intake 709.47 ml  Output 1350 ml  Net -640.53 ml   Filed Weights   05/07/18 0307  Weight: 118.4 kg   Physical Exam:  General:  48 year old female now fully awake and following commands 'HEENT normocephalic atraumatic.  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 Not actively seizing now but not responsive at all. Worried about possible anoxic injury vs just residual sedation.  Plan Cont AEDs per neuro: phenobarb, keppra, dilantin and lyrica.  Supportive care  Acute respiratory failure ->mental status major barrier to weaning ->passed SBT Plan Extubate Asp precaution Wean oxygen  Pulse ox   Drug induced hypotension Plan Titrate norepi as needed  CKD stage III; looks like baseline ~ 1.6 Plan Trend chem Avoid hypotension Am chem  Fluid and electrolyte imbalance: hyperchloremia and mild NAG metabolic acidosis  Plan Keeping IVFs at [place] lytes as indicated   Diabetes mellitus type 2 w/ hyperglycemia  HG A1c >14 Plan  ssi   H/o GERD Plan PPI   Best practice:  Diet: NPO Pain/Anxiety/Delirium protocol (if indicated): None VAP protocol (if indicated): No DVT prophylaxis: Lovenox GI prophylaxis: Pepcid Glucose control: As noted above Mobility: BR Code Status: Full. OK with intubation Family Communication: Patient and mother at bedside. Disposition: she remains critically ill but has passed SBT so will  require close obs for extubation. Will cont current AEDS  Erick Colace ACNP-BC [place] Pager # [phone] OR # [phone] if no answer  Critical care attending attestation note:    Kipp Brood, MD Staff Critical Care  Physician [facility] Pager: [phone] Off hours: [phone]  05/11/2018, 10:03 AM

## 2018-05-11 NOTE — Progress Notes (Signed)
Subjective: Brief review of presentation: 48 year old female initially presenting to [facility] with headache, photophobia, blurred vision and episodes of stereotyped head turning initially thought to be a dystonic reaction to medications. Home meds included Neurontin. She stated she had been having these head turning episodes for over a week. She has no history of seizures. For her headache, she was started on DHE. EEG was obtained to evaluate for possible partial seizure as the etiology for her head turning spells, but was inconclusive. She was loaded with Kepra on 3/7. Also received a loading dose of Vimpat on 3/7 but this was not continued due to questionable heart block on telemetry. MRI brain showed no acute abnormality. 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 She was loaded with fosphenytoin and transferred to the ICU for closer monitoring. Keppra was continuted at 1000 mg BID. DHE was discontinued as headache was felt most likely to be a postictal phenomenon. On 3/8 she was transferred to the ICU for close monitoring of respiratory status. She was loaded with phenobarbital due to concern for continued focal status epilepticus on LTM EEG. Propofol was started for sedation as well as seizure control. She was also started on pregabalin this admission. Of note, she did have one glucose reading of 744 10 days ago and when interviewed by Dr. Lorraine Lax had stated that her symptoms had been going on for roughly a week.  Objective: Current vital signs: BP 117/76   Pulse 97   Temp 99.8 F (37.7 C) (Oral)   Resp 19   Ht _0  (1.651 m)   Wt 118.4 kg   LMP 11/26/2014 (Exact Date)   SpO2 100%   BMI 43.44 kg/m  Vital signs in last 24 hours: Temp:  [97.8 F (36.6 C)-100.5 F (38.1 C)] 99.8 F (37.7 C) (03/10 0800) Pulse Rate:  [65-109] 97 (03/10 0900) Resp:  [13-25] 19 (03/10 0900) BP: (93-142)/(61-101) 117/76  (03/10 0900) SpO2:  [98 %-100 %] 100 % (03/10 0900) FiO2 (%):  [30 %] 30 % (03/10 0756)  Intake/Output from previous day: 03/09 0701 - 03/10 0700 In: 1025.1 [I.V.:545.1; NG/GT:280; IV Piggyback:200] Out: 9741 [Urine:1650; Emesis/NG output:160] Intake/Output this shift: Total I/O In: 145 [I.V.:5; NG/GT:40; IV Piggyback:100] Out: 60 [Urine:60] Nutritional status:  Diet Order            Diet NPO time specified Except for: Ice Chips, Sips with Meds  Diet effective now              Neurologic Exam: Ment: Intubated and off sedation. Opens eyes and fixates on examiner. Will follow simple commands after a significant delay. No neglect.  CN: Will initiate saccades to left and right when requested by examiner, but has difficulty with tracking. No optic ataxia with FNF. Face symmetric.  Motor/Sensory: Will elevate BUE antigravity, grip examiner's hands equally and resist with BUE flexion at elbows. Will elevate BLE and resist examiner. No asymmetry. Moves correct limb when asked to move the extremity that is touched.   Reflexes: 2+ bilateral upper extremities. 1+ patellae.  Cerebellar: No ataxia with glove-chest-glove bilaterally  Lab Results: Results for orders placed or performed during the hospital encounter of 05/06/18 (from the past 48 hour(s))  Glucose, capillary     Status: Abnormal   Collection Time: 05/09/18 11:37 AM  Result Value Ref Range   Glucose-Capillary 146 (H) 70 - 99 mg/dL   Comment 1 Notify RN  Comment 2 Document in Chart   Phenytoin level, total     Status: None   Collection Time: 05/09/18 11:38 AM  Result Value Ref Range   Phenytoin Lvl 11.7 10.0 - 20.0 ug/mL    Comment: Performed at [facility] [address], [place], [place] [address]  MRSA PCR Screening     Status: None   Collection Time: 05/09/18 12:45 PM  Result Value Ref Range   MRSA by PCR NEGATIVE NEGATIVE    Comment:        The GeneXpert MRSA Assay (FDA approved for NASAL specimens only),  is one component of a comprehensive MRSA colonization surveillance program. It is not intended to diagnose MRSA infection nor to guide or monitor treatment for MRSA infections. Performed at [facility], [place] [address] [place], [place] [address]   I-STAT 7, (LYTES, BLD GAS, ICA, H+H)     Status: Abnormal   Collection Time: 05/09/18  4:04 PM  Result Value Ref Range   pH, Arterial 7.351 7.350 - 7.450   pCO2 arterial 40.4 32.0 - 48.0 mmHg   pO2, Arterial 476.0 (H) 83.0 - 108.0 mmHg   Bicarbonate 22.4 20.0 - 28.0 mmol/L   TCO2 24 22 - 32 mmol/L   O2 Saturation 100.0 %   Acid-base deficit 3.0 (H) 0.0 - 2.0 mmol/L   Sodium 139 135 - 145 mmol/L   Potassium 3.6 3.5 - 5.1 mmol/L   Calcium, Ion 1.22 1.15 - 1.40 mmol/L   HCT 34.0 (L) 36.0 - 46.0 %   Hemoglobin 11.6 (L) 12.0 - 15.0 g/dL   Patient temperature HIDE    Collection site RADIAL, ALLEN'S TEST ACCEPTABLE    Drawn by RT    Sample type ARTERIAL   Glucose, capillary     Status: Abnormal   Collection Time: 05/09/18  4:08 PM  Result Value Ref Range   Glucose-Capillary 109 (H) 70 - 99 mg/dL   Comment 1 Notify RN    Comment 2 Document in Chart   Glucose, capillary     Status: Abnormal   Collection Time: 05/09/18  7:25 PM  Result Value Ref Range   Glucose-Capillary 144 (H) 70 - 99 mg/dL  Basic metabolic panel     Status: Abnormal   Collection Time: 05/09/18  8:49 PM  Result Value Ref Range   Sodium 136 135 - 145 mmol/L   Potassium 3.8 3.5 - 5.1 mmol/L   Chloride 109 98 - 111 mmol/L   CO2 19 (L) 22 - 32 mmol/L   Glucose, Bld 171 (H) 70 - 99 mg/dL   BUN 9 6 - 20 mg/dL   Creatinine, Ser 1.49 (H) 0.44 - 1.00 mg/dL   Calcium 8.9 8.9 - 10.3 mg/dL   GFR calc non Af Amer 41 (L) >60 mL/min   GFR calc Af Amer 48 (L) >60 mL/min   Anion gap 8 5 - 15    Comment: Performed at [facility], [place] [address] [place],  [address]  CBC     Status: Abnormal   Collection Time: 05/09/18  8:49 PM  Result Value Ref Range   WBC  11.6 (H) 4.0 - 10.5 K/uL   RBC 5.32 (H) 3.87 - 5.11 MIL/uL   Hemoglobin 11.2 (L) 12.0 - 15.0 g/dL   HCT 37.8 36.0 - 46.0 %   MCV 71.1 (L) 80.0 - 100.0 fL   MCH 21.1 (L) 26.0 - 34.0 pg   MCHC 29.6 (L) 30.0 - 36.0  g/dL   RDW 15.9 (H) 11.5 - 15.5 %   Platelets 371 150 - 400 K/uL   nRBC 0.0 0.0 - 0.2 %    Comment: Performed at [facility], [place] [address] [place], [place] [address]  Magnesium     Status: None   Collection Time: 05/09/18  8:49 PM  Result Value Ref Range   Magnesium 2.0 1.7 - 2.4 mg/dL    Comment: Performed at [facility], [place] [address], [place], [place] [address]  Triglycerides     Status: Abnormal   Collection Time: 05/09/18  8:49 PM  Result Value Ref Range   Triglycerides 320 (H) <150 mg/dL    Comment: Performed at [facility] [address], [place], [place] [address]  Glucose, capillary     Status: Abnormal   Collection Time: 05/09/18 11:22 PM  Result Value Ref Range   Glucose-Capillary 159 (H) 70 - 99 mg/dL  Urinalysis, Routine w reflex microscopic     Status: None   Collection Time: 05/09/18 11:46 PM  Result Value Ref Range   Color, Urine YELLOW YELLOW   APPearance CLEAR CLEAR   Specific Gravity, Urine 1.016 1.005 - 1.030   pH 5.0 5.0 - 8.0   Glucose, UA NEGATIVE NEGATIVE mg/dL   Hgb urine dipstick NEGATIVE NEGATIVE   Bilirubin Urine NEGATIVE NEGATIVE   Ketones, ur NEGATIVE NEGATIVE mg/dL   Protein, ur NEGATIVE NEGATIVE mg/dL   Nitrite NEGATIVE NEGATIVE   Leukocytes,Ua NEGATIVE NEGATIVE    Comment: Performed at [facility] [address], [place], [place] [address]  Glucose, capillary     Status: Abnormal   Collection Time: 05/10/18  3:29 AM  Result Value Ref Range   Glucose-Capillary 171 (H) 70 - 99 mg/dL  I-STAT 7, (LYTES, BLD GAS, ICA, H+H)     Status: Abnormal   Collection Time: 05/10/18  3:55 AM  Result Value Ref Range   pH, Arterial 7.342 (L) 7.350 - 7.450   pCO2 arterial 37.4 32.0 - 48.0 mmHg   pO2, Arterial  217.0 (H) 83.0 - 108.0 mmHg   Bicarbonate 20.3 20.0 - 28.0 mmol/L   TCO2 21 (L) 22 - 32 mmol/L   O2 Saturation 100.0 %   Acid-base deficit 5.0 (H) 0.0 - 2.0 mmol/L   Sodium 139 135 - 145 mmol/L   Potassium 3.7 3.5 - 5.1 mmol/L   Calcium, Ion 1.30 1.15 - 1.40 mmol/L   HCT 35.0 (L) 36.0 - 46.0 %   Hemoglobin 11.9 (L) 12.0 - 15.0 g/dL   Patient temperature 98.6 F    Collection site RADIAL, ALLEN'S TEST ACCEPTABLE    Drawn by RT    Sample type ARTERIAL   Basic metabolic panel     Status: Abnormal   Collection Time: 05/10/18  3:58 AM  Result Value Ref Range   Sodium 139 135 - 145 mmol/L   Potassium 3.8 3.5 - 5.1 mmol/L   Chloride 112 (H) 98 - 111 mmol/L   CO2 20 (L) 22 - 32 mmol/L   Glucose, Bld 173 (H) 70 - 99 mg/dL   BUN 8 6 - 20 mg/dL   Creatinine, Ser 1.47 (H) 0.44 - 1.00 mg/dL   Calcium 8.1 (L) 8.9 - 10.3 mg/dL   GFR calc non Af Amer 42 (L) >60 mL/min   GFR calc Af Amer 48 (L) >60 mL/min   Anion gap 7 5 - 15    Comment: Performed at [facility],  [address], [place], [place] [address]  CBC     Status: Abnormal   Collection Time: 05/10/18  3:58 AM  Result Value Ref Range   WBC 11.9 (H) 4.0 - 10.5 K/uL   RBC 5.42 (H) 3.87 - 5.11 MIL/uL   Hemoglobin 11.3 (L) 12.0 - 15.0 g/dL   HCT 39.1 36.0 - 46.0 %   MCV 72.1 (L) 80.0 - 100.0 fL   MCH 20.8 (L) 26.0 - 34.0 pg   MCHC 28.9 (L) 30.0 - 36.0 g/dL   RDW 16.2 (H) 11.5 - 15.5 %   Platelets 358 150 - 400 K/uL   nRBC 0.0 0.0 - 0.2 %    Comment: Performed at [facility] [address], [place], [place] [address]  Magnesium     Status: None   Collection Time: 05/10/18  3:58 AM  Result Value Ref Range   Magnesium 2.1 1.7 - 2.4 mg/dL    Comment: Performed at [facility] [address] [place] [address] [place], [place] [address]  Glucose, capillary     Status: Abnormal   Collection Time: 05/10/18  8:18 AM  Result Value Ref Range   Glucose-Capillary 162 (H) 70 - 99 mg/dL   Comment 1 Notify RN    Comment 2 Document in Chart    Glucose, capillary     Status: Abnormal   Collection Time: 05/10/18 11:54 AM  Result Value Ref Range   Glucose-Capillary 180 (H) 70 - 99 mg/dL   Comment 1 Notify RN    Comment 2 Document in Chart   Magnesium     Status: None   Collection Time: 05/10/18 12:40 PM  Result Value Ref Range   Magnesium 2.2 1.7 - 2.4 mg/dL    Comment: Performed at [facility], [place] [address] [place], [place] [address]  Phosphorus     Status: None   Collection Time: 05/10/18 12:40 PM  Result Value Ref Range   Phosphorus 3.9 2.5 - 4.6 mg/dL    Comment: Performed at [facility] [address] [place], [place] [address]  Glucose, capillary     Status: Abnormal   Collection Time: 05/10/18  3:40 PM  Result Value Ref Range   Glucose-Capillary 147 (H) 70 - 99 mg/dL   Comment 1 Notify RN    Comment 2 Document in Chart   Magnesium     Status: None   Collection Time: 05/10/18  5:14 PM  Result Value Ref Range   Magnesium 2.3 1.7 - 2.4 mg/dL    Comment: Performed at [facility], [place] [address], [place], [place] [address]  Phosphorus     Status: None   Collection Time: 05/10/18  5:14 PM  Result Value Ref Range   Phosphorus 4.0 2.5 - 4.6 mg/dL    Comment: Performed at [facility] [address], [place], [place] [address]  Glucose, capillary     Status: Abnormal   Collection Time: 05/10/18  8:08 PM  Result Value Ref Range   Glucose-Capillary 110 (H) 70 - 99 mg/dL  Glucose, capillary     Status: Abnormal   Collection Time: 05/10/18 11:48 PM  Result Value Ref Range   Glucose-Capillary 122 (H) 70 - 99 mg/dL  Glucose, capillary     Status: Abnormal   Collection Time: 05/11/18  3:58 AM  Result Value Ref Range   Glucose-Capillary 126 (H) 70 - 99 mg/dL  Basic metabolic panel     Status: Abnormal  Collection Time: 05/11/18  5:00 AM  Result Value Ref Range   Sodium 137 135 - 145 mmol/L   Potassium 4.0 3.5 - 5.1 mmol/L   Chloride 108 98 - 111 mmol/L   CO2 21 (L) 22 - 32 mmol/L    Glucose, Bld 175 (H) 70 - 99 mg/dL   BUN 10 6 - 20 mg/dL   Creatinine, Ser 1.64 (H) 0.44 - 1.00 mg/dL   Calcium 8.7 (L) 8.9 - 10.3 mg/dL   GFR calc non Af Amer 37 (L) >60 mL/min   GFR calc Af Amer 42 (L) >60 mL/min   Anion gap 8 5 - 15    Comment: Performed at [facility] [address], [place], [place] [address]  CBC     Status: Abnormal   Collection Time: 05/11/18  5:00 AM  Result Value Ref Range   WBC 9.2 4.0 - 10.5 K/uL   RBC 4.80 3.87 - 5.11 MIL/uL   Hemoglobin 10.3 (L) 12.0 - 15.0 g/dL   HCT 34.8 (L) 36.0 - 46.0 %   MCV 72.5 (L) 80.0 - 100.0 fL   MCH 21.5 (L) 26.0 - 34.0 pg   MCHC 29.6 (L) 30.0 - 36.0 g/dL   RDW 16.6 (H) 11.5 - 15.5 %   Platelets 236 150 - 400 K/uL   nRBC 0.0 0.0 - 0.2 %    Comment: Performed at [facility], [place] [address], [place], [place] [address]  Magnesium     Status: None   Collection Time: 05/11/18  5:00 AM  Result Value Ref Range   Magnesium 2.1 1.7 - 2.4 mg/dL    Comment: Performed at [facility], [place] [address] [place], [place] [address]  Phosphorus     Status: None   Collection Time: 05/11/18  5:00 AM  Result Value Ref Range   Phosphorus 4.5 2.5 - 4.6 mg/dL    Comment: Performed at [facility] [address] [place], [place] [address]  Glucose, capillary     Status: Abnormal   Collection Time: 05/11/18  8:15 AM  Result Value Ref Range   Glucose-Capillary 156 (H) 70 - 99 mg/dL   Comment 1 Notify RN    Comment 2 Document in Chart     Recent Results (from the past 240 hour(s))  Culture, blood (Routine X 2) w Reflex to ID Panel     Status: None (Preliminary result)   Collection Time: 05/07/18  1:40 AM  Result Value Ref Range Status   Specimen Description   Final    BLOOD RIGHT ANTECUBITAL Performed at [facility] [address], [place], [place] [address]    Special Requests   Final    BOTTLES DRAWN AEROBIC AND ANAEROBIC Blood Culture adequate volume Performed at [facility] [address], [place], [place] [address]    Culture   Final    NO GROWTH 4 DAYS Performed at [facility], [place] [address] [place], [place] [address]    Report Status PENDING  Incomplete  Culture, blood (Routine X 2) w Reflex to ID Panel     Status: None (Preliminary result)   Collection Time: 05/07/18  3:31 AM  Result Value Ref Range Status   Specimen Description   Final    BLOOD LEFT HAND Performed at [facility], [place] [address] [place],  [address]    Special Requests   Final    BOTTLES DRAWN  AEROBIC AND ANAEROBIC Blood Culture adequate volume Performed at [facility] [address] [place], [place] [address]    Culture   Final    NO GROWTH 4 DAYS Performed at [facility], [place] [address], [place], [place] [address]    Report Status PENDING  Incomplete  MRSA PCR Screening     Status: None   Collection Time: 05/09/18 12:45 PM  Result Value Ref Range Status   MRSA by PCR NEGATIVE NEGATIVE Final    Comment:        The GeneXpert MRSA Assay (FDA approved for NASAL specimens only), is one component of a comprehensive MRSA colonization surveillance program. It is not intended to diagnose MRSA infection nor to guide or monitor treatment for MRSA infections. Performed at [facility], [place] [address] [place], [place] [address]     Lipid Panel Recent Labs    05/09/18 2049  TRIG 320*    Studies/Results: Dg Chest Port 1 View  Result Date: 05/09/2018 CLINICAL DATA:  Central line placement EXAM: PORTABLE CHEST 1 VIEW COMPARISON:  Chest radiograph from earlier today. FINDINGS: Endotracheal tube tip is 2.2 cm above the carina. Enteric tube terminates in the gastric fundus. Right internal jugular central venous catheter terminates in the lower third of the SVC. Stable cardiomediastinal silhouette with top-normal heart size. No pneumothorax. No pleural effusion. Low lung volumes with curvilinear bibasilar lung  opacities. No pulmonary edema. IMPRESSION: 1. Well-positioned lines and tubes. No pneumothorax. 2. Low lung volumes with curvilinear bibasilar lung opacities, favor atelectasis. Electronically Signed   By: Ilona Sorrel M.D.   On: 05/09/2018 19:54   Dg Chest Port 1 View  Result Date: 05/09/2018 CLINICAL DATA:  Intubation. EXAM: PORTABLE CHEST 1 VIEW COMPARISON:  05/06/2018 FINDINGS: Endotracheal tube terminates 2.7 cm above carina. Nasogastric tube terminates at the body of the stomach. Extremely low lung volumes. Normal heart size for level of inspiration. No pneumothorax. Bibasilar pulmonary opacities are increased on the left and new on the right. There may be minimal fluid in the right minor fissure. IMPRESSION: Appropriate position of endotracheal tube. Diminished lung volumes with new and increased bibasilar airspace disease, favored to represent atelectasis. Suspect minimal fluid tracking within the right minor fissure. Electronically Signed   By: Abigail Miyamoto M.D.   On: 05/09/2018 15:45   [place] Ekg Site Rite  Result Date: 05/09/2018 If Site Rite image not attached, placement could not be confirmed due to current cardiac rhythm.   Medications:  Scheduled: . chlorhexidine gluconate (MEDLINE KIT)  15 mL Mouth Rinse BID  . enoxaparin (LOVENOX) injection  60 mg Subcutaneous Daily  . fluticasone  2 spray Each Nare Daily  . insulin aspart  0-9 Units Subcutaneous Q4H  . insulin glargine  10 Units Subcutaneous Daily  . mouth rinse  15 mL Mouth Rinse 10 times per day  . pantoprazole sodium  40 mg Per Tube Q1200  . PHENObarbital  65 mg Intravenous QHS  . phenytoin (DILANTIN) IV  100 mg Intravenous Q8H  . pregabalin  200 mg Per Tube TID   Continuous: . sodium chloride    . 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 No prior history of seizures. Most likely precipitated by severe  hyperglycemia. Exam improved today after tapering off of propofol yesterday. 1. Etiology:Most likely precipitated by severe hyperglycemia prior to admission. Although no acute abnormality on MRI, there  is subtle T2 hypodensity noted in the right occipital lobe, which would be consistent with seizures precipitated by severe hyperglycemia.  2. Sedating effects of initial phenobarbital load (loaded on Sunday) are resolving. Continue at scheduled dose of 65 mg qhs, starting tonight. Also on Keppra 1500 mg BID, Dilantin 100 mg TID, Lyrica 200 mg TID. Once extubated, will attempt to taper off these medications, starting with the most sedating anticonvulsant (phenobarbital). 3. Continue to avoid Vimpat due to questionable heart block noted on telemetry 4. Continue glucose control.  5. Repeat MRI brain withcontrast after EEG leads removed  6. LTM EEG report from this morning is pending. No electrographic seizures seen on preliminary review at the bedside. LTM to be discontinued if report is negative for electrographic seizures.   40 minutes spent in the neurological evaluation and management of this critically ill patient.     LOS: 4 days   _0  signed: Dr. Kerney Elbe 05/11/2018  10:23 AM

## 2018-05-12 ENCOUNTER — Inpatient Hospital Stay (HOSPITAL_COMMUNITY): Payer: Medicaid Other

## 2018-05-12 DIAGNOSIS — J9601 Acute respiratory failure with hypoxia: Secondary | ICD-10-CM

## 2018-05-12 LAB — BASIC METABOLIC PANEL
Anion gap: 5 (ref 5–15)
BUN: 9 mg/dL (ref 6–20)
CO2: 28 mmol/L (ref 22–32)
Calcium: 9.1 mg/dL (ref 8.9–10.3)
Chloride: 105 mmol/L (ref 98–111)
Creatinine, Ser: 1.49 mg/dL — ABNORMAL HIGH (ref 0.44–1.00)
GFR calc Af Amer: 48 mL/min — ABNORMAL LOW (ref >60)
GFR calc non Af Amer: 41 mL/min — ABNORMAL LOW (ref >60)
Glucose, Bld: 118 mg/dL — ABNORMAL HIGH (ref 70–99)
Potassium: 4.5 mmol/L (ref 3.5–5.1)
Sodium: 138 mmol/L (ref 135–145)

## 2018-05-12 LAB — GLUCOSE, CAPILLARY
GLUCOSE-CAPILLARY: 66 mg/dL — AB (ref 70–99)
GLUCOSE-CAPILLARY: 107 mg/dL — AB (ref 70–99)
Glucose-Capillary: 104 mg/dL — ABNORMAL HIGH (ref 70–99)
Glucose-Capillary: 117 mg/dL — ABNORMAL HIGH (ref 70–99)
Glucose-Capillary: 119 mg/dL — ABNORMAL HIGH (ref 70–99)
Glucose-Capillary: 92 mg/dL (ref 70–99)
Glucose-Capillary: 94 mg/dL (ref 70–99)
Glucose-Capillary: 99 mg/dL (ref 70–99)

## 2018-05-12 LAB — CBC
HCT: 35.1 % — ABNORMAL LOW (ref 36.0–46.0)
Hemoglobin: 9.9 g/dL — ABNORMAL LOW (ref 12.0–15.0)
MCH: 20.9 pg — AB (ref 26.0–34.0)
MCHC: 28.2 g/dL — ABNORMAL LOW (ref 30.0–36.0)
MCV: 74.2 fL — ABNORMAL LOW (ref 80.0–100.0)
Platelets: 259 10*3/uL (ref 150–400)
RBC: 4.73 MIL/uL (ref 3.87–5.11)
RDW: 17.2 % — ABNORMAL HIGH (ref 11.5–15.5)
WBC: 8.6 10*3/uL (ref 4.0–10.5)
nRBC: 0 % (ref 0.0–0.2)

## 2018-05-12 LAB — CULTURE, BLOOD (ROUTINE X 2)
Culture: NO GROWTH
Culture: NO GROWTH
Special Requests: ADEQUATE
Special Requests: ADEQUATE

## 2018-05-12 MED ORDER — WHITE PETROLATUM EX OINT
TOPICAL_OINTMENT | CUTANEOUS | Status: AC
  Filled 2018-05-12: qty 28.35

## 2018-05-12 MED ORDER — PRO-STAT SUGAR FREE PO LIQD
30.0000 mL | Freq: Three times a day (TID) | ORAL | Status: DC
  Administered 2018-05-12 – 2018-05-13 (×4): 30 mL
  Filled 2018-05-12 (×4): qty 30

## 2018-05-12 MED ORDER — JEVITY 1.2 CAL PO LIQD
1000.0000 mL | ORAL | Status: DC
  Administered 2018-05-12: 1000 mL
  Filled 2018-05-12 (×3): qty 1000

## 2018-05-12 NOTE — Evaluation (Signed)
Clinical/Bedside Swallow Evaluation Patient Details  Name: Rebecca Johnston MRN: [id number] Date of Birth: 19-Oct-1970  Today's Date: 05/12/2018 Time: SLP Start Time (ACUTE ONLY): 5726 SLP Stop Time (ACUTE ONLY): 0948 SLP Time Calculation (min) (ACUTE ONLY): 20 min  Past Medical History:  Past Medical History:  Diagnosis Date  . Anemia   . Asthma   . Diabetes mellitus without complication ([place]) 20/3559  . Fibroids   . GERD (gastroesophageal reflux disease)   . History of blood transfusion Feb 2015  . Obesity, Class III, BMI 40-49.9 (morbid obesity) ([place])   . Shortness of breath dyspnea    Past Surgical History:  Past Surgical History:  Procedure Laterality Date  . ABDOMINAL HYSTERECTOMY N/A 12/08/2014   Procedure: HYSTERECTOMY ABDOMINAL;  Surgeon: Woodroe Mode, MD;  Location: [facility];  Service: Gynecology;  Laterality: N/A;  . DILATION AND CURETTAGE OF UTERUS    . DILITATION & CURRETTAGE/HYSTROSCOPY WITH VERSAPOINT RESECTION N/A 01/06/2014   Procedure: Corena Pilgrim WITH VERSAPOINT;  Surgeon: Woodroe Mode, MD;  Location: [facility];  Service: Gynecology;  Laterality: N/A;  . LAPAROSCOPIC LYSIS OF ADHESIONS N/A 12/08/2014   Procedure: LAPAROSCOPIC LYSIS OF ADHESIONS;  Surgeon: Michael Boston, MD;  Location: [facility];  Service: General;  Laterality: N/A;  . OVARIAN CYST REMOVAL     cyst not removed. Cyst drained  . SUPRA-UMBILICAL HERNIA N/A 74/03/6382   Procedure: LAPARSCOPIC SUPRA-UMBILICAL HERNIA REPAIR ;  Surgeon: Michael Boston, MD;  Location: [facility];  Service: General;  Laterality: N/A;  . VENTRAL HERNIA REPAIR N/A 12/08/2014   Procedure: LAPAROSCOPIC INCARCERATED INCISIONAL VENTRAL WALL HERNIA REPAIR;  Surgeon: Michael Boston, MD;  Location: [facility];  Service: General;  Laterality: N/A;   HPI:  Pt is a 48 y.o. female with medical history significant of diabetes mellitus, asthma, cardiac, obesity, CKD stage III, who presented with headache, blurry vision, dizziness. Patient stated that she has  been having headaches for more than 1 week in the right temporal area, constant, 8 out of 10 severity, nonradiating. She also reported right leg weakness, slurred speech, and facial droop. CT of the head and MRI were negative for acute pathology. Pt was found obtunded only responsive to pain on 3/8 and was intubated from 05/09/18 to 05/11/18.  EEG 3/6 right temporal complex partial seizure. EEG 3/10 showed evidence of an improving diffuse encephalopathy. Chest x-ray of 05/11/18 revealed lower lobe atelectatic change bilaterally but a degree of superimposed pneumonia in these areas could not be excluded entirely.    Assessment / Plan / Recommendation Clinical Impression  Pt denied a history of oropharyngeal dysphagia prior to admission and stated that she consumed a regular texture diet. Oral mechanism exam was significant for right-sided facial weakness and reduced lingual strength when opposing pressure was applied with a tongue depressor. Pt tolerated puree solids and  regular texture solids without overt s/sx of aspiration but throat clearing and coughing were inconsistently demonstrated with thin liquids via cup and straw. Mild residue was observed in the right lateral sulcus with regular texture solids. Pt currently has a Cortrak in place and it is recommended that her NPO status be continued with enteral nutrition. SLP will follow to assess improvement in swallow function and the need for instrumental assessment based on her progress.  SLP Visit Diagnosis: Dysphagia, oropharyngeal phase (R13.12)    Aspiration Risk       Diet Recommendation NPO;Alternative means - temporary;Ice chips PRN after oral care   Medication Administration: Via alternative means  Other  Recommendations Oral Care Recommendations: Oral care QID   Follow up Recommendations Other (comment)(Continued SLP services )      Frequency and Duration min 2x/week  2 weeks       Prognosis Prognosis for Safe Diet Advancement:  Good Barriers to Reach Goals: Cognitive deficits      Swallow Study   General Date of Onset: 05/10/18 HPI: Pt is a 49 y.o. female with medical history significant of diabetes mellitus, asthma, cardiac, obesity, CKD stage III, who presented with headache, blurry vision, dizziness. Patient stated that she has been having headaches for more than 1 week in the right temporal area, constant, 8 out of 10 severity, nonradiating. She also reported right leg weakness, slurred speech, and facial droop. CT of the head and MRI were negative for acute pathology. Pt was found obtunded only responsive to pain on 3/8 and was intubated from 05/09/18 to 05/11/18.  EEG 3/6 right temporal complex partial seizure. EEG 3/10 showed evidence of an improving diffuse encephalopathy. Chest x-ray of 05/11/18 revealed lower lobe atelectatic change bilaterally but a degree of superimposed pneumonia in these areas could not be excluded entirely.  Type of Study: Bedside Swallow Evaluation Previous Swallow Assessment: None Diet Prior to this Study: NPO;NG Tube Temperature Spikes Noted: No Respiratory Status: Room air History of Recent Intubation: Yes Length of Intubations (days): 2 days Date extubated: 05/11/18 Behavior/Cognition: Alert;Cooperative;Pleasant mood;Impulsive Oral Cavity Assessment: Within Functional Limits Oral Care Completed by SLP: Recent completion by staff Oral Cavity - Dentition: Adequate natural dentition Vision: Functional for self-feeding Self-Feeding Abilities: Needs assist Patient Positioning: Upright in chair;Postural control adequate for testing Baseline Vocal Quality: Other (comment)(rough`) Volitional Cough: Strong Volitional Swallow: Able to elicit    Oral/Motor/Sensory Function Overall Oral Motor/Sensory Function: Mild impairment Facial ROM: Reduced right;Suspected CN VII (facial) dysfunction Facial Symmetry: Within Functional Limits Facial Strength: Reduced right;Suspected CN VII (facial)  dysfunction Facial Sensation: Within Functional Limits Lingual ROM: Within Functional Limits Lingual Symmetry: Within Functional Limits Lingual Strength: Reduced;Suspected CN XII (hypoglossal) dysfunction Lingual Sensation: Within Functional Limits Velum: Other (comment)(Unable to visualize velum) Mandible: Within Functional Limits   Ice Chips Ice chips: Within functional limits Presentation: Spoon   Thin Liquid Thin Liquid: Impaired Presentation: Cup;Straw(Spoon WFL) Oral Phase Impairments: Reduced labial seal(R) Pharyngeal  Phase Impairments: Throat Clearing - Immediate;Cough - Immediate;Cough - Delayed    Nectar Thick Nectar Thick Liquid: Not tested   Honey Thick Honey Thick Liquid: Not tested   Puree Puree: Within functional limits   Solid     Solid: Impaired Presentation: Self Fed Oral Phase Functional Implications: Right lateral sulci pocketing;Oral residue     Laporche Martelle I. Hardin Negus, [place], [place] Office number [phone] Pager [phone]  Horton Marshall 05/12/2018,9:57 AM

## 2018-05-12 NOTE — Progress Notes (Signed)
Hypoglycemic Event  CBG:  66  Treatment:  Starting tube feed  Symptoms: None  Possible Reasons for Event: Patient is NPO and received orders at time of transfer from 4N to 3W for tube feedings. Tube feeding was not received by 3W until 1645.   Will continue to monitor to assure blood sugar rises with start of tube feed.  Rebecca Johnston

## 2018-05-12 NOTE — Progress Notes (Addendum)
NAME:  DANNIELLE BASKINS, MRN:  [id number], DOB:  25-Mar-1970, LOS: 5 ADMISSION DATE:  05/06/2018, CONSULTATION DATE: 05/09/2018 REFERRING MD:  Vernell Leep, MD CHIEF COMPLAINT: Status epilepticus  Brief History   48 year old female transferred from [facility] for LTM monitoring.  Transferred to ICU for EEG demonstrating multiple right temporal seizures.  PCCM consulted.   Past Medical History  CKD stage III, diabetes type 2, asthma, morbid obesity  Significant Hospital Events   3/5 admitted 3/8 Transfer to ICU; Reports headaches improved. However feels drowsy. Family at bedside including mother and aunt reports episodes of limb jerking <90min followed by sleepiness. Was intubated for airway protection. Required nor-epi for hypotension felt d/t propofol and phenobarb load.  3/9 titrating propofol down watching LTM for seizure activity.  3/10: fully awake. Ready for extubation  Consults:  Neuro  Procedures:  EEG 3/6 right temporal complex partial seizure  Significant Diagnostic Tests:  MR brain 3/5-no stroke, hemorrhage or mass seen CT head/CTA head and neck 3/6-read as normal CT head 3/7-no acute intracranial abnormalities  Micro Data:  BCX 3/6 >  Antimicrobials:    Interim history/subjective:  Awake and following commands  Objective   Blood pressure 98/75, pulse 94, temperature 98.9 F (37.2 C), temperature source Oral, resp. rate (Abnormal) 27, height 5\' 5"  (1.651 m), weight 118.4 kg, last menstrual period 11/26/2014, SpO2 100 %.        Intake/Output Summary (Last 24 hours) at 05/12/2018 0926 Last data filed at 05/12/2018 0048 Gross per 24 hour  Intake 3.1 ml  Output 750 ml  Net -746.9 ml   Filed Weights   05/07/18 0307  Weight: 118.4 kg   Physical Exam:  General: 48 year old female now fully awake and following commands 'HEENT normocephalic atraumatic.  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 Not actively seizing now but not responsive at all. Worried about possible anoxic injury vs just residual sedation.  Plan Cont AEDs per neuro: phenobarb, keppra, dilantin and lyrica.  Supportive care  Acute respiratory failure ->mental status major barrier to weaning ->passed SBT Plan Extubate Asp precaution Wean oxygen  Pulse ox   CKD stage III; looks like baseline ~ 1.6 Plan Trend chem Avoid hypotension Am chem  Fluid and electrolyte imbalance: hyperchloremia and mild NAG metabolic acidosis  Plan Keeping IVFs at [place] [facility] as indicated   Diabetes mellitus type 2 w/ hyperglycemia  HG A1c >14 Plan  ssi   H/o GERD Plan PPI   Best practice:  Diet: NPO Pain/Anxiety/Delirium protocol (if indicated): None VAP protocol (if indicated): No DVT prophylaxis: Lovenox GI prophylaxis: Pepcid Glucose control: As noted above Mobility: BR Code Status: Full. OK with intubation Family Communication: Patient and mother at bedside. Disposition: she remains critically ill but has passed SBT so will require close obs for extubation. Will cont current AEDS  Erick Colace ACNP-BC [place] Pager # [phone] OR # [phone] if no answer  Critical care attending attestation note:    Kipp Brood, MD Staff Critical Care Physician [facility] Pager: [phone] Off hours: [phone]  05/12/2018, 9:26 AM     NAME:  TERRIL AMARO, MRN:  [id number], DOB:  1970-04-17, LOS: 5 ADMISSION DATE:  05/06/2018, CONSULTATION DATE: 05/09/2018 REFERRING MD:  Vernell Leep,  MD CHIEF COMPLAINT: Status epilepticus  Brief History   47 year old female transferred from [facility] for LTM monitoring.  Transferred to ICU for EEG demonstrating multiple right temporal seizures.  PCCM consulted.   Past Medical History  CKD stage III, diabetes type 2, asthma, morbid obesity  Significant Hospital Events   3/5 admitted 3/8 Transfer to ICU; Reports headaches improved. However feels drowsy. Family at bedside including mother and aunt reports episodes of limb jerking <80min followed by sleepiness. Was intubated for airway protection. Required nor-epi for hypotension felt d/t propofol and phenobarb load.  3/9 titrating propofol down watching LTM for seizure activity.  3/10: fully awake. Extubated.  3/11: No distress. Discussed plan of care w/ neurology starting to taper anticonvulsants. Plan to aggressively manage glycemic control   Consults:  Neuro  Procedures:  EEG 3/6 right temporal complex partial seizure  Significant Diagnostic Tests:  MR brain 3/5-no stroke, hemorrhage or mass seen CT head/CTA head and neck 3/6-read as normal CT head 3/7-no acute intracranial abnormalities  Micro Data:  BCX 3/6 > neg   Antimicrobials:    Interim history/subjective:  Doing well post extubation   Objective   Blood pressure 98/75, pulse 94, temperature 98.9 F (37.2 C), temperature source Oral, resp. rate (Abnormal) 27, height 5\' 5"  (1.651 m), weight 118.4 kg, last menstrual period 11/26/2014, SpO2 100 %.        Intake/Output Summary (Last 24 hours) at 05/12/2018 0927 Last data filed at 05/12/2018 0048 Gross per 24 hour  Intake 3.1 ml  Output 750 ml  Net -746.9 ml   Filed Weights   05/07/18 0307  Weight: 118.4 kg   Physical Exam:  General more awake. No distress.  HENT NCAT, no JVD. MMM Pulm clear, decreased bases Card RRR no MRG abd soft not tender tol tubefeeds Ext brisk CR no edema  Neuro awake, alert. Sp slow but oriented. 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 Not actively seizing now but not responsive at all. Worried about possible anoxic injury vs just residual sedation.  Plan Dc phenobarb and dilantin  Cont keppra and lyrica Will need tight glycemic control as seizures were likely exacerbated by this   Acute respiratory failure ->mental status major barrier to weaning ->passed SBT Plan Wean oxygen Mobilize  Aspiration and reflux precautions   Drug induced hypotension Plan norepi if needed   CKD stage III; looks like baseline ~ 1.6 Plan Trend   Fluid and electrolyte imbalance Plan Trend and replace as needed   Diabetes mellitus type 2 w/ hyperglycemia  HG A1c >14 Plan  ssi  Cont lantus   H/o GERD Plan Dc PPI   Best practice:  Diet: NPO Pain/Anxiety/Delirium protocol (if indicated): None VAP protocol (if indicated): No DVT prophylaxis: Lovenox GI prophylaxis: Pepcid Glucose control: As noted above Mobility: BR Code Status: Full. OK with intubation Family Communication: Patient and mother at bedside. Disposition: ready to leave ICU. Tapering AEDs.   Erick Colace ACNP-BC [place] Pager # [phone] OR # [phone] if no answer  05/12/2018, 9:27 AM

## 2018-05-12 NOTE — Progress Notes (Signed)
Rehab Admissions Coordinator Note:  Patient was screened by Retta Diones for appropriateness for an Inpatient Acute Rehab Consult.  At this time, we are recommending Inpatient Rehab consult.  Retta Diones 05/12/2018, 12:17 PM  I can be reached at [phone].

## 2018-05-12 NOTE — Progress Notes (Signed)
Nutrition Follow-up  DOCUMENTATION CODES:   Morbid obesity  INTERVENTION:   Initiate Jevity 1.2 @ 55 ml/hr via Cortrak tube 30 ml Prostat TID  Provides: 1884 kcal, 118 grams protein, and 1070 ml free water.   Monitor swallow evaluation, supplement diet once advanced.   NUTRITION DIAGNOSIS:   Inadequate oral intake related to inability to eat as evidenced by NPO status. Ongoing.   GOAL:   Patient will meet greater than or equal to 90% of their needs Progressing   MONITOR:   Diet advancement, TF tolerance  REASON FOR ASSESSMENT:   Consult Enteral/tube feeding initiation and management  ASSESSMENT:   Patient with PMH significant for DM, asthma, CKD III, and GERD. Admitted to [facility] for LTM monitoring. Transferred to [facility] 3/8 for EEG showing multiple right temporal seizures.   Pt discussed during ICU rounds and with RN.   3/10 extubated 3/11 failed swallow eval, has Cortrak in place but no feeding currently. Plan for [facility] 3/12   Medications reviewed and include: SSI, 10 units lantus daily Labs reviewed Lab Results  Component Value Date   HGBA1C 14.9 (H) 05/06/2018    Diet Order:   Diet Order            Diet NPO time specified Except for: Ice Chips, Sips with Meds  Diet effective now              EDUCATION NEEDS:   Not appropriate for education at this time  Skin:  Skin Assessment: Skin Integrity Issues: Skin Integrity Issues:: Other (Comment) Other: MASD- breast/groin  Last BM:  3/7 large  Height:   Ht Readings from Last 1 Encounters:  05/07/18 5\' 5"  (1.651 m)    Weight:   Wt Readings from Last 1 Encounters:  05/07/18 118.4 kg    Ideal Body Weight:  56.8 kg  BMI:  Body mass index is 43.44 kg/m.  Estimated Nutritional Needs:   Kcal:  1800-2000  Protein:  100-120 grams  Fluid:  > 1.8 L/day  Maylon Peppers RD, LDN, CNSC [phone] Pager [phone] After Hours Pager

## 2018-05-12 NOTE — Evaluation (Signed)
Physical Therapy Evaluation Patient Details Name: Rebecca Johnston MRN: [id number] DOB: 02-03-1971 Today's Date: 05/12/2018   History of Present Illness  48 y.o. female admitted on 05/08/28 for HA, blurry vision, dizziness.  MRI is negative for stroke.  EEG (+) for R temporal seizures.  Inutbated for airway protection 3/8-3/10/20.  Hypotensive requiring nor-epi.  CT and follow up CT are negative.  Pt with other significant PMH of DM, asthma, and anemia.  Clinical Impression  Pt was able to transfer OOB to chair with RW and two person mod assist.  PT is very ataxic, weaker on the right, and has significant cognitive deficits compared to baseline.  She has central nystagmus and is very unsteady on her feet.  She would benefit from post acute intensive, multidisciplinary rehab.   PT to follow acutely for deficits listed below.      Follow Up Recommendations CIR    Equipment Recommendations  Rolling walker with 5" wheels    Recommendations for Other Services Rehab consult     Precautions / Restrictions Precautions Precautions: Fall Precaution Comments: ataxic with decreased safety awareness      Mobility  Bed Mobility Overal bed mobility: Needs Assistance Bed Mobility: Supine to Sit     Supine to sit: Mod assist;HOB elevated     General bed mobility comments: Mod assist ofr balanceand to keep pt from falling over once upright.    Transfers Overall transfer level: Needs assistance Equipment used: Rolling walker (2 wheeled) Transfers: Sit to/from [facility] Sit to Stand: Mod assist;+2 safety/equipment Stand pivot transfers: Mod assist;+2 safety/equipment       General transfer comment: Pt stood multiple times with RW EOB to strip wet sheets.  Mod assist and second person to stabilize recliner chair as pt took ataxic steps over and was unable to keep all of the points of the RW on the ground.  Uncontrolled "flop" to the chair.   Ambulation/Gait              General Gait Details: Not safe enough to test today.          Balance Overall balance assessment: Needs assistance Sitting-balance support: Feet supported;Bilateral upper extremity supported Sitting balance-Leahy Scale: Poor Sitting balance - Comments: Needs mod assist EOB due to right lateral lean.  Postural control: Right lateral lean Standing balance support: Bilateral upper extremity supported Standing balance-Leahy Scale: Poor Standing balance comment: needs mod assist, support from therapist and RW                             Pertinent Vitals/Pain Pain Assessment: No/denies pain    Home Living Family/patient expects to be discharged to:: Private residence Living Arrangements: Children(10 y.o. and 93 y.o.) Available Help at Discharge: Family;Available PRN/intermittently                  Prior Function Level of Independence: Independent         Comments: works, drives        Extremity/Trunk Assessment   Upper Extremity Assessment Upper Extremity Assessment: Defer to OT evaluation    Lower Extremity Assessment Lower Extremity Assessment: RLE deficits/detail RLE Deficits / Details: right leg weaker than left leg, functionally, at least 3/5, but very uncoordinated RLE Coordination: decreased gross motor;decreased fine motor    Cervical / Trunk Assessment Cervical / Trunk Assessment: Normal  Communication   Communication: Other (comment)(dysarthric)  Cognition Arousal/Alertness: Awake/alert Behavior During Therapy: Impulsive Overall Cognitive  Status: Impaired/Different from baseline Area of Impairment: Attention;Memory;Following commands;Safety/judgement;Awareness;Problem solving                   Current Attention Level: Sustained   Following Commands: Follows one step commands inconsistently;Follows one step commands with increased time Safety/Judgement: Decreased awareness of safety;Decreased awareness of deficits Awareness:  Intellectual Problem Solving: Slow processing;Difficulty sequencing;Requires verbal cues;Requires tactile cues General Comments: Pt impulsive, slow to process information and commands.  When asked if she could safely get back to the bed on her own she said, "yes"      General Comments General comments (skin integrity, edema, etc.): BP is soft, RN aware, O2 100% on RA at end of session.         Assessment/Plan    PT Assessment Patient needs continued PT services  PT Problem List Decreased strength;Decreased activity tolerance;Decreased balance;Decreased mobility;Decreased coordination;Decreased cognition;Decreased knowledge of use of DME;Decreased safety awareness;Decreased knowledge of precautions;Obesity       PT Treatment Interventions DME instruction;Gait training;Stair training;Functional mobility training;Therapeutic activities;Balance training;Therapeutic exercise;Patient/family education    PT Goals (Current goals can be found in the Care Plan section)  Acute Rehab PT Goals Patient Stated Goal: to walk PT Goal Formulation: Patient unable to participate in goal setting Time For Goal Achievement: 05/26/18 Potential to Achieve Goals: Good    Frequency Min 3X/week           AM-PAC PT "6 Clicks" Mobility  Outcome Measure Help needed turning from your back to your side while in a flat bed without using bedrails?: A Little Help needed moving from lying on your back to sitting on the side of a flat bed without using bedrails?: A Lot Help needed moving to and from a bed to a chair (including a wheelchair)?: A Lot Help needed standing up from a chair using your arms (e.g., wheelchair or bedside chair)?: A Lot Help needed to walk in hospital room?: Total Help needed climbing 3-5 steps with a railing? : Total 6 Click Score: 11    End of Session Equipment Utilized During Treatment: Gait belt Activity Tolerance: Patient tolerated treatment well Patient left: in chair;with call  bell/phone within reach;with chair alarm set;Other (comment)(with SLP in room) Nurse Communication: Mobility status;Need for lift equipment(use steady standing frame) PT Visit Diagnosis: Muscle weakness (generalized) (M62.81);Difficulty in walking, not elsewhere classified (R26.2);Ataxic gait (R26.0);Unsteadiness on feet (R26.81);Other symptoms and signs involving the nervous system (E99.371)    Time: 6967-8938 PT Time Calculation (min) (ACUTE ONLY): 63 min   Charges:          Wells Guiles B. Kiaya [place], PT, DPT  Acute Rehabilitation [phone] pager #[phone] office   PT Evaluation $PT Eval Moderate Complexity: 1 Mod PT Treatments $Therapeutic Activity: 8-22 mins $Neuromuscular Re-education: 23-37 mins       05/12/2018, 10:35 AM

## 2018-05-12 NOTE — Progress Notes (Signed)
See progress note dated 3/11 Initial note brought forward twice in error.  The updated note reflecting 3/11 is on the bottom of the note starting at the second [place] care letter head   Erick Colace ACNP-BC [place] Pager # [phone] OR # [phone] if no answer

## 2018-05-12 NOTE — Progress Notes (Addendum)
Subjective: Central line was removed yesterday. She is now extubated.   Objective: Current vital signs: BP 98/75   Pulse 94   Temp 98.9 F (37.2 C) (Oral)   Resp (!) 27   Ht _0  (1.651 m)   Wt 118.4 kg   LMP 11/26/2014 (Exact Date)   SpO2 100%   BMI 43.44 kg/m  Vital signs in last 24 hours: Temp:  [98.3 F (36.8 C)-100.9 F (38.3 C)] 98.9 F (37.2 C) (03/11 0800) Pulse Rate:  [44-101] 94 (03/11 0800) Resp:  [15-28] 27 (03/11 0800) BP: (97-128)/(63-89) 98/75 (03/11 0800) SpO2:  [99 %-100 %] 100 % (03/11 0800)  Intake/Output from previous day: 03/10 0701 - 03/11 0700 In: 148.1 [I.V.:8.1; NG/GT:40; IV Piggyback:100] Out: 810 [Urine:810] Intake/Output this shift: No intake/output data recorded. Nutritional status:  Diet Order            Diet NPO time specified Except for: Ice Chips, Sips with Meds  Diet effective now             HEENT: [place]/AT Lungs: Coughing intermittently  Neurologic Exam: Ment: Opens eyes and tracks to left and right; will follow simple commands after a significant delay. Able to answer simple questions and name objects. No neglect. CN: Fixates on examiner's face briefly. Will track horizontally to left and right but with saccadic pursuits. No nystagmus. Grimace is symmetric. Hypophonic speech in the context of recent extubation. Tongue midline.   Motor: Moves all 4 extremities equally against resistance with 4-5/5 strength. No asymmetry.   Cerebellar: Dyssinergia with FNF bilaterally   Lab Results: Results for orders placed or performed during the hospital encounter of 05/06/18 (from the past 48 hour(s))  Glucose, capillary     Status: Abnormal   Collection Time: 05/10/18 11:54 AM  Result Value Ref Range   Glucose-Capillary 180 (H) 70 - 99 mg/dL   Comment 1 Notify RN    Comment 2 Document in Chart   Magnesium     Status: None   Collection Time: 05/10/18 12:40 PM  Result Value Ref Range   Magnesium 2.2 1.7 - 2.4 mg/dL    Comment: Performed  at [facility], [place] [address], [place], [place] [address]  Phosphorus     Status: None   Collection Time: 05/10/18 12:40 PM  Result Value Ref Range   Phosphorus 3.9 2.5 - 4.6 mg/dL    Comment: Performed at [facility] [address] [place], [place] [address]  Glucose, capillary     Status: Abnormal   Collection Time: 05/10/18  3:40 PM  Result Value Ref Range   Glucose-Capillary 147 (H) 70 - 99 mg/dL   Comment 1 Notify RN    Comment 2 Document in Chart   Magnesium     Status: None   Collection Time: 05/10/18  5:14 PM  Result Value Ref Range   Magnesium 2.3 1.7 - 2.4 mg/dL    Comment: Performed at [facility], [place] [address] [place], [place] [address]  Phosphorus     Status: None   Collection Time: 05/10/18  5:14 PM  Result Value Ref Range   Phosphorus 4.0 2.5 - 4.6 mg/dL    Comment: Performed at [facility] [address], [place], [place] [address]  Glucose, capillary     Status: Abnormal   Collection Time: 05/10/18  8:08 PM  Result Value Ref Range   Glucose-Capillary 110 (H) 70 - 99 mg/dL  Glucose, capillary  Status: Abnormal   Collection Time: 05/10/18 11:48 PM  Result Value Ref Range   Glucose-Capillary 122 (H) 70 - 99 mg/dL  Glucose, capillary     Status: Abnormal   Collection Time: 05/11/18  3:58 AM  Result Value Ref Range   Glucose-Capillary 126 (H) 70 - 99 mg/dL  Basic metabolic panel     Status: Abnormal   Collection Time: 05/11/18  5:00 AM  Result Value Ref Range   Sodium 137 135 - 145 mmol/L   Potassium 4.0 3.5 - 5.1 mmol/L   Chloride 108 98 - 111 mmol/L   CO2 21 (L) 22 - 32 mmol/L   Glucose, Bld 175 (H) 70 - 99 mg/dL   BUN 10 6 - 20 mg/dL   Creatinine, Ser 1.64 (H) 0.44 - 1.00 mg/dL   Calcium 8.7 (L) 8.9 - 10.3 mg/dL   GFR calc non Af Amer 37 (L) >60 mL/min   GFR calc Af Amer 42 (L) >60 mL/min   Anion gap 8 5 - 15    Comment: Performed at [facility], [place] [address], [place], [place] [address]  CBC     Status:  Abnormal   Collection Time: 05/11/18  5:00 AM  Result Value Ref Range   WBC 9.2 4.0 - 10.5 K/uL   RBC 4.80 3.87 - 5.11 MIL/uL   Hemoglobin 10.3 (L) 12.0 - 15.0 g/dL   HCT 34.8 (L) 36.0 - 46.0 %   MCV 72.5 (L) 80.0 - 100.0 fL   MCH 21.5 (L) 26.0 - 34.0 pg   MCHC 29.6 (L) 30.0 - 36.0 g/dL   RDW 16.6 (H) 11.5 - 15.5 %   Platelets 236 150 - 400 K/uL   nRBC 0.0 0.0 - 0.2 %    Comment: Performed at [facility], [place] [address], [place], [place] [address]  Magnesium     Status: None   Collection Time: 05/11/18  5:00 AM  Result Value Ref Range   Magnesium 2.1 1.7 - 2.4 mg/dL    Comment: Performed at [facility], [place] [address], [place], [place] [address]  Phosphorus     Status: None   Collection Time: 05/11/18  5:00 AM  Result Value Ref Range   Phosphorus 4.5 2.5 - 4.6 mg/dL    Comment: Performed at [facility] [address] [place], [place] [address]  Glucose, capillary     Status: Abnormal   Collection Time: 05/11/18  8:15 AM  Result Value Ref Range   Glucose-Capillary 156 (H) 70 - 99 mg/dL   Comment 1 Notify RN    Comment 2 Document in Chart   Glucose, capillary     Status: Abnormal   Collection Time: 05/11/18 12:10 PM  Result Value Ref Range   Glucose-Capillary 126 (H) 70 - 99 mg/dL   Comment 1 Notify RN    Comment 2 Document in Chart   Glucose, capillary     Status: None   Collection Time: 05/11/18  4:22 PM  Result Value Ref Range   Glucose-Capillary 96 70 - 99 mg/dL   Comment 1 Notify RN    Comment 2 Document in Chart   Glucose, capillary     Status: None   Collection Time: 05/11/18  8:15 PM  Result Value Ref Range   Glucose-Capillary 93 70 - 99 mg/dL  Glucose, capillary     Status: None   Collection Time: 05/11/18 11:45 PM  Result Value Ref Range   Glucose-Capillary  92 70 - 99 mg/dL  Glucose, capillary     Status: None   Collection Time: 05/12/18  3:56 AM  Result Value Ref Range   Glucose-Capillary 94 70 - 99 mg/dL  Basic metabolic panel      Status: Abnormal   Collection Time: 05/12/18  4:28 AM  Result Value Ref Range   Sodium 138 135 - 145 mmol/L   Potassium 4.5 3.5 - 5.1 mmol/L   Chloride 105 98 - 111 mmol/L   CO2 28 22 - 32 mmol/L   Glucose, Bld 118 (H) 70 - 99 mg/dL   BUN 9 6 - 20 mg/dL   Creatinine, Ser 1.49 (H) 0.44 - 1.00 mg/dL   Calcium 9.1 8.9 - 10.3 mg/dL   GFR calc non Af Amer 41 (L) >60 mL/min   GFR calc Af Amer 48 (L) >60 mL/min   Anion gap 5 5 - 15    Comment: Performed at [facility], [place] [address] [place], [place] [address]  CBC     Status: Abnormal   Collection Time: 05/12/18  4:28 AM  Result Value Ref Range   WBC 8.6 4.0 - 10.5 K/uL   RBC 4.73 3.87 - 5.11 MIL/uL   Hemoglobin 9.9 (L) 12.0 - 15.0 g/dL   HCT 35.1 (L) 36.0 - 46.0 %   MCV 74.2 (L) 80.0 - 100.0 fL   MCH 20.9 (L) 26.0 - 34.0 pg   MCHC 28.2 (L) 30.0 - 36.0 g/dL   RDW 17.2 (H) 11.5 - 15.5 %   Platelets 259 150 - 400 K/uL   nRBC 0.0 0.0 - 0.2 %    Comment: Performed at [facility], [place] [address], [place], [place] [address]  Glucose, capillary     Status: None   Collection Time: 05/12/18  8:33 AM  Result Value Ref Range   Glucose-Capillary 99 70 - 99 mg/dL   Comment 1 Notify RN    Comment 2 Document in Chart     Recent Results (from the past 240 hour(s))  Culture, blood (Routine X 2) w Reflex to ID Panel     Status: None   Collection Time: 05/07/18  1:40 AM  Result Value Ref Range Status   Specimen Description   Final    BLOOD RIGHT ANTECUBITAL Performed at [facility] [address] [place], [place] [address]    Special Requests   Final    BOTTLES DRAWN AEROBIC AND ANAEROBIC Blood Culture adequate volume Performed at [facility] [address] [place], [place] [address]    Culture   Final    NO GROWTH 5 DAYS Performed at [facility], [place] [address], [place], [place] [address]    Report Status 05/12/2018 FINAL  Final  Culture, blood (Routine X 2) w Reflex to  ID Panel     Status: None   Collection Time: 05/07/18  3:31 AM  Result Value Ref Range Status   Specimen Description   Final    BLOOD LEFT HAND Performed at [facility], [place] [address] [place], [place] [address]    Special Requests   Final    BOTTLES DRAWN AEROBIC AND ANAEROBIC Blood Culture adequate volume Performed at [facility] [address], [place], [place] [address]    Culture   Final    NO GROWTH 5 DAYS Performed at [facility], [place] [address] [place], [place] [address]    Report  Status 05/12/2018 FINAL  Final  MRSA PCR Screening     Status: None   Collection Time: 05/09/18 12:45 PM  Result Value Ref Range Status   MRSA by PCR NEGATIVE NEGATIVE Final    Comment:        The GeneXpert MRSA Assay (FDA approved for NASAL specimens only), is one component of a comprehensive MRSA colonization surveillance program. It is not intended to diagnose MRSA infection nor to guide or monitor treatment for MRSA infections. Performed at [facility], [place] [address] [place], [place] [address]     Lipid Panel Recent Labs    05/09/18 2049  TRIG 320*    Studies/Results: Dg Chest Port 1 View  Result Date: 05/12/2018 CLINICAL DATA:  Shortness of breath EXAM: PORTABLE CHEST 1 VIEW COMPARISON:  May 09, 2018 FINDINGS: Endotracheal tube no longer apparent. Central catheter has been removed as well. Feeding tube tip is below the diaphragm. No pneumothorax. There is patchy atelectatic change in both lower lobes. Lungs elsewhere clear. Heart is upper normal in size with pulmonary vascularity normal. No adenopathy. No bone lesions. IMPRESSION: Feeding tube tip below diaphragm. No pneumothorax. Lower lobe atelectatic change bilaterally. A degree of superimposed pneumonia in these areas cannot be excluded entirely. Lungs elsewhere clear. Stable cardiac silhouette. Electronically Signed   By: Lowella Grip III M.D.   On: 05/12/2018 08:11     Medications:  Scheduled: . chlorhexidine gluconate (MEDLINE KIT)  15 mL Mouth Rinse BID  . enoxaparin (LOVENOX) injection  60 mg Subcutaneous Daily  . fluticasone  2 spray Each Nare Daily  . insulin aspart  0-9 Units Subcutaneous Q4H  . insulin glargine  10 Units Subcutaneous Daily  . mouth rinse  15 mL Mouth Rinse 10 times per day  . pantoprazole sodium  40 mg Per Tube Q1200  . PHENObarbital  65 mg Intravenous QHS  . phenytoin (DILANTIN) IV  100 mg Intravenous Q8H  . pregabalin  200 mg Per Tube TID   Continuous: . sodium chloride    . levETIRAcetam 1,500 mg (05/12/18 0847)   This EEG shows evidence of an improving diffuse encephalopathy. No epileptiform discharges or EEG seizures were recorded.  Assessment: 48 year old female with refractory focal status epilepticusand Todd's paresis, now resolved. No prior history of seizures. Most likely precipitated by severe hyperglycemia.  1.Etiology:Most likely precipitated bysevere hyperglycemia prior to admission. Although no acute abnormality on MRI, there is subtleT2 hypodensity noted in the right occipital lobe, which would be consistent with seizures precipitated by severe hyperglycemia.  2. Dyssinergia on cerebellar exam may be due to combined effects of multiple anticonvulsants.    Recommendations: 1. Start tapering off excess anticonvulsants. Discontinue phenobarbital and Dilantin today. Continue Keppra at 1500 mgBID and Lyrica 200 mgTID.  2. Monitor closely 3. Continue glycemic control.  4. OOB to chair as soon as she is able.  5. Discussed with CCM  35 minutes spent in the neurological evaluation and management of this patient. Time spent included coordination of care.    LOS: 5 days   _0  signed: Dr. Kerney Elbe 05/12/2018  9:08 AM

## 2018-05-13 ENCOUNTER — Other Ambulatory Visit: Payer: Self-pay

## 2018-05-13 ENCOUNTER — Inpatient Hospital Stay (HOSPITAL_COMMUNITY): Payer: Medicaid Other

## 2018-05-13 LAB — GLUCOSE, CAPILLARY
Glucose-Capillary: 201 mg/dL — ABNORMAL HIGH (ref 70–99)
Glucose-Capillary: 173 mg/dL — ABNORMAL HIGH (ref 70–99)
Glucose-Capillary: 128 mg/dL — ABNORMAL HIGH (ref 70–99)
Glucose-Capillary: 113 mg/dL — ABNORMAL HIGH (ref 70–99)
Glucose-Capillary: 156 mg/dL — ABNORMAL HIGH (ref 70–99)

## 2018-05-13 LAB — BASIC METABOLIC PANEL
Anion gap: 8 (ref 5–15)
BUN: 18 mg/dL (ref 6–20)
CO2: 25 mmol/L (ref 22–32)
Calcium: 8.8 mg/dL — ABNORMAL LOW (ref 8.9–10.3)
Chloride: 101 mmol/L (ref 98–111)
Creatinine, Ser: 1.45 mg/dL — ABNORMAL HIGH (ref 0.44–1.00)
GFR calc Af Amer: 49 mL/min — ABNORMAL LOW (ref >60)
GFR calc non Af Amer: 42 mL/min — ABNORMAL LOW (ref >60)
Glucose, Bld: 197 mg/dL — ABNORMAL HIGH (ref 70–99)
Potassium: 3.8 mmol/L (ref 3.5–5.1)
Sodium: 134 mmol/L — ABNORMAL LOW (ref 135–145)

## 2018-05-13 MED ORDER — ACETAMINOPHEN 325 MG PO TABS
650.0000 mg | ORAL_TABLET | Freq: Four times a day (QID) | ORAL | Status: DC | PRN
  Administered 2018-05-14: 650 mg via ORAL
  Filled 2018-05-13: qty 2

## 2018-05-13 MED ORDER — PREGABALIN 75 MG PO CAPS
200.0000 mg | ORAL_CAPSULE | Freq: Three times a day (TID) | ORAL | Status: DC
  Administered 2018-05-13 – 2018-05-14 (×3): 200 mg via ORAL
  Filled 2018-05-13 (×3): qty 2

## 2018-05-13 NOTE — PMR Pre-admission (Signed)
[facility] PMR Admission Coordinator Pre-Admission Assessment  Patient: Rebecca Johnston is an 48 y.o., female MRN: [id number] DOB: 12/23/1970 Height: _0  (165.1 cm) Weight: 116.9 kg  Insurance Information HMO: No  PPO:       PCP:       IPA:       80/20:       OTHER:   PRIMARY:  Medicaid [place] access      Policy#: [id number]      Subscriber: patient CM Name:        Phone#:       Fax#:   Pre-Cert#:        Employer: works PT Benefits:  Phone #: [phone]     Name: automated  Eff. Date: Eligible 05/12/18 with coverage code MAFCN     Deduct:        Out of Pocket Max:        Life Max:   CIR:        SNF:   Outpatient:       Co-Pay:   Home Health:        Co-Pay:   DME:       Co-Pay:   Providers:    Medicaid Application Date:        Case Manager:  Disability Application Date:        Case Worker:    Emergency Contact Information Contact Information    Name Relation Home Work Mobile   Johnston,Christine Mother [phone]  [phone]      Current Medical History  Patient Admitting Diagnosis: R temporal seizures  History of Present Illness:a 48 year old right-handed female with history of diabetes mellitus maintained on Amaryl 2 mg daily, asthma quit smoking 2 years ago, CKDstage III,GERD, morbid obesity with BMI 43.4. Per chart review patient lives with her children ages 44 and 21. Independent prior to admission still working and drives. Presented 05/06/2018 to [facility] with headache, blurred vision and dizziness 1 week. Headache located right temporal area. Patient did note some slurred speech and facial droop as well as occasional nausea with episode of vomiting. Noted recently community-acquired pneumonia 04/29/2018 with persistent cough nasal congestion she was placed on doxycycline by EDP.Marland Kitchen CT/MRI showed no acute stroke or mass effect. CT angiogram of head and neck. Urine drug screen negative. She had elevated glucose on admission of 355 bicarbonate low at 18  hyponatremic 127. She did receive Reglan and Compazine for nausea and later that day noted neck deviation with possible eye deviation as well suspect seizure. She was loaded initially with Dilantin changed to Keppra and Lyrica. EEG showed 2 electrographic seizures emanating out of the right temporal region and spreading mostly to the entire right side. Her Keppra was changed to 1500 mg every 12 hours. Subcutaneous Lovenox has been added for DVT prophylaxis. Patient is currently nothing by mouth with nasogastric tube feeds for nutritional support followed by speech therapy. Patient did initially part intubation 05/09/2018 05/11/2018. Bouts of hypotension requiring nor-epi. Therapy evaluations completed with recommendations of physical medicine rehabilitation consult. Patient to be admitted for a comprehensive inpatient rehabilitation program.   Patient's medical record from [facility] has been reviewed by the rehabilitation admission coordinator and physician.  Past Medical History  Past Medical History:  Diagnosis Date  . Anemia   . Asthma   . Diabetes mellitus without complication ([place]) 12/2583  . Fibroids   . GERD (gastroesophageal reflux disease)   . History of blood transfusion Feb 2015  . Obesity, Class  III, BMI 40-49.9 (morbid obesity) ([place])   . Shortness of breath dyspnea     Family History   family history includes Breast cancer in her maternal aunt; Diabetes in her father, maternal aunt, maternal aunt, maternal aunt, and paternal aunt; Hypertension in her mother.  Prior Rehab/Hospitalizations Has the patient had major surgery during 100 days prior to admission? No    Current Medications  Current Facility-Administered Medications:  .  0.9 %  sodium chloride infusion, , Intravenous, PRN, Erick Colace, NP, Stopped at 05/13/18 1029 .  [DISCONTINUED] acetaminophen (TYLENOL) tablet 650 mg, 650 mg, Oral, Q6H PRN, 650 mg at 05/08/18 0828 **OR** acetaminophen (TYLENOL) suppository  650 mg, 650 mg, Rectal, Q6H PRN, Erick Colace, NP .  acetaminophen (TYLENOL) tablet 650 mg, 650 mg, Oral, Q6H PRN, Barb Merino, MD, 650 mg at 05/14/18 0504 .  enoxaparin (LOVENOX) injection 60 mg, 60 mg, Subcutaneous, Daily, Erick Colace, NP, 60 mg at 05/14/18 0825 .  fluticasone (FLONASE) 50 MCG/ACT nasal spray 2 spray, 2 spray, Each Nare, Daily, Erick Colace, NP, 2 spray at 05/14/18 1304 .  hydrALAZINE (APRESOLINE) injection 5 mg, 5 mg, Intravenous, Q2H PRN, Salvadore Dom E, NP .  insulin aspart (novoLOG) injection 0-9 Units, 0-9 Units, Subcutaneous, Q4H, Erick Colace, NP, 2 Units at 05/14/18 1303 .  insulin glargine (LANTUS) injection 10 Units, 10 Units, Subcutaneous, Daily, Erick Colace, NP, 10 Units at 05/14/18 0825 .  levalbuterol (XOPENEX) nebulizer solution 0.63 mg, 0.63 mg, Nebulization, Q6H PRN, Erick Colace, NP .  levETIRAcetam (KEPPRA) tablet 1,000 mg, 1,000 mg, Oral, BID, Svalina, Gorica, MD .  LORazepam (ATIVAN) injection 1 mg, 1 mg, Intravenous, Q2H PRN, Erick Colace, NP, 1 mg at 05/09/18 1800 .  MEDLINE mouth rinse, 15 mL, Mouth Rinse, BID, Ghimire, Kuber, MD, 15 mL at 05/14/18 1005 .  [DISCONTINUED] ondansetron (ZOFRAN) tablet 4 mg, 4 mg, Oral, Q6H PRN **OR** ondansetron (ZOFRAN) injection 4 mg, 4 mg, Intravenous, Q6H PRN, Erick Colace, NP .  pregabalin (LYRICA) capsule 200 mg, 200 mg, Oral, TID, Barb Merino, MD, 200 mg at 05/14/18 1194  Patients Current Diet:   Diet Order            Diet regular Room service appropriate? Yes with Assist; Fluid consistency: Thin  Diet effective now              Precautions / Restrictions Precautions Precautions: Fall Precaution Comments: ataxic with decreased safety awareness Restrictions Weight Bearing Restrictions: No   Has the patient had 2 or more falls or a fall with injury in the past year?No  Prior Activity Level Community (5-7x/wk): works PT 5 days a week, was driving.  Prior  Functional Level Prior Function Level of Independence: Independent Comments: works, drives   Self Care: Did the patient need help bathing, dressing, using the toilet or eating?  Independent  Indoor Mobility: Did the patient need assistance with walking from room to room (with or without device)? Independent  Stairs: Did the patient need assistance with internal or external stairs (with or without device)? Independent  Functional Cognition: Did the patient need help planning regular tasks such as shopping or remembering to take medications? Independent  Home Assistive Devices / Equipment Home Assistive Devices/Equipment: Eyeglasses, CBG Meter  Prior Device Use: Indicate devices/aids used by the patient prior to current illness, exacerbation or injury? None  Prior Functional Level Comments: works, drives   Prior Functional Level Current Functional Level  Bed Mobility Independent  Min guard assist  Transfers Independent Min assist +2  Mobility - Walk/Wheelchair Independent Min assist  Mobility - Ambulation/Gait Independent Min assist for stability with stepping in place, brief periods of blank stare (~4 to 5 seconds) with attempts at forward locomotion  Upper Body Dressing Independent not attempted  Lower Body Dressing Independent  not attempted  Grooming Independent  not attempted   Eating/Drinking Independent  Supervision   Toilet Transfer Independent  Min assist  Bladder Continence WDL Purwick catheter in place  Bowel Management  WDL Last BM 05/08/18  Stair Climbing WDL Not tested  Communication WDL Other (comment)(dysarthric)  Memory WDL Decreased short-term memory  Cooking/Meal Prep  Independent     Housework  Independent   Money Management  Independent   Driving  Yes, driving     Special needs/care consideration BiPAP/CPAP No, but says she does have sleep apnea CPM No Continuous Drip IV No Dialysis No    Life Vest No Oxygen No Special Bed No Trach Size  No Wound Vac (area) No     Skin No                        Bowel mgmt: Last BM 05/05/18 Bladder mgmt: Purwick external catheter Diabetic mgmt Yes, on oral medications at home  Previous Home Environment Living Arrangements: Children(10 y.o. and 50 y.o.) Available Help at Discharge: Family, Available PRN/intermittently Home Care Services: No  Discharge Living Setting Plans for Discharge Living Setting: Lives with (comment), Apartment(Lives with 12 yo daughter.) Type of Home at Discharge: Apartment(2nd floor apartment) Discharge Home Layout: One level, Able to live on main level with bedroom/bathroom Discharge Home Access: Stairs to enter Entrance Stairs-Rails: Right Entrance Stairs-Number of Steps: at least 10 steps to 2nd level apartment Discharge Bathroom Shower/Tub: Tub/shower unit, Curtain Discharge Bathroom Toilet: Standard Discharge Bathroom Accessibility: Yes How Accessible: Accessible via walker Does the patient have any problems obtaining your medications?: No  Social/Family/Support Systems Patient Roles: Parent(Has a 86 yo daughter, 31 yo son in school) Contact Information: Rebecca Johnston - mom - [phone] Anticipated Caregiver: Mom works.  51 yo son in school.  20 yo dtr at home. Caregiver Availability: Intermittent Discharge Plan Discussed with Primary Caregiver: No(Discussed with patient) Is Caregiver In Agreement with Plan?: Yes Does Caregiver/Family have Issues with Lodging/Transportation while Pt is in Rehab?: No  Goals/Additional Needs Patient/Family Goal for Rehab: PT/OT/SLP  mod I goals Expected length of stay: 7 days Cultural Considerations: None Dietary Needs: NPO Equipment Needs: TBD Pt/Family Agrees to Admission and willing to participate: Yes Program Orientation Provided & Reviewed with Pt/Caregiver Including Roles  & Responsibilities: Yes  Patient Condition: I have reviewed medical records from [facility], spoken with case manager and attending MD,  and patient. I met with patient at the bedside  for inpatient rehabilitation assessment.  Patient will benefit from ongoing PT/OT/SLP, can actively participate in 3 hours of therapy a day 5 days of the week, and can make measurable gains during the admission.  Patient will also benefit from the coordinated team approach during an Inpatient Acute Rehabilitation admission.  The patient will receive intensive therapy as well as Rehabilitation physician, nursing, social worker, and care management interventions.  Due to (bowel management, bladder management, safety, skin/wound care, disease management, medical administration, pain management, patient education) the patient requires 24 hour a day rehabilitation nursing.  The patient is currently min assist with mobility.  Discharge setting and therapy post discharge at home [facility] is anticipated.  Patient has agreed to participate in the Acute Inpatient Rehabilitation Program and will admit today.  Preadmission Screen Completed By: Karene Fry, with brief updates by Michel Santee, 05/14/2018 3:40 PM ______________________________________________________________________   Discussed status with Dr. Naaman Plummer on 05/14/18 at 3:41 PM and received telephone approval for admission today.  Admission Coordinator: Karene Fry, with brief updates by Michel Santee, time 3:41 PM /Date 05/14/18    Assessment/Plan: Diagnosis: status elepticus and associated encephalopathy and debility 1. Does the need for close, 24 hr/day  Medical supervision in concert with the patient's rehab needs make it unreasonable for this patient to be served in a less intensive setting? Yes 2. Co-Morbidities requiring supervision/potential complications: DM2, CKDIII, GERD, morbid obesity 3. Due to bladder management, bowel management, safety, skin/wound care, disease management, medication administration, pain management and patient education, does the patient require 24 hr/day rehab nursing?  Yes 4. Does the patient require coordinated care of a physician, rehab nurse, PT (1-2 hrs/day, 5 days/week), OT (1-2 hrs/day, 5 days/week) and SLP (1-2 hrs/day, 5 days/week) to address physical and functional deficits in the context of the above medical diagnosis(es)? Yes Addressing deficits in the following areas: balance, endurance, locomotion, strength, transferring, bowel/bladder control, bathing, dressing, feeding, grooming, toileting, cognition and psychosocial support 5. Can the patient actively participate in an intensive therapy program of at least 3 hrs of therapy 5 days a week? Yes 6. The potential for patient to make measurable gains while on inpatient rehab is excellent 7. Anticipated functional outcomes upon discharge from inpatients are: modified independent PT, modified independent OT, modified independent SLP 8. Estimated rehab length of stay to reach the above functional goals is: 7-12 days 9. Anticipated D/C setting: Home 10. Anticipated post D/C treatments: [place] therapy 11. Overall Rehab/Functional Prognosis: excellent  Meredith Staggers, MD, [facility] 05/14/2018   Karene Fry, with brief updates by Michel Santee 05/14/2018

## 2018-05-13 NOTE — Progress Notes (Signed)
PROGRESS NOTE    Rebecca Johnston  YTK:[id number] DOB: [birth year] DOA: 05/06/2018 PCP: Ladell Pier, MD    Brief Narrative:  This patient is a 48 year old female who was recently admitted to [facility] with headache, blurred vision, photophobia and dizziness and head movements.  Neurology was consulted and EEG demonstrated partial seizures.  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  During hospitalization, patient continued to have dystonic head movements.  She started getting confused.  She was unable to protect her airway and became very lethargic.  She was started on Keppra for presumed seizure and transferred to [facility] for continuous EEG monitoring.  EEG showed multiple right temporal seizures.  Patient was diagnosed with status epilepticus, loaded with fosphenytoin and Dilantin and moved to ICU for closer monitoring.  She was unable to maintain airway during these episodes and was intubated.  Subsequently extubated to room air and transfer out of ICU today. Admitted to [facility] on 05/06/2018 Transferred to ICU on 05/09/2018 Extubated on 05/11/2018 She was treated with propofol to control the seizure.  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  She was treated with multiple AEDs and propofol.  Her seizures have controlled now.  Neurology on board.  They are discontinuing or tapering off excess seizure medications. Her Dilantin discontinued.  Currently remains on Keppra and Lyrica. All-time seizure precautions. Ativan as needed for recurrence of  seizure.  Acute respiratory failure secondary to status epilepticus: Extubated to room air.  Continue monitoring.  CKD stage III: Stable at baseline.  Type 2 diabetes with uncontrolled blood sugars: Hemoglobin A1c 14.  Patient was on oral hypoglycemics at home.  Patient was started on long-acting insulin and sliding scale insulin.  Given precipitation of seizure with hypoglycemia and uncontrolled blood sugars.  We will continue to uptitrate medications.  She will need to go home on insulin regimen depends upon outcome. I discussed this with the patient and she is agreeable.  Dysphagia: Secondary to intubation.  Currently remains on NG tube feeding.  Followed by speech therapy.  Undergoing swallow evaluation today.  DVT prophylaxis: Lovenox subcu Code Status: Full code Family Communication: No family at bedside Disposition Plan: Home with home health care versus skilled nursing facility. Advance activities today.  Work with PT OT.   Consultants:   PCCM  Neurology  Procedures:   Continuous EEG  Antimicrobials:   None   Subjective: Patient was seen and examined.  She is alert awake and oriented x3.  Patient stated that she may be able to eat.  The tube in the nose is uncomfortable.  Denies any complaints.  Remains weak.  Objective: Vitals:   05/12/18 2312 05/13/18 0322 05/13/18 0800 05/13/18 1249  BP: 116/61 111/76 108/70 109/69  Pulse: 91 90 98 95  Resp: 18 18 18 18   Temp: 98.2 F (36.8 C) 98 F (36.7 C) 97.8 F (36.6 C) 97.6 F (36.4 C)  TempSrc: Oral Oral Oral Oral  SpO2: 96% 98% 98% 96%  Weight:      Height:        Intake/Output Summary (Last 24  hours) at 05/13/2018 1333 Last data filed at 05/13/2018 1152 Gross per 24 hour  Intake 1285.87 ml  Output 2400 ml  Net -1114.13 ml   Filed Weights   05/07/18 0307  Weight: 118.4 kg    Examination:  General exam: Appears calm and comfortable  Chronically sick looking.  Obese.  Not in any distress.  She is on  room air. Respiratory system: Clear to auscultation. Respiratory effort normal. Cardiovascular system: S1 & S2 heard, RRR. No JVD, murmurs, rubs, gallops or clicks. No pedal edema. Gastrointestinal system: Abdomen is nondistended, soft and nontender. No organomegaly or masses felt. Normal bowel sounds heard.  Obese and pendulous. Central nervous system: Alert and oriented. No focal neurological deficits. Extremities: Symmetric 5 x 5 power. Skin: No rashes, lesions or ulcers Psychiatry: Judgement and insight appear normal. Mood & affect appropriate.     Data Reviewed: I have personally reviewed following labs and imaging studies  CBC: Recent Labs  Lab 05/06/18 1300 05/08/18 0514  05/09/18 2049 05/10/18 0355 05/10/18 0358 05/11/18 0500 05/12/18 0428  WBC 11.9* 8.1  --  11.6*  --  11.9* 9.2 8.6  NEUTROABS 9.4*  --   --   --   --   --   --   --   HGB 12.8 11.2*   < > 11.2* 11.9* 11.3* 10.3* 9.9*  HCT 42.6 38.6   < > 37.8 35.0* 39.1 34.8* 35.1*  MCV 70.6* 72.8*  --  71.1*  --  72.1* 72.5* 74.2*  PLT 324 257  --  371  --  358 236 259   < > = values in this interval not displayed.   Basic Metabolic Panel: Recent Labs  Lab 05/09/18 2049 05/10/18 0355 05/10/18 0358 05/10/18 1240 05/10/18 1714 05/11/18 0500 05/12/18 0428 05/13/18 0402  NA 136 139 139  --   --  137 138 134*  K 3.8 3.7 3.8  --   --  4.0 4.5 3.8  CL 109  --  112*  --   --  108 105 101  CO2 19*  --  20*  --   --  21* 28 25  GLUCOSE 171*  --  173*  --   --  175* 118* 197*  BUN 9  --  8  --   --  10 9 18   CREATININE 1.49*  --  1.47*  --   --  1.64* 1.49* 1.45*  CALCIUM 8.9  --  8.1*  --   --  8.7* 9.1 8.8*  MG 2.0  --  2.1 2.2 2.3 2.1  --   --   PHOS  --   --   --  3.9 4.0 4.5  --   --    GFR: Estimated Creatinine Clearance: 61.1 mL/min (A) (by C-G formula based on SCr of 1.45 mg/dL (H)). Liver Function Tests: No results for input(s): AST, ALT, ALKPHOS, BILITOT, PROT, ALBUMIN in the last 168 hours. No results  for input(s): LIPASE, AMYLASE in the last 168 hours. No results for input(s): AMMONIA in the last 168 hours. Coagulation Profile: No results for input(s): INR, PROTIME in the last 168 hours. Cardiac Enzymes: No results for input(s): CKTOTAL, CKMB, CKMBINDEX, TROPONINI in the last 168 hours. BNP (last 3 results) No results for input(s): PROBNP in the last 8760 hours. HbA1C: No results for input(s): HGBA1C in the last 72 hours. CBG: Recent Labs  Lab 05/12/18 1934 05/12/18 2312 05/13/18 0348 05/13/18 1007 05/13/18 1248  GLUCAP 119* 117*  201* 173* 128*   Lipid Profile: No results for input(s): CHOL, HDL, LDLCALC, TRIG, CHOLHDL, LDLDIRECT in the last 72 hours. Thyroid Function Tests: No results for input(s): TSH, T4TOTAL, FREET4, T3FREE, THYROIDAB in the last 72 hours. Anemia Panel: No results for input(s): VITAMINB12, FOLATE, FERRITIN, TIBC, IRON, RETICCTPCT in the last 72 hours. Sepsis Labs: No results for input(s): PROCALCITON, LATICACIDVEN in the last 168 hours.  Recent Results (from the past 240 hour(s))  Culture, blood (Routine X 2) w Reflex to ID Panel     Status: None   Collection Time: 05/07/18  1:40 AM  Result Value Ref Range Status   Specimen Description   Final    BLOOD RIGHT ANTECUBITAL Performed at [facility] [address] [place], [place] [address]    Special Requests   Final    BOTTLES DRAWN AEROBIC AND ANAEROBIC Blood Culture adequate volume Performed at [facility] [address] [place], [place] [address]    Culture   Final    NO GROWTH 5 DAYS Performed at [facility], [place] [address], [place], [place] [address]    Report Status 05/12/2018 FINAL  Final  Culture, blood (Routine X 2) w Reflex to ID Panel     Status: None   Collection Time: 05/07/18  3:31 AM  Result Value Ref Range Status   Specimen Description   Final    BLOOD LEFT HAND Performed at [facility], [place] [address] [place],  [place] [address]    Special Requests   Final    BOTTLES DRAWN AEROBIC AND ANAEROBIC Blood Culture adequate volume Performed at [facility] [address] [place], [place] [address]    Culture   Final    NO GROWTH 5 DAYS Performed at [facility], [place] [address], [place], [place] [address]    Report Status 05/12/2018 FINAL  Final  MRSA PCR Screening     Status: None   Collection Time: 05/09/18 12:45 PM  Result Value Ref Range Status   MRSA by PCR NEGATIVE NEGATIVE Final    Comment:        The GeneXpert MRSA Assay (FDA approved for NASAL specimens only), is one component of a comprehensive MRSA colonization surveillance program. It is not intended to diagnose MRSA infection nor to guide or monitor treatment for MRSA infections. Performed at [facility], [place] [address], [place], [place] [address]          Radiology Studies: Dg Chest Port 1 View  Result Date: 05/12/2018 CLINICAL DATA:  Shortness of breath EXAM: PORTABLE CHEST 1 VIEW COMPARISON:  May 09, 2018 FINDINGS: Endotracheal tube no longer apparent. Central catheter has been removed as well. Feeding tube tip is below the diaphragm. No pneumothorax. There is patchy atelectatic change in both lower lobes. Lungs elsewhere clear. Heart is upper normal in size with pulmonary vascularity normal. No adenopathy. No bone lesions. IMPRESSION: Feeding tube tip below diaphragm. No pneumothorax. Lower lobe atelectatic change bilaterally. A degree of superimposed pneumonia in these areas cannot be excluded entirely. Lungs elsewhere clear. Stable cardiac silhouette. Electronically Signed   By: Lowella Grip III M.D.   On: 05/12/2018 08:11        Scheduled Meds: . enoxaparin (LOVENOX) injection  60 mg Subcutaneous Daily  . feeding supplement (PRO-STAT SUGAR FREE 64)  30 mL Per Tube TID  . fluticasone  2 spray Each Nare Daily  . insulin aspart  0-9  Units Subcutaneous Q4H  . insulin glargine  10 Units  Subcutaneous Daily  . mouth rinse  15 mL Mouth Rinse 10 times per day  . pregabalin  200 mg Per Tube TID   Continuous Infusions: . sodium chloride Stopped (05/13/18 1029)  . feeding supplement (JEVITY 1.2 CAL) Stopped (05/13/18 1100)  . levETIRAcetam Stopped (05/13/18 1024)     LOS: 6 days    Time spent: 25 minutes    Barb Merino, MD [facility] Pager [phone]  If 7PM-7AM, please contact night-coverage [email or web address] Password [email or web address] 05/13/2018, 1:33 PM

## 2018-05-13 NOTE — Progress Notes (Signed)
Pt back to room from procedure. P. Amo Matas Burrows RN 

## 2018-05-13 NOTE — H&P (Signed)
Physical Medicine and Rehabilitation Admission H&P    Chief Complaint  Patient presents with   Headache   Chief complaint:headache  HPI: Rebecca Johnston is a 48 year old right-handed female with history of diabetes mellitus maintained on Amaryl 2 mg daily, asthma quit smoking 2 years ago, CKDstage III,GERD, morbid obesity with BMI 43.4. Per chart review patient lives with her children ages 44 and 31 (Pt told me 17 and 33). Independent prior to admission still working and drives. She has a mother in the area who can provide intermittent assistance. Presented 05/06/2018 to [facility] with headache, blurred vision and dizziness 1 week. Headache located right temporal area. Patient did note some slurred speech and facial droop as well as occasional nausea with episode of vomiting. Noted recently community-acquired pneumonia 04/29/2018 with persistent cough nasal congestion she was placed on doxycycline by EDP.Marland Kitchen CT/MRI showed no acute stroke or mass effect. CT angiogram of head and neck. Urine drug screen negative. She had elevated glucose on admission of 355 bicarbonate low at 18 hyponatremic 127. She did receive Reglan and Compazine for nausea and later that day noted neck deviation with possible eye deviation as well suspect seizure. She was loaded initially with Dilantin changed to Keppra and Lyrica. EEG showed 2 electrographic seizures emanating out of the right temporal region and spreading mostly to the entire right side. Her Keppra was changed to 1500 mg every 12 hours. Neurology feels seizures caused by severe hyperglycemia. Subcutaneous Lovenox has been added for DVT prophylaxis. Patient  nothing by mouth with nasogastric tube feeds for nutritional support followed by speech therapy until follow-up modified barium swallow 05/13/2018 cleared her for mechanical soft thin liquid diet initiated. Patient did require intubation 05/09/2018 05/11/2018. Bouts of hypotension requiring nor-epi.  Therapy evaluations completed with recommendations of physical medicine rehabilitation consult. Patient was admitted for a comprehensive rehabilitation program.  Review of Systems  Constitutional: Negative for chills and fever.  HENT: Negative for hearing loss and tinnitus.   Eyes: Positive for blurred vision. Negative for photophobia and pain.  Respiratory: Positive for cough.        Shortness of breath with exertion  Cardiovascular: Negative for chest pain, palpitations and leg swelling.  Gastrointestinal: Positive for nausea and vomiting. Negative for abdominal pain, diarrhea and heartburn.       GERD  Genitourinary: Negative for dysuria, hematuria and urgency.  Musculoskeletal: Positive for myalgias.  Skin: Negative for rash.  Neurological: Positive for dizziness, seizures and headaches.  Psychiatric/Behavioral: The patient has insomnia.   All other systems reviewed and are negative.  Past Medical History:  Diagnosis Date   Anemia    Asthma    Diabetes mellitus without complication ([place]) 29/2446   Fibroids    GERD (gastroesophageal reflux disease)    History of blood transfusion Feb 2015   Obesity, Class III, BMI 40-49.9 (morbid obesity) ([place])    Shortness of breath dyspnea    Past Surgical History:  Procedure Laterality Date   ABDOMINAL HYSTERECTOMY N/A 12/08/2014   Procedure: HYSTERECTOMY ABDOMINAL;  Surgeon: Woodroe Mode, MD;  Location: [facility];  Service: Gynecology;  Laterality: N/A;   DILATION AND CURETTAGE OF UTERUS     DILITATION & CURRETTAGE/HYSTROSCOPY WITH VERSAPOINT RESECTION N/A 01/06/2014   Procedure: Corena Pilgrim WITH VERSAPOINT;  Surgeon: Woodroe Mode, MD;  Location: [facility];  Service: Gynecology;  Laterality: N/A;   LAPAROSCOPIC LYSIS OF ADHESIONS N/A 12/08/2014   Procedure: LAPAROSCOPIC LYSIS OF ADHESIONS;  Surgeon: Michael Boston, MD;  Location: [facility];  Service: General;  Laterality: N/A;   OVARIAN CYST REMOVAL     cyst not removed. Cyst drained     SUPRA-UMBILICAL HERNIA N/A 67/04/945   Procedure: LAPARSCOPIC SUPRA-UMBILICAL HERNIA REPAIR ;  Surgeon: Michael Boston, MD;  Location: [facility];  Service: General;  Laterality: N/A;   VENTRAL HERNIA REPAIR N/A 12/08/2014   Procedure: LAPAROSCOPIC INCARCERATED INCISIONAL VENTRAL WALL HERNIA REPAIR;  Surgeon: Michael Boston, MD;  Location: [facility];  Service: General;  Laterality: N/A;   Family History  Problem Relation Age of Onset   Hypertension Mother    Diabetes Father    Breast cancer Maternal Aunt    Diabetes Maternal Aunt    Diabetes Paternal Aunt    Diabetes Maternal Aunt    Diabetes Maternal Aunt    Social History:  reports that she quit smoking about 2 years ago. Her smoking use included cigarettes. She has a 6.25 pack-year smoking history. She has never used smokeless tobacco. She reports current alcohol use. She reports that she does not use drugs. Allergies:  Allergies  Allergen Reactions   Doxycycline     "bad dreaming, hallucination, dizziness" per pt   Medications Prior to Admission  Medication Sig Dispense Refill   aspirin-acetaminophen-caffeine (EXCEDRIN MIGRAINE) 250-250-65 MG tablet Take 2 tablets by mouth every 6 (six) hours as needed for headache.     cetirizine (ZYRTEC) 10 MG tablet Take 1 tablet (10 mg total) by mouth daily. (Patient taking differently: Take 10 mg by mouth daily as needed for allergies. ) 30 tablet 6   doxycycline (VIBRAMYCIN) 100 MG capsule Take 1 capsule (100 mg total) by mouth 2 (two) times daily. 20 capsule 0   fluticasone (FLONASE) 50 MCG/ACT nasal spray USE 2 SPRAY(S) IN EACH NOSTRIL ONCE DAILY (Patient taking differently: Place 2 sprays into both nostrils daily. ) 16 g 0   gabapentin (NEURONTIN) 100 MG capsule Take 100 mg by mouth 1-3 times daily as needed for nerve pains (Patient taking differently: Take 100 mg by mouth 3 (three) times daily as needed (nerve pain). ) 90 capsule 1   glimepiride (AMARYL) 2 MG tablet Take 1 tablet (2  mg total) by mouth daily before breakfast. MUST MAKE APPT FOR FURTHER REFILLS 30 tablet 0   ranitidine (ZANTAC) 300 MG tablet 1 tab PO QHS PRN (Patient taking differently: Take 300 mg by mouth at bedtime as needed for heartburn. ) 30 tablet 6   ACCU-CHEK SOFTCLIX LANCETS lancets 1 each by Other route 2 (two) times daily. 100 each 12   Blood Glucose Monitoring Suppl (ACCU-CHEK AVIVA PLUS) w/Device KIT 1 Device by Does not apply route 2 (two) times daily. 1 kit 0   glucose blood (ACCU-CHEK AVIVA PLUS) test strip 1 each by Other route 2 (two) times daily. MUST MAKE APPT FOR FURTHER REFILLS 100 each 0   mupirocin ointment (BACTROBAN) 2 % Apply 1 application topically 2 (two) times daily. (Patient not taking: Reported on 04/29/2018) 22 g 0   PROAIR HFA 108 (90 Base) MCG/ACT inhaler Inhale 2 puffs into the lungs every 6 (six) hours as needed for wheezing or shortness of breath. (Patient not taking: Reported on 05/06/2018) 1 Inhaler 3    Drug Regimen Review Drug regimen was reviewed and remains appropriate with no significant issues identified  Home: Home Living Family/patient expects to be discharged to:: Private residence Living Arrangements: Children(10 y.o. and 73 y.o.) Available Help at Discharge: Family, Available PRN/intermittently   Functional History: Prior Function Level of  Independence: Independent Comments: works, drives  Functional Status:  Mobility: Bed Mobility Overal bed mobility: Needs Assistance Bed Mobility: Supine to Sit Supine to sit: HOB elevated, Min guard General bed mobility comments: increased time and effort, min guard for safety Transfers Overall transfer level: Needs assistance Equipment used: Rolling walker (2 wheeled) Transfers: Sit to/from Stand, [employer] Transfers Sit to Stand: Min assist, +2 safety/equipment Stand pivot transfers: Min assist, +2 safety/equipment General transfer comment: increased time and effort, cueing for safe hand placement,  some assist needed to power into standing and for stability with transitional movements Ambulation/Gait General Gait Details: pt able to take some steps in place with min A for stability; attempted forward stepping but pt with increasing dizziness and with brief periods of "blank stare" (~4-5 secs x2)    ADL:    Cognition: Cognition Overall Cognitive Status: Impaired/Different from baseline Orientation Level: Oriented X4 Cognition Arousal/Alertness: Awake/alert Behavior During Therapy: Flat affect Overall Cognitive Status: Impaired/Different from baseline Area of Impairment: Attention, Memory, Following commands, Safety/judgement, Awareness, Problem solving Current Attention Level: Sustained Memory: Decreased short-term memory Following Commands: Follows one step commands inconsistently, Follows one step commands with increased time Safety/Judgement: Decreased awareness of safety, Decreased awareness of deficits Awareness: Intellectual Problem Solving: Slow processing, Difficulty sequencing, Requires verbal cues, Requires tactile cues General Comments: Pt impulsive, slow to process information and commands.  When asked if she could safely get back to the bed on her own she said, "yes"  Physical Exam: Blood pressure 123/84, pulse 96, temperature 97.7 F (36.5 C), temperature source Oral, resp. rate 19, height '5\' 5"'  (1.651 m), weight 116.9 kg, last menstrual period 11/26/2014, SpO2 96 %. Physical Exam  Constitutional: No distress.  Obese, lethargic appearing.  HENT:  Head: Normocephalic and atraumatic.  Eyes: Pupils are equal, round, and reactive to light. EOM are normal.  Neck: Normal range of motion. No thyromegaly present.  Cardiovascular: Normal rate and regular rhythm. Exam reveals no friction rub.  No murmur heard. Respiratory: Effort normal. No respiratory distress. She has no wheezes. She has no rales.  GI: Soft. She exhibits no distension. There is no abdominal  tenderness.  Musculoskeletal: Normal range of motion.        General: Edema (BLE) present. No tenderness or deformity.  Neurological:  Pt aroused fairly easily when I arrived. Could tell me she was at [facility] and that she was here for a seizure. New the month. Told me about her children and the 101 yo who is in college. Said her mother was staying with the 43 yo. Moves UE3-4/5 prox to distal. LE: 3/5 prox to 4/5 distally. Sensed pain in all 4's. CN non-focal but didn't consistently participate in CN exam. Confused with impaired insight and awareness. Language normal. Speech slightly slurred   Skin: Skin is dry. She is not diaphoretic.  Psychiatric:  flat    Results for orders placed or performed during the hospital encounter of 05/06/18 (from the past 48 hour(s))  Glucose, capillary     Status: None   Collection Time: 05/12/18  8:33 AM  Result Value Ref Range   Glucose-Capillary 99 70 - 99 mg/dL   Comment 1 Notify RN    Comment 2 Document in Chart   Glucose, capillary     Status: Abnormal   Collection Time: 05/12/18 12:08 PM  Result Value Ref Range   Glucose-Capillary 104 (H) 70 - 99 mg/dL   Comment 1 Notify RN    Comment 2 Document in Chart  Glucose, capillary     Status: Abnormal   Collection Time: 05/12/18  4:31 PM  Result Value Ref Range   Glucose-Capillary 66 (L) 70 - 99 mg/dL  Glucose, capillary     Status: Abnormal   Collection Time: 05/12/18  6:02 PM  Result Value Ref Range   Glucose-Capillary 107 (H) 70 - 99 mg/dL  Glucose, capillary     Status: Abnormal   Collection Time: 05/12/18  7:34 PM  Result Value Ref Range   Glucose-Capillary 119 (H) 70 - 99 mg/dL   Comment 1 Notify RN    Comment 2 Document in Chart   Glucose, capillary     Status: Abnormal   Collection Time: 05/12/18 11:12 PM  Result Value Ref Range   Glucose-Capillary 117 (H) 70 - 99 mg/dL   Comment 1 Notify RN    Comment 2 Document in Chart   Glucose, capillary     Status: Abnormal   Collection  Time: 05/13/18  3:48 AM  Result Value Ref Range   Glucose-Capillary 201 (H) 70 - 99 mg/dL   Comment 1 Notify RN    Comment 2 Document in Chart   Basic metabolic panel     Status: Abnormal   Collection Time: 05/13/18  4:02 AM  Result Value Ref Range   Sodium 134 (L) 135 - 145 mmol/L   Potassium 3.8 3.5 - 5.1 mmol/L   Chloride 101 98 - 111 mmol/L   CO2 25 22 - 32 mmol/L   Glucose, Bld 197 (H) 70 - 99 mg/dL   BUN 18 6 - 20 mg/dL   Creatinine, Ser 1.45 (H) 0.44 - 1.00 mg/dL   Calcium 8.8 (L) 8.9 - 10.3 mg/dL   GFR calc non Af Amer 42 (L) >60 mL/min   GFR calc Af Amer 49 (L) >60 mL/min   Anion gap 8 5 - 15    Comment: Performed at [facility], [place] [address] [place], [place] [address]  Glucose, capillary     Status: Abnormal   Collection Time: 05/13/18 10:07 AM  Result Value Ref Range   Glucose-Capillary 173 (H) 70 - 99 mg/dL  Glucose, capillary     Status: Abnormal   Collection Time: 05/13/18 12:48 PM  Result Value Ref Range   Glucose-Capillary 128 (H) 70 - 99 mg/dL  Glucose, capillary     Status: Abnormal   Collection Time: 05/13/18  4:21 PM  Result Value Ref Range   Glucose-Capillary 113 (H) 70 - 99 mg/dL  Glucose, capillary     Status: Abnormal   Collection Time: 05/13/18  8:51 PM  Result Value Ref Range   Glucose-Capillary 156 (H) 70 - 99 mg/dL  Glucose, capillary     Status: Abnormal   Collection Time: 05/14/18  4:23 AM  Result Value Ref Range   Glucose-Capillary 156 (H) 70 - 99 mg/dL   Comment 1 Notify RN    Dg Swallowing Func-speech Pathology  Result Date: 05/13/2018 Objective Swallowing Evaluation: Type of Study: MBS-Modified Barium Swallow Study  Patient Details Name: BRANDIE LOPES MRN: [id number] Date of Birth: 02/26/1971 Today's Date: 05/13/2018 Time: SLP Start Time (ACUTE ONLY): 1445 -SLP Stop Time (ACUTE ONLY): 1505 SLP Time Calculation (min) (ACUTE ONLY): 20 min Past Medical History: Past Medical History: Diagnosis Date  Anemia   Asthma   Diabetes  mellitus without complication ([place]) 32/1224  Fibroids   GERD (gastroesophageal reflux disease)   History of blood transfusion Feb 2015  Obesity, Class III, BMI 40-49.9 (morbid  obesity) ([place])   Shortness of breath dyspnea  Past Surgical History: Past Surgical History: Procedure Laterality Date  ABDOMINAL HYSTERECTOMY N/A 12/08/2014  Procedure: HYSTERECTOMY ABDOMINAL;  Surgeon: [place]roe Mode, MD;  Location: [facility];  Service: Gynecology;  Laterality: N/A;  DILATION AND CURETTAGE OF UTERUS    DILITATION & CURRETTAGE/HYSTROSCOPY WITH VERSAPOINT RESECTION N/A 01/06/2014  Procedure: Corena Pilgrim WITH VERSAPOINT;  Surgeon: Wood[place] Mode, MD;  Location: [facility];  Service: Gynecology;  Laterality: N/A;  LAPAROSCOPIC LYSIS OF ADHESIONS N/A 12/08/2014  Procedure: LAPAROSCOPIC LYSIS OF ADHESIONS;  Surgeon: Michael Boston, MD;  Location: [facility];  Service: General;  Laterality: N/A;  OVARIAN CYST REMOVAL    cyst not removed. Cyst drained  SUPRA-UMBILICAL HERNIA N/A 70/03/4101  Procedure: LAPARSCOPIC SUPRA-UMBILICAL HERNIA REPAIR ;  Surgeon: Michael Boston, MD;  [facility]: [facility];  Service: General;  Laterality: N/A;  VENTRAL HERNIA REPAIR N/A 12/08/2014  Procedure: LAPAROSCOPIC INCARCERATED INCISIONAL VENTRAL WALL HERNIA REPAIR;  Surgeon: Michael Boston, MD;  Location: [facility];  Service: General;  Laterality: N/A; HPI: Pt is a 48 y.o. female with medical history significant of diabetes mellitus, asthma, cardiac, obesity, CKD stage III, who presented with headache, blurry vision, dizziness. Patient stated that she has been having headaches for more than 1 week in the right temporal area, constant, 8 out of 10 severity, nonradiating. She also reported right leg weakness, slurred speech, and facial droop. CT of the head and MRI were negative for acute pathology. Pt was found obtunded only responsive to pain on 3/8 and was intubated from 05/09/18 to 05/11/18.  EEG 3/6 right temporal complex partial seizure. EEG 3/10 showed evidence of an  improving diffuse encephalopathy. Chest x-ray of 05/11/18 revealed lower lobe atelectatic change bilaterally but a degree of superimposed pneumonia in these areas could not be excluded entirely.  No data recorded Assessment / Plan / Recommendation CHL IP CLINICAL IMPRESSIONS 05/13/2018 Clinical Impression Pt presents with oropharyngeal dysphagia characterized by impaired A-P transport, impaired bolus control, a pharyngeal delay, and reduced laryngeal excursion. She demonstrated lingual pumping, premature spillage to the valleculae, and penetration (PAS 3) of thin liquids when consecutive swallows were used. Penetration was eliminated with use of individual sips and no pharyngeal residue or instances of aspiration were demontrated. It is recommended that a dysphagia 3 diet with thin liquids be initiated at this time with obervance of swallowing precautions. SLP will follow to ensure tolerance of the recommended diet and for potential upgrade to regular consistency. SLP Visit Diagnosis Dysphagia, oropharyngeal phase (R13.12) Attention and concentration deficit following -- Frontal lobe and executive function deficit following -- Impact on safety and function Mild aspiration risk   CHL IP TREATMENT RECOMMENDATION 05/13/2018 Treatment Recommendations Therapy as outlined in treatment plan below   Prognosis 05/13/2018 Prognosis for Safe Diet Advancement Good Barriers to Reach Goals Cognitive deficits Barriers/Prognosis Comment -- CHL IP DIET RECOMMENDATION 05/13/2018 SLP Diet Recommendations Dysphagia 3 (Mech soft) solids;Thin liquid Liquid Administration via Straw;Cup Medication Administration Whole meds with liquid Compensations Slow rate;Small sips/bites;Other (Comment) Postural Changes Seated upright at 90 degrees   CHL IP OTHER RECOMMENDATIONS 05/13/2018 Recommended Consults -- Oral Care Recommendations Oral care BID Other Recommendations --   CHL IP FOLLOW UP RECOMMENDATIONS 05/13/2018 Follow up Recommendations Inpatient  Rehab   CHL IP FREQUENCY AND DURATION 05/13/2018 Speech Therapy Frequency (ACUTE ONLY) min 2x/week Treatment Duration 2 weeks      CHL IP ORAL PHASE 05/13/2018 Oral Phase Impaired Oral - Pudding Teaspoon -- Oral - Pudding Cup -- Oral -  Honey Teaspoon -- Oral - Honey Cup -- Oral - Nectar Teaspoon -- Oral - Nectar Cup -- Oral - Nectar Straw -- Oral - Thin Teaspoon -- Oral - Thin Cup Lingual pumping;Premature spillage Oral - Thin Straw Lingual pumping;Premature spillage Oral - Puree Lingual pumping;Premature spillage Oral - Mech Soft Lingual pumping Oral - Regular Lingual pumping Oral - Multi-Consistency -- Oral - Pill Lingual pumping;Premature spillage Oral Phase - Comment --  CHL IP PHARYNGEAL PHASE 05/13/2018 Pharyngeal Phase Impaired Pharyngeal- Pudding Teaspoon -- Pharyngeal -- Pharyngeal- Pudding Cup -- Pharyngeal -- Pharyngeal- Honey Teaspoon -- Pharyngeal -- Pharyngeal- Honey Cup -- Pharyngeal -- Pharyngeal- Nectar Teaspoon -- Pharyngeal -- Pharyngeal- Nectar Cup -- Pharyngeal -- Pharyngeal- Nectar Straw -- Pharyngeal -- Pharyngeal- Thin Teaspoon -- Pharyngeal -- Pharyngeal- Thin Cup Delayed swallow initiation-vallecula;Reduced anterior laryngeal mobility Pharyngeal -- Pharyngeal- Thin Straw Delayed swallow initiation-vallecula;Reduced anterior laryngeal mobility;Penetration/Aspiration during swallow;Penetration/Aspiration before swallow Pharyngeal Material enters airway, remains ABOVE vocal cords and not ejected out Pharyngeal- Puree Delayed swallow initiation-vallecula;Reduced anterior laryngeal mobility Pharyngeal -- Pharyngeal- Mechanical Soft Delayed swallow initiation-vallecula;Reduced anterior laryngeal mobility Pharyngeal -- Pharyngeal- Regular Delayed swallow initiation-vallecula;Reduced anterior laryngeal mobility Pharyngeal -- Pharyngeal- Multi-consistency -- Pharyngeal -- Pharyngeal- Pill Delayed swallow initiation-vallecula;Reduced anterior laryngeal mobility Pharyngeal -- Pharyngeal Comment --  CHL  IP CERVICAL ESOPHAGEAL PHASE 05/13/2018 Cervical Esophageal Phase WFL Pudding Teaspoon -- Pudding Cup -- Honey Teaspoon -- Honey Cup -- Nectar Teaspoon -- Nectar Cup -- Nectar Straw -- Thin Teaspoon -- Thin Cup -- Thin Straw -- Puree -- Mechanical Soft -- Regular -- Multi-consistency -- Pill -- Cervical Esophageal Comment -- Shanika I. Hardin Negus, [place], [place] Office number [phone] Pager [place] 05/13/2018, 3:45 PM                  Medical Problem List and Plan: 1.  Decreased functional mobility with headache dizziness secondary to refractory focal status epilepticus possibly precipitated by severe hyperglycemia. Additionally debilitated from multiple medical issues and prolonged hospital stay  -admit to inpatient rehab  -start sleep chart 2.  Antithrombotics: -DVT/anticoagulation:  Subcutaneous Lovenox. Monitor for bleeding episodes  -antiplatelet therapy: none 3. Pain Management:  Tylenol as needed 4. Mood:  provide emotional support  -antipsychotic agents:  none 5. Neuropsych: This patient is capable of making decisions on her own behalf. 6. Skin/Wound Care:  Routine skin checks 7. Fluids/Electrolytes/Nutrition:  Routine ins and outs with follow-up chemistries 8. Seizure disorder. Keppra 1500 mg twice a day, Lyrica 200 mg 3 times a day. Pt off phenobarbital and dilantin  - Follow-up neurology services as outpt  -monitor closely for further seizure activity  -keep HOB at 30 degrees 9.Diabetes mellitus with peripheral neuropathy. Hemoglobin A1c 14.9. Lantus insulin 10 units daily. Check blood sugars before meals and at bedtime. Diabetic teaching 10. Dysphagia. Diet advanced to mechanical soft. 11. Asthma with remote history of tobacco abuse. Continue nebulizers 12. Morbid obesity. BMI 43.44. Dietary follow-up 13.CKD stage III. Follow-up chemistries     Cathlyn Parsons, PA-C 05/14/2018

## 2018-05-13 NOTE — Progress Notes (Signed)
Inpatient Rehab Admissions:  Inpatient Rehab Consult received.  I met with patient at the bedside for rehabilitation assessment and to discuss goals and expectations of an inpatient rehab admission.  Patient is interested in CIR with hopes to return home and go back to work after rehab.  Noted patient to have MBS done today.  Will follow for medical readiness.  SignedKarene Fry, [place], [place]

## 2018-05-13 NOTE — Progress Notes (Signed)
Physical Therapy Treatment Patient Details Name: Rebecca Johnston MRN: [id number] DOB: [birth year] Today's Date: 05/13/2018    History of Present Illness Pt is a 48 y.o. female admitted on 05/08/28 for HA, blurry vision, dizziness.  MRI is negative for stroke.  EEG (+) for R temporal seizures.  Inutbated for airway protection 3/8-3/10/20.  Hypotensive requiring nor-epi.  CT and follow up CT are negative.  Pt with other significant PMH of DM, asthma, and anemia.    PT Comments    Pt making progress towards achieving her current functional mobility goals. She required less physical assistance with bed mobility and transfers this session. Attempted ambulation but limited secondary to pt with increasing dizziness and brief periods of "blank stare" with delayed response to auditory stimuli. She was able to take several steps in place and pivot to the chair with min A and RW.  Pt would continue to benefit from skilled physical therapy services at this time while admitted and after d/c to address the below listed limitations in order to improve overall safety and independence with functional mobility.   Follow Up Recommendations  CIR     Equipment Recommendations  Rolling walker with 5" wheels    Recommendations for Other Services       Precautions / Restrictions Precautions Precautions: Fall Restrictions Weight Bearing Restrictions: No    Mobility  Bed Mobility Overal bed mobility: Needs Assistance Bed Mobility: Supine to Sit     Supine to sit: HOB elevated;Min guard     General bed mobility comments: increased time and effort, min guard for safety  Transfers Overall transfer level: Needs assistance Equipment used: Rolling walker (2 wheeled) Transfers: Sit to/from [facility] Sit to Stand: Min assist;+2 safety/equipment Stand pivot transfers: Min assist;+2 safety/equipment       General transfer comment: increased time and effort, cueing for safe hand  placement, some assist needed to power into standing and for stability with transitional movements  Ambulation/Gait             General Gait Details: pt able to take some steps in place with min A for stability; attempted forward stepping but pt with increasing dizziness and with brief periods of "blank stare" (~4-5 secs x2)   Stairs             Wheelchair Mobility    Modified Rankin (Stroke Patients Only)       Balance Overall balance assessment: Needs assistance Sitting-balance support: Feet supported;Bilateral upper extremity supported Sitting balance-Leahy Scale: Poor     Standing balance support: Bilateral upper extremity supported Standing balance-Leahy Scale: Poor                              Cognition Arousal/Alertness: Awake/alert Behavior During Therapy: Flat affect Overall Cognitive Status: Impaired/Different from baseline Area of Impairment: Attention;Memory;Following commands;Safety/judgement;Awareness;Problem solving                   Current Attention Level: Sustained Memory: Decreased short-term memory Following Commands: Follows one step commands inconsistently;Follows one step commands with increased time Safety/Judgement: Decreased awareness of safety;Decreased awareness of deficits Awareness: Intellectual Problem Solving: Slow processing;Difficulty sequencing;Requires verbal cues;Requires tactile cues        Exercises      General Comments        Pertinent Vitals/Pain Pain Assessment: No/denies pain    Home Living  Prior Function            PT Goals (current goals can now be found in the care plan section) Acute Rehab PT Goals PT Goal Formulation: Patient unable to participate in goal setting Time For Goal Achievement: 05/26/18 Potential to Achieve Goals: Good Progress towards PT goals: Progressing toward goals    Frequency    Min 3X/week      PT Plan Current plan  remains appropriate    Co-evaluation              AM-PAC PT "6 Clicks" Mobility   Outcome Measure  Help needed turning from your back to your side while in a flat bed without using bedrails?: A Little Help needed moving from lying on your back to sitting on the side of a flat bed without using bedrails?: A Lot Help needed moving to and from a bed to a chair (including a wheelchair)?: A Little Help needed standing up from a chair using your arms (e.g., wheelchair or bedside chair)?: A Little Help needed to walk in hospital room?: A Lot Help needed climbing 3-5 steps with a railing? : Total 6 Click Score: 14    End of Session Equipment Utilized During Treatment: Gait belt Activity Tolerance: Patient tolerated treatment well Patient left: in chair;with call bell/phone within reach;with chair alarm set Nurse Communication: Mobility status PT Visit Diagnosis: Muscle weakness (generalized) (M62.81);Difficulty in walking, not elsewhere classified (R26.2);Ataxic gait (R26.0);Unsteadiness on feet (R26.81);Other symptoms and signs involving the nervous system (R29.898)     Time: 2841-3244 PT Time Calculation (min) (ACUTE ONLY): 21 min  Charges:  $Therapeutic Activity: 8-22 mins                     Sherie Don, [place], DPT  Acute Rehabilitation Services Pager [phone] Office [place] 05/13/2018, 1:48 PM

## 2018-05-13 NOTE — Progress Notes (Signed)
  Speech Language Pathology Treatment: Dysphagia  Patient Details Name: Rebecca Johnston MRN: [id number] DOB: 05-21-1970 Today's Date: 05/13/2018 Time: 0950-1010 SLP Time Calculation (min) (ACUTE ONLY): 20 min  Assessment / Plan / Recommendation Clinical Impression  Pt participated in dysphagia treatment and demonstrated some improvement in vocal quality. Her mentation also appeared improved and she was noted to ask staff relevant questions about her care such as when her seizure medication would be discontinued and what her blood sugar was. She tolerated ice chips, thin liquids via tsp, puree solids, and regular texture solids without overt s/sx of aspiration. Two instances of coughing were noted with approximately 8oz of thin liquids which suggests an improvement in swallow function but is still concerning for possible aspiration. It is therefore recommended that a modified barium swallow study be conducted to further assess swallow function and it is currently scheduled for 1400.    HPI HPI: Pt is a 48 y.o. female with medical history significant of diabetes mellitus, asthma, cardiac, obesity, CKD stage III, who presented with headache, blurry vision, dizziness. Patient stated that she has been having headaches for more than 1 week in the right temporal area, constant, 8 out of 10 severity, nonradiating. She also reported right leg weakness, slurred speech, and facial droop. CT of the head and MRI were negative for acute pathology. Pt was found obtunded only responsive to pain on 3/8 and was intubated from 05/09/18 to 05/11/18.  EEG 3/6 right temporal complex partial seizure. EEG 3/10 showed evidence of an improving diffuse encephalopathy. Chest x-ray of 05/11/18 revealed lower lobe atelectatic change bilaterally but a degree of superimposed pneumonia in these areas could not be excluded entirely.       SLP Plan  MBS       Recommendations  Diet recommendations: NPO Medication Administration: Via  alternative means                Oral Care Recommendations: Oral care QID Follow up Recommendations: Inpatient Rehab SLP Visit Diagnosis: Dysphagia, oropharyngeal phase (R13.12) Plan: MBS       Odyn Turko I. Hardin Negus, [place], [place] Office number [phone] Pager [place] 05/13/2018, 10:57 AM

## 2018-05-13 NOTE — Progress Notes (Signed)
OT Cancellation Note  Patient Details Name: Rebecca Johnston MRN: [id number] DOB: 1971-01-28   Cancelled Treatment:    Reason Eval/Treat Not Completed: Patient at procedure or test/ unavailable;Other (comment)(off floor for swallow study). Will follow up as able.  Gypsy Decant, MS, OTR/L 05/13/2018, 2:28 PM

## 2018-05-13 NOTE — Progress Notes (Signed)
Pt transported off unit for barium swallow. Delia Heady RN

## 2018-05-13 NOTE — Progress Notes (Signed)
Modified Barium Swallow Progress Note  Patient Details  Name: Rebecca Johnston MRN: [id number] Date of Birth: June 16, 1970  Today's Date: 05/13/2018  Modified Barium Swallow completed.  Full report located under Chart Review in the Imaging Section.  Brief recommendations include the following:  Clinical Impression  Pt presents with oropharyngeal dysphagia characterized by impaired A-P transport, impaired bolus control, a pharyngeal delay, and reduced laryngeal excursion. She demonstrated lingual pumping, premature spillage to the valleculae, and penetration (PAS 3) of thin liquids when consecutive swallows were used. Penetration was eliminated with use of individual sips and no pharyngeal residue or instances of aspiration were demontrated. It is recommended that a dysphagia 3 diet with thin liquids be initiated at this time with obervance of swallowing precautions. SLP will follow to ensure tolerance of the recommended diet and for potential upgrade to regular consistency.     Swallow Evaluation Recommendations       SLP Diet Recommendations: Dysphagia 3 (Mech soft) solids;Thin liquid   Liquid Administration via: Straw;Cup   Medication Administration: Whole meds with liquid   Supervision: Staff to assist with self feeding   Compensations: Slow rate;Small sips/bites;Other (Comment)(Individual sips of thin liquids. )   Postural Changes: Seated upright at 90 degrees   Oral Care Recommendations: Oral care BID      [name] I. Hardin Negus, [place], [place] Office number [phone] Pager [place] 05/13/2018,3:42 PM

## 2018-05-14 ENCOUNTER — Encounter (HOSPITAL_COMMUNITY): Payer: Self-pay

## 2018-05-14 ENCOUNTER — Other Ambulatory Visit: Payer: Self-pay

## 2018-05-14 ENCOUNTER — Inpatient Hospital Stay (HOSPITAL_COMMUNITY)
Admission: RE | Admit: 2018-05-14 | Discharge: 2018-05-26 | DRG: 945 | Disposition: A | Discharge status: Home Health | Payer: Medicaid Other | Source: Intra-hospital | Attending: Physical Medicine & Rehabilitation | Admitting: Physical Medicine & Rehabilitation

## 2018-05-14 DIAGNOSIS — J45909 Unspecified asthma, uncomplicated: Secondary | ICD-10-CM | POA: Diagnosis present

## 2018-05-14 DIAGNOSIS — Z7982 Long term (current) use of aspirin: Secondary | ICD-10-CM | POA: Diagnosis not present

## 2018-05-14 DIAGNOSIS — Z87891 Personal history of nicotine dependence: Secondary | ICD-10-CM

## 2018-05-14 DIAGNOSIS — Z881 Allergy status to other antibiotic agents status: Secondary | ICD-10-CM | POA: Diagnosis not present

## 2018-05-14 DIAGNOSIS — E1122 Type 2 diabetes mellitus with diabetic chronic kidney disease: Secondary | ICD-10-CM | POA: Diagnosis present

## 2018-05-14 DIAGNOSIS — Z833 Family history of diabetes mellitus: Secondary | ICD-10-CM

## 2018-05-14 DIAGNOSIS — Z6841 Body Mass Index (BMI) 40.0 and over, adult: Secondary | ICD-10-CM

## 2018-05-14 DIAGNOSIS — G9349 Other encephalopathy: Secondary | ICD-10-CM | POA: Diagnosis present

## 2018-05-14 DIAGNOSIS — Z8249 Family history of ischemic heart disease and other diseases of the circulatory system: Secondary | ICD-10-CM

## 2018-05-14 DIAGNOSIS — G40901 Epilepsy, unspecified, not intractable, with status epilepticus: Secondary | ICD-10-CM | POA: Diagnosis not present

## 2018-05-14 DIAGNOSIS — N183 Chronic kidney disease, stage 3 (moderate): Secondary | ICD-10-CM | POA: Diagnosis not present

## 2018-05-14 DIAGNOSIS — G8384 Todd's paralysis (postepileptic): Secondary | ICD-10-CM | POA: Diagnosis present

## 2018-05-14 DIAGNOSIS — R29810 Facial weakness: Secondary | ICD-10-CM | POA: Diagnosis present

## 2018-05-14 DIAGNOSIS — Z803 Family history of malignant neoplasm of breast: Secondary | ICD-10-CM | POA: Diagnosis not present

## 2018-05-14 DIAGNOSIS — R1312 Dysphagia, oropharyngeal phase: Secondary | ICD-10-CM | POA: Diagnosis present

## 2018-05-14 DIAGNOSIS — E871 Hypo-osmolality and hyponatremia: Secondary | ICD-10-CM | POA: Diagnosis present

## 2018-05-14 DIAGNOSIS — K219 Gastro-esophageal reflux disease without esophagitis: Secondary | ICD-10-CM | POA: Diagnosis present

## 2018-05-14 DIAGNOSIS — G9341 Metabolic encephalopathy: Secondary | ICD-10-CM

## 2018-05-14 DIAGNOSIS — Z90710 Acquired absence of both cervix and uterus: Secondary | ICD-10-CM

## 2018-05-14 DIAGNOSIS — E11649 Type 2 diabetes mellitus with hypoglycemia without coma: Secondary | ICD-10-CM | POA: Diagnosis not present

## 2018-05-14 DIAGNOSIS — G40909 Epilepsy, unspecified, not intractable, without status epilepticus: Secondary | ICD-10-CM | POA: Diagnosis not present

## 2018-05-14 DIAGNOSIS — T383X5A Adverse effect of insulin and oral hypoglycemic [antidiabetic] drugs, initial encounter: Secondary | ICD-10-CM | POA: Diagnosis not present

## 2018-05-14 DIAGNOSIS — R51 Headache: Secondary | ICD-10-CM | POA: Diagnosis present

## 2018-05-14 DIAGNOSIS — Y92239 Unspecified place in hospital as the place of occurrence of the external cause: Secondary | ICD-10-CM | POA: Diagnosis not present

## 2018-05-14 DIAGNOSIS — R471 Dysarthria and anarthria: Secondary | ICD-10-CM | POA: Diagnosis present

## 2018-05-14 DIAGNOSIS — E1165 Type 2 diabetes mellitus with hyperglycemia: Secondary | ICD-10-CM | POA: Diagnosis present

## 2018-05-14 DIAGNOSIS — E1142 Type 2 diabetes mellitus with diabetic polyneuropathy: Secondary | ICD-10-CM | POA: Diagnosis not present

## 2018-05-14 DIAGNOSIS — Z7984 Long term (current) use of oral hypoglycemic drugs: Secondary | ICD-10-CM | POA: Diagnosis not present

## 2018-05-14 DIAGNOSIS — G40209 Localization-related (focal) (partial) symptomatic epilepsy and epileptic syndromes with complex partial seizures, not intractable, without status epilepticus: Secondary | ICD-10-CM | POA: Diagnosis present

## 2018-05-14 DIAGNOSIS — Z79899 Other long term (current) drug therapy: Secondary | ICD-10-CM | POA: Diagnosis not present

## 2018-05-14 DIAGNOSIS — R5381 Other malaise: Principal | ICD-10-CM | POA: Diagnosis present

## 2018-05-14 DIAGNOSIS — K5900 Constipation, unspecified: Secondary | ICD-10-CM | POA: Diagnosis not present

## 2018-05-14 DIAGNOSIS — G4700 Insomnia, unspecified: Secondary | ICD-10-CM | POA: Diagnosis not present

## 2018-05-14 DIAGNOSIS — T43015A Adverse effect of tricyclic antidepressants, initial encounter: Secondary | ICD-10-CM | POA: Diagnosis not present

## 2018-05-14 DIAGNOSIS — R4781 Slurred speech: Secondary | ICD-10-CM | POA: Diagnosis present

## 2018-05-14 HISTORY — DX: Epilepsy, unspecified, not intractable, without status epilepticus: G40.909

## 2018-05-14 LAB — CREATININE, SERUM
CREATININE: 1.52 mg/dL — AB (ref 0.44–1.00)
GFR calc Af Amer: 47 mL/min — ABNORMAL LOW (ref >60)
GFR calc non Af Amer: 40 mL/min — ABNORMAL LOW (ref >60)

## 2018-05-14 LAB — GLUCOSE, CAPILLARY
Glucose-Capillary: 139 mg/dL — ABNORMAL HIGH (ref 70–99)
Glucose-Capillary: 156 mg/dL — ABNORMAL HIGH (ref 70–99)
Glucose-Capillary: 166 mg/dL — ABNORMAL HIGH (ref 70–99)
Glucose-Capillary: 128 mg/dL — ABNORMAL HIGH (ref 70–99)
Glucose-Capillary: 197 mg/dL — ABNORMAL HIGH (ref 70–99)

## 2018-05-14 LAB — CBC
HCT: 36 % (ref 36.0–46.0)
Hemoglobin: 10.5 g/dL — ABNORMAL LOW (ref 12.0–15.0)
MCH: 21.1 pg — ABNORMAL LOW (ref 26.0–34.0)
MCHC: 29.2 g/dL — ABNORMAL LOW (ref 30.0–36.0)
MCV: 72.4 fL — ABNORMAL LOW (ref 80.0–100.0)
NRBC: 0 % (ref 0.0–0.2)
Platelets: 292 10*3/uL (ref 150–400)
RBC: 4.97 MIL/uL (ref 3.87–5.11)
RDW: 17.1 % — ABNORMAL HIGH (ref 11.5–15.5)
WBC: 8.7 10*3/uL (ref 4.0–10.5)

## 2018-05-14 MED ORDER — ENOXAPARIN SODIUM 60 MG/0.6ML ~~LOC~~ SOLN: 60.0000 mg | SUBCUTANEOUS | Status: DC

## 2018-05-14 MED ORDER — LORAZEPAM 2 MG/ML IJ SOLN: 1.0000 mg | INTRAMUSCULAR | Status: DC | PRN

## 2018-05-14 MED ORDER — ALBUTEROL (5 MG/ML) CONTINUOUS INHALATION SOLN
2.5000 mg | INHALATION_SOLUTION | RESPIRATORY_TRACT | Status: DC | PRN
  Filled 2018-05-14 (×2): qty 20

## 2018-05-14 MED ORDER — INSULIN ASPART 100 UNIT/ML ~~LOC~~ SOLN
0.0000 [IU] | Freq: Three times a day (TID) | SUBCUTANEOUS | Status: DC
  Administered 2018-05-15: 1 [IU] via SUBCUTANEOUS
  Administered 2018-05-15: 2 [IU] via SUBCUTANEOUS
  Administered 2018-05-15: 1 [IU] via SUBCUTANEOUS
  Administered 2018-05-16: 3 [IU] via SUBCUTANEOUS
  Administered 2018-05-16: 2 [IU] via SUBCUTANEOUS

## 2018-05-14 MED ORDER — INSULIN GLARGINE 100 UNIT/ML ~~LOC~~ SOLN
10.0000 [IU] | Freq: Every day | SUBCUTANEOUS | Status: DC
  Administered 2018-05-14 – 2018-05-18 (×5): 10 [IU] via SUBCUTANEOUS
  Filled 2018-05-14 (×5): qty 0.1

## 2018-05-14 MED ORDER — LEVETIRACETAM 100 MG/ML PO SOLN
1500.0000 mg | Freq: Two times a day (BID) | ORAL | Status: DC
  Administered 2018-05-14 – 2018-05-16 (×4): 1500 mg via ORAL
  Filled 2018-05-14 (×4): qty 15

## 2018-05-14 MED ORDER — ENOXAPARIN SODIUM 60 MG/0.6ML ~~LOC~~ SOLN
60.0000 mg | SUBCUTANEOUS | Status: DC
  Administered 2018-05-15 – 2018-05-24 (×10): 60 mg via SUBCUTANEOUS
  Filled 2018-05-14 (×10): qty 0.6

## 2018-05-14 MED ORDER — ENOXAPARIN SODIUM 60 MG/0.6ML ~~LOC~~ SOLN: 60.0000 mg | Freq: Every day | SUBCUTANEOUS | Status: DC

## 2018-05-14 MED ORDER — ORAL CARE MOUTH RINSE
15.0000 mL | Freq: Two times a day (BID) | OROMUCOSAL | Status: DC
  Administered 2018-05-14 (×2): 15 mL via OROMUCOSAL

## 2018-05-14 MED ORDER — PREGABALIN 75 MG PO CAPS
200.0000 mg | ORAL_CAPSULE | Freq: Three times a day (TID) | ORAL | Status: DC
  Administered 2018-05-14 – 2018-05-17 (×8): 200 mg via ORAL
  Filled 2018-05-14 (×3): qty 2
  Filled 2018-05-14: qty 1
  Filled 2018-05-14: qty 2
  Filled 2018-05-14: qty 1
  Filled 2018-05-14 (×2): qty 2

## 2018-05-14 MED ORDER — FLUTICASONE PROPIONATE 50 MCG/ACT NA SUSP
2.0000 | Freq: Every day | NASAL | Status: DC
  Administered 2018-05-15 – 2018-05-26 (×12): 2 via NASAL
  Filled 2018-05-14: qty 16

## 2018-05-14 MED ORDER — ALBUTEROL SULFATE (2.5 MG/3ML) 0.083% IN NEBU: 2.5000 mg | INHALATION_SOLUTION | RESPIRATORY_TRACT | Status: DC | PRN

## 2018-05-14 MED ORDER — INSULIN ASPART 100 UNIT/ML ~~LOC~~ SOLN: 0.0000 [IU] | SUBCUTANEOUS | Status: DC

## 2018-05-14 MED ORDER — ACETAMINOPHEN 325 MG PO TABS
650.0000 mg | ORAL_TABLET | Freq: Four times a day (QID) | ORAL | Status: DC | PRN
  Administered 2018-05-15 – 2018-05-25 (×16): 650 mg via ORAL
  Filled 2018-05-14 (×18): qty 2

## 2018-05-14 MED ORDER — LEVETIRACETAM 500 MG PO TABS: 1000.0000 mg | ORAL_TABLET | Freq: Two times a day (BID) | ORAL | Status: DC

## 2018-05-14 MED ORDER — ACETAMINOPHEN 650 MG RE SUPP: 650.0000 mg | Freq: Four times a day (QID) | RECTAL | Status: DC | PRN

## 2018-05-14 MED ORDER — INSULIN GLARGINE 100 UNIT/ML ~~LOC~~ SOLN: 10.0000 [IU] | Freq: Every day | SUBCUTANEOUS | Status: DC

## 2018-05-14 NOTE — Progress Notes (Signed)
Pt discharge to CIR and report called off to nurse Kayla on 4w. Delia Heady RN

## 2018-05-14 NOTE — Progress Notes (Signed)
Inpatient Diabetes Program Recommendations  AACE/ADA: New Consensus Statement on Inpatient Glycemic Control (2015)  Target Ranges:  Prepandial:   less than 140 mg/dL      Peak postprandial:   less than 180 mg/dL (1-2 hours)      Critically ill patients:  140 - 180 mg/dL   Lab Results  Component Value Date   GLUCAP 128 (H) 05/14/2018   HGBA1C 14.9 (H) 05/06/2018    Review of Glycemic Control Results for Rebecca Johnston, Rebecca Johnston (MRN [id number]) as of 05/14/2018 16:45  Ref. Range 05/14/2018 04:23 05/14/2018 12:06 05/14/2018 16:25  Glucose-Capillary Latest Ref Range: 70 - 99 mg/dL 156 (H) 166 (H) 128 (H)   Diabetes history: T2DM Outpatient Diabetes medications: Glimepiride 2 mg daily Current orders for Inpatient glycemic control: Novolog Sensitive Correction Scale/SSI (0-9 units) AC Lantus 10 units daily  Inpatient Diabetes Program Recommendations:    Continue current inpatient insulin orders. Patient recently changed from dysphagia diet to regular diet. Consider changing diet to carb modified if appropriate and ordering CBGs TID AC + HS (current orders are for Q4H CBGs).  Spoke w/ patient regarding outpatient diabetes management. Has been seen multiple times in an attempt to teach insulin pen administration. Patient has no recollection or memory of information.  Reviewed patient's current A1c of 14.9. Explained what an A1c is and what it measures. Also reviewed goal A1c with patient, importance of good glucose control @ home, and blood sugar goals.   Educated patient on insulin pen use at home. Reviewed contents of insulin flexpen starter kit. Reviewed all steps of insulin pen including attachment of needle, 2-unit air shot, dialing up dose, giving injection, removing needle, disposal of sharps, storage of unused insulin, disposal of insulin etc. Patient was NOT able to provide successful return demonstration.   If patient is to be discharged to CIR, consider continuing insulin pen teaching in  rehab and re-evaluating patient's ability to use safely at home. Patient lives alone and I do not feel that home insulin pen use is a safe option at this time given her inability to perform basic steps of insulin administration without significant assistance at this time.   Thanks, Bronson Curb, MSN, RNC-OB Diabetes Coordinator [phone] (8a-5p)

## 2018-05-14 NOTE — Progress Notes (Signed)
Meredith Staggers, MD  Physician  Physical Medicine and Rehabilitation  PMR Pre-admission  Signed  Date of Service:  05/13/2018 2:46 PM       Related encounter: ED to Hosp-Admission (Current) from 05/06/2018 in [place]      Signed         Show:Clear all '[x]' Manual'[x]' Template'[x]' Copied  Added by: '[x]' Karl Bales, Evalee Mutton, RN'[x]' Meredith Staggers, MD'[x]' Michel Santee, PT  '[]' Hover for details [employer] PMR Admission Coordinator Pre-Admission Assessment  Patient: Rebecca Johnston is an 48 y.o., female MRN: [id number] DOB: [birth year] Height: '5\' 5"'  (165.1 cm) Weight: 116.9 kg  Insurance Information HMO: No  PPO:       PCP:       IPA:       80/20:       OTHER:   PRIMARY:  Medicaid [place] access      Policy#: [id number]      Subscriber: patient CM Name:        Phone#:       Fax#:   Pre-Cert#:        Employer: works PT Benefits:  Phone #: [phone]     Name: automated  Eff. Date: Eligible 05/12/18 with coverage code MAFCN     Deduct:        Out of Pocket Max:        Life Max:   CIR:        SNF:   Outpatient:       Co-Pay:   Home Health:        Co-Pay:   DME:       Co-Pay:   Providers:    Medicaid Application Date:        Case Manager:  Disability Application Date:        Case Worker:    Emergency Contact Information         Contact Information    Name Relation Home Work Mobile   Taff,Christine Mother [phone]  [phone]      Current Medical History  Patient Admitting Diagnosis: R temporal seizures  History of Present Illness:a 48 year old right-handed female with history of diabetes mellitus maintained on Amaryl 2 mg daily, asthma quit smoking 2 years ago, CKDstage III,GERD, morbid obesity with BMI 43.4. Per chart review patient lives with her children ages 19 and 49. Independent prior to admission still working and drives. Presented 05/06/2018 to [facility] with headache, blurred vision and dizziness 1 week.  Headache located right temporal area. Patient did note some slurred speech and facial droop as well as occasional nausea with episode of vomiting. Noted recently community-acquired pneumonia 04/29/2018 with persistent cough nasal congestion she was placed on doxycycline by EDP.Marland Kitchen CT/MRI showed no acute stroke or mass effect. CT angiogram of head and neck. Urine drug screen negative. She had elevated glucose on admission of 355 bicarbonate low at 18 hyponatremic 127. She did receive Reglan and Compazine for nausea and later that day noted neck deviation with possible eye deviation as well suspect seizure. She was loadedinitially with Dilantin changed to Keppra and Lyrica. EEG showed 2 electrographic seizures emanating out of the right temporal region and spreading mostly to the entire right side. Her Keppra was changed to 1500 mg every 12 hours. Subcutaneous Lovenox has been added for DVT prophylaxis. Patient is currently nothing by mouth with nasogastric tube feeds for nutritional support followed by speech therapy. Patient did initially part intubation 05/09/2018 05/11/2018. Bouts of hypotension requiring nor-epi. Therapy evaluations completed  with recommendations of physical medicine rehabilitation consult. Patient to be admitted for a comprehensive inpatient rehabilitation program.   Patients medical record from [facility] has been reviewed by the rehabilitation admission coordinator and physician.  Past Medical History      Past Medical History:  Diagnosis Date   Anemia    Asthma    Diabetes mellitus without complication ([place]) 82/9562   Fibroids    GERD (gastroesophageal reflux disease)    History of blood transfusion Feb 2015   Obesity, Class III, BMI 40-49.9 (morbid obesity) ([place])    Shortness of breath dyspnea     Family History   family history includes Breast cancer in her maternal aunt; Diabetes in her father, maternal aunt, maternal aunt, maternal aunt, and paternal  aunt; Hypertension in her mother.  Prior Rehab/Hospitalizations Has the patient had major surgery during 100 days prior to admission? No               Current Medications  Current Facility-Administered Medications:    0.9 %  sodium chloride infusion, , Intravenous, PRN, Erick Colace, NP, Stopped at 05/13/18 1029   [DISCONTINUED] acetaminophen (TYLENOL) tablet 650 mg, 650 mg, Oral, Q6H PRN, 650 mg at 05/08/18 0828 **OR** acetaminophen (TYLENOL) suppository 650 mg, 650 mg, Rectal, Q6H PRN, Erick Colace, NP   acetaminophen (TYLENOL) tablet 650 mg, 650 mg, Oral, Q6H PRN, Barb Merino, MD, 650 mg at 05/14/18 0504   enoxaparin (LOVENOX) injection 60 mg, 60 mg, Subcutaneous, Daily, Erick Colace, NP, 60 mg at 05/14/18 0825   fluticasone (FLONASE) 50 MCG/ACT nasal spray 2 spray, 2 spray, Each Nare, Daily, Erick Colace, NP, 2 spray at 05/14/18 1304   hydrALAZINE (APRESOLINE) injection 5 mg, 5 mg, Intravenous, Q2H PRN, Erick Colace, NP   insulin aspart (novoLOG) injection 0-9 Units, 0-9 Units, Subcutaneous, Q4H, Erick Colace, NP, 2 Units at 05/14/18 1303   insulin glargine (LANTUS) injection 10 Units, 10 Units, Subcutaneous, Daily, Erick Colace, NP, 10 Units at 05/14/18 0825   levalbuterol (XOPENEX) nebulizer solution 0.63 mg, 0.63 mg, Nebulization, Q6H PRN, Erick Colace, NP   levETIRAcetam (KEPPRA) tablet 1,000 mg, 1,000 mg, Oral, BID, Svalina, Gorica, MD   LORazepam (ATIVAN) injection 1 mg, 1 mg, Intravenous, Q2H PRN, Erick Colace, NP, 1 mg at 05/09/18 1800   MEDLINE mouth rinse, 15 mL, Mouth Rinse, BID, Ghimire, Kuber, MD, 15 mL at 05/14/18 1005   [DISCONTINUED] ondansetron (ZOFRAN) tablet 4 mg, 4 mg, Oral, Q6H PRN **OR** ondansetron (ZOFRAN) injection 4 mg, 4 mg, Intravenous, Q6H PRN, Erick Colace, NP   pregabalin (LYRICA) capsule 200 mg, 200 mg, Oral, TID, Barb Merino, MD, 200 mg at 05/14/18 1308  Patients Current Diet:      Diet  Order                  Diet regular Room service appropriate? Yes with Assist; Fluid consistency: Thin  Diet effective now               Precautions / Restrictions Precautions Precautions: Fall Precaution Comments: ataxic with decreased safety awareness Restrictions Weight Bearing Restrictions: No   Has the patient had 2 or more falls or a fall with injury in the past year?No  Prior Activity Level Community (5-7x/wk): works PT 5 days a week, was driving.  Prior Functional Level Prior Function Level of Independence: Independent Comments: works, drives   Self Care: Did the patient need help bathing, dressing, using the  toilet or eating?  Independent  Indoor Mobility: Did the patient need assistance with walking from room to room (with or without device)? Independent  Stairs: Did the patient need assistance with internal or external stairs (with or without device)? Independent  Functional Cognition: Did the patient need help planning regular tasks such as shopping or remembering to take medications? Independent  Home Assistive Devices / Equipment Home Assistive Devices/Equipment: Eyeglasses, CBG Meter  Prior Device Use: Indicate devices/aids used by the patient prior to current illness, exacerbation or injury? None  Prior Functional Level Comments: works, drives   Prior Functional Level Current Functional Level  Bed Mobility Independent Min guard assist  Transfers Independent Min assist +2  Mobility - Walk/Wheelchair Independent Min assist  Mobility - Ambulation/Gait Independent Min assist for stability with stepping in place, brief periods of blank stare (~4 to 5 seconds) with attempts at forward locomotion  Upper Body Dressing Independent not attempted  Lower Body Dressing Independent  not attempted  Grooming Independent  not attempted   Eating/Drinking Independent  Supervision   Toilet Transfer Independent  Min assist  Bladder Continence WDL  Purwick catheter in place  Bowel Management  WDL Last BM 05/08/18  Stair Climbing WDL Not tested  Communication WDL Other (comment)(dysarthric)  Memory WDL Decreased short-term memory  Cooking/Meal Prep  Independent     Housework  Independent   Money Management  Independent   Driving  Yes, driving     Special needs/care consideration BiPAP/CPAP No, but says she does have sleep apnea CPM No Continuous Drip IV No Dialysis No    Life Vest No Oxygen No Special Bed No Trach Size No Wound Vac (area) No     Skin No                        Bowel mgmt: Last BM 05/05/18 Bladder mgmt: Purwick external catheter Diabetic mgmt Yes, on oral medications at home  Previous Home Environment Living Arrangements: Children(10 y.o. and 33 y.o.) Available Help at Discharge: Family, Available PRN/intermittently Home Care Services: No  Discharge Living Setting Plans for Discharge Living Setting: Lives with (comment), Apartment(Lives with 10 yo daughter.) Type of Home at Discharge: Apartment(2nd floor apartment) Discharge Home Layout: One level, Able to live on main level with bedroom/bathroom Discharge Home Access: Stairs to enter Entrance Stairs-Rails: Right Entrance Stairs-Number of Steps: at least 10 steps to 2nd level apartment Discharge Bathroom Shower/Tub: Tub/shower unit, Curtain Discharge Bathroom Toilet: Standard Discharge Bathroom Accessibility: Yes How Accessible: Accessible via walker Does the patient have any problems obtaining your medications?: No  Social/Family/Support Systems Patient Roles: Parent(Has a 63 yo daughter, 27 yo son in school) Contact Information: Laurina Fischl - mom - [phone] Anticipated Caregiver: Mom works.  98 yo son in school.  36 yo dtr at home. Caregiver Availability: Intermittent Discharge Plan Discussed with Primary Caregiver: No(Discussed with patient) Is Caregiver In Agreement with Plan?: Yes Does Caregiver/Family have Issues with  Lodging/Transportation while Pt is in Rehab?: No  Goals/Additional Needs Patient/Family Goal for Rehab: PT/OT/SLP  mod I goals Expected length of stay: 7 days Cultural Considerations: None Dietary Needs: NPO Equipment Needs: TBD Pt/Family Agrees to Admission and willing to participate: Yes Program Orientation Provided & Reviewed with Pt/Caregiver Including Roles  & Responsibilities: Yes  Patient Condition: I have reviewed medical records from [facility], spoken with case manager and attending MD, and patient. I met with patient at the bedside  for inpatient rehabilitation assessment.  Patient will  benefit from ongoing PT/OT/SLP, can actively participate in 3 hours of therapy a day 5 days of the week, and can make measurable gains during the admission.  Patient will also benefit from the coordinated team approach during an Inpatient Acute Rehabilitation admission.  The patient will receive intensive therapy as well as Rehabilitation physician, nursing, social worker, and care management interventions.  Due to (bowel management, bladder management, safety, skin/wound care, disease management, medical administration, pain management, patient education) the patient requires 24 hour a day rehabilitation nursing.  The patient is currently min assist with mobility.  Discharge setting and therapy post discharge at home [facility] is anticipated.  Patient has agreed to participate in the Acute Inpatient Rehabilitation Program and will admit today.  Preadmission Screen Completed By: Karene Fry, with brief updates by Michel Santee, 05/14/2018 3:40 PM ______________________________________________________________________   Discussed status with Dr. Naaman Plummer on 05/14/18 at 3:41 PM and received telephone approval for admission today.  Admission Coordinator: Karene Fry, with brief updates by Michel Santee, time 3:41 PM /Date 05/14/18    Assessment/Plan: Diagnosis: status elepticus and associated  encephalopathy and debility 1. Does the need for close, 24 hr/day  Medical supervision in concert with the patient's rehab needs make it unreasonable for this patient to be served in a less intensive setting? Yes 2. Co-Morbidities requiring supervision/potential complications: DM2, CKDIII, GERD, morbid obesity 3. Due to bladder management, bowel management, safety, skin/wound care, disease management, medication administration, pain management and patient education, does the patient require 24 hr/day rehab nursing? Yes 4. Does the patient require coordinated care of a physician, rehab nurse, PT (1-2 hrs/day, 5 days/week), OT (1-2 hrs/day, 5 days/week) and SLP (1-2 hrs/day, 5 days/week) to address physical and functional deficits in the context of the above medical diagnosis(es)? Yes Addressing deficits in the following areas: balance, endurance, locomotion, strength, transferring, bowel/bladder control, bathing, dressing, feeding, grooming, toileting, cognition and psychosocial support 5. Can the patient actively participate in an intensive therapy program of at least 3 hrs of therapy 5 days a week? Yes 6. The potential for patient to make measurable gains while on inpatient rehab is excellent 7. Anticipated functional outcomes upon discharge from inpatients are: modified independent PT, modified independent OT, modified independent SLP 8. Estimated rehab length of stay to reach the above functional goals is: 7-12 days 9. Anticipated D/C setting: Home 10. Anticipated post D/C treatments: [facility] therapy 11. Overall Rehab/Functional Prognosis: excellent  Meredith Staggers, MD, [place] Physical [facility] Rehabilitation 05/14/2018   Karene Fry, with brief updates by Michel Santee 05/14/2018        Revision History

## 2018-05-14 NOTE — Care Management Note (Signed)
Case Management Note  Patient Details  Name: FRADEL BALDONADO MRN: [id number] Date of Birth: [birth year]  Subjective/Objective:                    Action/Plan: Pt discharging to CIR today. CM signing off.  Expected Discharge Date:  05/14/18               Expected Discharge Plan:  [place]  In-House Referral:     Discharge planning Services  CM Consult  Post Acute Care Choice:    Choice offered to:     DME Arranged:    DME Agency:     HH Arranged:    HH Agency:     Status of Service:  Completed, signed off  If discussed at [employer], dates discussed:    Additional Comments:  Pollie Friar, RN 05/14/2018, 3:22 PM

## 2018-05-14 NOTE — Progress Notes (Signed)
Pt arrived to unit via bed. Side rails x 3. Oriented to room and unit. Safety plan/precautions oriented. No c/o noted. Call bell placed in reach. Will cont to monitor.   Erie Noe, LPN

## 2018-05-14 NOTE — Progress Notes (Signed)
  Speech Language Pathology Treatment:    Patient Details Name: Rebecca Johnston MRN: [id number] DOB: March 04, 1970 Today's Date: 05/14/2018 Time: 8938-1017 SLP Time Calculation (min) (ACUTE ONLY): 15 min  Assessment / Plan / Recommendation Clinical Impression  Pt was seen during lunch for dysphagia treatment to assess tolerance of recommended diet. She consumed a meal of pork chops, collard greens, a roll, peaches, and thin liquids via cup. She tolerated solids and liquids without overt s/sx of aspiration. Mastication time was functional and no significant oral residue was noted. She tolerated trials of regular texture solids without symptoms of oropharyngeal dysphagia. It is recommended that her diet be upgraded to regular texture solids and SLP will continue to follow to ensure tolerance of the upgraded diet. Her cognition is still not at baseline and she currently has discharge orders. Speech/language/cognition evaluation and treatment will be needed.   HPI HPI: Pt is a 48 y.o. female with medical history significant of diabetes mellitus, asthma, cardiac, obesity, CKD stage III, who presented with headache, blurry vision, dizziness. Patient stated that she has been having headaches for more than 1 week in the right temporal area, constant, 8 out of 10 severity, nonradiating. She also reported right leg weakness, slurred speech, and facial droop. CT of the head and MRI were negative for acute pathology. Pt was found obtunded only responsive to pain on 3/8 and was intubated from 05/09/18 to 05/11/18.  EEG 3/6 right temporal complex partial seizure. EEG 3/10 showed evidence of an improving diffuse encephalopathy. Chest x-ray of 05/11/18 revealed lower lobe atelectatic change bilaterally but a degree of superimposed pneumonia in these areas could not be excluded entirely.       SLP Plan  Goals updated       Recommendations  Diet recommendations: Regular;Thin liquid Liquids provided via:  Straw;Cup Medication Administration: Via alternative means Supervision: Patient able to self feed Compensations: Slow rate;Small sips/bites;Other (Comment)(Individual sips) Postural Changes and/or Swallow Maneuvers: Seated upright 90 degrees                Oral Care Recommendations: Oral care BID Follow up Recommendations: Inpatient Rehab SLP Visit Diagnosis: Dysphagia, oropharyngeal phase (R13.12) Plan: Goals updated       Vandella Ord I. Hardin Negus, [place], [place] Office number [phone] Pager [place] 05/14/2018, 2:26 PM

## 2018-05-14 NOTE — Progress Notes (Signed)
Pt discharge to CIR; telemetry removed; IV remains intact; pt education completed; pt to be transported off unit via bed with belongings to the side. P.[employer] RN

## 2018-05-14 NOTE — Discharge Summary (Signed)
DISCHARGE SUMMARY  Rebecca Johnston  MR#: [id number]  DOB:Nov 05, 1970  Date of Admission: 05/06/2018 Date of Discharge: 05/14/2018  Attending Physician:Jeffrey Hennie Duos, MD  Patient's YIF:OYDXAJO, Dalbert Batman, MD  Consults: Neurology  PCCM   Disposition: D/C to CIR   Follow-up Appts: Follow-up Information    Schedule an appointment as soon as possible for a visit  with Ladell Pier, MD.   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 Neurology was consulted and EEG demonstrated partial seizures. 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 She continued to have dystonic head movements, and developed confusion. She was unable to protect her airway and became very lethargic. She was started on Keppra for presumed seizure and transferred to [facility] for continuous EEG monitoring. EEG showed multiple right temporal seizures. Patient was diagnosed with status epilepticus, loaded with fosphenytoin and Dilantin, and moved to the ICU where she was intubated. She was subsequently extubated to room air and transfer out of ICU.  Hospital Course:  Refractory focal seizure with status epilepticus > Todd's paresis and acute metabolic encephalopathy Suspected precipitated with hyperglycemia - treated with multiple AEDs and propofol - seizures now controlled - med titration per Neurology   Acute hypoxic respiratory failure secondary to status epilepticus Extubated to room air  CKD stage III Stable at baseline of ~1.6   DM with uncontrolled blood sugars A1c 14 - was on oral hypoglycemics at home - will need to go home on insulin - CBG currently well controlled   Dysphagia due to intubation - cleared by SLP for D3 diet   Meds at time of D/C to CIR:  No current facility-administered medications for this encounter.  No current outpatient medications on file.  Facility-Administered Medications Ordered in Other Encounters:  .  acetaminophen (TYLENOL) suppository 650 mg, 650 mg, Rectal, Q6H PRN, Angiulli, Lavon Paganini, PA-C .  acetaminophen (TYLENOL) tablet 650 mg, 650 mg, Oral, Q6H PRN, Angiulli, Lavon Paganini, PA-C .  albuterol (PROVENTIL) (5 MG/ML) 0.5% nebulizer solution 2.5 mg, 2.5 mg, Nebulization, Q4H PRN, Angiulli, Lavon Paganini, PA-C .  enoxaparin (LOVENOX) injection 60 mg, 60 mg, Subcutaneous, Daily, Angiulli, Daniel J, PA-C .  enoxaparin (LOVENOX) injection 60 mg, 60 mg, Subcutaneous, Q24H, Angiulli, Lavon Paganini, PA-C .  [START ON 05/15/2018] fluticasone (FLONASE) 50 MCG/ACT nasal spray 2 spray, 2 spray, Each Nare, Daily, Angiulli, Daniel J, PA-C .  insulin aspart (novoLOG) injection 0-9 Units, 0-9 Units, Subcutaneous, TID WC, Angiulli, Lavon Paganini, PA-C .  insulin glargine (LANTUS) injection 10 Units, 10 Units, Subcutaneous, Daily, Angiulli, Lavon Paganini, PA-C .  levETIRAcetam (KEPPRA) 100 MG/ML solution 1,500 mg, 1,500 mg, Oral, BID, Angiulli, Lavon Paganini, PA-C .  LORazepam (ATIVAN) injection 1 mg, 1 mg, Intravenous, Q2H PRN, Angiulli, Lavon Paganini, PA-C .  pregabalin (LYRICA) capsule 200 mg, 200 mg, Oral, TID, Cathlyn Parsons, PA-C   Day of Discharge BP 112/77 (BP Location: Right Arm)   Pulse (!) 101   Temp 99.6 F (37.6 C) (Oral)   Resp 20   Ht 5\' 5"  (1.651 m)   Wt 116.9 kg   LMP 11/26/2014 (  Exact Date)   SpO2 100%   BMI 42.89 kg/m   Physical Exam: General: No acute respiratory distress Lungs: Clear to auscultation bilaterally without wheezes or crackles Cardiovascular: Regular rate and rhythm without murmur  gallop or rub normal S1 and S2 Abdomen: Nontender, nondistended, soft, bowel sounds positive, no rebound, no ascites, no appreciable mass Extremities: No significant cyanosis, clubbing, or edema bilateral lower extremities  Basic Metabolic Panel: Recent Labs  Lab 05/09/18 2049 05/10/18 0355 05/10/18 0358 05/10/18 1240 05/10/18 1714 05/11/18 0500 05/12/18 0428 05/13/18 0402  NA 136 139 139  --   --  137 138 134*  K 3.8 3.7 3.8  --   --  4.0 4.5 3.8  CL 109  --  112*  --   --  108 105 101  CO2 19*  --  20*  --   --  21* 28 25  GLUCOSE 171*  --  173*  --   --  175* 118* 197*  BUN 9  --  8  --   --  10 9 18   CREATININE 1.49*  --  1.47*  --   --  1.64* 1.49* 1.45*  CALCIUM 8.9  --  8.1*  --   --  8.7* 9.1 8.8*  MG 2.0  --  2.1 2.2 2.3 2.1  --   --   PHOS  --   --   --  3.9 4.0 4.5  --   --     CBC: Recent Labs  Lab 05/08/18 0514  05/09/18 2049 05/10/18 0355 05/10/18 0358 05/11/18 0500 05/12/18 0428  WBC 8.1  --  11.6*  --  11.9* 9.2 8.6  HGB 11.2*   < > 11.2* 11.9* 11.3* 10.3* 9.9*  HCT 38.6   < > 37.8 35.0* 39.1 34.8* 35.1*  MCV 72.8*  --  71.1*  --  72.1* 72.5* 74.2*  PLT 257  --  371  --  358 236 259   < > = values in this interval not displayed.    CBG: Recent Labs  Lab 05/13/18 2051 05/14/18 0233 05/14/18 0423 05/14/18 1206 05/14/18 1625  GLUCAP 156* 139* 156* 166* 128*    Recent Results (from the past 240 hour(s))  Culture, blood (Routine X 2) w Reflex to ID Panel     Status: None   Collection Time: 05/07/18  1:40 AM  Result Value Ref Range Status   Specimen Description   Final    BLOOD RIGHT ANTECUBITAL Performed at [facility], [place] [address], [place], [place] [address]    Special Requests   Final    BOTTLES DRAWN AEROBIC AND ANAEROBIC Blood Culture adequate volume Performed at [place] [address] [place], [place] [address]    Culture   Final    NO GROWTH 5 DAYS Performed at [facility], [place] [address], [place], [place] [address]    Report Status 05/12/2018 FINAL  Final  Culture, blood (Routine X 2) w Reflex to ID Panel     Status: None   Collection Time: 05/07/18  3:31 AM  Result Value Ref Range Status   Specimen Description   Final    BLOOD LEFT HAND Performed at [facility], [place] [address] [place], [place] [address]    Special Requests   Final    BOTTLES DRAWN AEROBIC AND ANAEROBIC Blood Culture adequate volume Performed at [place] [address] [place], [place] [address]  Culture   Final    NO GROWTH 5 DAYS Performed at [facility], [place] [address] [place], [place] [address]    Report Status 05/12/2018 FINAL  Final  MRSA PCR Screening     Status: None   Collection Time: 05/09/18 12:45 PM  Result Value Ref Range Status   MRSA by PCR NEGATIVE NEGATIVE Final    Comment:        The GeneXpert MRSA Assay (FDA approved for NASAL specimens only), is one component of a comprehensive MRSA colonization surveillance program. It is not intended to diagnose MRSA infection nor to guide or monitor treatment for MRSA infections. Performed at [facility], [place] [address] [place], [place] [address]      Time spent in discharge (includes decision making & examination of pt): 35 minutes  05/14/2018, 5:52 PM   Cherene Altes, MD [facility] Office  [phone]

## 2018-05-14 NOTE — Progress Notes (Signed)
According to NT Rebecca Johnston; pt was assisted to the [facility] and she the tech went to BR to grab wash cloth to clean pt but she saw pt standing off the commode as she (NT) peaked her head into the room to check on pt. She witnessed pt standing up from the commode to wipe/clean herself; as pt the NT said she tried to catch pt but pt lowered herself to the floor on her knees and hands. Another RN Insurance claims handler and charge RN Abbe Amsterdam came to the room as pt RN was with another pt. Pt assisted back to bed by staffs and VSS; pt denies any pain or discomfort; pt denies any injuries and non witnessed. On-call NP paged and notified; pt family not notified d/t time of incident according to RN Phil. Fall/safety precaution and prevention education reinforced; bed alarm on; call light within reach. Reported off to oncoming RN. Delia Heady RN   05/14/18 0051  What Happened  Was fall witnessed? Yes  Who witnessed fall? Rebecca Johnston  Patients activity before fall bathroom-unassisted (using BSC)  Point of contact hip/leg;arm/shoulder (hands and Knee according to NT)  Was patient injured? No  Follow Up  MD notified y  Time MD notified 86  Family notified No- patient refusal  Additional tests No  Progress note created (see row info) Yes  Adult Fall Risk Assessment  Risk Factor Category (scoring not indicated) Not Applicable  Age 48  Fall History: Fall within 6 months prior to admission 0  Elimination; Bowel and/or Urine Incontinence 0  Elimination; Bowel and/or Urine Urgency/Frequency 0  Medications: includes PCA/Opiates, Anti-convulsants, Anti-hypertensives, Diuretics, Hypnotics, Laxatives, Sedatives, and Psychotropics 5  Patient Care Equipment 2  Mobility-Assistance 2  Mobility-Gait 2  Mobility-Sensory Deficit 0  Altered awareness of immediate physical environment 0  Impulsiveness 0  Lack of understanding of one's physical/cognitive limitations 0  Total Score 11  Patient Fall Risk Level Moderate fall risk  Adult Fall Risk  Interventions  Required Bundle Interventions *See Row Information* Moderate fall risk - low and moderate requirements implemented  Additional Interventions Pharmacy review of medications;PT/OT need assessed if change in mobility from baseline;Reorient/diversional activities with confused patients;Use of appropriate toileting equipment (bedpan, BSC, etc.)  Screening for Fall Injury Risk (To be completed on HIGH fall risk patients) - Assessing Need for Low Bed  Risk For Fall Injury- Low Bed Criteria None identified - Continue screening  Screening for Fall Injury Risk (To be completed on HIGH fall risk patients who do not meet crieteria for Low Bed) - Assessing Need for Floor Mats Only  Risk For Fall Injury- Criteria for Floor Mats None identified - No additional interventions needed  Vitals  Temp 98.9 F (37.2 C)  Temp Source Oral  BP 103/75  MAP (mmHg) 85  BP Location Left Arm  BP Method Automatic  Patient Position (if appropriate) Sitting  Pulse Rate (!) 103  Pulse Rate Source Dinamap  Resp 20  Oxygen Therapy  SpO2 99 %  O2 Device Room Air  Pain Assessment  Pain Scale 0-10  Pain Score 0  Neurological  Neuro (WDL) X  Level of Consciousness Alert  Orientation Level Oriented X4  Cognition Appropriate at baseline;Follows commands  Speech Clear  Pupil Assessment  No  Neuro Symptoms Drowsiness  Musculoskeletal  Musculoskeletal (WDL) X  Assistive Device BSC;Front wheel walker  Generalized Weakness Yes  Weight Bearing Restrictions No  Integumentary  Integumentary (WDL) X  Skin Color Appropriate for ethnicity  Skin Condition Dry;Flaky  Skin Integrity  MASD

## 2018-05-14 NOTE — H&P (Signed)
Physical Medicine and Rehabilitation Admission H&P        Chief Complaint  Patient presents with   Headache    Chief complaint:headache   HPI: Rebecca Johnston is a 48 year old right-handed female with history of diabetes mellitus maintained on Amaryl 2 mg daily, asthma quit smoking 2 years ago, CKDstage III,GERD, morbid obesity with BMI 43.4. Per chart review patient lives with her children ages 45 and 92 (Pt told me 15 and 39). Independent prior to admission still working and drives. She has a mother in the area who can provide intermittent assistance. Presented 05/06/2018 to [facility] with headache, blurred vision and dizziness 1 week. Headache located right temporal area. Patient did note some slurred speech and facial droop as well as occasional nausea with episode of vomiting. Noted recently community-acquired pneumonia 04/29/2018 with persistent cough nasal congestion she was placed on doxycycline by EDP.Marland Kitchen CT/MRI showed no acute stroke or mass effect. CT angiogram of head and neck. Urine drug screen negative. She had elevated glucose on admission of 355 bicarbonate low at 18 hyponatremic 127. She did receive Reglan and Compazine for nausea and later that day noted neck deviation with possible eye deviation as well suspect seizure. She was loaded initially with Dilantin changed to Keppra and Lyrica. EEG showed 2 electrographic seizures emanating out of the right temporal region and spreading mostly to the entire right side. Her Keppra was changed to 1500 mg every 12 hours. Neurology feels seizures caused by severe hyperglycemia. Subcutaneous Lovenox has been added for DVT prophylaxis. Patient  nothing by mouth with nasogastric tube feeds for nutritional support followed by speech therapy until follow-up modified barium swallow 05/13/2018 cleared her for mechanical soft thin liquid diet initiated. Patient did require intubation 05/09/2018 05/11/2018. Bouts of hypotension requiring  nor-epi. Therapy evaluations completed with recommendations of physical medicine rehabilitation consult. Patient was admitted for a comprehensive rehabilitation program.   Review of Systems  Constitutional: Negative for chills and fever.  HENT: Negative for hearing loss and tinnitus.   Eyes: Positive for blurred vision. Negative for photophobia and pain.  Respiratory: Positive for cough.        Shortness of breath with exertion  Cardiovascular: Negative for chest pain, palpitations and leg swelling.  Gastrointestinal: Positive for nausea and vomiting. Negative for abdominal pain, diarrhea and heartburn.       GERD  Genitourinary: Negative for dysuria, hematuria and urgency.  Musculoskeletal: Positive for myalgias.  Skin: Negative for rash.  Neurological: Positive for dizziness, seizures and headaches.  Psychiatric/Behavioral: The patient has insomnia.   All other systems reviewed and are negative.       Past Medical History:  Diagnosis Date   Anemia     Asthma     Diabetes mellitus without complication ([place]) 16/9450   Fibroids     GERD (gastroesophageal reflux disease)     History of blood transfusion Feb 2015   Obesity, Class III, BMI 40-49.9 (morbid obesity) ([place])     Shortness of breath dyspnea           Past Surgical History:  Procedure Laterality Date   ABDOMINAL HYSTERECTOMY N/A 12/08/2014    Procedure: HYSTERECTOMY ABDOMINAL;  Surgeon: Woodroe Mode, MD;  Location: [facility];  Service: Gynecology;  Laterality: N/A;   DILATION AND CURETTAGE OF UTERUS       DILITATION & CURRETTAGE/HYSTROSCOPY WITH VERSAPOINT RESECTION N/A 01/06/2014    Procedure: Corena Pilgrim WITH VERSAPOINT;  Surgeon: Woodroe Mode, MD;  Location: [facility];  Service: Gynecology;  Laterality: N/A;   LAPAROSCOPIC LYSIS OF ADHESIONS N/A 12/08/2014    Procedure: LAPAROSCOPIC LYSIS OF ADHESIONS;  Surgeon: Michael Boston, MD;  Location: [facility];  Service: General;  Laterality: N/A;   OVARIAN CYST REMOVAL         cyst not removed. Cyst drained   SUPRA-UMBILICAL HERNIA N/A 81/04/7515    Procedure: LAPARSCOPIC SUPRA-UMBILICAL HERNIA REPAIR ;  Surgeon: Michael Boston, MD;  Location: [facility];  Service: General;  Laterality: N/A;   VENTRAL HERNIA REPAIR N/A 12/08/2014    Procedure: LAPAROSCOPIC INCARCERATED INCISIONAL VENTRAL WALL HERNIA REPAIR;  Surgeon: Michael Boston, MD;  Location: [facility];  Service: General;  Laterality: N/A;         Family History  Problem Relation Age of Onset   Hypertension Mother     Diabetes Father     Breast cancer Maternal Aunt     Diabetes Maternal Aunt     Diabetes Paternal Aunt     Diabetes Maternal Aunt     Diabetes Maternal Aunt      Social History:  reports that she quit smoking about 2 years ago. Her smoking use included cigarettes. She has a 6.25 pack-year smoking history. She has never used smokeless tobacco. She reports current alcohol use. She reports that she does not use drugs. Allergies:       Allergies  Allergen Reactions   Doxycycline        "bad dreaming, hallucination, dizziness" per pt          Medications Prior to Admission  Medication Sig Dispense Refill   aspirin-acetaminophen-caffeine (EXCEDRIN MIGRAINE) 250-250-65 MG tablet Take 2 tablets by mouth every 6 (six) hours as needed for headache.       cetirizine (ZYRTEC) 10 MG tablet Take 1 tablet (10 mg total) by mouth daily. (Patient taking differently: Take 10 mg by mouth daily as needed for allergies. ) 30 tablet 6   doxycycline (VIBRAMYCIN) 100 MG capsule Take 1 capsule (100 mg total) by mouth 2 (two) times daily. 20 capsule 0   fluticasone (FLONASE) 50 MCG/ACT nasal spray USE 2 SPRAY(S) IN EACH NOSTRIL ONCE DAILY (Patient taking differently: Place 2 sprays into both nostrils daily. ) 16 g 0   gabapentin (NEURONTIN) 100 MG capsule Take 100 mg by mouth 1-3 times daily as needed for nerve pains (Patient taking differently: Take 100 mg by mouth 3 (three) times daily as needed (nerve  pain). ) 90 capsule 1   glimepiride (AMARYL) 2 MG tablet Take 1 tablet (2 mg total) by mouth daily before breakfast. MUST MAKE APPT FOR FURTHER REFILLS 30 tablet 0   ranitidine (ZANTAC) 300 MG tablet 1 tab PO QHS PRN (Patient taking differently: Take 300 mg by mouth at bedtime as needed for heartburn. ) 30 tablet 6   ACCU-CHEK SOFTCLIX LANCETS lancets 1 each by Other route 2 (two) times daily. 100 each 12   Blood Glucose Monitoring Suppl (ACCU-CHEK AVIVA PLUS) w/Device KIT 1 Device by Does not apply route 2 (two) times daily. 1 kit 0   glucose blood (ACCU-CHEK AVIVA PLUS) test strip 1 each by Other route 2 (two) times daily. MUST MAKE APPT FOR FURTHER REFILLS 100 each 0   mupirocin ointment (BACTROBAN) 2 % Apply 1 application topically 2 (two) times daily. (Patient not taking: Reported on 04/29/2018) 22 g 0   PROAIR HFA 108 (90 Base) MCG/ACT inhaler Inhale 2 puffs into the lungs every 6 (six) hours as needed for  wheezing or shortness of breath. (Patient not taking: Reported on 05/06/2018) 1 Inhaler 3      Drug Regimen Review Drug regimen was reviewed and remains appropriate with no significant issues identified   Home: Home Living Family/patient expects to be discharged to:: Private residence Living Arrangements: Children(10 y.o. and 108 y.o.) Available Help at Discharge: Family, Available PRN/intermittently   Functional History: Prior Function Level of Independence: Independent Comments: works, drives   Functional Status:  Mobility: Bed Mobility Overal bed mobility: Needs Assistance Bed Mobility: Supine to Sit Supine to sit: HOB elevated, Min guard General bed mobility comments: increased time and effort, min guard for safety Transfers Overall transfer level: Needs assistance Equipment used: Rolling walker (2 wheeled) Transfers: Sit to/from Stand, [employer] Transfers Sit to Stand: Min assist, +2 safety/equipment Stand pivot transfers: Min assist, +2 safety/equipment General  transfer comment: increased time and effort, cueing for safe hand placement, some assist needed to power into standing and for stability with transitional movements Ambulation/Gait General Gait Details: pt able to take some steps in place with min A for stability; attempted forward stepping but pt with increasing dizziness and with brief periods of "blank stare" (~4-5 secs x2)   ADL:   Cognition: Cognition Overall Cognitive Status: Impaired/Different from baseline Orientation Level: Oriented X4 Cognition Arousal/Alertness: Awake/alert Behavior During Therapy: Flat affect Overall Cognitive Status: Impaired/Different from baseline Area of Impairment: Attention, Memory, Following commands, Safety/judgement, Awareness, Problem solving Current Attention Level: Sustained Memory: Decreased short-term memory Following Commands: Follows one step commands inconsistently, Follows one step commands with increased time Safety/Judgement: Decreased awareness of safety, Decreased awareness of deficits Awareness: Intellectual Problem Solving: Slow processing, Difficulty sequencing, Requires verbal cues, Requires tactile cues General Comments: Pt impulsive, slow to process information and commands.  When asked if she could safely get back to the bed on her own she said, "yes"   Physical Exam: Blood pressure 123/84, pulse 96, temperature 97.7 F (36.5 C), temperature source Oral, resp. rate 19, height '5\' 5"'  (1.651 m), weight 116.9 kg, last menstrual period 11/26/2014, SpO2 96 %. Physical Exam  Constitutional: No distress.  Obese, lethargic appearing.  HENT:  Head: Normocephalic and atraumatic.  Eyes: Pupils are equal, round, and reactive to light. EOM are normal.  Neck: Normal range of motion. No thyromegaly present.  Cardiovascular: Normal rate and regular rhythm. Exam reveals no friction rub.  No murmur heard. Respiratory: Effort normal. No respiratory distress. She has no wheezes. She has no  rales.  GI: Soft. She exhibits no distension. There is no abdominal tenderness.  Musculoskeletal: Normal range of motion.        General: Edema (BLE) present. No tenderness or deformity.  Neurological:  Pt aroused fairly easily when I arrived. Could tell me she was at [facility] and that she was here for a seizure. New the month. Told me about her children and the 57 yo who is in college. Said her mother was staying with the 45 yo. Moves UE3-4/5 prox to distal. LE: 3/5 prox to 4/5 distally. Sensed pain in all 4's. CN non-focal but didn't consistently participate in CN exam. Confused with impaired insight and awareness. Language normal. Speech slightly slurred   Skin: Skin is dry. She is not diaphoretic.  Psychiatric:  flat      Lab Results Last 48 Hours        Results for orders placed or performed during the hospital encounter of 05/06/18 (from the past 48 hour(s))  Glucose, capillary  Status: None    Collection Time: 05/12/18  8:33 AM  Result Value Ref Range    Glucose-Capillary 99 70 - 99 mg/dL    Comment 1 Notify RN      Comment 2 Document in Chart    Glucose, capillary     Status: Abnormal    Collection Time: 05/12/18 12:08 PM  Result Value Ref Range    Glucose-Capillary 104 (H) 70 - 99 mg/dL    Comment 1 Notify RN      Comment 2 Document in Chart    Glucose, capillary     Status: Abnormal    Collection Time: 05/12/18  4:31 PM  Result Value Ref Range    Glucose-Capillary 66 (L) 70 - 99 mg/dL  Glucose, capillary     Status: Abnormal    Collection Time: 05/12/18  6:02 PM  Result Value Ref Range    Glucose-Capillary 107 (H) 70 - 99 mg/dL  Glucose, capillary     Status: Abnormal    Collection Time: 05/12/18  7:34 PM  Result Value Ref Range    Glucose-Capillary 119 (H) 70 - 99 mg/dL    Comment 1 Notify RN      Comment 2 Document in Chart    Glucose, capillary     Status: Abnormal    Collection Time: 05/12/18 11:12 PM  Result Value Ref Range    Glucose-Capillary 117  (H) 70 - 99 mg/dL    Comment 1 Notify RN      Comment 2 Document in Chart    Glucose, capillary     Status: Abnormal    Collection Time: 05/13/18  3:48 AM  Result Value Ref Range    Glucose-Capillary 201 (H) 70 - 99 mg/dL    Comment 1 Notify RN      Comment 2 Document in Chart    Basic metabolic panel     Status: Abnormal    Collection Time: 05/13/18  4:02 AM  Result Value Ref Range    Sodium 134 (L) 135 - 145 mmol/L    Potassium 3.8 3.5 - 5.1 mmol/L    Chloride 101 98 - 111 mmol/L    CO2 25 22 - 32 mmol/L    Glucose, Bld 197 (H) 70 - 99 mg/dL    BUN 18 6 - 20 mg/dL    Creatinine, Ser 1.45 (H) 0.44 - 1.00 mg/dL    Calcium 8.8 (L) 8.9 - 10.3 mg/dL    GFR calc non Af Amer 42 (L) >60 mL/min    GFR calc Af Amer 49 (L) >60 mL/min    Anion gap 8 5 - 15      Comment: Performed at [facility], [place] [address], [place], [place] [address]  Glucose, capillary     Status: Abnormal    Collection Time: 05/13/18 10:07 AM  Result Value Ref Range    Glucose-Capillary 173 (H) 70 - 99 mg/dL  Glucose, capillary     Status: Abnormal    Collection Time: 05/13/18 12:48 PM  Result Value Ref Range    Glucose-Capillary 128 (H) 70 - 99 mg/dL  Glucose, capillary     Status: Abnormal    Collection Time: 05/13/18  4:21 PM  Result Value Ref Range    Glucose-Capillary 113 (H) 70 - 99 mg/dL  Glucose, capillary     Status: Abnormal    Collection Time: 05/13/18  8:51 PM  Result Value Ref Range    Glucose-Capillary 156 (H) 70 - 99 mg/dL  Glucose, capillary     Status: Abnormal    Collection Time: 05/14/18  4:23 AM  Result Value Ref Range    Glucose-Capillary 156 (H) 70 - 99 mg/dL    Comment 1 Notify RN         Imaging Results (Last 48 hours)  Dg Swallowing Func-speech Pathology   Result Date: 05/13/2018 Objective Swallowing Evaluation: Type of Study: MBS-Modified Barium Swallow Study  Patient Details Name: LILYANNE MCQUOWN MRN: [id number] Date of Birth: 1971-02-15 Today's Date: 05/13/2018 Time: SLP  Start Time (ACUTE ONLY): 3244 -SLP Stop Time (ACUTE ONLY): 0102 SLP Time Calculation (min) (ACUTE ONLY): 20 min Past Medical History: Past Medical History: Diagnosis Date  Anemia   Asthma   Diabetes mellitus without complication ([place]) 72/5366  Fibroids   GERD (gastroesophageal reflux disease)   History of blood transfusion Feb 2015  Obesity, Class III, BMI 40-49.9 (morbid obesity) ([place])   Shortness of breath dyspnea  Past Surgical History: Past Surgical History: Procedure Laterality Date  ABDOMINAL HYSTERECTOMY N/A 12/08/2014  Procedure: HYSTERECTOMY ABDOMINAL;  Surgeon: Woodroe Mode, MD;  Location: [facility];  Service: Gynecology;  Laterality: N/A;  DILATION AND CURETTAGE OF UTERUS    DILITATION & CURRETTAGE/HYSTROSCOPY WITH VERSAPOINT RESECTION N/A 01/06/2014  Procedure: Corena Pilgrim WITH VERSAPOINT;  Surgeon: Woodroe Mode, MD;  Location: [facility];  Service: Gynecology;  Laterality: N/A;  LAPAROSCOPIC LYSIS OF ADHESIONS N/A 12/08/2014  Procedure: LAPAROSCOPIC LYSIS OF ADHESIONS;  Surgeon: Michael Boston, MD;  Location: [facility];  Service: General;  Laterality: N/A;  OVARIAN CYST REMOVAL    cyst not removed. Cyst drained  SUPRA-UMBILICAL HERNIA N/A 44/0/3474  Procedure: LAPARSCOPIC SUPRA-UMBILICAL HERNIA REPAIR ;  Surgeon: Michael Boston, MD;  Location: [facility];  Service: General;  Laterality: N/A;  VENTRAL HERNIA REPAIR N/A 12/08/2014  Procedure: LAPAROSCOPIC INCARCERATED INCISIONAL VENTRAL WALL HERNIA REPAIR;  Surgeon: Michael Boston, MD;  Location: [facility];  Service: General;  Laterality: N/A; HPI: Pt is a 48 y.o. female with medical history significant of diabetes mellitus, asthma, cardiac, obesity, CKD stage III, who presented with headache, blurry vision, dizziness. Patient stated that she has been having headaches for more than 1 week in the right temporal area, constant, 8 out of 10 severity, nonradiating. She also reported right leg weakness, slurred speech, and facial droop. CT of the head and MRI were  negative for acute pathology. Pt was found obtunded only responsive to pain on 3/8 and was intubated from 05/09/18 to 05/11/18.  EEG 3/6 right temporal complex partial seizure. EEG 3/10 showed evidence of an improving diffuse encephalopathy. Chest x-ray of 05/11/18 revealed lower lobe atelectatic change bilaterally but a degree of superimposed pneumonia in these areas could not be excluded entirely.  No data recorded Assessment / Plan / Recommendation CHL IP CLINICAL IMPRESSIONS 05/13/2018 Clinical Impression Pt presents with oropharyngeal dysphagia characterized by impaired A-P transport, impaired bolus control, a pharyngeal delay, and reduced laryngeal excursion. She demonstrated lingual pumping, premature spillage to the valleculae, and penetration (PAS 3) of thin liquids when consecutive swallows were used. Penetration was eliminated with use of individual sips and no pharyngeal residue or instances of aspiration were demontrated. It is recommended that a dysphagia 3 diet with thin liquids be initiated at this time with obervance of swallowing precautions. SLP will follow to ensure tolerance of the recommended diet and for potential upgrade to regular consistency. SLP Visit Diagnosis Dysphagia, oropharyngeal phase (R13.12) Attention and concentration deficit following -- Frontal lobe and executive function deficit following -- Impact  on safety and function Mild aspiration risk   CHL IP TREATMENT RECOMMENDATION 05/13/2018 Treatment Recommendations Therapy as outlined in treatment plan below   Prognosis 05/13/2018 Prognosis for Safe Diet Advancement Good Barriers to Reach Goals Cognitive deficits Barriers/Prognosis Comment -- CHL IP DIET RECOMMENDATION 05/13/2018 SLP Diet Recommendations Dysphagia 3 (Mech soft) solids;Thin liquid Liquid Administration via Straw;Cup Medication Administration Whole meds with liquid Compensations Slow rate;Small sips/bites;Other (Comment) Postural Changes Seated upright at 90 degrees   CHL  IP OTHER RECOMMENDATIONS 05/13/2018 Recommended Consults -- Oral Care Recommendations Oral care BID Other Recommendations --   CHL IP FOLLOW UP RECOMMENDATIONS 05/13/2018 Follow up Recommendations Inpatient Rehab   CHL IP FREQUENCY AND DURATION 05/13/2018 Speech Therapy Frequency (ACUTE ONLY) min 2x/week Treatment Duration 2 weeks      CHL IP ORAL PHASE 05/13/2018 Oral Phase Impaired Oral - Pudding Teaspoon -- Oral - Pudding Cup -- Oral - Honey Teaspoon -- Oral - Honey Cup -- Oral - Nectar Teaspoon -- Oral - Nectar Cup -- Oral - Nectar Straw -- Oral - Thin Teaspoon -- Oral - Thin Cup Lingual pumping;Premature spillage Oral - Thin Straw Lingual pumping;Premature spillage Oral - Puree Lingual pumping;Premature spillage Oral - Mech Soft Lingual pumping Oral - Regular Lingual pumping Oral - Multi-Consistency -- Oral - Pill Lingual pumping;Premature spillage Oral Phase - Comment --  CHL IP PHARYNGEAL PHASE 05/13/2018 Pharyngeal Phase Impaired Pharyngeal- Pudding Teaspoon -- Pharyngeal -- Pharyngeal- Pudding Cup -- Pharyngeal -- Pharyngeal- Honey Teaspoon -- Pharyngeal -- Pharyngeal- Honey Cup -- Pharyngeal -- Pharyngeal- Nectar Teaspoon -- Pharyngeal -- Pharyngeal- Nectar Cup -- Pharyngeal -- Pharyngeal- Nectar Straw -- Pharyngeal -- Pharyngeal- Thin Teaspoon -- Pharyngeal -- Pharyngeal- Thin Cup Delayed swallow initiation-vallecula;Reduced anterior laryngeal mobility Pharyngeal -- Pharyngeal- Thin Straw Delayed swallow initiation-vallecula;Reduced anterior laryngeal mobility;Penetration/Aspiration during swallow;Penetration/Aspiration before swallow Pharyngeal Material enters airway, remains ABOVE vocal cords and not ejected out Pharyngeal- Puree Delayed swallow initiation-vallecula;Reduced anterior laryngeal mobility Pharyngeal -- Pharyngeal- Mechanical Soft Delayed swallow initiation-vallecula;Reduced anterior laryngeal mobility Pharyngeal -- Pharyngeal- Regular Delayed swallow initiation-vallecula;Reduced anterior  laryngeal mobility Pharyngeal -- Pharyngeal- Multi-consistency -- Pharyngeal -- Pharyngeal- Pill Delayed swallow initiation-vallecula;Reduced anterior laryngeal mobility Pharyngeal -- Pharyngeal Comment --  CHL IP CERVICAL ESOPHAGEAL PHASE 05/13/2018 Cervical Esophageal Phase WFL Pudding Teaspoon -- Pudding Cup -- Honey Teaspoon -- Honey Cup -- Nectar Teaspoon -- Nectar Cup -- Nectar Straw -- Thin Teaspoon -- Thin Cup -- Thin Straw -- Puree -- Mechanical Soft -- Regular -- Multi-consistency -- Pill -- Cervical Esophageal Comment -- Shanika I. Hardin Negus, [place], [place] Office number [phone] Pager [place] 05/13/2018, 3:45 PM                        Medical Problem List and Plan: 1.  Decreased functional mobility with headache dizziness secondary to refractory focal status epilepticus possibly precipitated by severe hyperglycemia. Additionally debilitated from multiple medical issues and prolonged hospital stay             -admit to inpatient rehab             -start sleep chart 2.  Antithrombotics: -DVT/anticoagulation:  Subcutaneous Lovenox. Monitor for bleeding episodes             -antiplatelet therapy: none 3. Pain Management:  Tylenol as needed 4. Mood:  provide emotional support             -antipsychotic agents:  none 5. Neuropsych: This patient is capable of making decisions on  her own behalf. 6. Skin/Wound Care:  Routine skin checks 7. Fluids/Electrolytes/Nutrition:  Routine ins and outs with follow-up chemistries 8. Seizure disorder. Keppra 1500 mg twice a day, Lyrica 200 mg 3 times a day. Pt off phenobarbital and dilantin             - Follow-up neurology services as outpt             -monitor closely for further seizure activity             -keep HOB at 30 degrees 9.Diabetes mellitus with peripheral neuropathy. Hemoglobin A1c 14.9. Lantus insulin 10 units daily. Check blood sugars before meals and at bedtime. Diabetic teaching 10.  Dysphagia. Diet advanced to mechanical soft. 11. Asthma with remote history of tobacco abuse. Continue nebulizers 12. Morbid obesity. BMI 43.44. Dietary follow-up 13.CKD stage III. Follow-up chemistries       Elizabeth Sauer 05/14/2018  Post Admission Physician Evaluation: 1. Functional deficits secondary  to status elepticus/encephalopathy. 2. Patient is admitted to receive collaborative, interdisciplinary care between the physiatrist, rehab nursing staff, and therapy team. 3. Patient's level of medical complexity and substantial therapy needs in context of that medical necessity cannot be provided at a lesser intensity of care such as a SNF. 4. Patient has experienced substantial functional loss from his/her baseline which was documented above under the "Functional History" and "Functional Status" headings.  Judging by the patient's diagnosis, physical exam, and functional history, the patient has potential for functional progress which will result in measurable gains while on inpatient rehab.  These gains will be of substantial and practical use upon discharge  in facilitating mobility and self-care at the household level. 5. Physiatrist will provide 24 hour management of medical needs as well as oversight of the therapy plan/treatment and provide guidance as appropriate regarding the interaction of the two. 6. The Preadmission Screening has been reviewed and patient status is unchanged unless otherwise stated above. 7. 24 hour rehab nursing will assist with bladder management, bowel management, safety, skin/wound care, disease management, medication administration, pain management and patient education  and help integrate therapy concepts, techniques,education, etc. 8. PT will assess and treat for/with: Lower extremity strength, range of motion, stamina, balance, functional mobility, safety, adaptive techniques and equipment, cognitive perceptual rx, NMR, community reentry.   Goals are:  mod I . 9. OT will assess and treat for/with: ADL's, functional mobility, safety, upper extremity strength, adaptive techniques and equipment, NMR, cognitive perceptual rx.   Goals are: mod I. Therapy may proceed with showering this patient. 10. SLP will assess and treat for/with: cognition, communication.  Goals are: mod I. 11. Case Management and Social Worker will assess and treat for psychological issues and discharge planning. 12. Team conference will be held weekly to assess progress toward goals and to determine barriers to discharge. 13. Patient will receive at least 3 hours of therapy per day at least 5 days per week. 14. ELOS: 7-12 days       15. Prognosis:  excellent   I have personally performed a face to face diagnostic evaluation of this patient and formulated the key components of the plan.  Additionally, I have personally reviewed laboratory data, imaging studies, as well as relevant notes and concur with the physician assistant's documentation above.  Meredith Staggers, MD, Mellody Drown

## 2018-05-14 NOTE — Progress Notes (Addendum)
Subjective: Patient denies further seizure like activity. She still feels weak overall but is willing to work with therapy to regain her strength.  RN also states no seizure like activities noted.  She did have a fall early this morning due to weakness when trying to use bedside commode - she denies focal pain and states she slid to her knees after losing balance.  Objective: Current vital signs: BP 101/69 (BP Location: Left Arm)   Pulse 88   Temp 97.7 F (36.5 C) (Oral)   Resp 18   Ht 5\' 5"  (1.651 m)   Wt 116.9 kg   LMP 11/26/2014 (Exact Date)   SpO2 95%   BMI 42.89 kg/m  Vital signs in last 24 hours: Temp:  [97.6 F (36.4 C)-98.9 F (37.2 C)] 97.7 F (36.5 C) (03/13 0757) Pulse Rate:  [88-103] 88 (03/13 0757) Resp:  [18-20] 18 (03/13 0757) BP: (101-123)/(69-84) 101/69 (03/13 0757) SpO2:  [95 %-99 %] 95 % (03/13 0757) Weight:  [116.9 kg-117.5 kg] 116.9 kg (03/13 0500)  Intake/Output from previous day: 03/12 0701 - 03/13 0700 In: 855.9 [P.O.:480; I.V.:0.9; NG/GT:275; IV Piggyback:100] Out: 1200 [Urine:1200] Intake/Output this shift: No intake/output data recorded. Nutritional status:  Diet Order            DIET DYS 3 Room service appropriate? Yes with Assist; Fluid consistency: Thin  Diet effective now              Neurologic Exam: Neurological Examination Mental Status: Alert, oriented, thought content appropriate. Cranial Nerves: II: Visual fields grossly normal,  III,IV, VI: ptosis not present, extra-ocular motions intact bilaterally pupils equal, round, reactive to light and accommodation. Bilateral nystagmus.  V,VII: smile symmetric, facial light touch sensation normal bilaterally VIII: hearing normal bilaterally IX,X: palate rises symmetrically XI: bilateral shoulder shrug XII: midline tongue extension without fasciculations  Motor: Right : Upper extremity   5/5  Lower extremity   5/5     Left:     Upper extremity   5/5  Lower extremity    5/5     Tone and bulk:normal tone throughout; no atrophy noted Sensory: light touch intact throughout, bilaterally Deep Tendon Reflexes: 2+ and symmetric throughout  Lab Results: Results for orders placed or performed during the hospital encounter of 05/06/18 (from the past 48 hour(s))  Glucose, capillary     Status: Abnormal   Collection Time: 05/12/18 12:08 PM  Result Value Ref Range   Glucose-Capillary 104 (H) 70 - 99 mg/dL   Comment 1 Notify RN    Comment 2 Document in Chart   Glucose, capillary     Status: Abnormal   Collection Time: 05/12/18  4:31 PM  Result Value Ref Range   Glucose-Capillary 66 (L) 70 - 99 mg/dL  Glucose, capillary     Status: Abnormal   Collection Time: 05/12/18  6:02 PM  Result Value Ref Range   Glucose-Capillary 107 (H) 70 - 99 mg/dL  Glucose, capillary     Status: Abnormal   Collection Time: 05/12/18  7:34 PM  Result Value Ref Range   Glucose-Capillary 119 (H) 70 - 99 mg/dL   Comment 1 Notify RN    Comment 2 Document in Chart   Glucose, capillary     Status: Abnormal   Collection Time: 05/12/18 11:12 PM  Result Value Ref Range   Glucose-Capillary 117 (H) 70 - 99 mg/dL   Comment 1 Notify RN    Comment 2 Document in Chart   Glucose, capillary  Status: Abnormal   Collection Time: 05/13/18  3:48 AM  Result Value Ref Range   Glucose-Capillary 201 (H) 70 - 99 mg/dL   Comment 1 Notify RN    Comment 2 Document in Chart   Basic metabolic panel     Status: Abnormal   Collection Time: 05/13/18  4:02 AM  Result Value Ref Range   Sodium 134 (L) 135 - 145 mmol/L   Potassium 3.8 3.5 - 5.1 mmol/L   Chloride 101 98 - 111 mmol/L   CO2 25 22 - 32 mmol/L   Glucose, Bld 197 (H) 70 - 99 mg/dL   BUN 18 6 - 20 mg/dL   Creatinine, Ser 1.45 (H) 0.44 - 1.00 mg/dL   Calcium 8.8 (L) 8.9 - 10.3 mg/dL   GFR calc non Af Amer 42 (L) >60 mL/min   GFR calc Af Amer 49 (L) >60 mL/min   Anion gap 8 5 - 15    Comment: Performed at [facility] [address], [place], [place] [address]  Glucose, capillary     Status: Abnormal   Collection Time: 05/13/18 10:07 AM  Result Value Ref Range   Glucose-Capillary 173 (H) 70 - 99 mg/dL  Glucose, capillary     Status: Abnormal   Collection Time: 05/13/18 12:48 PM  Result Value Ref Range   Glucose-Capillary 128 (H) 70 - 99 mg/dL  Glucose, capillary     Status: Abnormal   Collection Time: 05/13/18  4:21 PM  Result Value Ref Range   Glucose-Capillary 113 (H) 70 - 99 mg/dL  Glucose, capillary     Status: Abnormal   Collection Time: 05/13/18  8:51 PM  Result Value Ref Range   Glucose-Capillary 156 (H) 70 - 99 mg/dL  Glucose, capillary     Status: Abnormal   Collection Time: 05/14/18  2:33 AM  Result Value Ref Range   Glucose-Capillary 139 (H) 70 - 99 mg/dL   Comment 1 Notify RN   Glucose, capillary     Status: Abnormal   Collection Time: 05/14/18  4:23 AM  Result Value Ref Range   Glucose-Capillary 156 (H) 70 - 99 mg/dL   Comment 1 Notify RN     Recent Results (from the past 240 hour(s))  Culture, blood (Routine X 2) w Reflex to ID Panel     Status: None   Collection Time: 05/07/18  1:40 AM  Result Value Ref Range Status   Specimen Description   Final    BLOOD RIGHT ANTECUBITAL Performed at [facility], [place] [address] [place], [place] [address]    Special Requests   Final    BOTTLES DRAWN AEROBIC AND ANAEROBIC Blood Culture adequate volume Performed at [facility] [address] [place], [place] [address]    Culture   Final    NO GROWTH 5 DAYS Performed at [facility], [place] [address] [place], [place] [address]    Report Status 05/12/2018 FINAL  Final  Culture, blood (Routine X 2) w Reflex to ID Panel     Status: None   Collection Time: 05/07/18  3:31 AM  Result Value Ref Range Status   Specimen Description   Final    BLOOD LEFT HAND Performed at [facility], [place] [address], [place],  [address]    Special Requests   Final     BOTTLES DRAWN AEROBIC AND ANAEROBIC Blood Culture adequate volume Performed at [facility]  [address] [place], [place] [address]    Culture   Final    NO GROWTH 5 DAYS Performed at [facility], [place] [address] [place], [place] [address]    Report Status 05/12/2018 FINAL  Final  MRSA PCR Screening     Status: None   Collection Time: 05/09/18 12:45 PM  Result Value Ref Range Status   MRSA by PCR NEGATIVE NEGATIVE Final    Comment:        The GeneXpert MRSA Assay (FDA approved for NASAL specimens only), is one component of a comprehensive MRSA colonization surveillance program. It is not intended to diagnose MRSA infection nor to guide or monitor treatment for MRSA infections. Performed at [facility], [place] [address] [place], [place] [address]     Lipid Panel No results for input(s): CHOL, TRIG, HDL, CHOLHDL, VLDL, LDLCALC in the last 72 hours.  Studies/Results: Dg Swallowing Func-speech Pathology  Result Date: 05/13/2018 Objective Swallowing Evaluation: Type of Study: MBS-Modified Barium Swallow Study  Patient Details Name: Rebecca Johnston MRN: [id number] Date of Birth: [birth year] Today's Date: 05/13/2018 Time: SLP Start Time (ACUTE ONLY): 5631 -SLP Stop Time (ACUTE ONLY): 1505 SLP Time Calculation (min) (ACUTE ONLY): 20 min Past Medical History: Past Medical History: Diagnosis Date . Anemia  . Asthma  . Diabetes mellitus without complication ([place]) 49/7026 . Fibroids  . GERD (gastroesophageal reflux disease)  . History of blood transfusion Feb 2015 . Obesity, Class III, BMI 40-49.9 (morbid obesity) ([facility])  . Shortness of breath dyspnea  Past Surgical History: Past Surgical History: Procedure Laterality Date . ABDOMINAL HYSTERECTOMY N/A 12/08/2014  Procedure: HYSTERECTOMY ABDOMINAL;  Surgeon: Woodroe Mode, MD;  Location: [facility];  Service: Gynecology;  Laterality: N/A; . DILATION AND CURETTAGE OF UTERUS   . DILITATION & CURRETTAGE/HYSTROSCOPY WITH  VERSAPOINT RESECTION N/A 01/06/2014  Procedure: Corena Pilgrim WITH VERSAPOINT;  Surgeon: Woodroe Mode, MD;  Location: [facility];  Service: Gynecology;  Laterality: N/A; . LAPAROSCOPIC LYSIS OF ADHESIONS N/A 12/08/2014  Procedure: LAPAROSCOPIC LYSIS OF ADHESIONS;  Surgeon: Michael Boston, MD;  Location: [facility];  Service: General;  Laterality: N/A; . OVARIAN CYST REMOVAL    cyst not removed. Cyst drained . SUPRA-UMBILICAL HERNIA N/A 37/10/5883  Procedure: LAPARSCOPIC SUPRA-UMBILICAL HERNIA REPAIR ;  Surgeon: Michael Boston, MD;  Location: [facility];  Service: General;  Laterality: N/A; . VENTRAL HERNIA REPAIR N/A 12/08/2014  Procedure: LAPAROSCOPIC INCARCERATED INCISIONAL VENTRAL WALL HERNIA REPAIR;  Surgeon: Michael Boston, MD;  Location: [facility];  Service: General;  Laterality: N/A; HPI: Pt is a 48 y.o. female with medical history significant of diabetes mellitus, asthma, cardiac, obesity, CKD stage III, who presented with headache, blurry vision, dizziness. Patient stated that she has been having headaches for more than 1 week in the right temporal area, constant, 8 out of 10 severity, nonradiating. She also reported right leg weakness, slurred speech, and facial droop. CT of the head and MRI were negative for acute pathology. Pt was found obtunded only responsive to pain on 3/8 and was intubated from 05/09/18 to 05/11/18.  EEG 3/6 right temporal complex partial seizure. EEG 3/10 showed evidence of an improving diffuse encephalopathy. Chest x-ray of 05/11/18 revealed lower lobe atelectatic change bilaterally but a degree of superimposed pneumonia in these areas could not be excluded entirely.  No data recorded Assessment / Plan / Recommendation CHL IP CLINICAL IMPRESSIONS 05/13/2018 Clinical Impression Pt presents with oropharyngeal dysphagia characterized by impaired A-P transport, impaired bolus control, a pharyngeal delay, and reduced laryngeal  excursion. She demonstrated lingual pumping, premature spillage to the valleculae, and  penetration (PAS 3) of thin liquids when consecutive swallows were used. Penetration was eliminated with use of individual sips and no pharyngeal residue or instances of aspiration were demontrated. It is recommended that a dysphagia 3 diet with thin liquids be initiated at this time with obervance of swallowing precautions. SLP will follow to ensure tolerance of the recommended diet and for potential upgrade to regular consistency. SLP Visit Diagnosis Dysphagia, oropharyngeal phase (R13.12) Attention and concentration deficit following -- Frontal lobe and executive function deficit following -- Impact on safety and function Mild aspiration risk   CHL IP TREATMENT RECOMMENDATION 05/13/2018 Treatment Recommendations Therapy as outlined in treatment plan below   Prognosis 05/13/2018 Prognosis for Safe Diet Advancement Good Barriers to Reach Goals Cognitive deficits Barriers/Prognosis Comment -- CHL IP DIET RECOMMENDATION 05/13/2018 SLP Diet Recommendations Dysphagia 3 (Mech soft) solids;Thin liquid Liquid Administration via Straw;Cup Medication Administration Whole meds with liquid Compensations Slow rate;Small sips/bites;Other (Comment) Postural Changes Seated upright at 90 degrees   CHL IP OTHER RECOMMENDATIONS 05/13/2018 Recommended Consults -- Oral Care Recommendations Oral care BID Other Recommendations --   CHL IP FOLLOW UP RECOMMENDATIONS 05/13/2018 Follow up Recommendations Inpatient Rehab   CHL IP FREQUENCY AND DURATION 05/13/2018 Speech Therapy Frequency (ACUTE ONLY) min 2x/week Treatment Duration 2 weeks      CHL IP ORAL PHASE 05/13/2018 Oral Phase Impaired Oral - Pudding Teaspoon -- Oral - Pudding Cup -- Oral - Honey Teaspoon -- Oral - Honey Cup -- Oral - Nectar Teaspoon -- Oral - Nectar Cup -- Oral - Nectar Straw -- Oral - Thin Teaspoon -- Oral - Thin Cup Lingual pumping;Premature spillage Oral - Thin Straw Lingual pumping;Premature spillage Oral - Puree Lingual pumping;Premature spillage Oral - Mech Soft  Lingual pumping Oral - Regular Lingual pumping Oral - Multi-Consistency -- Oral - Pill Lingual pumping;Premature spillage Oral Phase - Comment --  CHL IP PHARYNGEAL PHASE 05/13/2018 Pharyngeal Phase Impaired Pharyngeal- Pudding Teaspoon -- Pharyngeal -- Pharyngeal- Pudding Cup -- Pharyngeal -- Pharyngeal- Honey Teaspoon -- Pharyngeal -- Pharyngeal- Honey Cup -- Pharyngeal -- Pharyngeal- Nectar Teaspoon -- Pharyngeal -- Pharyngeal- Nectar Cup -- Pharyngeal -- Pharyngeal- Nectar Straw -- Pharyngeal -- Pharyngeal- Thin Teaspoon -- Pharyngeal -- Pharyngeal- Thin Cup Delayed swallow initiation-vallecula;Reduced anterior laryngeal mobility Pharyngeal -- Pharyngeal- Thin Straw Delayed swallow initiation-vallecula;Reduced anterior laryngeal mobility;Penetration/Aspiration during swallow;Penetration/Aspiration before swallow Pharyngeal Material enters airway, remains ABOVE vocal cords and not ejected out Pharyngeal- Puree Delayed swallow initiation-vallecula;Reduced anterior laryngeal mobility Pharyngeal -- Pharyngeal- Mechanical Soft Delayed swallow initiation-vallecula;Reduced anterior laryngeal mobility Pharyngeal -- Pharyngeal- Regular Delayed swallow initiation-vallecula;Reduced anterior laryngeal mobility Pharyngeal -- Pharyngeal- Multi-consistency -- Pharyngeal -- Pharyngeal- Pill Delayed swallow initiation-vallecula;Reduced anterior laryngeal mobility Pharyngeal -- Pharyngeal Comment --  CHL IP CERVICAL ESOPHAGEAL PHASE 05/13/2018 Cervical Esophageal Phase WFL Pudding Teaspoon -- Pudding Cup -- Honey Teaspoon -- Honey Cup -- Nectar Teaspoon -- Nectar Cup -- Nectar Straw -- Thin Teaspoon -- Thin Cup -- Thin Straw -- Puree -- Mechanical Soft -- Regular -- Multi-consistency -- Pill -- Cervical Esophageal Comment -- Shanika I. Hardin Negus, [place], [place] Office number [phone] Pager [place] 05/13/2018, 3:45 PM               Medications:   Current Facility-Administered  Medications (Endocrine & Metabolic):  .  insulin aspart (novoLOG) injection 0-9 Units .  insulin glargine (LANTUS) injection 10 Units   Current Facility-Administered Medications (Cardiovascular):  .  hydrALAZINE (APRESOLINE) injection  5 mg   Current Facility-Administered Medications (Respiratory):  .  fluticasone (FLONASE) 50 MCG/ACT nasal spray 2 spray .  levalbuterol (XOPENEX) nebulizer solution 0.63 mg   Current Facility-Administered Medications (Analgesics):  .  [DISCONTINUED] acetaminophen (TYLENOL) tablet 650 mg **OR** acetaminophen (TYLENOL) suppository 650 mg .  acetaminophen (TYLENOL) tablet 650 mg   Current Facility-Administered Medications (Hematological):  .  enoxaparin (LOVENOX) injection 60 mg   Current Facility-Administered Medications (Other):  .  0.9 %  sodium chloride infusion .  levETIRAcetam (KEPPRA) IVPB 1500 mg/ 100 mL premix .  LORazepam (ATIVAN) injection 1 mg .  MEDLINE mouth rinse .  [DISCONTINUED] ondansetron (ZOFRAN) tablet 4 mg **OR** ondansetron (ZOFRAN) injection 4 mg .  pregabalin (LYRICA) capsule 200 mg  No current outpatient medications on file.  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  1. Seizure thought to be precipitated by severe hyperglycemia. MRI reveals subtle T2 hypodensity in right occipital lobe which would be consistent with seizures precipitated by severe hyperglycemia. 2. No recurrence of seizure like activity after tapering off phenobarbital and dilantin.   Recommendations: 1. Continue Lyrica 200mg  TID. Can taper Keppra to 1000 mg BID starting now - changing to PO formulation now 2. Continue working with therapy, looks like she will go to [facility] for acute rehab 3. F/u with outpatient neurology, will likely be able to be tapered off of AEDs as an outpatient given no structural focus for seizures, which were most likely precipitated by severe  hyperglycemia. 4. Neurology will sign off for now.  Please call if there are additional questions.    LOS: 7 days   Alphonzo Grieve, MD  PGY3 05/14/2018  9:08 AM   Electronically signed: Dr. Kerney Elbe

## 2018-05-15 ENCOUNTER — Inpatient Hospital Stay (HOSPITAL_COMMUNITY): Payer: Medicaid Other | Admitting: Occupational Therapy

## 2018-05-15 ENCOUNTER — Inpatient Hospital Stay (HOSPITAL_COMMUNITY): Payer: Medicaid Other | Admitting: Physical Therapy

## 2018-05-15 ENCOUNTER — Inpatient Hospital Stay (HOSPITAL_COMMUNITY): Payer: Medicaid Other | Admitting: Speech Pathology

## 2018-05-15 DIAGNOSIS — G40909 Epilepsy, unspecified, not intractable, without status epilepticus: Secondary | ICD-10-CM

## 2018-05-15 LAB — GLUCOSE, CAPILLARY
Glucose-Capillary: 124 mg/dL — ABNORMAL HIGH (ref 70–99)
Glucose-Capillary: 139 mg/dL — ABNORMAL HIGH (ref 70–99)
Glucose-Capillary: 174 mg/dL — ABNORMAL HIGH (ref 70–99)
Glucose-Capillary: 187 mg/dL — ABNORMAL HIGH (ref 70–99)

## 2018-05-15 NOTE — Evaluation (Addendum)
Occupational Therapy Assessment and Plan  Patient Details  Name: Rebecca Johnston MRN: [id number] Date of Birth: 11/14/1970  OT Diagnosis: abnormal posture, ataxia, cognitive deficits and muscle weakness (generalized) Rehab Potential: Rehab Potential (ACUTE ONLY): Good ELOS: 10-12 days   Today's Date: 05/15/2018 OT Individual Time: 2694-8546 OT Individual Time Calculation (min): 73 min     Problem List:  Patient Active Problem List   Diagnosis Date Noted  . Seizure disorder ([place]) 05/14/2018  . Status epilepticus ([place])   . Headache 05/07/2018  . Lobar pneumonia ([place]) 05/07/2018  . GERD (gastroesophageal reflux disease) 05/07/2018  . Hyponatremia 05/07/2018  . HA (headache) 05/07/2018  . Hyperglycemia   . CKD (chronic kidney disease), stage III ([place]) 04/16/2017  . Neuropathy 04/04/2016  . Hx of iron deficiency anemia 12/21/2015  . Seasonal allergic rhinitis 12/21/2015  . Type II diabetes mellitus with renal manifestations ([place]) 12/21/2015  . Incarcerated hernia s/p lap repair w mesh 12/08/2014 12/08/2014  . Esophageal reflux 10/21/2013  . Abnormal CT scan, chest 09/02/2013    Past Medical History:  Past Medical History:  Diagnosis Date  . Anemia   . Asthma   . Diabetes mellitus without complication ([place]) 27/0350  . Fibroids   . GERD (gastroesophageal reflux disease)   . History of blood transfusion Feb 2015  . Obesity, Class III, BMI 40-49.9 (morbid obesity) ([place])   . Shortness of breath dyspnea    Past Surgical History:  Past Surgical History:  Procedure Laterality Date  . ABDOMINAL HYSTERECTOMY N/A 12/08/2014   Procedure: HYSTERECTOMY ABDOMINAL;  Surgeon: Woodroe Mode, MD;  Location: [facility];  Service: Gynecology;  Laterality: N/A;  . DILATION AND CURETTAGE OF UTERUS    . DILITATION & CURRETTAGE/HYSTROSCOPY WITH VERSAPOINT RESECTION N/A 01/06/2014   Procedure: Corena Pilgrim WITH VERSAPOINT;  Surgeon: Woodroe Mode, MD;  Location: [facility];  Service: Gynecology;   Laterality: N/A;  . LAPAROSCOPIC LYSIS OF ADHESIONS N/A 12/08/2014   Procedure: LAPAROSCOPIC LYSIS OF ADHESIONS;  Surgeon: Michael Boston, MD;  Location: [facility];  Service: General;  Laterality: N/A;  . OVARIAN CYST REMOVAL     cyst not removed. Cyst drained  . SUPRA-UMBILICAL HERNIA N/A 11/03/8180   Procedure: LAPARSCOPIC SUPRA-UMBILICAL HERNIA REPAIR ;  Surgeon: Michael Boston, MD;  Location: [facility];  Service: General;  Laterality: N/A;  . VENTRAL HERNIA REPAIR N/A 12/08/2014   Procedure: LAPAROSCOPIC INCARCERATED INCISIONAL VENTRAL WALL HERNIA REPAIR;  Surgeon: Michael Boston, MD;  Location: [facility];  Service: General;  Laterality: N/A;    Assessment & Plan Clinical Impression:  Rebecca Johnston is a 48 year old right-handed female with history of diabetes mellitus maintained on Amaryl 2 mg daily, asthma quit smoking 2 years ago, CKDstage III,GERD, morbid obesity with BMI 43.4. Per chart review patient lives with her children ages 21 and 13(Pt told me 36 and 10). Independent prior to admission still working and drives.She has a mother in the area who can provide intermittent assistance.Presented 05/06/2018 to [facility] with headache, blurred vision and dizziness 1 week. Headache located right temporal area. Patient did note some slurred speech and facial droop as well as occasional nausea with episode of vomiting. Noted recently community-acquired pneumonia 04/29/2018 with persistent cough nasal congestion she was placed on doxycycline by EDP.Marland Kitchen CT/MRI showed no acute stroke or mass effect. CT angiogram of head and neck. Urine drug screen negative. She had elevated glucose on admission of 355 bicarbonate low at 18 hyponatremic 127. She did receive Reglan and Compazine for nausea  and later that day noted neck deviation with possible eye deviation as well suspect seizure. She was loaded initially with Dilantin changed to Keppra and Lyrica. EEG showed 2 electrographic seizures emanating out of the right  temporal region and spreading mostly to the entire right side. Her Keppra was changed to 1500 mg every 12 hours.Neurology feels seizures caused by severe hyperglycemia.Subcutaneous Lovenox has been added for DVT prophylaxis. Patient nothing by mouth with nasogastric tube feeds for nutritional support followed by speech therapy untilfollow-up modified barium swallow 05/13/2018 cleared her formechanical soft thin liquid diet initiated. Patient didrequireintubation 05/09/2018 05/11/2018. Bouts of hypotension requiring nor-epi. Therapy evaluations completed with recommendations of physical medicine rehabilitation consult. Patient was admitted for a comprehensive rehabilitation program.  Patient currently requires min with basic self-care skills secondary to muscle weakness, decreased cardiorespiratoy endurance, blurry vision, decreased attention, decreased awareness, decreased problem solving and decreased safety awareness and decreased standing balance, decreased postural control and decreased balance strategies.  Prior to hospitalization, patient could complete BADLs with independent .  Patient will benefit from skilled intervention to increase independence with basic self-care skills prior to discharge home with family support.  Anticipate patient will require 24 hour supervision and follow up home health.  OT - End of Session Endurance Deficit: Yes OT Assessment Rehab Potential (ACUTE ONLY): Good OT Barriers to Discharge: Decreased caregiver support;Medical stability;Home environment access/layout(10 steps to enter 2nd level apartment) OT Patient demonstrates impairments in the following area(s): Balance;Vision;Behavior;Cognition;Safety;Endurance;Motor OT Basic ADL's Functional Problem(s): Grooming;Bathing;Dressing;Toileting OT Advanced ADL's Functional Problem(s): Simple Meal Preparation;Light Housekeeping OT Transfers Functional Problem(s): Toilet;Tub/Shower OT Additional Impairment(s):  None OT Plan OT Intensity: Minimum of 1-2 x/day, 45 to 90 minutes OT Frequency: 5 out of 7 days OT Duration/Estimated Length of Stay: 10-12 days OT Treatment/Interventions: Balance/vestibular training;Community reintegration;Disease mangement/prevention;Neuromuscular re-education;Patient/family education;Self Care/advanced ADL retraining;Therapeutic Exercise;UE/LE Coordination activities;Wheelchair propulsion/positioning;Visual/perceptual remediation/compensation;UE/LE Strength taining/ROM;Therapeutic Activities;Psychosocial support;Pain management;Functional mobility training;DME/adaptive equipment instruction;Discharge planning;Cognitive remediation/compensation OT Self Feeding Anticipated Outcome(s): No goal OT Basic Self-Care Anticipated Outcome(s): Supervision/cuing  OT Toileting Anticipated Outcome(s): Supervision/cuing  OT Bathroom Transfers Anticipated Outcome(s): Supervision/cuing  OT Recommendation Patient destination: Home Follow Up Recommendations: Home health OT Equipment Recommended: To be determined   Skilled Therapeutic Intervention Skilled OT session completed with focus on initial evaluation, education on OT role/POC, and establishment of patient-centered goals.   Pt greeted in bed, on bedpan with overflow of urine and soaked sheets. Supine<sit completed with steady assist and use of bedrails. Stand pivot<w/c completed with Min A to proceed with bathing OOB. Pt completed bathing (w/c level at sink, sit<stand), dressing (EOB, sit>stand without device), grooming/oral care tasks (standing at sink), and ambulatory simulated toilet transfer with RW and Min A during session. Pt was slow to speak, but followed commands consistently. Difficultly with reading labels on ADL items due to have special reading glasses (the ones she brought to CIR are for distance vision). Pt reports increased blurriness with vision, unable to read patient information on wall board or her wall clock. Increased  time for locating transfer surfaces as well. Min A sit<stand at sink during perihygiene completion and also without device to elevate pants over hips near EOB. Pts pants were tied too tightly, and she had 2 LOBs pulling them up needing Mod A to correct. Vcs required for problem solving to sit and untie pants before continuing to pull them up. Short distance ambulatory transfer completed sink<bed without device with Min-Mod A with LE ataxia noted. Vcs required for upright posture when standing at sink, with pt  often standing bent over sink with one leg far forward. Intermittent buckling noted as well. At end of session pt was left in recliner with all needs within reach. Safety belt fastened.    OT Evaluation Precautions/Restrictions  Precautions Precautions: Fall Restrictions Weight Bearing Restrictions: No General Chart Reviewed: Yes Family/Caregiver Present: No Pain: No c/o pain during tx  Pain Assessment Pain Scale: 0-10 Pain Score: 0-No pain Home Living/Prior Functioning Home Living Family/patient expects to be discharged to:: Private residence Living Arrangements: Children Available Help at Discharge: Family, Available PRN/intermittently(Mother can assist PRN, but also works) Type of Home: House Home Access: Stairs to enter [employer] of Steps: 10 steps to ascend to 2nd floor apartment  Home Layout: One level Bathroom Shower/Tub: Chiropodist: Standard Bathroom Accessibility: Yes  Lives With: Daughter(13 y/o) IADL History Homemaking Responsibilities: Yes Meal Prep Responsibility: Primary Laundry Responsibility: Primary Cleaning Responsibility: Primary Bill Paying/Finance Responsibility: Primary Shopping Responsibility: Primary Child Care Responsibility: Primary Homemaking Comments: Pt was also caring for her 2 dogs Occupation: Full time employment Type of Occupation: Worked in Engineer, 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  Leisure and  Hobbies: Spending time with her dogs and dtr  IADL Comments: Per pt, Independent with ADL/IADL without device use Prior Function Level of Independence: Independent with basic ADLs, Independent with homemaking with ambulation, Independent with transfers  Able to Take Stairs?: Yes Driving: Yes Vocation: Full time employment ADL ADL Eating: Not assessed Grooming: Minimal assistance Where Assessed-Grooming: Standing at sink Upper Body Bathing: Supervision/safety Where Assessed-Upper Body Bathing: Sitting at sink Lower Body Bathing: Minimal assistance Where Assessed-Lower Body Bathing: Sitting at sink, Standing at sink Upper Body Dressing: Supervision/safety Where Assessed-Upper Body Dressing: Edge of bed Lower Body Dressing: Minimal assistance Where Assessed-Lower Body Dressing: Edge of bed Toileting: Not assessed Toilet Transfer: Minimal assistance Toilet Transfer Method: Counselling psychologist: Grab bars Tub/Shower Transfer: Not assessed Vision Baseline Vision/History: Wears glasses Wears Glasses: At all times(one pair for reading, and one pair for distance) Patient Visual Report: Diplopia;Blurring of vision Vision Assessment?: Vision impaired- to be further tested in functional context(Unable to read her wall clock or patient information board. Increased difficultly with reading small print on ADL items) Perception  Perception: Within Functional Limits Praxis Praxis: Intact Cognition Overall Cognitive Status: Impaired/Different from baseline Arousal/Alertness: Awake/alert Orientation Level: Person;Place;Situation Person: Oriented Place: Oriented Situation: Oriented Year: 2020 Day of Week: Correct Memory: Impaired Memory Impairment: Decreased recall of new information;Decreased short term memory;Retrieval deficit Decreased Short Term Memory: Verbal complex;Functional complex Immediate Memory Recall: Sock;Blue;Bed Memory Recall: Sock;Blue;Bed Memory Recall  Sock: Without Cue Memory Recall Blue: Without Cue Memory Recall Bed: Without Cue Attention: Sustained Sustained Attention: Impaired(often stopped in the middle of an ADL task to do something else) Sustained Attention Impairment: Verbal complex;Functional complex Awareness: Impaired Problem Solving: Impaired Behaviors: Impulsive Safety/Judgment: Impaired Comments: Needed vcs for safe action when losing balance during tx and also when problem solving for safety Sensation Sensation Light Touch: Appears Intact Coordination Gross Motor Movements are Fluid and Coordinated: No Fine Motor Movements are Fluid and Coordinated: Yes Coordination and Movement Description: Ataxic LEs, most prevalant without device use  Finger Nose Finger Test: Slight dysmetria Lt compared to Rt Motor  Motor Motor: Ataxia  Trunk/Postural Assessment  Cervical Assessment Cervical Assessment: Exceptions to WFL(forward head) Thoracic Assessment Thoracic Assessment: Exceptions to WFL(rounded shoulders) Lumbar Assessment Lumbar Assessment: Exceptions to WFL(posterior pelvic tilt) Postural Control Postural Control: Deficits on evaluation(Mod A needed to recover LOBs)  Balance Balance Balance Assessed:  Yes Dynamic Sitting Balance Dynamic Sitting - Level of Assistance: 5: Stand by assistance(donning LB garments EOB) Dynamic Standing Balance Dynamic Standing - Level of Assistance: 4: Min assist(elevating LB garments over hips) Extremity/Trunk Assessment RUE Assessment RUE Assessment: Within Functional Limits Active Range of Motion (AROM) Comments: AROM WNL General Strength Comments: 4+/5 proximal to distal  LUE Assessment LUE Assessment: Within Functional Limits Active Range of Motion (AROM) Comments: AROM WNL General Strength Comments: 4/5 proximal to distal     Refer to Care Plan for Long Term Goals  Recommendations for other services: None    Discharge Criteria: Patient will be discharged from OT if  patient refuses treatment 3 consecutive times without medical reason, if treatment goals not met, if there is a change in medical status, if patient makes no progress towards goals or if patient is discharged from hospital.  The above assessment, treatment plan, treatment alternatives and goals were discussed and mutually agreed upon: by patient  Skeet Simmer 05/15/2018, 12:53 PM

## 2018-05-15 NOTE — Evaluation (Signed)
Speech Language Pathology Assessment and Plan  Patient Details  Name: Rebecca Johnston MRN: [id number] Date of Birth: 1970/09/29  SLP Diagnosis: Dysphagia;Speech and Language deficits;Cognitive Impairments  Rehab Potential: Excellent ELOS: 10 to 12 days    Today's Date: 05/15/2018 SLP Individual Time: 0900-1000 SLP Individual Time Calculation (min): 60 min   Problem List:  Patient Active Problem List   Diagnosis Date Noted  . Seizure disorder ([place]) 05/14/2018  . Status epilepticus ([place])   . Headache 05/07/2018  . Lobar pneumonia ([place]) 05/07/2018  . GERD (gastroesophageal reflux disease) 05/07/2018  . Hyponatremia 05/07/2018  . HA (headache) 05/07/2018  . Hyperglycemia   . CKD (chronic kidney disease), stage III ([place]) 04/16/2017  . Neuropathy 04/04/2016  . Hx of iron deficiency anemia 12/21/2015  . Seasonal allergic rhinitis 12/21/2015  . Type II diabetes mellitus with renal manifestations ([place]) 12/21/2015  . Incarcerated hernia s/p lap repair w mesh 12/08/2014 12/08/2014  . Esophageal reflux 10/21/2013  . Abnormal CT scan, chest 09/02/2013   Past Medical History:  Past Medical History:  Diagnosis Date  . Anemia   . Asthma   . Diabetes mellitus without complication ([place]) 03/7508  . Fibroids   . GERD (gastroesophageal reflux disease)   . History of blood transfusion Feb 2015  . Obesity, Class III, BMI 40-49.9 (morbid obesity) ([place])   . Shortness of breath dyspnea    Past Surgical History:  Past Surgical History:  Procedure Laterality Date  . ABDOMINAL HYSTERECTOMY N/A 12/08/2014   Procedure: HYSTERECTOMY ABDOMINAL;  Surgeon: Woodroe Mode, MD;  Location: [facility];  Service: Gynecology;  Laterality: N/A;  . DILATION AND CURETTAGE OF UTERUS    . DILITATION & CURRETTAGE/HYSTROSCOPY WITH VERSAPOINT RESECTION N/A 01/06/2014   Procedure: Corena Pilgrim WITH VERSAPOINT;  Surgeon: Woodroe Mode, MD;  Location: [facility];  Service: Gynecology;  Laterality: N/A;  . LAPAROSCOPIC LYSIS  OF ADHESIONS N/A 12/08/2014   Procedure: LAPAROSCOPIC LYSIS OF ADHESIONS;  Surgeon: Michael Boston, MD;  Location: [facility];  Service: General;  Laterality: N/A;  . OVARIAN CYST REMOVAL     cyst not removed. Cyst drained  . SUPRA-UMBILICAL HERNIA N/A 25/10/5275   Procedure: LAPARSCOPIC SUPRA-UMBILICAL HERNIA REPAIR ;  Surgeon: Michael Boston, MD;  Location: [facility];  Service: General;  Laterality: N/A;  . VENTRAL HERNIA REPAIR N/A 12/08/2014   Procedure: LAPAROSCOPIC INCARCERATED INCISIONAL VENTRAL WALL HERNIA REPAIR;  Surgeon: Michael Boston, MD;  Location: [facility];  Service: General;  Laterality: N/A;    Assessment / Plan / Recommendation Clinical Impression Rebecca Johnston is a 48 year old right-handed female with history of diabetes mellitus, asthma, CKDstage III, GERD, morbid obesity.  Independent single mother to 4 and 15 y/o children who was still working and driving. Presented [facility]/06/2018 to [facility] with headache, blurred vision and dizziness 1 week. Headache located right temporal area. Patient did note some slurred speech and facial droop as well as occasional nausea with episode of vomiting. Noted recently community-acquired pneumonia 04/29/2018. CT/MRI and CT angiogram of head/neck negative. Urine drug screen negative. EEG showed 2 electrographic seizures emanating out of the right temporal region and spreading mostly to the entire right side. Neurology feels seizures caused by severe hyperglycemia. Subcutaneous Lovenox has been added for DVT prophylaxis. Pt intubated from 05/09/18 to 05/11/18, was NPO with NGT until modified barium swallow on 05/13/2018 .  Currently on regular diet with thin liquids. Therapy evaluations completed with recommendations of physical medicine rehabilitation consult. Patient was admitted for a comprehensive rehabilitation program  on 05/14/18.   Comprehensive cognitive linguistic evaluation completed on 05/15/18.  Pt presents with moderate cognitive impairments d/t  overall encephalopathy. She is oriented x4 with difficulty sustaining attention, decreased short-term memory, is able to follow one step commands with increased time, inconsistent in following multi-step directions, deficits in intellectual awareness, slow processing, difficulty with sequencing and reasoning. In general pt with flat affect and slow rate of speech. Her rate of speech is so slowly produced that listener's ability to comprehend thought content is reduced. In addition to the above mentioned deficits, pt also exhibits fluctuations in mentation c/b inconsistent confusion regarding age of her children, off-topic responses and difficulty with overall comprehension of instructions given within tasks. Bedside swallow of pt consuming regular diet with thin liquids was considered to be within functional limits, however pt would benefit from further up to ensure that pt's continues to consume diet safety especially with fluctuating mentation. As a result of the above mentioned defiicts pt requires skilled ST to increase functional independence and reduce caregiver burden. Anticipate that pt will require Min A for cognition at discharge with follow up ST services. Outpatient ST would be most beneficial.    Skilled Therapeutic Interventions          Skilled treatment session focused on competing the above mentioned assessments. Pt was able to write basic information and reports that she wears contact lenses. Her current eyeglass prescription is old and vision is mildly blurred when using glasses. Not related to neurological function. With Mod A cues, pt able to navigate information on schedule to recall information. Additionally, pt's speech is severely impacted by slow rate. As a result she is ~ 75% at the sentence level. She frequently seeks repetition of information throughout session.    SLP Assessment  Patient will need skilled Speech Lanaguage Pathology Services during CIR admission     Recommendations  SLP Diet Recommendations: Age appropriate regular solids;Thin Liquid Administration via: Cup;Straw Medication Administration: Whole meds with liquid Supervision: Patient able to self feed Compensations: Slow rate;Small sips/bites Postural Changes and/or Swallow Maneuvers: Seated upright 90 degrees Oral Care Recommendations: Oral care BID Recommendations for Other Services: Neuropsych consult Patient destination: Home Follow up Recommendations: 24 hour supervision/assistance;Outpatient SLP Equipment Recommended: None recommended by SLP    SLP Frequency 3 to 5 out of 7 days   SLP Duration  SLP Intensity  SLP Treatment/Interventions 10 to 12 days  Minumum of 1-2 x/day, 30 to 90 minutes  Cognitive remediation/compensation;Functional tasks;Patient/family education;Therapeutic Activities;Medication managment;Internal/external aids;Speech/Language facilitation    Pain Pain Assessment Pain Scale: 0-10 Pain Score: 0-No pain  Prior Functioning Cognitive/Linguistic Baseline: Within functional limits Type of Home: House  Lives With: Daughter(13 y/o) Available Help at Discharge: Family;Available PRN/intermittently(Mother can assist PRN, but also works) Vocation: Full time employment  Short Term Goals: Week 1: SLP Short Term Goal 1 (Week 1): Pt will utilize external memory aids to recall information in 8 out of 10 opportunities with Min A cues.  SLP Short Term Goal 2 (Week 1): Pt will complete semi-complex problem solving tasks with Mod A cues.  SLP Short Term Goal 3 (Week 1): Pt will demonstrate selective attention to task for ~ 15 minutes in mildly distracting environment with Mod A cues.  SLP Short Term Goal 4 (Week 1): Pt will demonstrate intellectual awareness by stating 3 physical and cognitive deficits that are related to acute condition in 8 out of 10 opportunities with Min A.  SLP Short Term Goal 5 (Week 1): Pt will utilize  speech intelligibility strategies to  increase speech intelligiiblity at the sentence level to ~ 90% with Min A cues.  SLP Short Term Goal 6 (Week 1): Pt will consume least restrictive diet with Mod I cues and minimal overt s/s of aspiration/dysphagia.   Refer to Care Plan for Long Term Goals  Recommendations for other services: Neuropsych  Discharge Criteria: Patient will be discharged from SLP if patient refuses treatment 3 consecutive times without medical reason, if treatment goals not met, if there is a change in medical status, if patient makes no progress towards goals or if patient is discharged from hospital.  The above assessment, treatment plan, treatment alternatives and goals were discussed and mutually agreed upon: by patient  Philis Doke 05/15/2018, 1:35 PM

## 2018-05-15 NOTE — Evaluation (Signed)
Physical Therapy Assessment and Plan  Patient Details  Name: Rebecca Johnston MRN: [id number] Date of Birth: [birth year]  PT Diagnosis: Abnormal posture, Abnormality of gait, Cognitive deficits, Coordination disorder, Difficulty walking and Impaired cognition Rehab Potential: Good ELOS: 10-12 days   Today's Date: 05/15/2018 PT Individual Time: 1400-1450 PT Individual Time Calculation (min): 50 min    Problem List:  Patient Active Problem List   Diagnosis Date Noted  . Seizure disorder ([place]) 05/14/2018  . Status epilepticus ([place])   . Headache 05/07/2018  . Lobar pneumonia ([place]) 05/07/2018  . GERD (gastroesophageal reflux disease) 05/07/2018  . Hyponatremia 05/07/2018  . HA (headache) 05/07/2018  . Hyperglycemia   . CKD (chronic kidney disease), stage III ([place]) 04/16/2017  . Neuropathy 04/04/2016  . Hx of iron deficiency anemia 12/21/2015  . Seasonal allergic rhinitis 12/21/2015  . Type II diabetes mellitus with renal manifestations ([place]) 12/21/2015  . Incarcerated hernia s/p lap repair w mesh 12/08/2014 12/08/2014  . Esophageal reflux 10/21/2013  . Abnormal CT scan, chest 09/02/2013    Past Medical History:  Past Medical History:  Diagnosis Date  . Anemia   . Asthma   . Diabetes mellitus without complication ([place]) 93/7902  . Fibroids   . GERD (gastroesophageal reflux disease)   . History of blood transfusion Feb 2015  . Obesity, Class III, BMI 40-49.9 (morbid obesity) ([place])   . Shortness of breath dyspnea    Past Surgical History:  Past Surgical History:  Procedure Laterality Date  . ABDOMINAL HYSTERECTOMY N/A 12/08/2014   Procedure: HYSTERECTOMY ABDOMINAL;  Surgeon: Woodroe Mode, MD;  Location: [facility];  Service: Gynecology;  Laterality: N/A;  . DILATION AND CURETTAGE OF UTERUS    . DILITATION & CURRETTAGE/HYSTROSCOPY WITH VERSAPOINT RESECTION N/A 01/06/2014   Procedure: Corena Pilgrim WITH VERSAPOINT;  Surgeon: Woodroe Mode, MD;  Location: [facility];  Service: Gynecology;   Laterality: N/A;  . LAPAROSCOPIC LYSIS OF ADHESIONS N/A 12/08/2014   Procedure: LAPAROSCOPIC LYSIS OF ADHESIONS;  Surgeon: Michael Boston, MD;  Location: [facility];  Service: General;  Laterality: N/A;  . OVARIAN CYST REMOVAL     cyst not removed. Cyst drained  . SUPRA-UMBILICAL HERNIA N/A 40/11/7351   Procedure: LAPARSCOPIC SUPRA-UMBILICAL HERNIA REPAIR ;  Surgeon: Michael Boston, MD;  Location: [facility];  Service: General;  Laterality: N/A;  . VENTRAL HERNIA REPAIR N/A 12/08/2014   Procedure: LAPAROSCOPIC INCARCERATED INCISIONAL VENTRAL WALL HERNIA REPAIR;  Surgeon: Michael Boston, MD;  Location: [facility];  Service: General;  Laterality: N/A;    Assessment & Plan Clinical Impression: Patient is a 48 year old right-handed female with history of diabetes mellitus maintained on Amaryl 2 mg daily, asthma quit smoking 2 years ago, CKDstage III,GERD, morbid obesity with BMI 43.4. Per chart review patient lives with her children ages 63 and 13(Pt told me 78 and 40). Independent prior to admission still working and drives.She has a mother in the area who can provide intermittent assistance.Presented 05/06/2018 to [facility] with headache, blurred vision and dizziness 1 week. Headache located right temporal area. Patient did note some slurred speech and facial droop as well as occasional nausea with episode of vomiting. Noted recently community-acquired pneumonia 04/29/2018 with persistent cough nasal congestion she was placed on doxycycline by EDP.Marland Kitchen CT/MRI showed no acute stroke or mass effect. CT angiogram of head and neck. Urine drug screen negative. She had elevated glucose on admission of 355 bicarbonate low at 18 hyponatremic 127. She did receive Reglan and Compazine for nausea and later that  day noted neck deviation with possible eye deviation as well suspect seizure. She was loaded initially with Dilantin changed to Keppra and Lyrica. EEG showed 2 electrographic seizures emanating out of the right  temporal region and spreading mostly to the entire right side. Her Keppra was changed to 1500 mg every 12 hours.Neurology feels seizures caused by severe hyperglycemia.Subcutaneous Lovenox has been added for DVT prophylaxis. Patient nothing by mouth with nasogastric tube feeds for nutritional support followed by speech therapy untilfollow-up modified barium swallow 05/13/2018 cleared her formechanical soft thin liquid diet initiated. Patient didrequireintubation 05/09/2018 05/11/2018. Bouts of hypotension requiring nor-epi. Therapy evaluations completed with recommendations of physical medicine rehabilitation consult. Patient transferred to [facility] on 05/14/2018 .   Patient currently requires max with mobility secondary to decreased cardiorespiratoy endurance, unbalanced muscle activation, ataxia, decreased coordination and decreased motor planning, decreased visual motor skills, decreased attention, decreased awareness, decreased problem solving, decreased safety awareness, decreased memory and delayed processing and decreased sitting balance, decreased standing balance, decreased postural control and decreased balance strategies.  Prior to hospitalization, patient was independent  with mobility and lived with Daughter(13 yo daughter) in a [place] home.  Home access is 10 steps to ascend to 2nd floor apartment Stairs to enter.  Patient will benefit from skilled PT intervention to maximize safe functional mobility, minimize fall risk and decrease caregiver burden for planned discharge home with 24 hour supervision.  Anticipate patient will benefit from follow up [place] at discharge.  PT - End of Session Activity Tolerance: Tolerates < 10 min activity, no significant change in vital signs Endurance Deficit: Yes Endurance Deficit Description: decreased PT Assessment Rehab Potential (ACUTE/IP ONLY): Good PT Barriers to Discharge: [place] home environment;Decreased caregiver support;Home environment  access/layout;Lack of/limited family support PT Barriers to Discharge Comments: Pt's mother works during day, pt states this is the only person that can provide any assist. Pt lives on 2nd floor apartment.  PT Patient demonstrates impairments in the following area(s): Balance;Endurance;Motor;Safety;Perception PT Transfers Functional Problem(s): Bed Mobility;Bed to Chair;Car;Furniture;Floor PT Locomotion Functional Problem(s): Ambulation;Stairs PT Plan PT Intensity: Minimum of 1-2 x/day ,45 to 90 minutes PT Frequency: 5 out of 7 days PT Duration Estimated Length of Stay: 10-12 days PT Treatment/Interventions: Ambulation/gait training;Cognitive remediation/compensation;Discharge planning;DME/adaptive equipment instruction;Functional mobility training;Pain management;Psychosocial support;Therapeutic Activities;Splinting/orthotics;UE/LE Strength taining/ROM;Visual/perceptual remediation/compensation;Wheelchair Biochemist, clinical;Therapeutic Exercise;Skin care/wound management;Patient/family education;Neuromuscular re-education;Functional electrical stimulation;Disease management/prevention;Community reintegration;Balance/vestibular training PT Transfers Anticipated Outcome(s): supervision 2/2 cognitive deficits PT Locomotion Anticipated Outcome(s): supervision household gait w/ LRAD 2/2 cognitive deficits PT Recommendation Follow Up Recommendations: Home health PT Patient destination: Home Equipment Recommended: To be determined  Skilled Therapeutic Intervention  Pt in recliner and agreeable to therapy, denies pain. Min assist stand pivot transfer to w/c. Total assist w/c transport to/from therapy gym for time management. Performed functional mobility as detailed below including bed mobility, transfers, gait, stair negotiation, and car transfer w/ min assist. Pt heavily reliant on UE support at this time, introduced RW and ambulated w/ min assist 25'  and 125'. Tactile and verbal cues for upright posture throughout and manual assist for upright balance/postural control 2/2 ataxic gait. Frequent rest breaks throughout session 2/2 fatigue/lethargy. Pt reports she just took her lyrica which causes sleepiness with her. Switched to more appropriate fitting w/c 2/2 pt's body habitus and returned to room via w/c. Instructed pt in results of PT evaluation as detailed below, PT POC, rehab potential, rehab goals, and discharge recommendations. Additionally discussed CIR's policies regarding fall safety and use of chair alarm and/or quick release  belt. Pt verbalized understanding and in agreement. Ended session in recliner, all needs in reach.  PT Evaluation Precautions/Restrictions Precautions Precautions: Fall Restrictions Weight Bearing Restrictions: No General   Vital Signs  Pain Pain Assessment Pain Score: 0-No pain Home Living/Prior Functioning Home Living Available Help at Discharge: Family;Available PRN/intermittently(Mother can assist PRN but also works, mother lives 2 minutes away) Type of Home: Apartment Home Access: Stairs to enter [employer] of Steps: 10 steps to ascend to 2nd floor apartment  [place]: One level Bathroom Shower/Tub: Chiropodist: Standard Bathroom Accessibility: Yes  Lives With: Daughter(13 yo daughter) Prior Function Level of Independence: Independent with basic ADLs;Independent with homemaking with ambulation;Independent with transfers;Independent with gait  Able to Take Stairs?: Yes Driving: Yes Vocation: Full time employment Comments: works, drives Vision/Perception  Vision - Risk analyst: Within Functional Limits Ocular Range of Motion: Within Functional Limits Alignment/Gaze Preference: Within Defined Limits Tracking/Visual Pursuits: Impaired - to be further tested in functional context;Requires cues, head turns, or add eye shifts to track Saccades:  Decreased speed of saccadic movement;Additional eye shifts occurred during testing Convergence: Within functional limits Perception Perception: Within Functional Limits Praxis Praxis: Intact  Cognition Overall Cognitive Status: Impaired/Different from baseline Arousal/Alertness: Awake/alert Orientation Level: Oriented X4 Sustained Attention: Impaired Memory: Impaired Memory Impairment: Decreased recall of new information;Decreased short term memory;Retrieval deficit Awareness: Impaired Problem Solving: Impaired Executive Function: Reasoning;Sequencing;Organizing;Decision Making Reasoning: Impaired Reasoning Impairment: Verbal complex;Functional complex Sequencing: Impaired Sequencing Impairment: Verbal complex;Functional complex Organizing: Impaired Organizing Impairment: Verbal complex;Functional complex Decision Making: Impaired Decision Making Impairment: Verbal complex;Functional complex Behaviors: Impulsive Safety/Judgment: Impaired Comments: decreased awareness overall  Sensation Sensation Light Touch: Appears Intact(BLEs) Coordination Gross Motor Movements are Fluid and Coordinated: No Fine Motor Movements are Fluid and Coordinated: Yes Coordination and Movement Description: Ataxic LEs, most prevalant without device use  Finger Nose Finger Test: Slight dysmetria Lt compared to Rt Heel Shin Test: Mildly impaired bilterally Motor  Motor Motor: Ataxia  Mobility Bed Mobility Bed Mobility: Rolling Right;Rolling Left;Supine to Sit;Sit to Supine Rolling Right: Supervision/verbal cueing Rolling Left: Supervision/Verbal cueing Supine to Sit: Supervision/Verbal cueing Sit to Supine: Supervision/Verbal cueing Transfers Transfers: Sit to Stand;Stand to Sit;Stand Pivot Transfers Sit to Stand: Minimal Assistance - Patient > 75% Stand to Sit: Minimal Assistance - Patient > 75% Stand Pivot Transfers: Minimal Assistance - Patient > 75% Stand Pivot Transfer Details: Verbal  cues for safe use of DME/AE;Verbal cues for precautions/safety Transfer (Assistive device): None Locomotion  Gait Ambulation: Yes Gait Assistance: Maximal Assistance - Patient 25-49% Gait Distance (Feet): 5 Feet Assistive device: None Gait Assistance Details: Manual facilitation for weight bearing;Manual facilitation for weight shifting;Manual facilitation for placement;Verbal cues for precautions/safety;Verbal cues for technique Gait Gait: Yes Gait Pattern: Impaired Gait Pattern: Poor foot clearance - left;Ataxic;Wide base of support Gait velocity: decreased Stairs / Additional Locomotion Stairs: Yes Stairs Assistance: Minimal Assistance - Patient > 75% Stair Management Technique: One rail Right Number of Stairs: 4 Height of Stairs: 6 Wheelchair Mobility Wheelchair Mobility: No  Trunk/Postural Assessment  Cervical Assessment Cervical Assessment: Exceptions to WFL(forward head) Thoracic Assessment Thoracic Assessment: Exceptions to WFL(rounded shoulders) Lumbar Assessment Lumbar Assessment: (posterior pelvic tilt) Postural Control Postural Control: Deficits on evaluation(delayed/absent)  Balance Balance Balance Assessed: Yes Dynamic Sitting Balance Dynamic Sitting - Level of Assistance: 5: Stand by assistance Dynamic Standing Balance Dynamic Standing - Level of Assistance: 4: Min assist Extremity Assessment  RUE Assessment RUE Assessment: Within Functional Limits Active Range of Motion (AROM) Comments: AROM WNL General Strength  Comments: 4/5 proximal to distal  LUE Assessment LUE Assessment: Within Functional Limits Active Range of Motion (AROM) Comments: AROM WNL General Strength Comments: 4/5 proximal to distal RLE Assessment RLE Assessment: Within Functional Limits LLE Assessment LLE Assessment: Within Functional Limits    Refer to Care Plan for Long Term Goals  Recommendations for other services: None   Discharge Criteria: Patient will be discharged from  PT if patient refuses treatment 3 consecutive times without medical reason, if treatment goals not met, if there is a change in medical status, if patient makes no progress towards goals or if patient is discharged from hospital.  The above assessment, treatment plan, treatment alternatives and goals were discussed and mutually agreed upon: by patient  Jaki Hammerschmidt K Luan Urbani 05/15/2018, 2:59 PM

## 2018-05-15 NOTE — Progress Notes (Signed)
Nutrition Brief Note  RD consulted for Enteral tube feeding initiation and management.   While pt had been on TF during her hospital admission, chart shows she is no longer on TF and is currently ordered a regular textured diet. Called and spoke with RN who confirms pt is eating by mouth and has already had her ngt removed.   No need for nutrition support at this time. Will d/c consult.   Burtis Junes RD, LDN, CNSC Clinical Nutrition Available Tues-Sat via Pager: [phone] 05/15/2018 9:40 AM

## 2018-05-15 NOTE — Progress Notes (Signed)
[place] PHYSICAL MEDICINE & REHABILITATION PROGRESS NOTE   Subjective/Complaints: States she slept well. Ready for therapy this morning.   ROS: Patient denies fever, rash, sore throat, blurred vision, nausea, vomiting, diarrhea, cough, shortness of breath or chest pain, joint or back pain, headache, or mood change.    Objective:   Dg Swallowing Func-speech Pathology  Result Date: 05/13/2018 Objective Swallowing Evaluation: Type of Study: MBS-Modified Barium Swallow Study  Patient Details Name: Rebecca Johnston MRN: [id number] Date of Birth: December 21, 1970 Today's Date: 05/13/2018 Time: SLP Start Time (ACUTE ONLY): 4098 -SLP Stop Time (ACUTE ONLY): 1505 SLP Time Calculation (min) (ACUTE ONLY): 20 min Past Medical History: Past Medical History: Diagnosis Date . Anemia  . Asthma  . Diabetes mellitus without complication ([place]) 01/9146 . Fibroids  . GERD (gastroesophageal reflux disease)  . History of blood transfusion Feb 2015 . Obesity, Class III, BMI 40-49.9 (morbid obesity) ([place])  . Shortness of breath dyspnea  Past Surgical History: Past Surgical History: Procedure Laterality Date . ABDOMINAL HYSTERECTOMY N/A 12/08/2014  Procedure: HYSTERECTOMY ABDOMINAL;  Surgeon: Woodroe Mode, MD;  Location: [facility];  Service: Gynecology;  Laterality: N/A; . DILATION AND CURETTAGE OF UTERUS   . DILITATION & CURRETTAGE/HYSTROSCOPY WITH VERSAPOINT RESECTION N/A 01/06/2014  Procedure: Corena Pilgrim WITH VERSAPOINT;  Surgeon: Woodroe Mode, MD;  Location: [facility];  Service: Gynecology;  Laterality: N/A; . LAPAROSCOPIC LYSIS OF ADHESIONS N/A 12/08/2014  Procedure: LAPAROSCOPIC LYSIS OF ADHESIONS;  Surgeon: Michael Boston, MD;  Location: [facility];  Service: General;  Laterality: N/A; . OVARIAN CYST REMOVAL    cyst not removed. Cyst drained . SUPRA-UMBILICAL HERNIA N/A 82/11/5619  Procedure: LAPARSCOPIC SUPRA-UMBILICAL HERNIA REPAIR ;  Surgeon: Michael Boston, MD;  Location: [facility];  Service: General;  Laterality: N/A; . VENTRAL HERNIA  REPAIR N/A 12/08/2014  Procedure: LAPAROSCOPIC INCARCERATED INCISIONAL VENTRAL WALL HERNIA REPAIR;  Surgeon: Michael Boston, MD;  Location: [facility];  Service: General;  Laterality: N/A; HPI: Pt is a 48 y.o. female with medical history significant of diabetes mellitus, asthma, cardiac, obesity, CKD stage III, who presented with headache, blurry vision, dizziness. Patient stated that she has been having headaches for more than 1 week in the right temporal area, constant, 8 out of 10 severity, nonradiating. She also reported right leg weakness, slurred speech, and facial droop. CT of the head and MRI were negative for acute pathology. Pt was found obtunded only responsive to pain on 3/8 and was intubated from 05/09/18 to 05/11/18.  EEG 3/6 right temporal complex partial seizure. EEG 3/10 showed evidence of an improving diffuse encephalopathy. Chest x-ray of 05/11/18 revealed lower lobe atelectatic change bilaterally but a degree of superimposed pneumonia in these areas could not be excluded entirely.  No data recorded Assessment / Plan / Recommendation CHL IP CLINICAL IMPRESSIONS 05/13/2018 Clinical Impression Pt presents with oropharyngeal dysphagia characterized by impaired A-P transport, impaired bolus control, a pharyngeal delay, and reduced laryngeal excursion. She demonstrated lingual pumping, premature spillage to the valleculae, and penetration (PAS 3) of thin liquids when consecutive swallows were used. Penetration was eliminated with use of individual sips and no pharyngeal residue or instances of aspiration were demontrated. It is recommended that a dysphagia 3 diet with thin liquids be initiated at this time with obervance of swallowing precautions. SLP will follow to ensure tolerance of the recommended diet and for potential upgrade to regular consistency. SLP Visit Diagnosis Dysphagia, oropharyngeal phase (R13.12) Attention and concentration deficit following -- Frontal lobe and executive function deficit  following --  Impact on safety and function Mild aspiration risk   CHL IP TREATMENT RECOMMENDATION 05/13/2018 Treatment Recommendations Therapy as outlined in treatment plan below   Prognosis 05/13/2018 Prognosis for Safe Diet Advancement Good Barriers to Reach Goals Cognitive deficits Barriers/Prognosis Comment -- CHL IP DIET RECOMMENDATION 05/13/2018 SLP Diet Recommendations Dysphagia 3 (Mech soft) solids;Thin liquid Liquid Administration via Straw;Cup Medication Administration Whole meds with liquid Compensations Slow rate;Small sips/bites;Other (Comment) Postural Changes Seated upright at 90 degrees   CHL IP OTHER RECOMMENDATIONS 05/13/2018 Recommended Consults -- Oral Care Recommendations Oral care BID Other Recommendations --   CHL IP FOLLOW UP RECOMMENDATIONS 05/13/2018 Follow up Recommendations Inpatient Rehab   CHL IP FREQUENCY AND DURATION 05/13/2018 Speech Therapy Frequency (ACUTE ONLY) min 2x/week Treatment Duration 2 weeks      CHL IP ORAL PHASE 05/13/2018 Oral Phase Impaired Oral - Pudding Teaspoon -- Oral - Pudding Cup -- Oral - Honey Teaspoon -- Oral - Honey Cup -- Oral - Nectar Teaspoon -- Oral - Nectar Cup -- Oral - Nectar Straw -- Oral - Thin Teaspoon -- Oral - Thin Cup Lingual pumping;Premature spillage Oral - Thin Straw Lingual pumping;Premature spillage Oral - Puree Lingual pumping;Premature spillage Oral - Mech Soft Lingual pumping Oral - Regular Lingual pumping Oral - Multi-Consistency -- Oral - Pill Lingual pumping;Premature spillage Oral Phase - Comment --  CHL IP PHARYNGEAL PHASE 05/13/2018 Pharyngeal Phase Impaired Pharyngeal- Pudding Teaspoon -- Pharyngeal -- Pharyngeal- Pudding Cup -- Pharyngeal -- Pharyngeal- Honey Teaspoon -- Pharyngeal -- Pharyngeal- Honey Cup -- Pharyngeal -- Pharyngeal- Nectar Teaspoon -- Pharyngeal -- Pharyngeal- Nectar Cup -- Pharyngeal -- Pharyngeal- Nectar Straw -- Pharyngeal -- Pharyngeal- Thin Teaspoon -- Pharyngeal -- Pharyngeal- Thin Cup Delayed swallow  initiation-vallecula;Reduced anterior laryngeal mobility Pharyngeal -- Pharyngeal- Thin Straw Delayed swallow initiation-vallecula;Reduced anterior laryngeal mobility;Penetration/Aspiration during swallow;Penetration/Aspiration before swallow Pharyngeal Material enters airway, remains ABOVE vocal cords and not ejected out Pharyngeal- Puree Delayed swallow initiation-vallecula;Reduced anterior laryngeal mobility Pharyngeal -- Pharyngeal- Mechanical Soft Delayed swallow initiation-vallecula;Reduced anterior laryngeal mobility Pharyngeal -- Pharyngeal- Regular Delayed swallow initiation-vallecula;Reduced anterior laryngeal mobility Pharyngeal -- Pharyngeal- Multi-consistency -- Pharyngeal -- Pharyngeal- Pill Delayed swallow initiation-vallecula;Reduced anterior laryngeal mobility Pharyngeal -- Pharyngeal Comment --  CHL IP CERVICAL ESOPHAGEAL PHASE 05/13/2018 Cervical Esophageal Phase WFL Pudding Teaspoon -- Pudding Cup -- Honey Teaspoon -- Honey Cup -- Nectar Teaspoon -- Nectar Cup -- Nectar Straw -- Thin Teaspoon -- Thin Cup -- Thin Straw -- Puree -- Mechanical Soft -- Regular -- Multi-consistency -- Pill -- Cervical Esophageal Comment -- Shanika I. Hardin Negus, MS, [place] Office number [phone] Pager [phone] Horton Marshall 05/13/2018, 3:45 PM              Recent Labs    05/14/18 1801  WBC 8.7  HGB 10.5*  HCT 36.0  PLT 292   Recent Labs    05/13/18 0402 05/14/18 1801  NA 134*  --   K 3.8  --   CL 101  --   CO2 25  --   GLUCOSE 197*  --   BUN 18  --   CREATININE 1.45* 1.52*  CALCIUM 8.8*  --     Intake/Output Summary (Last 24 hours) at 05/15/2018 1058 Last data filed at 05/15/2018 0900 Gross per 24 hour  Intake 360 ml  Output -  Net 360 ml     Physical Exam: Vital Signs Blood pressure 106/60, pulse 94, temperature 98.8 F (37.1 C), temperature source Oral, resp. rate 18, height 5\' 5"  (1.651 m), weight 116.9 kg,  last menstrual period 11/26/2014,  SpO2 95 %. Constitutional: No distress . Vital signs reviewed. obese HEENT: EOMI, oral membranes moist Neck: supple Cardiovascular: RRR without murmur. No JVD    Respiratory: CTA Bilaterally without wheezes or rales. Normal effort    GI: BS +, non-tender, non-distended  Musculoskeletal:Normal range of motion.  General: Edema(BLE)present. No tendernessor deformity.  Neurological: More alert. Oriented to place, reason, month. Follows all simple commands.  Moves UE3-4/5 prox to distal. LE: 3/5 prox to 4/5 distally. Sensed pain in all 4's. CN non-focal but didn't consistently participate in CN exam.   Skin: Skin isdry. She isnot diaphoretic.  Psychiatric: Somewhat flat    Assessment/Plan: 1. Functional deficits secondary to status epilepticus/encephalopathy which require 3+ hours per day of interdisciplinary therapy in a comprehensive inpatient rehab setting.  Physiatrist is providing close team supervision and 24 hour management of active medical problems listed below.  Physiatrist and rehab team continue to assess barriers to discharge/monitor patient progress toward functional and medical goals  Care Tool:  Bathing              Bathing assist       Upper Body Dressing/Undressing Upper body dressing        Upper body assist      Lower Body Dressing/Undressing Lower body dressing            Lower body assist       Toileting Toileting    Toileting assist       Transfers Chair/bed transfer  Transfers assist           Locomotion Ambulation   Ambulation assist              Walk 10 feet activity   Assist           Walk 50 feet activity   Assist           Walk 150 feet activity   Assist           Walk 10 feet on uneven surface  activity   Assist           Wheelchair     Assist               Wheelchair 50 feet with 2 turns activity    Assist            Wheelchair 150 feet  activity     Assist          Medical Problem List and Plan: 1.Decreased functional mobility with headache dizzinesssecondary to refractory focal status epilepticus possibly precipitated by severe hyperglycemia.Additionally debilitated from multiple medical issues and prolonged hospital stay -beginning therapies -  sleep chart 2. Antithrombotics: -DVT/anticoagulation:Subcutaneous Lovenox. Monitor for bleeding episodes -antiplatelet therapy: none 3. Pain Management:Tylenol as needed 4. Mood:provide emotional support -antipsychotic agents: none 5. Neuropsych: This patientiscapable of making decisions on herown behalf. 6. Skin/Wound Care:Routine skin checks 7. Fluids/Electrolytes/Nutrition:Routine ins and outs with follow-up chemistries  -encourage PO intake 8. Seizure disorder. Keppra 1500 mg twice a day, Lyrica 200 mg 3 times a day.Pt off phenobarbital and dilantin - Follow-up neurology servicesas outpt -monitor closely for further seizure activity -keep HOB at 30 degrees 9.Diabetes mellitus with peripheral neuropathy. Hemoglobin A1c 14.9. Lantus insulin 10 units daily.   -fair control at present. Follow for pattern 10. Dysphagia. Diet advanced to mechanical soft. 11. Asthma with remote history of tobacco abuse. Continue nebulizers 12. Morbid obesity. BMI 43.44. Dietary follow-up 13.CKD stage III. Follow-up chemistries on  Monday    LOS: 1 days A FACE TO FACE EVALUATION WAS PERFORMED  Meredith Staggers 05/15/2018, 10:58 AM

## 2018-05-16 ENCOUNTER — Inpatient Hospital Stay (HOSPITAL_COMMUNITY): Payer: Medicaid Other | Admitting: Speech Pathology

## 2018-05-16 LAB — GLUCOSE, CAPILLARY
GLUCOSE-CAPILLARY: 172 mg/dL — AB (ref 70–99)
Glucose-Capillary: 154 mg/dL — ABNORMAL HIGH (ref 70–99)
Glucose-Capillary: 220 mg/dL — ABNORMAL HIGH (ref 70–99)
Glucose-Capillary: 133 mg/dL — ABNORMAL HIGH (ref 70–99)

## 2018-05-16 MED ORDER — TOPIRAMATE 25 MG PO TABS
25.0000 mg | ORAL_TABLET | Freq: Two times a day (BID) | ORAL | Status: DC
  Administered 2018-05-16 (×2): 25 mg via ORAL
  Filled 2018-05-16 (×2): qty 1

## 2018-05-16 MED ORDER — INSULIN ASPART 100 UNIT/ML ~~LOC~~ SOLN
0.0000 [IU] | Freq: Three times a day (TID) | SUBCUTANEOUS | Status: DC
  Administered 2018-05-16: 1 [IU] via SUBCUTANEOUS
  Administered 2018-05-17: 3 [IU] via SUBCUTANEOUS
  Administered 2018-05-18 – 2018-05-25 (×3): 1 [IU] via SUBCUTANEOUS

## 2018-05-16 MED ORDER — LIVING WELL WITH DIABETES BOOK
Freq: Once | Status: AC
  Administered 2018-05-16: 21:00:00

## 2018-05-16 MED ORDER — LEVETIRACETAM 750 MG PO TABS
1500.0000 mg | ORAL_TABLET | Freq: Two times a day (BID) | ORAL | Status: DC
  Administered 2018-05-16 – 2018-05-18 (×4): 1500 mg via ORAL
  Filled 2018-05-16 (×4): qty 2

## 2018-05-16 NOTE — Progress Notes (Signed)
[place] PHYSICAL MEDICINE & REHABILITATION PROGRESS NOTE   Subjective/Complaints: Pt reports ongoing right temporal headache (first i've heard of it). State's it's been there since prior to admit, before seizures. Denies any associated aura, descrbies it throbbing  ROS: Patient denies fever, rash, sore throat, blurred vision, nausea, vomiting, diarrhea, cough, shortness of breath or chest pain, joint or back pain, headache, or mood change. .    Objective:   No results found. Recent Labs    05/14/18 1801  WBC 8.7  HGB 10.5*  HCT 36.0  PLT 292   Recent Labs    05/14/18 1801  CREATININE 1.52*    Intake/Output Summary (Last 24 hours) at 05/16/2018 0932 Last data filed at 05/15/2018 2300 Gross per 24 hour  Intake 720 ml  Output -  Net 720 ml     Physical Exam: Vital Signs Blood pressure 95/70, pulse 91, temperature 98.2 F (36.8 C), temperature source Oral, resp. rate 18, height 5\' 5"  (1.651 m), weight 118 kg, last menstrual period 11/26/2014, SpO2 98 %. Constitutional: No distress . Vital signs reviewed. obese HEENT: EOMI, oral membranes moist Neck: supple Cardiovascular: RRR without murmur. No JVD    Respiratory: CTA Bilaterally without wheezes or rales. Normal effort    GI: BS +, non-tender, non-distended  Musculoskeletal:Normal range of motion. no focal tenderness General: Edema(BLE)present. No tendernessor deformity.  Neurological: More alert. Initiating a lot more. Oriented to place, reason, month. Follows all simple commands.  Moves UE3-4/5 prox to distal. LE: 3/5 prox to 4/5 distally. Sensed pain in all 4's. CN non-focal    Skin: Skin isdry. She isnot diaphoretic.  Psychiatric: Somewhat flat    Assessment/Plan: 1. Functional deficits secondary to status epilepticus/encephalopathy which require 3+ hours per day of interdisciplinary therapy in a comprehensive inpatient rehab setting.  Physiatrist is providing close team supervision and 24 hour  management of active medical problems listed below.  Physiatrist and rehab team continue to assess barriers to discharge/monitor patient progress toward functional and medical goals  Care Tool:  Bathing    Body parts bathed by patient: Right arm, Left arm, Chest, Abdomen, Front perineal area, Buttocks, Right upper leg, Left upper leg, Right lower leg, Left lower leg, Face         Bathing assist Assist Level: Minimal Assistance - Patient > 75%(for balance)     Upper Body Dressing/Undressing Upper body dressing   What is the patient wearing?: Hospital gown only    Upper body assist Assist Level: Minimal Assistance - Patient > 75%    Lower Body Dressing/Undressing Lower body dressing      What is the patient wearing?: Underwear/pull up     Lower body assist Assist for lower body dressing: Minimal Assistance - Patient > 75%     Toileting Toileting Toileting Activity did not occur (Clothing management and hygiene only): N/A (no void or bm)  Toileting assist Assist for toileting: Minimal Assistance - Patient > 75%     Transfers Chair/bed transfer  Transfers assist     Chair/bed transfer assist level: Minimal Assistance - Patient > 75%     Locomotion Ambulation   Ambulation assist      Assist level: Maximal Assistance - Patient 25 - 49% Assistive device: Other (comment)(none) Max distance: 5'   Walk 10 feet activity   Assist  Walk 10 feet activity did not occur: Safety/medical concerns        Walk 50 feet activity   Assist Walk 50 feet with 2 turns activity  did not occur: Safety/medical concerns         Walk 150 feet activity   Assist Walk 150 feet activity did not occur: Safety/medical concerns         Walk 10 feet on uneven surface  activity   Assist Walk 10 feet on uneven surfaces activity did not occur: Safety/medical concerns         Wheelchair     Assist Will patient use wheelchair at discharge?: No   Wheelchair  activity did not occur: N/A         Wheelchair 50 feet with 2 turns activity    Assist    Wheelchair 50 feet with 2 turns activity did not occur: N/A       Wheelchair 150 feet activity     Assist Wheelchair 150 feet activity did not occur: N/A        Medical Problem List and Plan: 1.Decreased functional mobility with headache dizzinesssecondary to refractory focal status epilepticus possibly precipitated by severe hyperglycemia.Additionally debilitated from multiple medical issues and prolonged hospital stay --Continue CIR therapies including PT, OT, and SLP  -  sleep chart 2. Antithrombotics: -DVT/anticoagulation:Subcutaneous Lovenox. Monitor for bleeding episodes -antiplatelet therapy: none 3. Pain Management:c/o of right temporal headache (which was BEFORE the seizures). Could be some form of vascular headache or certainly related to seizures.    -begin trial of topamax 25mg  bid 4. Mood:provide emotional support -antipsychotic agents: none 5. Neuropsych: This patientiscapable of making decisions on herown behalf. 6. Skin/Wound Care:Routine skin checks 7. Fluids/Electrolytes/Nutrition:Routine ins and outs with follow-up chemistries  -encourage PO intake 8. Seizure disorder. Keppra 1500 mg twice a day, Lyrica 200 mg 3 times a day.Pt off phenobarbital and dilantin - Follow-up neurology servicesas outpt -no further seizure activity -keep HOB at 30 degrees 9.Diabetes mellitus with peripheral neuropathy. Hemoglobin A1c 14.9. Lantus insulin 10 units daily.   -borderline/poor control at present.    -increase lantus to 15u daily 10. Dysphagia. Diet advanced to mechanical soft. 11. Asthma with remote history of tobacco abuse. Continue nebulizers 12. Morbid obesity. BMI 43.44. Dietary follow-up 13.CKD stage III. Follow-up chemistries on Monday    LOS: 2 days A  FACE TO FACE EVALUATION WAS PERFORMED  Meredith Staggers 05/16/2018, 9:32 AM

## 2018-05-16 NOTE — Progress Notes (Signed)
Slept good during night. Complained of right temporal headache, PRN tylenol given at HS and this AM. Up to [facility]. Urine malodorous. Rebecca Johnston A

## 2018-05-17 ENCOUNTER — Inpatient Hospital Stay (HOSPITAL_COMMUNITY): Payer: Medicaid Other | Admitting: Occupational Therapy

## 2018-05-17 ENCOUNTER — Inpatient Hospital Stay (HOSPITAL_COMMUNITY): Payer: Medicaid Other | Admitting: Physical Therapy

## 2018-05-17 ENCOUNTER — Inpatient Hospital Stay (HOSPITAL_COMMUNITY): Payer: Medicaid Other

## 2018-05-17 ENCOUNTER — Inpatient Hospital Stay (HOSPITAL_COMMUNITY): Payer: Medicaid Other | Admitting: Speech Pathology

## 2018-05-17 LAB — GLUCOSE, CAPILLARY
GLUCOSE-CAPILLARY: 120 mg/dL — AB (ref 70–99)
Glucose-Capillary: 207 mg/dL — ABNORMAL HIGH (ref 70–99)
Glucose-Capillary: 108 mg/dL — ABNORMAL HIGH (ref 70–99)
Glucose-Capillary: 68 mg/dL — ABNORMAL LOW (ref 70–99)
Glucose-Capillary: 105 mg/dL — ABNORMAL HIGH (ref 70–99)
Glucose-Capillary: 130 mg/dL — ABNORMAL HIGH (ref 70–99)

## 2018-05-17 LAB — CBC WITH DIFFERENTIAL/PLATELET
Abs Immature Granulocytes: 0.22 10*3/uL — ABNORMAL HIGH (ref 0.00–0.07)
BASOS ABS: 0 10*3/uL (ref 0.0–0.1)
Basophils Relative: 1 %
Eosinophils Absolute: 0.2 10*3/uL (ref 0.0–0.5)
Eosinophils Relative: 3 %
HCT: 31.4 % — ABNORMAL LOW (ref 36.0–46.0)
Hemoglobin: 9.4 g/dL — ABNORMAL LOW (ref 12.0–15.0)
IMMATURE GRANULOCYTES: 3 %
Lymphocytes Relative: 18 %
Lymphs Abs: 1.4 10*3/uL (ref 0.7–4.0)
MCH: 22 pg — ABNORMAL LOW (ref 26.0–34.0)
MCHC: 29.9 g/dL — ABNORMAL LOW (ref 30.0–36.0)
MCV: 73.4 fL — ABNORMAL LOW (ref 80.0–100.0)
Monocytes Absolute: 0.7 10*3/uL (ref 0.1–1.0)
Monocytes Relative: 9 %
NRBC: 0 % (ref 0.0–0.2)
Neutro Abs: 5.3 10*3/uL (ref 1.7–7.7)
Neutrophils Relative %: 66 %
Platelets: 267 10*3/uL (ref 150–400)
RBC: 4.28 MIL/uL (ref 3.87–5.11)
RDW: 17.6 % — AB (ref 11.5–15.5)
WBC: 7.9 10*3/uL (ref 4.0–10.5)

## 2018-05-17 LAB — COMPREHENSIVE METABOLIC PANEL
ALT: 26 U/L (ref 0–44)
AST: 23 U/L (ref 15–41)
Albumin: 2.8 g/dL — ABNORMAL LOW (ref 3.5–5.0)
Alkaline Phosphatase: 179 U/L — ABNORMAL HIGH (ref 38–126)
Anion gap: 8 (ref 5–15)
BUN: 20 mg/dL (ref 6–20)
CO2: 23 mmol/L (ref 22–32)
Calcium: 9.1 mg/dL (ref 8.9–10.3)
Chloride: 102 mmol/L (ref 98–111)
Creatinine, Ser: 1.61 mg/dL — ABNORMAL HIGH (ref 0.44–1.00)
GFR calc Af Amer: 43 mL/min — ABNORMAL LOW (ref >60)
GFR calc non Af Amer: 37 mL/min — ABNORMAL LOW (ref >60)
Glucose, Bld: 187 mg/dL — ABNORMAL HIGH (ref 70–99)
POTASSIUM: 3.9 mmol/L (ref 3.5–5.1)
Sodium: 133 mmol/L — ABNORMAL LOW (ref 135–145)
Total Bilirubin: 0.5 mg/dL (ref 0.3–1.2)
Total Protein: 6.9 g/dL (ref 6.5–8.1)

## 2018-05-17 MED ORDER — GLIMEPIRIDE 2 MG PO TABS
2.0000 mg | ORAL_TABLET | Freq: Every day | ORAL | Status: DC
  Administered 2018-05-17 – 2018-05-21 (×5): 2 mg via ORAL
  Filled 2018-05-17 (×5): qty 1

## 2018-05-17 MED ORDER — PREGABALIN 50 MG PO CAPS
100.0000 mg | ORAL_CAPSULE | Freq: Three times a day (TID) | ORAL | Status: DC
  Administered 2018-05-17 – 2018-05-26 (×27): 100 mg via ORAL
  Filled 2018-05-17 (×27): qty 2

## 2018-05-17 MED ORDER — NORTRIPTYLINE HCL 25 MG PO CAPS
25.0000 mg | ORAL_CAPSULE | Freq: Every day | ORAL | Status: DC
  Administered 2018-05-17: 25 mg via ORAL
  Filled 2018-05-17: qty 1

## 2018-05-17 NOTE — Progress Notes (Signed)
Physical Therapy Session Note  Patient Details  Name: Rebecca Johnston MRN: [id number] Date of Birth: 08/20/1970  Today's Date: 05/17/2018 PT Individual Time: 7017-7939 PT Individual Time Calculation (min): 40 min   Short Term Goals: Week 1:  PT Short Term Goal 1 (Week 1): Pt will ambulate 36' w/ LRAD and CGA PT Short Term Goal 2 (Week 1): Pt will maintain dynamic standing balance w/ min assist PT Short Term Goal 3 (Week 1): Pt will demonstrate appropriate safety awareness w/o cues 50% of the time PT Short Term Goal 4 (Week 1): Pt will participate in 60 min of upright activity w/o increase in fatigue  Skilled Therapeutic Interventions/Progress Updates:    Pt received seated in recliner in room, agreeable to PT session. No complaints of pain. Stand pivot transfer recliner to w/c with RW and min A. Ambulation x 100', x 115' with RW and min A for balance, ataxic and path deviation. Lowered RW height for improved fit and decrease shoulder elevation with gait. Standing alt L/R 1" and 4" step taps with RW and min A for balance for BLE coordination training. Sidesteps L/R with BUE support and min A for balance for B hip strengthening. Pt requests to use the bathroom, min A for toilet transfer with min A for clothing management and use of RW. Pt has increase in flexed posture and stance with onset of fatigue even with use of RW for support. Sit to supine Supervision. Pt left supine in bed with needs in reach, bed alarm in place.  Therapy Documentation Precautions:  Precautions Precautions: Fall Restrictions Weight Bearing Restrictions: No    Therapy/Group: Individual Therapy   Excell Seltzer, PT, DPT  05/17/2018, 4:51 PM

## 2018-05-17 NOTE — Significant Event (Signed)
Hypoglycemic Event  CBG: 68     Treatment: 4 oz juice/soda  Symptoms: None  Follow-up CBG: Time:1751 CBG Result:105  Possible Reasons for Event: Medication given late  Comments/MD notified:Protocol followed    Rebecca Johnston

## 2018-05-17 NOTE — Progress Notes (Signed)
Spoke at length, last night,  with patient's Mom, on the phone R/T patient's meds, indications and side effects. Handouts of lyrica and keppra, left at bedside.  Mom feels patient's speech has been slurred and she's been intermittently drowsy, not new since admit to hospital, per Mom. "Ya'll need to fix her slurred speech, she didn't come into the hospital like that." Multi questions R/T new med topamax and side effects. Paged Dr. Naaman Plummer, about above info, orders to DC topamax. Continent of B & B, getting up to [facility]. Continues to complain of right temporal HA. No seizure activity observed. Rebecca Johnston A

## 2018-05-17 NOTE — Progress Notes (Signed)
Occupational Therapy Session Note  Patient Details  Name: Rebecca Johnston MRN: [id number] Date of Birth: 09/11/1970  Today's Date: 05/17/2018 OT Individual Time: 0830-0915 OT Individual Time Calculation (min): 45 min    Short Term Goals: Week 1:  OT Short Term Goal 1 (Week 1): Pt will complete 3 grooming tasks while standing at sink to improve standing endurance  OT Short Term Goal 2 (Week 1): Pt will complete 3/3 toileting tasks with steady assist and min vcs for DME mgt  Skilled Therapeutic Interventions/Progress Updates:    Pt received in w/c ready for therapy. She stated that she did get washed up earlier this am and did not need to shower today.  Pt transferred to recliner with min A with RW.  She donned all her clothing with set up except needing CGA in standing when she pulled her pants up as she leaned forward from locking her knees out and her balance shifted anterior.   Worked with pt on sit to stand using arm rests and not placing hands on walker until she was fully standing. Then standing with knees slightly bent to prevent the forward ant lean.   Pt transferred to arm chair to work on chair push ups focusing on controlled eccentric decent to chair 20 x 2 sets and then 10 x.  Had pt transfer back to recliner with CGA with RW to rest as she did have a headache.  Safety alarm belt donned on pt and pt with all needs met.    Despite her headache, pt participated well.  Her speech was slow but intelligible today.   Therapy Documentation Precautions:  Precautions Precautions: Fall Restrictions Weight Bearing Restrictions: No   Pain: Pain Assessment Pain Scale: 0-10 Pain Score: 8  Pain Type: Acute pain Pain Location: Head Pain Orientation: Right Pain Descriptors / Indicators: Headache Pain Frequency: Intermittent Pain Onset: On-going Pain Intervention(s): Medication (See eMAR)   Therapy/Group: Individual Therapy  SAGUIER,JULIA 05/17/2018, 11:59 AM  

## 2018-05-17 NOTE — IPOC Note (Signed)
Overall Plan of Care [facility]) Patient Details Name: Rebecca Johnston MRN: [id number] DOB: 06/12/1970  Admitting Diagnosis: <principal problem not specified>  Hospital Problems: Active Problems:   Seizure disorder [facility])     Functional Problem List: Nursing Safety, Pain, Endurance, Nutrition, Skin Integrity, Medication Management, Bowel  PT Balance, Endurance, Motor, Safety, Perception  OT Balance, Vision, Behavior, Cognition, Safety, Endurance, Motor  SLP Cognition, Endurance, Perception, Safety  TR         Basic ADL's: OT Grooming, Bathing, Dressing, Toileting     Advanced  ADL's: OT Simple Meal Preparation, Light Housekeeping     Transfers: PT Bed Mobility, Bed to Chair, Car, [employer], Futures trader, Metallurgist: PT Ambulation, Stairs     Additional Impairments: OT None  SLP Swallowing, Social Cognition, Communication expression Problem Solving, Memory, Attention, Awareness  TR      Anticipated Outcomes Item Anticipated Outcome  Self Feeding No goal  Swallowing  Mod I with least restrictive diet   Basic self-care  Supervision/cuing   Toileting  Supervision/cuing    Bathroom Transfers Supervision/cuing   Bowel/Bladder  Pt will manage bowel and bladder with min assist   Transfers  supervision 2/2 cognitive deficits  Locomotion  supervision household gait w/ LRAD 2/2 cognitive deficits  Communication  Min A   Cognition  Min A   Pain  Pt will manage pain at 4 or less on a scale of 0-10.   Safety/Judgment  Pt will remain free of falls with injury with min assist while in rehab.    Therapy Plan: PT Intensity: Minimum of 1-2 x/day ,45 to 90 minutes PT Frequency: 5 out of 7 days PT Duration Estimated Length of Stay: 10-12 days OT Intensity: Minimum of 1-2 x/day, 45 to 90 minutes OT Frequency: 5 out of 7 days OT Duration/Estimated Length of Stay: 10-12 days SLP Intensity: Minumum of 1-2 x/day, 30 to 90 minutes SLP Frequency: 3 to 5 out  of 7 days SLP Duration/Estimated Length of Stay: 10 to 12 days    Team Interventions: Nursing Interventions Patient/Family Education, Bowel Management, Pain Management, Skin Care/Wound Management, Discharge Planning, Medication Management, Dysphagia/Aspiration Precaution Training  PT interventions Ambulation/gait training, Cognitive remediation/compensation, Discharge planning, DME/adaptive equipment instruction, Functional mobility training, Pain management, Psychosocial support, Therapeutic Activities, Splinting/orthotics, UE/LE Strength taining/ROM, Visual/perceptual remediation/compensation, Wheelchair propulsion/positioning, UE/LE Coordination activities, Stair training, Therapeutic Exercise, Skin care/wound management, Patient/family education, Neuromuscular re-education, Functional electrical stimulation, Disease management/prevention, Academic librarian, Training and development officer  OT Interventions Training and development officer, Community reintegration, Disease mangement/prevention, Brewing technologist, Barrister's clerk education, Self Care/advanced ADL retraining, Therapeutic Exercise, UE/LE Coordination activities, Wheelchair propulsion/positioning, Visual/perceptual remediation/compensation, UE/LE Strength taining/ROM, Therapeutic Activities, Psychosocial support, Pain management, Functional mobility training, DME/adaptive equipment instruction, Discharge planning, Cognitive remediation/compensation  SLP Interventions Cognitive remediation/compensation, Functional tasks, Patient/family education, Therapeutic Activities, Medication managment, Internal/external aids, Speech/Language facilitation  TR Interventions    SW/CM Interventions Discharge Planning, Psychosocial Support, Patient/Family Education   Barriers to Discharge MD  Medical stability  Nursing      PT Inaccessible home environment, Decreased caregiver support, Home environment access/layout, Lack of/limited family  support Pt's mother works during day, pt states this is the only person that can provide any assist. Pt lives on 2nd floor apartment.   OT Decreased caregiver support, Medical stability, Home environment access/layout(10 steps to enter 2nd level apartment)    SLP Decreased caregiver support, Lack of/limited family support her mother works during the day and pt is single mother to 42 year old daughter  in the home  SW       Team Discharge Planning: Destination: PT-Home ,OT- Home , SLP-Home Projected Follow-up: PT-Home health PT, OT-  Home health OT, SLP-24 hour supervision/assistance, Outpatient SLP Projected Equipment Needs: PT-To be determined, OT- To be determined, SLP-None recommended by SLP Equipment Details: PT- , OT-  Patient/family involved in discharge planning: PT- Patient,  OT-Patient, SLP-Patient  MD ELOS: 10-12 days Medical Rehab Prognosis:  Excellent Assessment: The patient has been admitted for CIR therapies with the diagnosis of seizures, encephalopathy, debility. The team will be addressing functional mobility, strength, stamina, balance, safety, adaptive techniques and equipment, self-care, bowel and bladder mgt, patient and caregiver education, NMR, cognitive perceptual rx, pain control, ego support. Goals have been set at supervision for mobility and self=care and supervision to min assist for cognition. I think she may have the potential to exceed goals with decreases in seizure medications being made.    Meredith Staggers, MD, FAAPMR      See Team Conference Notes for weekly updates to the plan of care

## 2018-05-17 NOTE — Progress Notes (Signed)
Occupational Therapy Session Note  Patient Details  Name: Rebecca Johnston MRN: [id number] Date of Birth: 28-Jul-1970  Today's Date: 05/17/2018 OT Individual Time: 1300-1400 OT Individual Time Calculation (min): 60 min    Short Term Goals: Week 1:  OT Short Term Goal 1 (Week 1): Pt will complete 3 grooming tasks while standing at sink to improve standing endurance  OT Short Term Goal 2 (Week 1): Pt will complete 3/3 toileting tasks with steady assist and min vcs for DME mgt  Skilled Therapeutic Interventions/Progress Updates:    Pt seen for OT session focusing on functional mobility and transfers. Pt sitting up in recliner eating lunch upon arrival, agreeable to tx session and denying pain.  She ambulated ~38ft to w/c with min A using RW.  Throughout session, pt required VCs for hand placement during sit<>stand, no recall from each trial requiring cuing throughout session. Also required multi-modal cuing for proper RW management in functional contexts, stepping to the side and outside RW during turning tasks.  Several instances of pt not navigating around small environmental obstacles. Pt reports vision at baseline level now that she has her glasses, however, suspect some visual deficits maybe present. Cont to assess in functional context.  Pt taken to ADL apartment total A in w/c for time and energy conservation. Education and demonstration provided for tub/shower transfer utilizing tub bench. Pt return demonstrated with guarding assist, believes this will work at home.  Completed sit>stand from low soft surface couch with close supervision and VCs. Dynamic standing task removing and replacing items from overhead cabinet, completed with min A for balance.  In therapy gym, completed x3 sets of 5 sit>stand from elevated mat, lowered on subsequent trials. Completed without UE support for LE strengthening. Pt ambulated ~51ft back to room at end of session with min A before requiring seated rest break,  taken remainder of way in w/c. Pt left seated in recliner at end of session, chair belt alarm on and all needs in reach.   Therapy Documentation Precautions:  Precautions Precautions: Fall Restrictions Weight Bearing Restrictions: No  Therapy/Group: Individual Therapy  Ambers Iyengar L 05/17/2018, 7:08 AM

## 2018-05-17 NOTE — Progress Notes (Signed)
Physical Therapy Session Note  Patient Details  Name: Rebecca Johnston MRN: [id number] Date of Birth: 11-Oct-1970  Today's Date: 05/17/2018 PT Individual Time: 1115-1200 PT Individual Time Calculation (min): 45 min   Short Term Goals: Week 1:  PT Short Term Goal 1 (Week 1): Pt will ambulate 72' w/ LRAD and CGA PT Short Term Goal 2 (Week 1): Pt will maintain dynamic standing balance w/ min assist PT Short Term Goal 3 (Week 1): Pt will demonstrate appropriate safety awareness w/o cues 50% of the time PT Short Term Goal 4 (Week 1): Pt will participate in 60 min of upright activity w/o increase in fatigue  Skilled Therapeutic Interventions/Progress Updates:   Pt sitting up in recliner; very lethargic.  PT donned TEDS and non slip socks.   Sit> stand with min assist to RW.  Gait training in room with RW to toilet, mod asisst for occasional posterior LOB when navigating incline into BR.  Toilet transfer with RW, wall bar, min assist.  Pt completed 3/3 toileting tasks with CGA.  Hand washing from w/c level with supervision.  Seated Therapeutic exercise performed with LE to increase strength for functional mobility: 2 x 10 bil adductor squeezes, bil glut sets.  Gait training with RW on level tile, no turns, x 52' with min assist until stopping at end of hall, causing LOB with mod assist needed to regain balance.  Seated therapeutic activity to promote trunk lengthening/shortening/rotating to facilitate balance reactions, reaching with L/R hands out of BOS . Pt had minimal LE support on floor.  Pt demonstrated trunk instability 9/9 trials , R and L reaches.  Stand pivot bed> recliner with Rw, min assist, max cues for safety when approaching chair and reaching to sit.  Pt left resting in recliner with LEs elevated, seat belt alarm set and needs at hand.     Therapy Documentation Precautions:  Precautions Precautions: Fall Restrictions Weight Bearing Restrictions: No   Pain: Pt  denies     Therapy/Group: Individual Therapy  Guerin Lashomb 05/17/2018, 12:13 PM

## 2018-05-17 NOTE — Progress Notes (Addendum)
NEUROLOGY PROGRESS NOTE  Brief history: 48 year old female initially presented to [facility] long with headache, photophobia, blurred vision and episodes of stereotypical head turning which was initially thought to be a dystonic reaction to medications.  EEG was obtained to evaluate for possible partial seizures due to the head turning.  Patient was loaded with Keppra on 3/7.  Also received loading dose of Vimpat on 3/7.  MRI was obtained which showed no acute abnormalities.  Patient was transferred to [facility] for LTM which did reveal multiple right temporal lobe seizures.  At that time she was loaded with fosphenytoin and transferred to the ICU for closer monitoring.  Keppra was continued and eventually increased to 1500 mg twice daily.  During hospital stay patient was also started on phenobarbital 65 mg nightly along with Dilantin 100 mg every 8 hours and Lyrica 200 mg 3 times daily.  Currently she is on Lyrica 200 mg 3 times daily, Keppra 1500 mg twice daily  Neurology was consulted secondary to the fact that patient continues to have normal mentation but slow expression however her answers are appropriate.  Their question to neurology is is possibly seizure related versus medication related  Subjective: At this time patient's main complaint is that she has slow verbal output.  She does not feel as though she is confused and is able to follow all the commands that are asked of her.  Exam: Vitals:   05/16/18 1844 05/17/18 0401  BP: 109/68 123/68  Pulse: 90 89  Resp: 18 18  Temp: 98.6 F (37 C) 98.1 F (36.7 C)  SpO2: 97% 98%    Physical Exam   HEENT-  Normocephalic, no lesions, without obvious abnormality.  Normal external eye and conjunctiva.   Extremities- Warm, dry and intact Musculoskeletal-no joint tenderness, deformity or swelling Skin-warm and dry, no hyperpigmentation, vitiligo, or suspicious lesions    Neuro:  Mental Status: Alert, oriented, thought content  appropriate.  Speech fluent however slow without evidence of aphasia.  Able to follow 3 step commands without difficulty. Cranial Nerves: II:  Visual fields grossly normal,  III,IV, VI: ptosis not present, extra-ocular motions intact bilaterally pupils equal, round, reactive to light and accommodation V,VII: smile symmetric, facial light touch sensation normal bilaterally VIII: hearing normal bilaterally IX,X: uvula rises midline XI: bilateral shoulder shrug XII: midline tongue extension Motor: Right : Upper extremity   5/5    Left:     Upper extremity   5/5  Lower extremity   5/5     Lower extremity   5/5 Tone and bulk:normal tone throughout; no atrophy noted Sensory: Pinprick and light touch intact throughout, bilaterally Deep Tendon Reflexes: 1+ knee jerks and 2+ upper extremity with no ankle jerks Plantars: Right: downgoing   Left: downgoing Cerebellar: normal finger-to-nose,  and normal heel-to-shin test     Medications:  Scheduled: . enoxaparin (LOVENOX) injection  60 mg Subcutaneous Q24H  . fluticasone  2 spray Each Nare Daily  . glimepiride  2 mg Oral Q breakfast  . insulin aspart  0-9 Units Subcutaneous TID WC  . insulin glargine  10 Units Subcutaneous Daily  . levETIRAcetam  1,500 mg Oral BID  . 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  At that time seizure was thought to be precipitated by severe hyperglycemia.  As  noted in previous notes MRI revealed subtle T2 hypodensity in the right occipital lobe which would be consistent with seizures precipitated by severe hyperglycemia.  At current time patient has not had any further clinical seizures.  As noted patient is now on Keppra 1500 mg twice daily and Lyrica 200 mg oral 3 times daily. Impression:  -Seizure resolved -  Bradylalia  Recommendations: - At this point possibly cut down on Lyrica to 100 mg 3 times daily as this is the max dose recommended for her CrCl. Observe to see if this improves her bradylalia   Etta Quill PA-C [facility] [phone]  05/17/2018, 10:35 AM  I have seen the patient reviewed the above note.  She also complains of a right temporal headache which waxes and wanes and has been present since before admission.  She does state that she is photophobic when it is hurting, and it is throbbing in character.  She has had an extensive work-up including a CTA head and neck, MRI brain.  At her age, temporal arteritis is exceedingly unlikely.  I would favor starting low-dose tricyclic which may be helpful with it.  I suspect that her bradylalia is likely secondary to the high dose of Lyrica and therefore we will dose it more according to her renal function.  Roland Rack, MD [facility] [phone]  If 7pm- 7am, please page neurology on call as listed in [place].

## 2018-05-17 NOTE — Progress Notes (Addendum)
Inpatient Diabetes Program Recommendations  AACE/ADA: New Consensus Statement on Inpatient Glycemic Control   Target Ranges:  Prepandial:   less than 140 mg/dL      Peak postprandial:   less than 180 mg/dL (1-2 hours)      Critically ill patients:  140 - 180 mg/dL   Results for Rebecca Johnston, Rebecca Johnston (MRN [id number]) as of 05/17/2018 08:30  Ref. Range 05/16/2018 06:40 05/16/2018 11:29 05/16/2018 17:19 05/16/2018 21:12 05/17/2018 06:21  Glucose-Capillary Latest Ref Range: 70 - 99 mg/dL 154 (H) 220 (H) 133 (H) 172 (H) 207 (H)  Results for Rebecca Johnston, Rebecca Johnston (MRN [id number]) as of 05/17/2018 08:30  Ref. Range 04/16/2017 15:01 05/06/2018 13:00  Hemoglobin A1C Latest Ref Range: 4.8 - 5.6 % 6.2 (H) 14.9 (H)   Review of Glycemic Control  Diabetes history: DM2 Outpatient Diabetes medications: Amaryl 2 mg daily Current orders for Inpatient glycemic control: Lantus 10 units daily, Novolog 0-9 units TID with meals  Inpatient Diabetes Program Recommendations:  Correction (SSI): Please consider ordering Novolog 0-5 units QHS for bedtime correction. HgbA1C: A1C 14.9% on 05/06/18. Patient was seen by Inpatient Diabetes Coordinator several times during hospital admission (last seen by Deboraha Sprang, RN) on 05/14/18).  Noted Inpatient Diabetes Coordinator consult for DM meals and planning. Will place consult for RD regarding meal planning and further education on Carb Modified diet.  NOTE: Spoke with patient regarding DM control and potential for insulin. Patient states that she recalls Diabetes Coordinator speaking with her several times during hospital admission and notes that the Diabetes Coordinator taught her the insulin pen on Friday. Patient has Living Well with DM book on bedside table and she notes that she has been reading the book. Reviewed glucose and A1C goals and noted with A1C of 14.9% glucose has been averaging 381 mg/dl over the past 2-3 months. Patient notes that her glucose had been running in the 100's mostly up  until a few weeks ago. Patient notes that her glucose was running in the 400's mg/dl at home over the past week. Patient states that she thought her glucometer was broke or something since her glucose was so high. Patient notes that she was dx with pneumonia in the emergency department prior to hospital admission. Reviewed importance of glycemic control to decrease risk of further complications from uncontrolled DM.  Patient is open to take insulin at home if needed. Asked patient to demonstrate how to use the insulin pen and patient was able to successfully demonstrate how to use the insulin pen with some verbal cues. Informed patient that nursing staff would be working with her on insulin administration and they will be asked to let patient self-administer insulin.  Patient verbalized understanding of information discussed and she states that she has no questions or concerns at this time regarding DM or insulin. Talked with Santiago Glad, RN and asked that patient be allowed to self administer insulin in case she is discharged on insulin.    Thanks, Barnie Alderman, RN, MSN, CDE Diabetes Coordinator Inpatient Diabetes Program [phone] (Team Pager from 8am to 5pm)

## 2018-05-17 NOTE — Progress Notes (Signed)
Inpatient Rehabilitation  Patient information reviewed and entered into eRehab system by Abrian Hanover M. Krina Mraz, M.A., CCC/SLP, PPS Coordinator.  Information including medical coding, functional ability and quality indicators will be reviewed and updated through discharge.    

## 2018-05-17 NOTE — Progress Notes (Addendum)
[facility] PHYSICAL MEDICINE & REHABILITATION PROGRESS NOTE   Subjective/Complaints: Pt reports ongoing right temporal headache (first i've heard of it). State's it's been there since prior to admit, before seizures. Denies any associated aura, descrbies it throbbing  ROS: Patient denies fever, rash, sore throat, blurred vision, nausea, vomiting, diarrhea, cough, shortness of breath or chest pain, joint or back pain, headache, or mood change. .    Objective:   No results found. Recent Labs    05/14/18 1801 05/17/18 0640  WBC 8.7 7.9  HGB 10.5* 9.4*  HCT 36.0 31.4*  PLT 292 267   Recent Labs    05/14/18 1801 05/17/18 0640  NA  --  133*  K  --  3.9  CL  --  102  CO2  --  23  GLUCOSE  --  187*  BUN  --  20  CREATININE 1.52* 1.61*  CALCIUM  --  9.1    Intake/Output Summary (Last 24 hours) at 05/17/2018 1761 Last data filed at 05/17/2018 0700 Gross per 24 hour  Intake 1080 ml  Output -  Net 1080 ml     Physical Exam: Vital Signs Blood pressure 123/68, pulse 89, temperature 98.1 F (36.7 C), temperature source Oral, resp. rate 18, height 5\' 5"  (1.651 m), weight 118.2 kg, last menstrual period 11/26/2014, SpO2 98 %. Constitutional: No distress . Vital signs reviewed. obese HEENT: EOMI, oral membranes moist Neck: supple Cardiovascular: RRR without murmur. No JVD    Respiratory: CTA Bilaterally without wheezes or rales. Normal effort    GI: BS +, non-tender, non-distended  Musculoskeletal:Normal range of motion. no focal tenderness General: Edema(BLE)present. No tendernessor deformity.  Neurological: More alert. Initiating a lot more. Oriented to place, reason, month. Follows all simple commands.  Moves UE3-4/5 prox to distal. LE: 3/5 prox to 4/5 distally. Sensed pain in all 4's. CN non-focal    Skin: Skin isdry. She isnot diaphoretic.  Psychiatric: Somewhat flat    Assessment/Plan: 1. Functional deficits secondary to status  epilepticus/encephalopathy which require 3+ hours per day of interdisciplinary therapy in a comprehensive inpatient rehab setting.  Physiatrist is providing close team supervision and 24 hour management of active medical problems listed below.  Physiatrist and rehab team continue to assess barriers to discharge/monitor patient progress toward functional and medical goals  Care Tool:  Bathing    Body parts bathed by patient: Right arm, Left arm, Chest, Abdomen, Front perineal area, Buttocks, Right upper leg, Left upper leg, Right lower leg, Left lower leg, Face         Bathing assist Assist Level: Moderate Assistance - Patient 50 - 74%     Upper Body Dressing/Undressing Upper body dressing   What is the patient wearing?: Hospital gown only    Upper body assist Assist Level: Minimal Assistance - Patient > 75%    Lower Body Dressing/Undressing Lower body dressing      What is the patient wearing?: Underwear/pull up     Lower body assist Assist for lower body dressing: Minimal Assistance - Patient > 75%     Toileting Toileting Toileting Activity did not occur (Clothing management and hygiene only): N/A (no void or bm)  Toileting assist Assist for toileting: Minimal Assistance - Patient > 75%     Transfers Chair/bed transfer  Transfers assist     Chair/bed transfer assist level: Minimal Assistance - Patient > 75%     Locomotion Ambulation   Ambulation assist      Assist level: Maximal Assistance -  Patient 25 - 49% Assistive device: Other (comment)(none) Max distance: 5'   Walk 10 feet activity   Assist  Walk 10 feet activity did not occur: Safety/medical concerns        Walk 50 feet activity   Assist Walk 50 feet with 2 turns activity did not occur: Safety/medical concerns         Walk 150 feet activity   Assist Walk 150 feet activity did not occur: Safety/medical concerns         Walk 10 feet on uneven surface  activity   Assist  Walk 10 feet on uneven surfaces activity did not occur: Safety/medical concerns         Wheelchair     Assist Will patient use wheelchair at discharge?: No   Wheelchair activity did not occur: N/A         Wheelchair 50 feet with 2 turns activity    Assist    Wheelchair 50 feet with 2 turns activity did not occur: N/A       Wheelchair 150 feet activity     Assist Wheelchair 150 feet activity did not occur: N/A        Medical Problem List and Plan: 1.Decreased functional mobility with headache dizzinesssecondary to refractory focal status epilepticus possibly precipitated by severe hyperglycemia.Additionally debilitated from multiple medical issues and prolonged hospital stay --Continue CIR therapies including PT, OT, and SLP   -spoke at length with pt/mother re: presentation today. Mother concerned about slurred speech and the fact that the medication she was put (abx) led to seizures.  2. Antithrombotics: -DVT/anticoagulation:Subcutaneous Lovenox. Monitor for bleeding episodes -antiplatelet therapy: none 3. Pain Management:c/o of right temporal headache (which was BEFORE the seizures). Could be some form of vascular headache or certainly related to seizures.    -held topamax given complaints yesterday, but pt states medication helped. Will resume at 25mg  at night time only. 4. Mood:provide emotional support -antipsychotic agents: none 5. Neuropsych: This patientiscapable of making decisions on herown behalf. 6. Skin/Wound Care:Routine skin checks 7. Fluids/Electrolytes/Nutrition:   -encourage PO intake  -I personally reviewed the patient's labs today.    -dietary consult  -hyponatremia 133---follow up labs Wednesday  8. Seizures/status epilepticus potentially due to hyperglycemia:  - Keppra 1500 mg twice a day, Lyrica 200 mg 3 times a day.Pt off phenobarbital and  dilantin -mother concerned about side effects of medication.   -will ask neurology to follow up to see if might be able to reduce dosing slightly -no further seizure activity   9.Diabetes mellitus with peripheral neuropathy. Hemoglobin A1c 14.9. Lantus insulin 10 units daily.   -borderline/poor control at present.    -on amaryl at home. Will resume 2mg  daily 10. Dysphagia. Diet advanced to mechanical soft. 11. Asthma with remote history of tobacco abuse. Continue nebulizers 12. Morbid obesity. BMI 43.44. Dietary follow-up 13.CKD stage III.  BUN/Cr drifting up  -encourage fluids  -recheck labs Wednesday    LOS: 3 days A FACE TO FACE EVALUATION WAS PERFORMED  Meredith Staggers 05/17/2018, 8:52 AM

## 2018-05-17 NOTE — Progress Notes (Signed)
Speech Language Pathology Daily Session Note  Patient Details  Name: Rebecca Johnston MRN: [id number] Date of Birth: [birth year]  Today's Date: 05/17/2018 SLP Individual Time: 0900-1000 SLP Individual Time Calculation (min): 60 min  Short Term Goals: Week 1: SLP Short Term Goal 1 (Week 1): Pt will utilize external memory aids to recall information in 8 out of 10 opportunities with Min A cues.  SLP Short Term Goal 2 (Week 1): Pt will complete semi-complex problem solving tasks with Mod A cues.  SLP Short Term Goal 3 (Week 1): Pt will demonstrate selective attention to task for ~ 15 minutes in mildly distracting environment with Mod A cues.  SLP Short Term Goal 4 (Week 1): Pt will demonstrate intellectual awareness by stating 3 physical and cognitive deficits that are related to acute condition in 8 out of 10 opportunities with Min A.  SLP Short Term Goal 5 (Week 1): Pt will utilize speech intelligibility strategies to increase speech intelligiiblity at the sentence level to ~ 90% with Min A cues.  SLP Short Term Goal 6 (Week 1): Pt will consume least restrictive diet with Mod I cues and minimal overt s/s of aspiration/dysphagia.   Skilled Therapeutic Interventions:    Skilled treatment session focused on cognition goals. SLP facilitated session by providing Min A to complete semi-complex medication management task and more than a reasonable amount of time to complete semi-complex calendar task. Pt demonstrate appropriate selective attention to task in mildly distracting environment for ~ 35 minutes. Pt was returned to room, left upright in recliner with lap belt alarm on and all needs within reach. Continue per current plan of care.   Pain Pain Assessment Pain Scale: 0-10 Pain Score: 0-No pain  Therapy/Group: Individual Therapy  Makiyla Linch 05/17/2018, 9:53 AM

## 2018-05-17 NOTE — Progress Notes (Signed)
Social Work Social Work Assessment and Plan  Patient Details  Name: Rebecca Johnston MRN: [id number] Date of Birth: 1970/11/10  Today's Date: 05/17/2018  Problem List:  Patient Active Problem List   Diagnosis Date Noted  . Seizure disorder ([place]) 05/14/2018  . Status epilepticus ([place])   . Headache 05/07/2018  . Lobar pneumonia ([place]) 05/07/2018  . GERD (gastroesophageal reflux disease) 05/07/2018  . Hyponatremia 05/07/2018  . HA (headache) 05/07/2018  . Hyperglycemia   . CKD (chronic kidney disease), stage III ([place]) 04/16/2017  . Neuropathy 04/04/2016  . Hx of iron deficiency anemia 12/21/2015  . Seasonal allergic rhinitis 12/21/2015  . Type II diabetes mellitus with renal manifestations ([place]) 12/21/2015  . Incarcerated hernia s/p lap repair w mesh 12/08/2014 12/08/2014  . Esophageal reflux 10/21/2013  . Abnormal CT scan, chest 09/02/2013   Past Medical History:  Past Medical History:  Diagnosis Date  . Anemia   . Asthma   . Diabetes mellitus without complication ([place]) 78/2956  . Fibroids   . GERD (gastroesophageal reflux disease)   . History of blood transfusion Feb 2015  . Obesity, Class III, BMI 40-49.9 (morbid obesity) ([place])   . Shortness of breath dyspnea    Past Surgical History:  Past Surgical History:  Procedure Laterality Date  . ABDOMINAL HYSTERECTOMY N/A 12/08/2014   Procedure: HYSTERECTOMY ABDOMINAL;  Surgeon: Woodroe Mode, MD;  Location: [facility];  Service: Gynecology;  Laterality: N/A;  . DILATION AND CURETTAGE OF UTERUS    . DILITATION & CURRETTAGE/HYSTROSCOPY WITH VERSAPOINT RESECTION N/A 01/06/2014   Procedure: Corena Pilgrim WITH VERSAPOINT;  Surgeon: Woodroe Mode, MD;  Location: [facility];  Service: Gynecology;  Laterality: N/A;  . LAPAROSCOPIC LYSIS OF ADHESIONS N/A 12/08/2014   Procedure: LAPAROSCOPIC LYSIS OF ADHESIONS;  Surgeon: Michael Boston, MD;  Location: [facility];  Service: General;  Laterality: N/A;  . OVARIAN CYST REMOVAL     cyst not removed. Cyst  drained  . SUPRA-UMBILICAL HERNIA N/A 21/05/863   Procedure: LAPARSCOPIC SUPRA-UMBILICAL HERNIA REPAIR ;  Surgeon: Michael Boston, MD;  Location: [facility];  Service: General;  Laterality: N/A;  . VENTRAL HERNIA REPAIR N/A 12/08/2014   Procedure: LAPAROSCOPIC INCARCERATED INCISIONAL VENTRAL WALL HERNIA REPAIR;  Surgeon: Michael Boston, MD;  Location: [facility];  Service: General;  Laterality: N/A;   Social History:  reports that she quit smoking about 2 years ago. Her smoking use included cigarettes. She has a 6.25 pack-year smoking history. She has never used smokeless tobacco. She reports current alcohol use. She reports that she does not use drugs.  Family / Support Systems Marital Status: Single Patient Roles: Parent Other Supports: mother, Rebecca Johnston (local) @ (C) [phone] Anticipated Caregiver: mother to be primary support;  pt notes she does have other family in town, however, does not feel she can rely on them for assist Ability/Limitations of Caregiver: Mom works.  49 yo son in school.  49 yo dtr at home. Caregiver Availability: Intermittent Family Dynamics: Pt notes very good relationship with her mother, however, mom works.  She is uncertain of plans for college-aged son since classes have been cancelled and students are to leave campus - not sure she can plan on him being a caregiver.  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 Legal History/Current Legal Issues: None Guardian/Conservator: None - per MD, pt is capable of making decisions on her own behalf.   Abuse/Neglect Abuse/Neglect Assessment Can Be  Completed: Yes Physical Abuse: Denies Verbal Abuse: Denies Sexual Abuse: Denies Exploitation of patient/patient's resources: Denies Self-Neglect: Denies  Emotional Status Pt's affect, behavior and adjustment status: Pt lying in bed and able to complete assessment interview without much difficulty, however, speech was slowed (?due to meds?).  Pt very concerned about her financial situation and whether she will be able to return to work.   States, "I've got so much on my mind.... so much to figure out."  Feel she would benefit from additional support of neuropsychology - will refer. Recent Psychosocial Issues: None Psychiatric History: None Substance Abuse History: None  Patient / Family Perceptions, Expectations & Goals Pt/Family understanding of illness & functional limitations: Pt reports that she speculates if medication was cause of seizures.  Good general understanding of her medical issues, current functional limitations/ need for CIR. Premorbid pt/family roles/activities: Pt completely independent and working f/t.   Anticipated changes in roles/activities/participation: Dependent on progress.  Goals currently set for supervision which will indicate that family or others with need to set up caregiver plan. Pt/family expectations/goals: "I need to be able to go back to work. Like as soon as I leave here."  [employer]: None Premorbid Home Care/DME Agencies: None Transportation available at discharge: yes Resource referrals recommended: Neuropsychology  Discharge Planning Living Arrangements: Children Support Systems: Children, Parent Type of Residence: Private residence Insurance Resources: [employer] (specify county) Pensions consultant: Employment Museum/gallery curator Screen Referred: No Living Expenses: Education officer, community Management: Patient Does the patient have any problems obtaining your medications?: No Home Management: pt Patient/Family Preliminary Plans: Pt  plans to return to her home, however, reports that mother can only provide intermittent assistance. [place] Barriers to Discharge: Lack of/limited family support [place] Barriers to Discharge Comments: will need to discuss further with pt/ family able possibility of arranging more support at home at least initially. Social Work Anticipated Follow Up Needs: HH/OP Expected length of stay: 10-12 days  Clinical Impression Unfortunate woman here after suffering seizure with questionable cause.  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  Does not have a confirmed plan for 24/7 support and goals currently set for supervision.  Will follow for d/c planning and support needs.  Rebecca Johnston 05/17/2018, 4:50 PM

## 2018-05-18 ENCOUNTER — Inpatient Hospital Stay (HOSPITAL_COMMUNITY): Payer: Medicaid Other | Admitting: Occupational Therapy

## 2018-05-18 ENCOUNTER — Inpatient Hospital Stay (HOSPITAL_COMMUNITY): Payer: Medicaid Other

## 2018-05-18 ENCOUNTER — Inpatient Hospital Stay (HOSPITAL_COMMUNITY): Payer: Medicaid Other | Admitting: Speech Pathology

## 2018-05-18 ENCOUNTER — Inpatient Hospital Stay (HOSPITAL_COMMUNITY): Payer: Medicaid Other | Admitting: Physical Therapy

## 2018-05-18 LAB — GLUCOSE, CAPILLARY
GLUCOSE-CAPILLARY: 92 mg/dL (ref 70–99)
Glucose-Capillary: 118 mg/dL — ABNORMAL HIGH (ref 70–99)
Glucose-Capillary: 132 mg/dL — ABNORMAL HIGH (ref 70–99)
Glucose-Capillary: 69 mg/dL — ABNORMAL LOW (ref 70–99)
Glucose-Capillary: 77 mg/dL (ref 70–99)
Glucose-Capillary: 146 mg/dL — ABNORMAL HIGH (ref 70–99)

## 2018-05-18 MED ORDER — TOPIRAMATE 25 MG PO TABS
25.0000 mg | ORAL_TABLET | Freq: Every day | ORAL | Status: DC
  Administered 2018-05-18 – 2018-05-19 (×2): 25 mg via ORAL
  Filled 2018-05-18 (×2): qty 1

## 2018-05-18 MED ORDER — LEVETIRACETAM 750 MG PO TABS
750.0000 mg | ORAL_TABLET | Freq: Two times a day (BID) | ORAL | Status: DC
  Administered 2018-05-18 – 2018-05-26 (×16): 750 mg via ORAL
  Filled 2018-05-18 (×16): qty 1

## 2018-05-18 MED ORDER — SORBITOL 70 % SOLN
30.0000 mL | Status: AC
  Administered 2018-05-18: 30 mL via ORAL
  Filled 2018-05-18: qty 30

## 2018-05-18 MED ORDER — SENNOSIDES-DOCUSATE SODIUM 8.6-50 MG PO TABS
2.0000 | ORAL_TABLET | Freq: Every day | ORAL | Status: DC
  Administered 2018-05-18 – 2018-05-25 (×8): 2 via ORAL
  Filled 2018-05-18 (×8): qty 2

## 2018-05-18 NOTE — Progress Notes (Signed)
Speech Language Pathology Daily Session Note  Patient Details  Name: Rebecca Johnston MRN: [id number] Date of Birth: 30-Sep-1970  Today's Date: 05/18/2018 SLP Individual Time: 8592-9244 SLP Individual Time Calculation (min): 52 min  Short Term Goals: Week 1: SLP Short Term Goal 1 (Week 1): Pt will utilize external memory aids to recall information in 8 out of 10 opportunities with Min A cues.  SLP Short Term Goal 2 (Week 1): Pt will complete semi-complex problem solving tasks with Mod A cues.  SLP Short Term Goal 3 (Week 1): Pt will demonstrate selective attention to task for ~ 15 minutes in mildly distracting environment with Mod A cues.  SLP Short Term Goal 4 (Week 1): Pt will demonstrate intellectual awareness by stating 3 physical and cognitive deficits that are related to acute condition in 8 out of 10 opportunities with Min A.  SLP Short Term Goal 5 (Week 1): Pt will utilize speech intelligibility strategies to increase speech intelligiiblity at the sentence level to ~ 90% with Min A cues.  SLP Short Term Goal 6 (Week 1): Pt will consume least restrictive diet with Mod I cues and minimal overt s/s of aspiration/dysphagia.   Skilled Therapeutic Interventions:  Pt was seen for skilled ST targeting cognitive goals.  Pt was able to identify 2 deficits/goals to be addressed during therapies (ie speech and walking) when having informal conversations with therapist.  SLP facilitated the session with a semi-complex scheduling task to address problem solving goals. Pt needed mod assist for task organization and error awareness to complete task.  Pt was left in recliner with chair alarm set and call bell within reach.  Continue per current plan of care.    Pain Pain Assessment Pain Scale: 0-10 Pain Score: 8  Pain Type: Acute pain Pain Location: Head Pain Descriptors / Indicators: Headache Pain Intervention(s): RN made aware  Therapy/Group: Individual Therapy  Emmerie Battaglia, Selinda Orion 05/18/2018, 12:32  PM

## 2018-05-18 NOTE — Progress Notes (Addendum)
NEUROLOGY PROGRESS NOTE  Subjective: Patient has no complaints other than the fact that she is still slurring her words and slow speech.  She does not feel as though she has improved.  She feels as though the Pamelor caused her to have insomnia and headache.  Exam: Vitals:   05/18/18 0143 05/18/18 0532  BP: 103/68 91/68  Pulse: 96 96  Resp: 18 15  Temp: 98.6 F (37 C) 97.9 F (36.6 C)  SpO2: 97% 99%    Physical Exam   HEENT-  Normocephalic, no lesions, without obvious abnormality.  Normal external eye and conjunctiva.   Extremities- Warm, dry and intact Musculoskeletal-no joint tenderness, deformity or swelling Skin-warm and dry, no hyperpigmentation, vitiligo, or suspicious lesions    Neuro:  Mental Status: Alert, oriented, thought content appropriate.  Speech still bradylalia and slightly dysarthric.  Able to follow 3 step commands without difficulty. Cranial Nerves: II:  Visual fields grossly normal,  III,IV, VI: ptosis not present, extra-ocular motions intact bilaterally pupils equal, round, reactive to light and accommodation V,VII: smile symmetric, facial light touch sensation normal bilaterally VIII: hearing normal bilaterally IX,X: uvula rises midline XI: bilateral shoulder shrug XII: midline tongue extension Motor: Right : Upper extremity   5/5    Left:     Upper extremity   5/5  Lower extremity   5/5     Lower extremity   5/5 Tone and bulk:normal tone throughout; no atrophy noted Sensory: Pinprick and light touch intact throughout, bilaterally    Medications:  Scheduled: . enoxaparin (LOVENOX) injection  60 mg Subcutaneous Q24H  . fluticasone  2 spray Each Nare Daily  . glimepiride  2 mg Oral Q breakfast  . insulin aspart  0-9 Units Subcutaneous TID WC  . levETIRAcetam  1,500 mg Oral BID  . pregabalin  100 mg Oral TID  . topiramate  25 mg Oral QHS    Pertinent Labs/Diagnostics: None     Assessment: Status post refractory focal status  epilepticus Todd's paralysis which is resolved.  Now complaining of right temporal headache.  Patient was started on Pamelor however stated she had insomnia and side effects secondary to this medication.  At this point some of her bradylalia very well could be secondary to the prolonged seizure.  If this is the case likely will improve over time  Recommendations: -Would recommend restarting Topamax but titrate slowly. -Would continue Lyrica at this time and titrate down to off as an outpatient - D/C Pamelor --Consider compazine for HA -At this time no further recommendations, neurology will sign off.  Please call with any questions.  Etta Quill PA-C [facility] [phone] 05/18/2018, 10:33 AM  I have seen the patient reviewed the above note.  Possible etiologies of her slow speech include medication effect which would improve fairly rapidly versus injury due to her prolonged frequent seizures which may improve some of her time, but may be incomplete.  For headaches, I would treat the semiology which is migrainous and Topamax would be a good choice for this.  When she is on a dose that could be used as an antiepileptic, 1 of her other medications could be titrated down, but I would do this slowly.  Start Topamax 25 mg twice daily Continue Lyrica at 100 mg 3 times daily Decrease Keppra to 750 twice daily (max dose for renal function) Follow-up with neurology as an outpatient  Roland Rack, MD [facility] [phone]  If 7pm- 7am, please page neurology on call as listed in [place].

## 2018-05-18 NOTE — Progress Notes (Signed)
Physical Therapy Session Note  Patient Details  Name: Rebecca Johnston MRN: [id number] Date of Birth: February 28, 1971  Today's Date: 05/18/2018 PT Individual Time: 04-1500 PT Individual Time Calculation (min): 32 min   Short Term Goals: Week 1:  PT Short Term Goal 1 (Week 1): Pt will ambulate 54' w/ LRAD and CGA PT Short Term Goal 2 (Week 1): Pt will maintain dynamic standing balance w/ min assist PT Short Term Goal 3 (Week 1): Pt will demonstrate appropriate safety awareness w/o cues 50% of the time PT Short Term Goal 4 (Week 1): Pt will participate in 60 min of upright activity w/o increase in fatigue  Skilled Therapeutic Interventions/Progress Updates:    pt states fatigue but is willing to participate in therapy.  Gait with RW 150' x 2 with min A, cues to reduce ataxia and reduce LOB especially with turns. Pt performs gait with tight turns with RW with min A, cues to decrease cadence to improve coordination.  Sit <> stand training with adductor squeeze with mod A.  Sit <> stand without UE support with min A.  Standing balance with 1 UE on 4'' step with UE diagonals with mod A 2 x 10 bilat. Pt left in recliner with alarm set, needs at hand.  Therapy Documentation Precautions:  Precautions Precautions: Fall Restrictions Weight Bearing Restrictions: No Pain: No c/o pain   Therapy/Group: Individual Therapy  Celest Reitz 05/18/2018, 3:03 PM

## 2018-05-18 NOTE — Progress Notes (Signed)
Occupational Therapy Session Note  Patient Details  Name: Rebecca Johnston MRN: [id number] Date of Birth: 03-22-1970  Today's Date: 05/18/2018 OT Individual Time: 1100-1155 OT Individual Time Calculation (min): 55 min    Short Term Goals: Week 1:  OT Short Term Goal 1 (Week 1): Pt will complete 3 grooming tasks while standing at sink to improve standing endurance  OT Short Term Goal 2 (Week 1): Pt will complete 3/3 toileting tasks with steady assist and min vcs for DME mgt  Skilled Therapeutic Interventions/Progress Updates:    Pt seen for OT ADL bathing/dressing session. Pt sitting up in recliner upon arrival, denying pain and agreeable to tx session and bathing at shower level.  She ambulated throughout session using RW, CGA for ambulation with min-mod steadying assist required for dynamic standing balance when reaching overhead to obtain items from closet and when bending down to retrieve items from low drawer.  She transferred onto tub transfer bench, one LOB episode requiring mod A to regain balance. VCs throughout for RW management, improved carryover of education on hand placement on RW during sit>stand.  She dressed seated on standard chair, set-up for UB dressing, standing at RW with steadying assist to pull pants up. Pt able to don TED hose and non-slid socks with education for TED hose.  She completed grooming tasks standing at sink with CGA in order to increase functional activity tolerance and balance.  Extensive education and discussion regarding d/c planning, pt's CLOF, return to activity, OT/PT goals, energy conservation and DME. Pt is aware of balance and endurance deficits.  Pt returned to recliner at end of session, chair belt alarm on and all needs in reach.   Therapy Documentation Precautions:  Precautions Precautions: Fall Restrictions Weight Bearing Restrictions: No Pain:   No/denies pain   Therapy/Group: Individual Therapy  Tarin Johndrow L 05/18/2018, 7:04 AM

## 2018-05-18 NOTE — Plan of Care (Signed)
Nutrition Education Note  RD consulted for nutrition education regarding diabetes. Reviewed diabetes coordinator notes.  Spoke with pt at length at bedside. Two other family members were present during education.  Pt reports that she has been a diabetic since 2018 but that she never received any nutrition education. Pt states, "I know you have to watch carbohydrates." Pt unaware of what foods contain carbohydrates other than bread. Reviewed these foods at length. Pt very surprised that milk products (milk, yogurt) and fruits contain carbohydrates.  Pt shares that she typically eats 2 meals daily and usually skips breakfast.  Breakfast: skips Lunch: 6" sub from [employer] with deli meat, mayo, onion, lettuce, tomato Dinner: pt cooks for her family (two teenage children), they may have tacos  Pt reports that she checks sugar content on Nutrition Facts Label but does not look at the carbohydrate grams. RD educated the importance of looking at grams of carbohydrates total rather than just at grams of sugar.  Pt reports that she drinks "a lot" of water and Coke Zero. Pt's family members share that pt also drinks [employer] strawberry lemonade and Pepsi. Pt states that she does not drink these often. RD pulled up Nutrition Facts Labels for [employer] juice products. Showed pt where to look for grams of carbohydrates. Discouraged pt from consuming sugar-sweetened beverages.  RD and pt practiced planning sample meals and snacks based on foods that pt typically eats. RD attempted to touch on carbohydrate counting in more detail (example: 15 grams of carbs = 1 serving). This seemed to confuse pt, so RD recommended pt look at recommended serving sizes page on the handout.  Lab Results  Component Value Date   HGBA1C 14.9 (H) 05/06/2018    RD provided "Carbohydrate Counting for People with Diabetes" handout from the Academy of Nutrition and Dietetics. Discussed different food groups and their effects on  blood sugar, emphasizing carbohydrate-containing foods. Provided list of carbohydrates and recommended serving sizes of common foods.  Discussed importance of controlled and consistent carbohydrate intake throughout the day. Provided examples of ways to balance meals/snacks and encouraged intake of high-fiber, whole grain complex carbohydrates. Teach back method used.  Pt will likely benefit from outpatient nutrition education services. Recommend referral to [facility].  Expect fair to good compliance.  Body mass index is 43.84 kg/m. Pt meets criteria for obesity class III based on current BMI.  Current diet order is Carb Modified, patient is consuming approximately 75-100% of meals at this time. Labs and medications reviewed. No further nutrition interventions warranted at this time. RD contact information provided. If additional nutrition issues arise, please re-consult RD.   Gaynell Face, MS, RD, LDN Inpatient Clinical Dietitian Pager: [phone] Weekend/After Hours: [phone]

## 2018-05-18 NOTE — Progress Notes (Signed)
[facility] PHYSICAL MEDICINE & REHABILITATION PROGRESS NOTE   Subjective/Complaints: Pt states that headache is still an issue. topamax worked better for it than TCA started last night. Anxious to get better so that she can go home  ROS: Patient denies fever, rash, sore throat, blurred vision, nausea, vomiting, diarrhea, cough, shortness of breath or chest pain, joint or back pain,  or mood change.    Objective:   No results found. Recent Labs    05/17/18 0640  WBC 7.9  HGB 9.4*  HCT 31.4*  PLT 267   Recent Labs    05/17/18 0640  NA 133*  K 3.9  CL 102  CO2 23  GLUCOSE 187*  BUN 20  CREATININE 1.61*  CALCIUM 9.1    Intake/Output Summary (Last 24 hours) at 05/18/2018 0904 Last data filed at 05/18/2018 0155 Gross per 24 hour  Intake 660 ml  Output -  Net 660 ml     Physical Exam: Vital Signs Blood pressure 91/68, pulse 96, temperature 97.9 F (36.6 C), temperature source Oral, resp. rate 15, height 5\' 5"  (1.651 m), weight 119.5 kg, last menstrual period 11/26/2014, SpO2 99 %. Constitutional: No distress . Vital signs reviewed. obese HEENT: EOMI, oral membranes moist Neck: supple Cardiovascular: RRR without murmur. No JVD    Respiratory: CTA Bilaterally without wheezes or rales. Normal effort    GI: BS +, non-tender, non-distended  Musculoskeletal:Normal range of motion. no focal tenderness General: Edema(BLE)present. No tendernessor deformity.  Neurological: More alert. Initiating a lot more. Oriented to place, reason, month. Follows all simple commands.  Moves UE3-4/5 prox to distal. LE: 3/5 prox to 4/5 distally. Sensed pain in all 4's. CN non-focal    Skin: Skin isdry. She isnot diaphoretic.  Psychiatric: Somewhat flat    Assessment/Plan: 1. Functional deficits secondary to status epilepticus/encephalopathy which require 3+ hours per day of interdisciplinary therapy in a comprehensive inpatient rehab setting.  Physiatrist is providing close  team supervision and 24 hour management of active medical problems listed below.  Physiatrist and rehab team continue to assess barriers to discharge/monitor patient progress toward functional and medical goals  Care Tool:  Bathing    Body parts bathed by patient: Right arm, Left arm, Chest, Abdomen, Front perineal area, Buttocks, Right upper leg, Left upper leg, Right lower leg, Left lower leg, Face         Bathing assist Assist Level: Moderate Assistance - Patient 50 - 74%     Upper Body Dressing/Undressing Upper body dressing   What is the patient wearing?: Bra, Pull over shirt    Upper body assist Assist Level: Set up assist    Lower Body Dressing/Undressing Lower body dressing      What is the patient wearing?: Underwear/pull up, Pants     Lower body assist Assist for lower body dressing: Contact Guard/Touching assist     Toileting Toileting Toileting Activity did not occur (Clothing management and hygiene only): N/A (no void or bm)  Toileting assist Assist for toileting: Minimal Assistance - Patient > 75%     Transfers Chair/bed transfer  Transfers assist     Chair/bed transfer assist level: Minimal Assistance - Patient > 75%     Locomotion Ambulation   Ambulation assist      Assist level: Minimal Assistance - Patient > 75% Assistive device: Walker-rolling Max distance: 115'   Walk 10 feet activity   Assist  Walk 10 feet activity did not occur: Safety/medical concerns  Assist level: Minimal Assistance - Patient >  75% Assistive device: Walker-rolling   Walk 50 feet activity   Assist Walk 50 feet with 2 turns activity did not occur: Safety/medical concerns  Assist level: Minimal Assistance - Patient > 75% Assistive device: Walker-rolling    Walk 150 feet activity   Assist Walk 150 feet activity did not occur: Safety/medical concerns         Walk 10 feet on uneven surface  activity   Assist Walk 10 feet on uneven surfaces  activity did not occur: Safety/medical concerns         Wheelchair     Assist Will patient use wheelchair at discharge?: No   Wheelchair activity did not occur: N/A         Wheelchair 50 feet with 2 turns activity    Assist    Wheelchair 50 feet with 2 turns activity did not occur: N/A       Wheelchair 150 feet activity     Assist Wheelchair 150 feet activity did not occur: N/A        Medical Problem List and Plan: 1.Decreased functional mobility with headache dizzinesssecondary to refractory focal status epilepticus possibly precipitated by severe hyperglycemia.Additionally debilitated from multiple medical issues and prolonged hospital stay --Continue CIR therapies including PT, OT, and SLP   -team conference today.  2. Antithrombotics: -DVT/anticoagulation:Subcutaneous Lovenox. Monitor for bleeding episodes -antiplatelet therapy: none 3. Pain Management:c/o of right temporal headache (which was BEFORE the seizures). Could be some form of vascular headache or certainly related to seizures.    -nortriptyline initiated last night per neurology in place of topamax. Pt feels she did better with the topamax. Will resume tonight 4. Mood:provide emotional support -antipsychotic agents: none 5. Neuropsych: This patientiscapable of making decisions on herown behalf. 6. Skin/Wound Care:Routine skin checks 7. Fluids/Electrolytes/Nutrition:   -encourage PO intake  -I personally reviewed the patient's labs today.    -dietary consult  -hyponatremia 133---follow up labs Wednesday  8. Seizures/status epilepticus potentially due to hyperglycemia:  -appreciate neuro follow up  - Keppra 1500 mg twice a day.   -reduced Lyrica 100 mg 3 times a day---follow for improvement of neurosedating side effects  Pt off phenobarbital and dilantin -no further seizure activity   9.Diabetes  mellitus with peripheral neuropathy. Hemoglobin A1c 14.9.  -hypoglycemic yesterday after amaryl. .    -continue amaryl 2mg  daily  -due to improvement with amaryl, will hold lantus 10u at bedtime  -CM diet 10. Dysphagia. Diet advanced to regular. 11. Asthma with remote history of tobacco abuse. Continue nebulizers 12. Morbid obesity. BMI 43.44. Dietary follow-up 13.CKD stage III.  BUN/Cr drifting up  -encourage fluids  -follow up labs ordered for Wednesday    LOS: 4 days A FACE TO FACE EVALUATION WAS PERFORMED  Meredith Staggers 05/18/2018, 9:04 AM

## 2018-05-18 NOTE — Care Management (Signed)
[facility] Individual Statement of Services  Patient Name:  CLAUDINA OLIPHANT  Date:  05/18/2018  Welcome to the [facility].  Our goal is to provide you with an individualized program based on your diagnosis and situation, designed to meet your specific needs.  With this comprehensive rehabilitation program, you will be expected to participate in at least 3 hours of rehabilitation therapies Monday-Friday, with modified therapy programming on the weekends.  Your rehabilitation program will include the following services:  Physical Therapy (PT), Occupational Therapy (OT), Speech Therapy (ST), 24 hour per day rehabilitation nursing, Therapeutic Recreaction (TR), Neuropsychology, Case Management (Social Worker), Rehabilitation Medicine, Nutrition Services and Pharmacy Services  Weekly team conferences will be held on Tuesdays to discuss your progress.  Your Social Worker will talk with you frequently to get your input and to update you on team discussions.  Team conferences with you and your family in attendance may also be held.  Expected length of stay: 10-12 days   Overall anticipated outcome: supervision  Depending on your progress and recovery, your program may change. Your Social Worker will coordinate services and will keep you informed of any changes. Your Social Worker's name and contact numbers are listed  below.  The following services may also be recommended but are not provided by the [facility] will be made to provide these services after discharge if needed.  Arrangements include referral to agencies that provide these services.  Your insurance has been verified to be:  Medicaid Your primary doctor is:  [facility]  Pertinent information will be shared with your  doctor and your insurance company.  Social Worker:  [place], [place] or ([phone]   Information discussed with and copy given to patient by: Lennart Pall, 05/18/2018, 4:16 PM

## 2018-05-18 NOTE — Progress Notes (Signed)
Pt noted resting quietly upon reassessment.Will continue to monitor.

## 2018-05-18 NOTE — Progress Notes (Signed)
Inpatient Diabetes Program Recommendations  AACE/ADA: New Consensus Statement on Inpatient Glycemic Control   Target Ranges:  Prepandial:   less than 140 mg/dL      Peak postprandial:   less than 180 mg/dL (1-2 hours)      Critically ill patients:  140 - 180 mg/dL   Results for KORRIN, WATERFIELD (MRN [id number]) as of 05/18/2018 08:14  Ref. Range 05/17/2018 06:21 05/17/2018 12:41 05/17/2018 17:23 05/17/2018 17:51 05/17/2018 20:40 05/17/2018 21:54 05/18/2018 01:39 05/18/2018 06:34  Glucose-Capillary Latest Ref Range: 70 - 99 mg/dL 207 (H) 108 (H) 68 (L) 105 (H) 130 (H) 120 (H) 118 (H) 132 (H)   Review of Glycemic Control  Diabetes history: DM2 Outpatient Diabetes medications: Amaryl 2 mg daily Current orders for Inpatient glycemic control: Lantus 10 units daily, Novolog 0-9 units TID with meals, Amaryl 2 mg QAM   Inpatient Diabetes Program Recommendations:  Insulin - Basal: Since Amaryl was restarted on 05/17/18 and patient experienced hypoglycemia on 05/17/18, please consider discontinuing Lantus at this time and see how glucose trends with Amaryl and Novolog correction.  Correction (SSI): Please consider ordering Novolog 0-5 units QHS for bedtime correction.  HgbA1C: A1C 14.9% on 05/06/18. Patient was seen by Inpatient Diabetes Coordinator several times during hospital admission (seen last on 05/17/18).  A1C of 14.9% on 05/06/18 indicating an average glucose of 381 mg/dl.  A1C does not correlate with noted glucose trends over the past several days (with just Lantus 10 units daily and Novolog correction). Will follow trends with Amaryl being added on 05/17/18.  Thanks, Barnie Alderman, RN, MSN, CDE Diabetes Coordinator Inpatient Diabetes Program [phone] (Team Pager from 8am to 5pm)

## 2018-05-18 NOTE — Progress Notes (Signed)
Physical Therapy Session Note  Patient Details  Name: Rebecca Johnston MRN: [id number] Date of Birth: [birth year]  Today's Date: 05/18/2018 PT Individual Time: 0830-0930 PT Individual Time Calculation (min): 60 min   Short Term Goals: Week 1:  PT Short Term Goal 1 (Week 1): Pt will ambulate 40' w/ LRAD and CGA PT Short Term Goal 2 (Week 1): Pt will maintain dynamic standing balance w/ min assist PT Short Term Goal 3 (Week 1): Pt will demonstrate appropriate safety awareness w/o cues 50% of the time PT Short Term Goal 4 (Week 1): Pt will participate in 60 min of upright activity w/o increase in fatigue  Skilled Therapeutic Interventions/Progress Updates:    Patient in supine with RN in room delivering medications.  No specific pain complaints, but reported to Neuro NP she has headache unrelieved by medications overnight.  Performed supine to sit with S.  Donned knee hi TEDS and slipper socks max A.  Pants with min A for sit to stand/balance and shirt seated with S.  Gait to bathroom with RW min A for balance/safety/enviornmental set up.  Patient toileted with min A overall for transfer, balance with donning pants.  Gait with RW and min A to gym 150' noting some difficulty with balance, with ataxic quality and wide BOS.  Patient performed seated and supine core strengthening activities to include sitting on sit disc for UE and LE movement, then with weighted ball for diagonals.  Supine with legs on blue therapy ball for trunk rotation, bridging, alternate arm & leg lift and trunk flexion rolling ball under feet towards hips.  Patient performed standing pelvic tilts with education about standing with more active muscular support instead of locking out on ligaments.  Gait to room with RW and min A improved pelvic and LE positioning with less hyperextension at knees and more neutral posture.  Patient seated in recliner with alarm belt on and call button in reach preparing for next therapy session.   Therapy  Documentation Precautions:  Precautions Precautions: Fall Restrictions Weight Bearing Restrictions: No     Therapy/Group: Individual Therapy  Reginia Naas  Magda Kiel, PT 05/18/2018, 5:10 PM

## 2018-05-18 NOTE — Progress Notes (Signed)
Patient requested to speak with this nurse, reports that she has a headache and then states "I haven't been sleeping and I don't feel right, maybe that medicine that you gave me is keeping me up, I hope I am not having any side effects", pt then states "maybe I'm depressed, I have a lot on my mind, they keep telling me all this stuff that I may not be able to do again, like drive, I'm trying to get better". Pt tearful at this time, emotional support and reassurance provided by this nurse. Pt's vital signs and blood glucose stable, will continue to monitor. Given tylenol 650 mg po for headache, will monitor for effectiveness.

## 2018-05-18 NOTE — Significant Event (Signed)
Hypoglycemic Event  CBG: 69  Treatment: 4 oz juice/soda  Symptoms: None  Follow-up CBG: Time:1244 CBG Result:92  Possible Reasons for Event: Unknown  Comments/MD notified: Patient received juice and meal tray. PA aware and no new orders received.    Creig Hines, Susa Raring

## 2018-05-19 ENCOUNTER — Inpatient Hospital Stay (HOSPITAL_COMMUNITY): Payer: Medicaid Other | Admitting: Physical Therapy

## 2018-05-19 ENCOUNTER — Inpatient Hospital Stay (HOSPITAL_COMMUNITY): Payer: Medicaid Other | Admitting: Occupational Therapy

## 2018-05-19 ENCOUNTER — Inpatient Hospital Stay (HOSPITAL_COMMUNITY): Payer: Medicaid Other | Admitting: Speech Pathology

## 2018-05-19 LAB — GLUCOSE, CAPILLARY
GLUCOSE-CAPILLARY: 116 mg/dL — AB (ref 70–99)
Glucose-Capillary: 91 mg/dL (ref 70–99)
Glucose-Capillary: 78 mg/dL (ref 70–99)

## 2018-05-19 NOTE — Progress Notes (Signed)
Physical Therapy Session Note  Patient Details  Name: Rebecca Johnston MRN: [id number] Date of Birth: Aug 08, 1970  Today's Date: 05/19/2018 PT Individual Time: 1450-1550 PT Individual Time Calculation (min): 60 min   Short Term Goals: Week 1:  PT Short Term Goal 1 (Week 1): Pt will ambulate 41' w/ LRAD and CGA PT Short Term Goal 2 (Week 1): Pt will maintain dynamic standing balance w/ min assist PT Short Term Goal 3 (Week 1): Pt will demonstrate appropriate safety awareness w/o cues 50% of the time PT Short Term Goal 4 (Week 1): Pt will participate in 60 min of upright activity w/o increase in fatigue  Skilled Therapeutic Interventions/Progress Updates:    Pt received seated in recliner in room, agreeable to PT session. Pt reports ongoing HA with no relief, medical team aware. Sit to stand with CGA to RW. Ambulation x 200 ft with RW and CGA, decreased gait speed, some ataxia and path deviation. Ascend/descend 12 stairs with 2 handrails and CGA, step-through gait pattern. Trial gait with rail in hallway and min A, increase in ataxia noted. Trial gait with no AD and min to mod A for balance, increase in ataxia and path deviation. Education with patient about continued use of AD for safety with gait but that therapy sessions with focus on balance training. Biodex limits of stability level 1 with BUE support, CGA, verbal cues for use of ankle strategy as pt performs multidirectional weight shift according to visual cues. Pt scores 20%, 38%, 74%, then 69% with decreased cues needed as she progressed and improved ability to utilize ankle strategy noted. Ambulation with RW through obstacle course navigating cones with CGA, v/c to avoid obstacles and for safe RW management. Pt left seated in recliner in room with needs in reach, quick release belt and chair alarm in place at end of session.  Therapy Documentation Precautions:  Precautions Precautions: Fall Restrictions Weight Bearing Restrictions:  No    Therapy/Group: Individual Therapy   Excell Seltzer, PT, DPT  05/19/2018, 4:10 PM

## 2018-05-19 NOTE — Patient Care Conference (Signed)
Inpatient RehabilitationTeam Conference and Plan of Care Update Date: 05/18/2018   Time: 2:20 PM    Patient Name: Rebecca Johnston      Medical Record Number: [id number]  Date of Birth: 10/15/1970 Sex: Female         Room/Bed: 4W10C/4W10C-01 Payor Info: Payor: MEDICAID [place] / Plan: MEDICAID [place] ACCESS / Product Type: *No Product type* /    Admitting Diagnosis: ha  Admit Date/Time:  05/14/2018  5:38 PM Admission Comments: No comment available   Primary Diagnosis:  <principal problem not specified> Principal Problem: <principal problem not specified>  Patient Active Problem List   Diagnosis Date Noted  . Seizure disorder ([place]) 05/14/2018  . Status epilepticus ([place])   . Headache 05/07/2018  . Lobar pneumonia ([place]) 05/07/2018  . GERD (gastroesophageal reflux disease) 05/07/2018  . Hyponatremia 05/07/2018  . HA (headache) 05/07/2018  . Hyperglycemia   . CKD (chronic kidney disease), stage III ([place]) 04/16/2017  . Neuropathy 04/04/2016  . Hx of iron deficiency anemia 12/21/2015  . Seasonal allergic rhinitis 12/21/2015  . Type II diabetes mellitus with renal manifestations ([place]) 12/21/2015  . Incarcerated hernia s/p lap repair w mesh 12/08/2014 12/08/2014  . Esophageal reflux 10/21/2013  . Abnormal CT scan, chest 09/02/2013    Expected Discharge Date: Expected Discharge Date: 05/26/18  Team Members Present: Physician leading conference: Dr. Alger Simons Social Worker Present: Lennart Pall, LCSW Nurse Present: Dorthula Nettles, RN PT Present: Other (comment)(Taylor Ervin Knack, PT) OT Present: Amy Rounds, OT SLP Present: Windell Moulding, SLP PPS Coordinator present : Gunnar Fusi     Current Status/Progress Goal Weekly Team Focus  Medical   Patient admitted after right temporal status epilepticus with associated encephalopathy and debility.  Persistent headaches.  Some side effects from medications as well.  Improve activity tolerance  Seizure management, pain management    Bowel/Bladder   Continent of bowel and bladder LBM-05/16/18  Maintain regular bowel and urinary pattern  Assist with toileting needs   Swallow/Nutrition/ Hydration             ADL's   min A with dynamic standing balance without UE support, CGA with UE support during ADLs, pt is set up from a seated position  Supervision with all BADLs and mobility  ADL training, balance, postural control, strength, conditioning, pt education   Mobility   min A transfers, min A gait with RW up to 115'  Supervision overall  balance, coordination, safety awareness   Communication   mildly dysarthric, could be med related   min assist   education and carryover of strategies, monitoring for changes following medication adjustments   Safety/Cognition/ Behavioral Observations  min-mod assist   min assist   mildly complex problem solving, attention, memory, awareness   Pain   Frequent complaints of headache, given tylenol prn, followed by neurology, recently started on nortryptyline to assist with management of pain  Pain level <5/10.   Assess pain every shift and prn, treat prn.    Skin   no skin issues noted  maintain skin integrity  Assess skin every shift and prn    Rehab Goals Patient on target to meet rehab goals: Yes *See Care Plan and progress notes for long and short-term goals.     Barriers to Discharge  Current Status/Progress Possible Resolutions Date Resolved   Physician    Medical stability        See medical problem list      Nursing  PT  Inaccessible home environment;Decreased caregiver support;Home environment access/layout;Lack of/limited family support  Pt's mother works during day, pt states this is the only person that can provide any assist. Pt lives on 2nd floor apartment.               OT Decreased caregiver support;Medical stability;Home environment access/layout(10 steps to enter 2nd level apartment)                SLP Decreased caregiver support;Lack  of/limited family support her mother works during the day and pt is single mother to 55 year old daughter in the home            SW Lack of/limited family support will need to discuss further with pt/ family able possibility of arranging more support at home at least initially.            Discharge Planning/Teaching Needs:  Pt to return to her home, however, reports that family cannot provide 24/7 assistance as her mother still works.  To discuss further and monitor if goals might be able to be upgraded.  Teaching needs TBD.   Team Discussion:  Sz; pna; HA in sz areas and MD adjusting medications.  Cont b/b;  Pain meds may be affecting speech.  ?visual issues.  CGA with amb and b/d.  Impaired balance.  Mod assist problem solving and organization.  Supervision goals at this time.  SW to follow with pt about need to arrange 24/7 coverage of support.  Revisions to Treatment Plan:  NA    Continued Need for Acute Rehabilitation Level of Care: The patient requires daily medical management by a physician with specialized training in physical medicine and rehabilitation for the following conditions: Daily direction of a multidisciplinary physical rehabilitation program to ensure safe treatment while eliciting the highest outcome that is of practical value to the patient.: Yes Daily medical management of patient stability for increased activity during participation in an intensive rehabilitation regime.: Yes Daily analysis of laboratory values and/or radiology reports with any subsequent need for medication adjustment of medical intervention for : Neurological problems   I attest that I was present, lead the team conference, and concur with the assessment and plan of the team.   Dayne Chait, [place] 05/19/2018, 12:44 PM

## 2018-05-19 NOTE — Progress Notes (Addendum)
Speech Language Pathology Daily Session Note  Patient Details  Name: Rebecca Johnston MRN: [id number] Date of Birth: 14-Aug-1970  Today's Date: 05/19/2018 SLP Individual Time: 1015-1045 SLP Individual Time Calculation (min): 30 min  Short Term Goals: Week 1: SLP Short Term Goal 1 (Week 1): Pt will utilize external memory aids to recall information in 8 out of 10 opportunities with Min A cues.  SLP Short Term Goal 2 (Week 1): Pt will complete semi-complex problem solving tasks with Mod A cues.  SLP Short Term Goal 3 (Week 1): Pt will demonstrate selective attention to task for ~ 15 minutes in mildly distracting environment with Mod A cues.  SLP Short Term Goal 4 (Week 1): Pt will demonstrate intellectual awareness by stating 3 physical and cognitive deficits that are related to acute condition in 8 out of 10 opportunities with Min A.  SLP Short Term Goal 5 (Week 1): Pt will utilize speech intelligibility strategies to increase speech intelligiiblity at the sentence level to ~ 90% with Min A cues.  SLP Short Term Goal 6 (Week 1): Pt will consume least restrictive diet with Mod I cues and minimal overt s/s of aspiration/dysphagia.   Skilled Therapeutic Interventions:  Pt was seen for skilled ST targeting speech goals.  Pt was in recliner upon therapist's arrival.  Speech and mentation were less clear in comparison to earlier AM therapy session which SLP suspects to be related to administration of morning medications as pt had not received meds at the time of pt's first appointment.  Pt continues to endorse concerns regarding speech sounding "slow."  SLP explained that speech intelligibility was likely an effect of medications and should clear as medications are adjusted.  In the mean time, SLP reviewed intelligibility strategies that pt can use in the mean time to improve functional communication, emphasizing overarticulation.  Pt was 100% intelligible in a mildly distracting environment with mod I use of  speech intelligibility strategies; however, articulation remains imprecise.  Pt was returned to room and left in recliner with chair alarm set.  Continue per current plan of care.     Pain Pain Assessment Pain Scale: 0-10 Pain Score: 3  Pain Type: Acute pain Pain Location: Head Pain Orientation: Right Pain Descriptors / Indicators: Headache Pain Intervention(s): Other (Comment)(premedicated prior to therapist's arrival)  Therapy/Group: Individual Therapy  Lamonica Trueba, Selinda Orion 05/19/2018, 10:56 AM

## 2018-05-19 NOTE — Progress Notes (Signed)
Social Work Patient ID: Rebecca Johnston, female   DOB: [birth year], 48 y.o.   MRN: [id number]  Have reviewed team conference with pt who is aware and agreeable to targeted d/c date of 3/25 and supervision goals.  She understands the reasoning of setting supervision goals and will talk with her mother about assisting her 32 y.o. son in covering this.  She does feel "a little clearer" today. Will continue to follow.  Akbar Sacra, LCSW

## 2018-05-19 NOTE — Progress Notes (Signed)
Occupational Therapy Session Note  Patient Details  Name: Rebecca Johnston MRN: [id number] Date of Birth: [birth year]  Today's Date: 05/19/2018 OT Individual Time: 1165-7903 OT Individual Time Calculation (min): 60 min    Short Term Goals: Week 1:  OT Short Term Goal 1 (Week 1): Pt will complete 3 grooming tasks while standing at sink to improve standing endurance  OT Short Term Goal 2 (Week 1): Pt will complete 3/3 toileting tasks with steady assist and min vcs for DME mgt  Skilled Therapeutic Interventions/Progress Updates:    Pt seen for OT ADL bathing/dressing session. Pt sitting up in recliner upon arrival, agreeable to tx session. Cont with complaints of headache, reports RN administered pain medication prior to start of tx session and willing to cont without intervention. She ambulated throughout session using RW with CGA. Improved dynamic standing abilities when gathering clothing items in prep for showering task, consistently CGA-min A for dynamic standing balance.  She bathed seated on tub transfer bench with supervision, standing with support of grab bar to complete pericare/buttock hygiene. Pt reuired VCs for problem solving of shower featurues including managing water controls.  She returned to standard chair to dress, min A for positioning hips fully over chair. Throughout sessiession, pt misjuding self to chair when sitting, overshooting to L and requiring VCs for safety. UB dressing completed with set-up assist, LB with guarding assist when standing at RW to pull pants up.  She completed hair care, oral care, and face washing standing at sink with close supervision for balance. Pt tolerating ~5 minutes in standing before requiring seated rest break.  She ambulated throughout unit with RW and CGA, pt with improved ambulation speed and awareness of environmental obstacles.  She returned to room at end of session, left seated in recliner at end of session, all needs in reach with chair  alarm on.   Therapy Documentation Precautions:  Precautions Precautions: Fall Restrictions Weight Bearing Restrictions: No   Therapy/Group: Individual Therapy  Aashi Derrington L 05/19/2018, 6:46 AM

## 2018-05-19 NOTE — Progress Notes (Signed)
[place] PHYSICAL MEDICINE & REHABILITATION PROGRESS NOTE   Subjective/Complaints: Pt up in bed. Headaches are more intermittent, improved from yesterday. Had a pretty good day with therapy  ROS: Patient denies fever, rash, sore throat, blurred vision, nausea, vomiting, diarrhea, cough, shortness of breath or chest pain, joint or back pain,  or mood change. .    Objective:   No results found. Recent Labs    05/17/18 0640  WBC 7.9  HGB 9.4*  HCT 31.4*  PLT 267   Recent Labs    05/17/18 0640  NA 133*  K 3.9  CL 102  CO2 23  GLUCOSE 187*  BUN 20  CREATININE 1.61*  CALCIUM 9.1    Intake/Output Summary (Last 24 hours) at 05/19/2018 0848 Last data filed at 05/19/2018 0700 Gross per 24 hour  Intake 960 ml  Output -  Net 960 ml     Physical Exam: Vital Signs Blood pressure 116/66, pulse 89, temperature 98.3 F (36.8 C), temperature source Oral, resp. rate 18, height 5\' 5"  (1.651 m), weight 120.2 kg, last menstrual period 11/26/2014, SpO2 95 %. Constitutional: No distress . Vital signs reviewed. HEENT: EOMI, oral membranes moist Neck: supple Cardiovascular: RRR without murmur. No JVD    Respiratory: CTA Bilaterally without wheezes or rales. Normal effort    GI: BS +, non-tender, non-distended   Musculoskeletal:Normal range of motion. no focal tenderness General: Edema(BLE)present. No tendernessor deformity.  Neurological: Alert. Speech less dysarthric. Seems to be initiating better and displaying better insight.   Moves UE 4/5 prox to distal. LE: 4/5 prox to 4/5 distally. Sensed pain in all 4's. CN non-focal    Skin: Skin isdry. She isnot diaphoretic.  Psychiatric: Pleasant, more dynamic    Assessment/Plan: 1. Functional deficits secondary to status epilepticus/encephalopathy which require 3+ hours per day of interdisciplinary therapy in a comprehensive inpatient rehab setting.  Physiatrist is providing close team supervision and 24 hour management  of active medical problems listed below.  Physiatrist and rehab team continue to assess barriers to discharge/monitor patient progress toward functional and medical goals  Care Tool:  Bathing    Body parts bathed by patient: Right arm, Left arm, Chest, Abdomen, Front perineal area, Buttocks, Right upper leg, Left upper leg, Right lower leg, Left lower leg, Face         Bathing assist Assist Level: Supervision/Verbal cueing     Upper Body Dressing/Undressing Upper body dressing   What is the patient wearing?: Bra, Pull over shirt    Upper body assist Assist Level: Set up assist    Lower Body Dressing/Undressing Lower body dressing      What is the patient wearing?: Underwear/pull up, Pants     Lower body assist Assist for lower body dressing: Contact Guard/Touching assist     Toileting Toileting Toileting Activity did not occur (Clothing management and hygiene only): N/A (no void or bm)  Toileting assist Assist for toileting: Minimal Assistance - Patient > 75%     Transfers Chair/bed transfer  Transfers assist     Chair/bed transfer assist level: Contact Guard/Touching assist     Locomotion Ambulation   Ambulation assist      Assist level: Minimal Assistance - Patient > 75% Assistive device: Walker-rolling Max distance: 150'   Walk 10 feet activity   Assist  Walk 10 feet activity did not occur: Safety/medical concerns  Assist level: Minimal Assistance - Patient > 75% Assistive device: Walker-rolling   Walk 50 feet activity   Assist Walk 50  feet with 2 turns activity did not occur: Safety/medical concerns  Assist level: Minimal Assistance - Patient > 75% Assistive device: Walker-rolling    Walk 150 feet activity   Assist Walk 150 feet activity did not occur: Safety/medical concerns  Assist level: Minimal Assistance - Patient > 75% Assistive device: Walker-rolling    Walk 10 feet on uneven surface  activity   Assist Walk 10 feet  on uneven surfaces activity did not occur: Safety/medical concerns         Wheelchair     Assist Will patient use wheelchair at discharge?: No   Wheelchair activity did not occur: N/A         Wheelchair 50 feet with 2 turns activity    Assist    Wheelchair 50 feet with 2 turns activity did not occur: N/A       Wheelchair 150 feet activity     Assist Wheelchair 150 feet activity did not occur: N/A        Medical Problem List and Plan: 1.Decreased functional mobility with headache dizzinesssecondary to 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 Antithrombotics: -DVT/anticoagulation:Subcutaneous Lovenox. Monitor for bleeding episodes -antiplatelet therapy: none 3. Pain Management:c/o of right temporal headache (which was BEFORE the seizures). Could be some form of vascular headache or certainly related to seizures.    -resumed topamax 3/17 with improved headache. Neurology recs bid dosing, I would prefer staying to HS given AMS with anticonvulsants and benefits so far with the HS dose 4. Mood:provide emotional support  -antipsychotic agents: none 5. Neuropsych: This patientiscapable of making decisions on herown behalf. 6. Skin/Wound Care:Routine skin checks 7. Fluids/Electrolytes/Nutrition:   -encourage PO intake  -I personally reviewed the patient's labs today.    -dietary consult  -hyponatremia 133---follow up labs Wednesday  8. Seizures/status epilepticus potentially due to hyperglycemia:  -appreciate neuro follow up, they have signed off  - keppra reduced to 750mg  bid   -reduced Lyrica 100 mg 3 times a day  -I see improvement with reduced dosing already  -Pt  off phenobarbital and dilantin -no further seizure activity   9.Diabetes mellitus with peripheral neuropathy. Hemoglobin A1c 14.9.    -improving control.    -continue amaryl 2mg  daily  -due to improvement with amaryl, I held lantus  -CM diet, dietary ed 10. Dysphagia. Diet advanced to regular. 11. Asthma with remote history of tobacco abuse. Continue nebulizers 12. Morbid obesity. BMI 43.44. Dietary follow-up 13.CKD stage III.  BUN/Cr drifting up  -encourage fluids  -ordered labs for Thursday    LOS: 5 days A FACE TO FACE EVALUATION WAS PERFORMED  Meredith Staggers 05/19/2018, 8:48 AM

## 2018-05-19 NOTE — Progress Notes (Signed)
Speech Language Pathology Daily Session Note  Patient Details  Name: Rebecca Johnston MRN: [id number] Date of Birth: Jun 20, 1970  Today's Date: 05/19/2018 SLP Individual Time: 2023-3435 SLP Individual Time Calculation (min): 40 min  Short Term Goals: Week 1: SLP Short Term Goal 1 (Week 1): Pt will utilize external memory aids to recall information in 8 out of 10 opportunities with Min A cues.  SLP Short Term Goal 2 (Week 1): Pt will complete semi-complex problem solving tasks with Mod A cues.  SLP Short Term Goal 3 (Week 1): Pt will demonstrate selective attention to task for ~ 15 minutes in mildly distracting environment with Mod A cues.  SLP Short Term Goal 4 (Week 1): Pt will demonstrate intellectual awareness by stating 3 physical and cognitive deficits that are related to acute condition in 8 out of 10 opportunities with Min A.  SLP Short Term Goal 5 (Week 1): Pt will utilize speech intelligibility strategies to increase speech intelligiiblity at the sentence level to ~ 90% with Min A cues.  SLP Short Term Goal 6 (Week 1): Pt will consume least restrictive diet with Mod I cues and minimal overt s/s of aspiration/dysphagia.   Skilled Therapeutic Interventions:  Pt was seen for skilled ST targeting cognitive goals.  Pt was in bed upon arrival with nurse tech at bedside.  Pt was agreeable to getting up for therapies but attempted to let down her bedrail while laying against it, significantly increasing her risk of fall from the bed.  Therapist encouraged pt to wait for assistance for putting down her bedrail.  Pt ambulated to recliner with rolling walker and min assist for safety.  SLP facilitated the session with a novel card game targeting problem solving goals.  Pt was able to plan and execute a problem solving strategy with min assist faded to supervision verbal cues.  Pt was left in recliner with chair alarm set and call bell within reach.  Continue per current plan of care.    Pain Pain  Assessment Pain Scale: 0-10 Pain Score: 8  Pain Location: Head Pain Descriptors / Indicators: Headache Pain Intervention(s): RN made aware  Therapy/Group: Individual Therapy  Lahoma Constantin, Selinda Orion 05/19/2018, 8:44 AM

## 2018-05-20 ENCOUNTER — Inpatient Hospital Stay (HOSPITAL_COMMUNITY): Payer: Medicaid Other | Admitting: Occupational Therapy

## 2018-05-20 ENCOUNTER — Inpatient Hospital Stay (HOSPITAL_COMMUNITY): Payer: Medicaid Other | Admitting: Physical Therapy

## 2018-05-20 ENCOUNTER — Inpatient Hospital Stay (HOSPITAL_COMMUNITY): Payer: Medicaid Other | Admitting: Speech Pathology

## 2018-05-20 LAB — BASIC METABOLIC PANEL
Anion gap: 10 (ref 5–15)
BUN: 19 mg/dL (ref 6–20)
CO2: 21 mmol/L — AB (ref 22–32)
Calcium: 9.2 mg/dL (ref 8.9–10.3)
Chloride: 103 mmol/L (ref 98–111)
Creatinine, Ser: 1.74 mg/dL — ABNORMAL HIGH (ref 0.44–1.00)
GFR calc non Af Amer: 34 mL/min — ABNORMAL LOW (ref >60)
GFR, EST AFRICAN AMERICAN: 39 mL/min — AB (ref >60)
Glucose, Bld: 124 mg/dL — ABNORMAL HIGH (ref 70–99)
Potassium: 4.5 mmol/L (ref 3.5–5.1)
Sodium: 134 mmol/L — ABNORMAL LOW (ref 135–145)

## 2018-05-20 LAB — GLUCOSE, CAPILLARY
GLUCOSE-CAPILLARY: 58 mg/dL — AB (ref 70–99)
Glucose-Capillary: 119 mg/dL — ABNORMAL HIGH (ref 70–99)
Glucose-Capillary: 111 mg/dL — ABNORMAL HIGH (ref 70–99)
Glucose-Capillary: 134 mg/dL — ABNORMAL HIGH (ref 70–99)

## 2018-05-20 MED ORDER — TOPIRAMATE 25 MG PO TABS
25.0000 mg | ORAL_TABLET | Freq: Two times a day (BID) | ORAL | Status: DC
  Administered 2018-05-20 – 2018-05-24 (×9): 25 mg via ORAL
  Filled 2018-05-20 (×9): qty 1

## 2018-05-20 NOTE — Progress Notes (Signed)
[place] PHYSICAL MEDICINE & REHABILITATION PROGRESS NOTE   Subjective/Complaints: Headaches are intermittent, but she's still having. Feels that she was able to do more in therapy yesterday  ROS: Patient denies fever, rash, sore throat, blurred vision, nausea, vomiting, diarrhea, cough, shortness of breath or chest pain, joint or back pain, headache, or mood change.    Objective:   No results found. No results for input(s): WBC, HGB, HCT, PLT in the last 72 hours. Recent Labs    05/20/18 0535  NA 134*  K 4.5  CL 103  CO2 21*  GLUCOSE 124*  BUN 19  CREATININE 1.74*  CALCIUM 9.2    Intake/Output Summary (Last 24 hours) at 05/20/2018 1215 Last data filed at 05/19/2018 2000 Gross per 24 hour  Intake 360 ml  Output -  Net 360 ml     Physical Exam: Vital Signs Blood pressure 104/63, pulse 83, temperature 97.7 F (36.5 C), temperature source Oral, resp. rate 18, height 5\' 5"  (1.651 m), weight 119.3 kg, last menstrual period 11/26/2014, SpO2 98 %. Constitutional: No distress . Vital signs reviewed. HEENT: EOMI, oral membranes moist Neck: supple Cardiovascular: RRR without murmur. No JVD    Respiratory: CTA Bilaterally without wheezes or rales. Normal effort    GI: BS +, non-tender, non-distended  Musculoskeletal:Normal range of motion. no focal tenderness General: Edema(BLE)present. No tendernessor deformity.  Neurological: Alert. Speech less dysarthric. Seems to be initiating better and displaying better insight and improved processing speeds.    Moves UE 4/5 prox to distal. LE: 4/5 prox to 4/5 distally. Sensed pain in all 4's. CN non-focal    Skin: Skin isdry. She isnot diaphoretic.  Psychiatric: Affect more dynamic    Assessment/Plan: 1. Functional deficits secondary to status epilepticus/encephalopathy which require 3+ hours per day of interdisciplinary therapy in a comprehensive inpatient rehab setting.  Physiatrist is providing close team  supervision and 24 hour management of active medical problems listed below.  Physiatrist and rehab team continue to assess barriers to discharge/monitor patient progress toward functional and medical goals  Care Tool:  Bathing    Body parts bathed by patient: Right arm, Left arm, Chest, Abdomen, Front perineal area, Buttocks, Right upper leg, Left upper leg, Right lower leg, Left lower leg, Face         Bathing assist Assist Level: Supervision/Verbal cueing     Upper Body Dressing/Undressing Upper body dressing   What is the patient wearing?: Bra, Pull over shirt    Upper body assist Assist Level: Independent    Lower Body Dressing/Undressing Lower body dressing      What is the patient wearing?: Underwear/pull up, Pants     Lower body assist Assist for lower body dressing: Supervision/Verbal cueing     Toileting Toileting Toileting Activity did not occur (Clothing management and hygiene only): N/A (no void or bm)  Toileting assist Assist for toileting: Contact Guard/Touching assist     Transfers Chair/bed transfer  Transfers assist     Chair/bed transfer assist level: Contact Guard/Touching assist     Locomotion Ambulation   Ambulation assist      Assist level: Minimal Assistance - Patient > 75% Assistive device: Other (comment)(none) Max distance: 100'   Walk 10 feet activity   Assist  Walk 10 feet activity did not occur: Safety/medical concerns  Assist level: Minimal Assistance - Patient > 75% Assistive device: Walker-rolling   Walk 50 feet activity   Assist Walk 50 feet with 2 turns activity did not occur: Safety/medical concerns  Assist level: Minimal Assistance - Patient > 75% Assistive device: Walker-rolling    Walk 150 feet activity   Assist Walk 150 feet activity did not occur: Safety/medical concerns  Assist level: Contact Guard/Touching assist Assistive device: Walker-rolling    Walk 10 feet on uneven surface   activity   Assist Walk 10 feet on uneven surfaces activity did not occur: Safety/medical concerns         Wheelchair     Assist Will patient use wheelchair at discharge?: No   Wheelchair activity did not occur: N/A         Wheelchair 50 feet with 2 turns activity    Assist    Wheelchair 50 feet with 2 turns activity did not occur: N/A       Wheelchair 150 feet activity     Assist Wheelchair 150 feet activity did not occur: N/A        Medical Problem List and Plan: 1.Decreased functional mobility with headache dizzinesssecondary to 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 Antithrombotics: -DVT/anticoagulation:Subcutaneous Lovenox. Monitor for bleeding episodes -antiplatelet therapy: none 3. Pain Management:c/o of right temporal headache (which was BEFORE the seizures). Could be some form of vascular/migraine  -  -increase topamax to 25mg  BIE 4. Mood:provide emotional support  -antipsychotic agents: none 5. Neuropsych: This patientiscapable of making decisions on herown behalf. 6. Skin/Wound Care:Routine skin checks 7. Fluids/Electrolytes/Nutrition:   -encourage PO intake   -I personally reviewed the patient's labs today.    -dietary consult  -hyponatremia 134 3/19 8. Seizures/status epilepticus potentially due to hyperglycemia:  -appreciate neuro follow up, they have signed off  - keppra reduced to 750mg  bid   -reduced Lyrica 100 mg 3 times a day  -I see improvement with reduced dosing already  -Pt off phenobarbital and dilantin -no further seizure activity   9.Diabetes mellitus with peripheral neuropathy. Hemoglobin A1c 14.9.     -improved control.    -continue amaryl 2mg  daily  -due to improvement with amaryl, I held lantus  -CM diet, dietary ed 10. Dysphagia. Diet advanced to regular. 11. Asthma with remote history of tobacco abuse. Continue nebulizers 12. Morbid obesity. BMI 43.44. Dietary follow-up 13.CKD stage III.  BUN/Cr continues to drift up  -encourage fluids  -ordered labs for Friday    LOS: 6 days A FACE TO FACE EVALUATION WAS PERFORMED  Meredith Staggers 05/20/2018, 12:15 PM

## 2018-05-20 NOTE — Progress Notes (Addendum)
Inpatient Diabetes Program Recommendations  AACE/ADA: New Consensus Statement on Inpatient Glycemic Control  Target Ranges:  Prepandial:   less than 140 mg/dL      Peak postprandial:   less than 180 mg/dL (1-2 hours)      Critically ill patients:  140 - 180 mg/dL  Results for CADI, RHINEHART (MRN [id number]) as of 05/20/2018 08:00  Ref. Range 05/19/2018 05:57 05/19/2018 11:29 05/19/2018 16:55 05/20/2018 06:25  Glucose-Capillary Latest Ref Range: 70 - 99 mg/dL 116 (H) 91 78 119 (H)   Results for PATTIANN, SOLANKI (MRN [id number]) as of 05/20/2018 08:00  Ref. Range 04/16/2017 15:01 05/06/2018 13:00  Hemoglobin A1C Latest Ref Range: 4.8 - 5.6 % 6.2 (H) 14.9 (H)   Review of Glycemic Control  Diabetes history: DM2 Outpatient Diabetes medications: Amaryl 2 mg daily Current orders for Inpatient glycemic control: Novolog 0-9 units TID with meals, Amaryl 2 mg QAM   Inpatient Diabetes Program Recommendations:   HgbA1C: A1C 14.9% on 05/06/18. Patient was seen by Inpatient Diabetes Coordinator several times during hospital admission (seen last on 05/17/18).  A1C of 14.9% on 05/06/18 indicating an average glucose of 381 mg/dl.  A1C does not correlate with noted glucose trends over the past several days. Would recommend patient just be discharged on Amaryl 2 mg daily and follow up with PCP regarding DM control.  Thanks, Barnie Alderman, RN, MSN, CDE Diabetes Coordinator Inpatient Diabetes Program [phone] (Team Pager from 8am to 5pm)

## 2018-05-20 NOTE — Progress Notes (Signed)
Speech Language Pathology Daily Session Note  Patient Details  Name: Rebecca Johnston MRN: [id number] Date of Birth: April 12, 1970  Today's Date: 05/20/2018 SLP Individual Time: 1315-1345 SLP Individual Time Calculation (min): 30 min  Short Term Goals: Week 1: SLP Short Term Goal 1 (Week 1): Pt will utilize external memory aids to recall information in 8 out of 10 opportunities with Min A cues.  SLP Short Term Goal 2 (Week 1): Pt will complete semi-complex problem solving tasks with Mod A cues.  SLP Short Term Goal 3 (Week 1): Pt will demonstrate selective attention to task for ~ 15 minutes in mildly distracting environment with Mod A cues.  SLP Short Term Goal 4 (Week 1): Pt will demonstrate intellectual awareness by stating 3 physical and cognitive deficits that are related to acute condition in 8 out of 10 opportunities with Min A.  SLP Short Term Goal 5 (Week 1): Pt will utilize speech intelligibility strategies to increase speech intelligiiblity at the sentence level to ~ 90% with Min A cues.  SLP Short Term Goal 6 (Week 1): Pt will consume least restrictive diet with Mod I cues and minimal overt s/s of aspiration/dysphagia.   Skilled Therapeutic Interventions:  Pt was seen for skilled ST targeting cognitive goals.  Session began late as pt had an episode of low blood sugar and lunch had just arrived.  Pt requesting to eat lunch prior to therapy in an attempt to elevate her blood sugar.   SLP facilitated the session with a novel card game targeting use of memory compensatory strategies, specifically word-picture associations.  Pt was able to complete task for 100% accuracy with supervision faded to mod I . SLP also reviewed memory compensatory strategies and provided pt with a handout to maximize carryover in between therapy sessions.  Pt was left in recliner with all needs within reach.    Pain Pain Assessment Pain Scale: 0-10 Pain Score: 10-Worst pain ever Pain Type: Acute pain Pain  Location: Head Pain Descriptors / Indicators: Headache Pain Frequency: Constant Pain Intervention(s): Other (Comment)(premedicated )  Therapy/Group: Individual Therapy  Siani Utke, Selinda Orion 05/20/2018, 3:57 PM

## 2018-05-20 NOTE — Plan of Care (Signed)
  Problem: RH PAIN MANAGEMENT Goal: RH STG PAIN MANAGED AT OR BELOW PT'S PAIN GOAL Description <3 out of 10.   Outcome: Not Progressing; constant headache PA aware

## 2018-05-20 NOTE — Progress Notes (Addendum)
Hypoglycemic Event  CBG: 58 Treatment: lunch  Symptoms: none  Follow-up CBG: Time:1410 CBG Result:111  Possible Reasons for Event: meds  Comments/MD notified:protocol followed; Dan PA    Jillyn Ledger

## 2018-05-20 NOTE — Progress Notes (Signed)
Physical Therapy Session Note  Patient Details  Name: Rebecca Johnston MRN: [id number] Date of Birth: 1970-08-08  Today's Date: 05/20/2018 PT Individual Time: 0930-1030 PT Individual Time Calculation (min): 60 min   Short Term Goals: Week 1:  PT Short Term Goal 1 (Week 1): Pt will ambulate 31' w/ LRAD and CGA PT Short Term Goal 2 (Week 1): Pt will maintain dynamic standing balance w/ min assist PT Short Term Goal 3 (Week 1): Pt will demonstrate appropriate safety awareness w/o cues 50% of the time PT Short Term Goal 4 (Week 1): Pt will participate in 60 min of upright activity w/o increase in fatigue  Skilled Therapeutic Interventions/Progress Updates:    Pt received standing at sink completing ADLs with Supervision from NT, agreeable to PT session. Pt reports ongoing headache, provided essential oil blend of lavender and peppermint for headache management. Pt appreciative and reports decrease in pain with use of essential oils. Manual w/c propulsion x 150 ft with use of B UE/LE for global endurance training. Standing L/R colored target taps with min HHA for balance focusing on BLE coordination and standing balance. Seated core strengthening exercises with 4# weighted ball: punch-outs, diagonals. Sit to stand x 10 reps with no UE support and min A for BLE strengthening and eccentric control when sitting. Ambulation 3 x 100' with no AD and min A with focus on core engagement for upright posture and adding in arm swing for improved reciprocal gait pattern. Pt exhibits improved balance from yesterday with some ongoing ataxia and path deviation. Pt left seated in recliner in room with needs in reach, quick release belt and chair alarm in place at end of session.  Therapy Documentation Precautions:  Precautions Precautions: Fall Restrictions Weight Bearing Restrictions: No    Therapy/Group: Individual Therapy   Excell Seltzer, PT, DPT  05/20/2018, 11:10 AM

## 2018-05-20 NOTE — Progress Notes (Addendum)
Occupational Therapy Session Note  Patient Details  Name: Rebecca Johnston MRN: [id number] Date of Birth: 05-14-1970  Today's Date: 05/20/2018 OT Individual Time: 6834-1962 and 1105-1130 OT Individual Time Calculation (min): 62 min and 35 min OT Missed Time: 13 min (pt fatigue)   Short Term Goals: Week 1:  OT Short Term Goal 1 (Week 1): Pt will complete 3 grooming tasks while standing at sink to improve standing endurance  OT Short Term Goal 2 (Week 1): Pt will complete 3/3 toileting tasks with steady assist and min vcs for DME mgt  Skilled Therapeutic Interventions/Progress Updates:    Session One: Pt seen for OT ADL bathing/dressing session. Pt awake in supine upon arrival, complaints of headache, RN and MD made aware of continuous headaches. She transferred to EOB mod I using hospital bed functions. She ambulated throughout room with guarding assist, min A when turning and x2 LOB episodes requiring min-mod A to regain, pt with decreased awareness of balance deficits. She remains lethargic and slowed with all movements and speech. She gathered clothing items from closet and drawers in prep for bathing task. She bathed seated on tub transfer bench with supervision, standing without need for grab bar to complete pericare/buttock hygiene. Required use of grab bar and guarding assist when standing to dry off 2/2 fatigue.  She returned to standard chair to dress, independent UB dressing, close supervision when standing with RW to pull pants up. She donned TED hose and shoes with set-up assist.  She ambulated into bathroom and picked up dirty towels from floor, min-mod steadying assist and education for RW use.  Pt with increased fatigue and cont with complaints of headache, declined standing to completing grooming tasks due to fatigue, opting to complete from seated level with set-up.  Pt cont with increased lethargy and complaints of headache, requesting to terminate tx session. She returned to  recliner, will attempt to make-up time later in day as pt able to participate.  Pt left seated in recliner at end of session, chair belt alarm on and all needs in reach.  Throughout session, she required VCs for RW management in functional context, specifically when squaring up to chair to preparing to sit.   Session Two: Pt seen for OT session focusing on functional ambulation and dynamic standing balance and endurance. Pt sitting upright in recliner upon arrival, cont with complaints of headache, declined intervention and willing to participate as able.  She ambulated throughout unit with RW and close supervision, cont to require min A with sharp turns due to ataxia and weightshift, however, able to self correct. Trialed functional ambulation throughout unit without AD. Pt reaching out for environmental items to assist with balance and requiring increased assist when fatigued. Discussed with PT and recommending use of RW at this time for functional use and when more fatigued.  Completed dynamic standing task utilizing [employer] addressing dynamic standing balance and endurance without UE support. Pt reaching into all planes to hit target and maintaining balance with close supervision. Completed x2 trials. Following seated rest break, pt ambulated back to room with CGA, one episode of scissoring requiring increased assist to regain balance and attempts to self-correct. Pt left seated in recliner at end of session, chair belt alarm on and all needs in reach.    Therapy Documentation Precautions:  Precautions Precautions: Fall Restrictions Weight Bearing Restrictions: No   Therapy/Group: Individual Therapy  Christipher Rieger L 05/20/2018, 7:09 AM

## 2018-05-21 ENCOUNTER — Inpatient Hospital Stay (HOSPITAL_COMMUNITY): Payer: Medicaid Other | Admitting: Speech Pathology

## 2018-05-21 ENCOUNTER — Inpatient Hospital Stay (HOSPITAL_COMMUNITY): Payer: Medicaid Other | Admitting: Physical Therapy

## 2018-05-21 ENCOUNTER — Inpatient Hospital Stay (HOSPITAL_COMMUNITY): Payer: Medicaid Other | Admitting: Occupational Therapy

## 2018-05-21 LAB — GLUCOSE, CAPILLARY
GLUCOSE-CAPILLARY: 66 mg/dL — AB (ref 70–99)
Glucose-Capillary: 64 mg/dL — ABNORMAL LOW (ref 70–99)
Glucose-Capillary: 153 mg/dL — ABNORMAL HIGH (ref 70–99)
Glucose-Capillary: 136 mg/dL — ABNORMAL HIGH (ref 70–99)
Glucose-Capillary: 80 mg/dL (ref 70–99)
Glucose-Capillary: 143 mg/dL — ABNORMAL HIGH (ref 70–99)

## 2018-05-21 LAB — BASIC METABOLIC PANEL
Anion gap: 8 (ref 5–15)
BUN: 19 mg/dL (ref 6–20)
CO2: 21 mmol/L — AB (ref 22–32)
Calcium: 9.1 mg/dL (ref 8.9–10.3)
Chloride: 106 mmol/L (ref 98–111)
Creatinine, Ser: 1.68 mg/dL — ABNORMAL HIGH (ref 0.44–1.00)
GFR calc Af Amer: 41 mL/min — ABNORMAL LOW (ref >60)
GFR calc non Af Amer: 36 mL/min — ABNORMAL LOW (ref >60)
Glucose, Bld: 127 mg/dL — ABNORMAL HIGH (ref 70–99)
Potassium: 4.3 mmol/L (ref 3.5–5.1)
Sodium: 135 mmol/L (ref 135–145)

## 2018-05-21 MED ORDER — BUTALBITAL-APAP-CAFFEINE 50-325-40 MG PO TABS
1.0000 | ORAL_TABLET | Freq: Four times a day (QID) | ORAL | Status: DC | PRN
  Administered 2018-05-21 – 2018-05-25 (×9): 1 via ORAL
  Filled 2018-05-21 (×9): qty 1

## 2018-05-21 NOTE — Progress Notes (Signed)
Occupational Therapy Weekly Progress Note  Patient Details  Name: Rebecca Johnston MRN: [id number] Date of Birth: 1971-02-03  Beginning of progress report period: May 15, 2018 End of progress report period: May 21, 2018  Today's Date: 05/21/2018 OT Individual Time: 4854-6270 OT Individual Time Calculation (min): 75 min    Patient has met 2 of 2 short term goals.  Pt is making steady progress towards OT goals. She cont to be most limited by chronic headaches and generalized fatigue/lethargy. She is ambulating with close supervision- occasional min A using RW, occasional LOBs when turning during functional ambulation/ tasks. Pt with improving safety awareness. She reports vision has returned to baseline level, however, demonstrates some difficulty managiing depth perception and cisual acuity to navigate around environmental obstacles. She is completing basic ADLs at supervision level, sitting on tub transfer bench to bathe and completing dressing tasks from seated position.   Patient continues to demonstrate the following deficits: muscle weakness, decreased cardiorespiratoy endurance, blurry vision, decreased attention, decreased awareness, decreased problem solving and decreased safety awareness and decreased standing balance, decreased postural control and decreased balance strategies. and therefore will continue to benefit from skilled OT intervention to enhance overall performance with BADL and Reduce care partner burden.  Patient progressing toward long term goals..  Continue plan of care.  OT Short Term Goals Week 1:  OT Short Term Goal 1 (Week 1): Pt will complete 3 grooming tasks while standing at sink to improve standing endurance  OT Short Term Goal 1 - Progress (Week 1): Met OT Short Term Goal 2 (Week 1): Pt will complete 3/3 toileting tasks with steady assist and min vcs for DME mgt OT Short Term Goal 2 - Progress (Week 1): Met Week 2:  OT Short Term Goal 1 (Week 2): STG=LTG due  to LOS  Skilled Therapeutic Interventions/Progress Updates:    Pt seen for OT ADL bathing/dressing session. Pt in supine upon arrival, awake and agreeable to tx session. Pt voicing headache pain 10/10, initially declining to alert RN, however pt requested pain medicine when RN administered AM meds at start of session and pain medicine adminostered at start of session. Pt ambulated throughout session using RW with supervision, VCs for RW management in functional context and for safety awareness during ADL tasks as pt attempting to don/doff clothing from standing position. She bathed seated on tub transfer bench with supervision, standing to complete pericare/buttock hygiene. She dressed seated on BSC over toilet, set-up assist UB dressing and supervision LB dressing.  She donned TED hose and shoes with set-up.She declined standing at sink to complete grooming tasks 2/2 fatigue, completed from w/c level mod I. She required increased time and rest breaks throughout session 2/2 pain and generalized decreased functional activity tolerance.  Pt requesting to go outside this session. Taken off unit total A in w/c for time and energy conservation. Ambulated x2 trials outside, each ~44f. First trial supervision using RW and following seated rest break completed second trial with HHA. Discussed pros vs cons of RW vs no AD. At this time recommending RW for increased safety and balance during ADL/IADL tasks, pt voiced understanding. Pt returned to unit at end of session, transferred to recliner with supervision. Pt left seated in recliner at end of session, chair belt alarm on and all needs in reach.  Provided with ice for headache. Throughout session, education completed regarding activity progression, importance of proper diet and exercise for diabetes management (within OT scope of practice), energy conservation and d/c planning.  Pt remains very motivated to return to PLOF including return to work.   Therapy  Documentation Precautions:  Precautions Precautions: Fall Restrictions Weight Bearing Restrictions: No   Therapy/Group: Individual Therapy  Teighan Aubert L 05/21/2018, 6:49 AM

## 2018-05-21 NOTE — Progress Notes (Signed)
[place] PHYSICAL MEDICINE & REHABILITATION PROGRESS NOTE   Subjective/Complaints: Pt voiced concerns about her sugars being too low. Also reports ongoing headache but did not discuss until I cued her on it today  ROS: Patient denies fever, rash, sore throat, blurred vision, nausea, vomiting, diarrhea, cough, shortness of breath or chest pain, joint or back pain, headache, or mood change.     Objective:   No results found. No results for input(s): WBC, HGB, HCT, PLT in the last 72 hours. Recent Labs    05/20/18 0535 05/21/18 0513  NA 134* 135  K 4.5 4.3  CL 103 106  CO2 21* 21*  GLUCOSE 124* 127*  BUN 19 19  CREATININE 1.74* 1.68*  CALCIUM 9.2 9.1    Intake/Output Summary (Last 24 hours) at 05/21/2018 0914 Last data filed at 05/21/2018 0730 Gross per 24 hour  Intake 600 ml  Output -  Net 600 ml     Physical Exam: Vital Signs Blood pressure 104/67, pulse 86, temperature 97.9 F (36.6 C), temperature source Oral, resp. rate 14, height 5\' 5"  (1.651 m), weight 119.2 kg, last menstrual period 11/26/2014, SpO2 99 %. Constitutional: No distress . Vital signs reviewed. HEENT: EOMI, oral membranes moist Neck: supple Cardiovascular: RRR without murmur. No JVD    Respiratory: CTA Bilaterally without wheezes or rales. Normal effort    GI: BS +, non-tender, non-distended  Musculoskeletal:Normal range of motion. no focal tenderness General: Edema(BLE)present. No tendernessor deformity.  Neurological: Alert. Speech less dysarthric. More on point and seems to be initiating better and displaying better insight .    Moves UE 4/5 prox to distal. LE: 4/5 prox to 4/5 distally. Sensed pain in all 4's. CN non-focal    Skin: Skin isdry. She isnot diaphoretic.  Psychiatric:more dynamic    Assessment/Plan: 1. Functional deficits secondary to status epilepticus/encephalopathy which require 3+ hours per day of interdisciplinary therapy in a comprehensive inpatient rehab  setting.  Physiatrist is providing close team supervision and 24 hour management of active medical problems listed below.  Physiatrist and rehab team continue to assess barriers to discharge/monitor patient progress toward functional and medical goals  Care Tool:  Bathing    Body parts bathed by patient: Right arm, Left arm, Chest, Abdomen, Front perineal area, Buttocks, Right upper leg, Left upper leg, Right lower leg, Left lower leg, Face         Bathing assist Assist Level: Supervision/Verbal cueing     Upper Body Dressing/Undressing Upper body dressing   What is the patient wearing?: Bra, Pull over shirt    Upper body assist Assist Level: Independent    Lower Body Dressing/Undressing Lower body dressing      What is the patient wearing?: Underwear/pull up, Pants     Lower body assist Assist for lower body dressing: Supervision/Verbal cueing     Toileting Toileting Toileting Activity did not occur (Clothing management and hygiene only): N/A (no void or bm)  Toileting assist Assist for toileting: Supervision/Verbal cueing     Transfers Chair/bed transfer  Transfers assist     Chair/bed transfer assist level: Supervision/Verbal cueing     Locomotion Ambulation   Ambulation assist      Assist level: Minimal Assistance - Patient > 75% Assistive device: Other (comment)(none) Max distance: 100'   Walk 10 feet activity   Assist  Walk 10 feet activity did not occur: Safety/medical concerns  Assist level: Minimal Assistance - Patient > 75% Assistive device: Walker-rolling   Walk 50 feet activity  Assist Walk 50 feet with 2 turns activity did not occur: Safety/medical concerns  Assist level: Minimal Assistance - Patient > 75% Assistive device: Walker-rolling    Walk 150 feet activity   Assist Walk 150 feet activity did not occur: Safety/medical concerns  Assist level: Contact Guard/Touching assist Assistive device: Walker-rolling     Walk 10 feet on uneven surface  activity   Assist Walk 10 feet on uneven surfaces activity did not occur: Safety/medical concerns         Wheelchair     Assist Will patient use wheelchair at discharge?: No   Wheelchair activity did not occur: N/A         Wheelchair 50 feet with 2 turns activity    Assist    Wheelchair 50 feet with 2 turns activity did not occur: N/A       Wheelchair 150 feet activity     Assist Wheelchair 150 feet activity did not occur: N/A        Medical Problem List and Plan: 1.Decreased functional mobility with headache dizzinesssecondary to 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 Antithrombotics: -DVT/anticoagulation:Subcutaneous Lovenox. Monitor for bleeding episodes -antiplatelet therapy: none 3. Pain Management:c/o of right temporal headache (which was BEFORE the seizures). Could be some form of vascular/migraine    -increased topamax to 25mg  BID on 3/19---consider increase to 50mg   Bid this weekend    -add fioricet for breakthrough headache 4. Mood:provide emotional support  -antipsychotic agents: none 5. Neuropsych: This patientiscapable of making decisions on herown behalf. 6. Skin/Wound Care:Routine skin checks 7. Fluids/Electrolytes/Nutrition:   -encourage PO intake   -I personally reviewed the patient's labs today.    -dietary consult  -hyponatremia --134 3/19 8. Seizures/status epilepticus potentially due to hyperglycemia:  -appreciate neuro follow up, they have signed off  - keppra reduced to 750mg  bid   -reduced Lyrica 100 mg 3 times a day  -I see improvement with reduced dosing already  -Pt off phenobarbital and  dilantin -no further seizure activity   9.Diabetes mellitus with peripheral neuropathy. Hemoglobin A1c 14.9.    -pt with hypoglycemia at times now  -I discussed with patient today. She admitted to eating better and more regularly than she did prior to hospitalization  -will hold amaryl and observe  -continue CM diet, dietary ed 10. Dysphagia. Diet advanced to regular. 11. Asthma with remote history of tobacco abuse. Continue nebulizers 12. Morbid obesity. BMI 43.44. Dietary follow-up 13.CKD stage III.  BUN/Cr stabilized today at 19/1.68  -continue to encourage fluids  -follow up lab work next week   LOS: 7 days A FACE TO FACE EVALUATION WAS PERFORMED  Meredith Staggers 05/21/2018, 9:14 AM

## 2018-05-21 NOTE — Progress Notes (Signed)
Physical Therapy Weekly Progress Note  Patient Details  Name: ASMA BOLDON MRN: [id number] Date of Birth: 1970/05/13  Beginning of progress report period: May 15, 2018 End of progress report period: May 21, 2018  Today's Date: 05/21/2018 PT Individual Time: 1015-1130 PT Individual Time Calculation (min): 75 min   Patient has met 4 of 4 short term goals.  Pt is able to transfer with Supervision, ambulate up to 200 ft with RW and Supervision to [place], is able to perform stairs with CGA, and is overall exhibits improved endurance and tolerance for therapy. Patient is making good progress and is on target to d/c home next week at Supervision level.  Patient continues to demonstrate the following deficits muscle weakness and decreased standing balance, decreased postural control and decreased balance strategies and therefore will continue to benefit from skilled PT intervention to increase functional independence with mobility.  Patient progressing toward long term goals..  Continue plan of care.  PT Short Term Goals Week 1:  PT Short Term Goal 1 (Week 1): Pt will ambulate 68' w/ LRAD and CGA PT Short Term Goal 1 - Progress (Week 1): Met PT Short Term Goal 2 (Week 1): Pt will maintain dynamic standing balance w/ min assist PT Short Term Goal 2 - Progress (Week 1): Met PT Short Term Goal 3 (Week 1): Pt will demonstrate appropriate safety awareness w/o cues 50% of the time PT Short Term Goal 3 - Progress (Week 1): Met PT Short Term Goal 4 (Week 1): Pt will participate in 60 min of upright activity w/o increase in fatigue PT Short Term Goal 4 - Progress (Week 1): Met Week 2:  PT Short Term Goal 1 (Week 2): =LTG due to ELOS  Skilled Therapeutic Interventions/Progress Updates:    Pt received seated in recliner in room, agreeable to PT session. Pt reports ongoing headache, rated 6/10, using ice pack intermittently for pain relief. Pt more lethargic this AM 2/2 medications. Seated core  strengthening exercises with 4# weighted exercise ball: punch-outs, L/R rotations. Ambulation x 150 ft with RW and close SBA to CGA. Standing balance playing Wii balance penguin game: focus on lateral weight shift following visual cues from game and with mod multimodal cueing for weight shift. Pt unable to follow cues to utilize ankle or hip strategy to maintain balance and just shifts her upper body direction she wants to shift. Pt scores 34 then 30 points on penguin game. Ambulation with RW and CGA navigating through cones to improve RW navigation through tight spaces. Sidesteps L/R 2 x 30 ft with BUE support and Supervision for balance and hip abd strengthening. Standing alt L/R cone taps with one UE support and CGA for BLE coordination training. Pt appears very fatigued at end of session and reports she feels that her blood sugar is low. NT notified and will check patient's blood sugar. Pt left seated in chair in room with needs in reach, quick release belt and chair alarm in place.  Therapy Documentation Precautions:  Precautions Precautions: Fall Restrictions Weight Bearing Restrictions: No   Therapy/Group: Individual Therapy   Excell Seltzer, PT, DPT 05/21/2018, 10:45 AM

## 2018-05-21 NOTE — Progress Notes (Signed)
Speech Language Pathology Weekly Progress and Session Note  Patient Details  Name: Rebecca Johnston MRN: [id number] Date of Birth: April 02, 1970  Beginning of progress report period: May 14, 2018 End of progress report period: May 21, 2018   Today's Date: 05/21/2018 SLP Individual Time: 1335-1415 SLP Individual Time Calculation (min): 40 min  Short Term Goals: Week 1: SLP Short Term Goal 1 (Week 1): Pt will utilize external memory aids to recall information in 8 out of 10 opportunities with Min A cues.  SLP Short Term Goal 1 - Progress (Week 1): Met SLP Short Term Goal 2 (Week 1): Pt will complete semi-complex problem solving tasks with Mod A cues.  SLP Short Term Goal 2 - Progress (Week 1): Met SLP Short Term Goal 3 (Week 1): Pt will demonstrate selective attention to task for ~ 15 minutes in mildly distracting environment with Mod A cues.  SLP Short Term Goal 3 - Progress (Week 1): Met SLP Short Term Goal 4 (Week 1): Pt will demonstrate intellectual awareness by stating 3 physical and cognitive deficits that are related to acute condition in 8 out of 10 opportunities with Min A.  SLP Short Term Goal 4 - Progress (Week 1): Met SLP Short Term Goal 5 (Week 1): Pt will utilize speech intelligibility strategies to increase speech intelligiiblity at the sentence level to ~ 90% with Min A cues.  SLP Short Term Goal 5 - Progress (Week 1): Met SLP Short Term Goal 6 (Week 1): Pt will consume least restrictive diet with Mod I cues and minimal overt s/s of aspiration/dysphagia.  SLP Short Term Goal 6 - Progress (Week 1): Discontinued (comment)(pt informally observed eating during sessions, no s/s of dysphagia and no complaints of difficulty swallowing )    New Short Term Goals: Week 2: SLP Short Term Goal 1 (Week 2): STG=LTG due to ELOS   Weekly Progress Updates:   Pt has made functional gains this reporting period and has met 5 out of 5 targeted short term goals.  Pt is currently min-mod assist  for tasks due to mild higher level cognitive deficits and speech production impairments (dysarthria versus imprecise articulation in the setting of medication effects/lethargy).  Pt has demonstrated improved recall of daily information, mildly complex problem solving, and attention to tasks.   Pt education is ongoing; however, no family has been present for training at this time.  Pt would continue to benefit from skilled ST while inpatient in order to maximize functional independence and reduce burden of care prior to discharge.  Anticipate that pt will need 24/7 supervision at discharge in addition to [place] follow up at next level of care.        Intensity: Minumum of 1-2 x/day, 30 to 90 minutes Frequency: 3 to 5 out of 7 days Duration/Length of Stay: 10 to 12 days Treatment/Interventions: Cognitive remediation/compensation;Functional tasks;Patient/family education;Therapeutic Activities;Medication managment;Internal/external aids;Speech/Language facilitation   Daily Session  Skilled Therapeutic Interventions: SLP facilitated the session with a novel deductive reasoning puzzle to address problem solving goals.  Pt needed overall mod assist to complete task due to decreased task organization in the setting of lethargy and slowed processing.  Pt reports not sleeping well last night and ongoing concerns of 10/10 headache.  Pt was left in bed with bed alarm set and call bell within reach.  RN made aware of headache.  Goals updated on this date to reflect current progress and plan of care.       General    Pain  Pain Assessment Pain Scale: 0-10 Pain Score: 10-Worst pain ever Pain Type: (P) Acute pain Pain Location: Head Pain Descriptors / Indicators: Headache Pain Intervention(s): RN made aware;Repositioned;Cold applied  Therapy/Group: Individual Therapy  Jamirra Curnow, Selinda Orion 05/21/2018, 3:37 PM

## 2018-05-21 NOTE — Plan of Care (Signed)
  Problem: Consults Goal: RH GENERAL PATIENT EDUCATION Description See Patient Education module for education specifics. Outcome: Progressing   Problem: RH BOWEL ELIMINATION Goal: RH STG MANAGE BOWEL WITH ASSISTANCE Description STG Manage Bowel with Min Assistance.  Outcome: Progressing Flowsheets (Taken 05/21/2018 1359) STG: Pt will manage bowels with assistance: 4-Minimum assistance   Problem: RH SKIN INTEGRITY Goal: RH STG SKIN FREE OF INFECTION/BREAKDOWN Description Pt will remain free of skin breakdown and infection with min assist   Outcome: Progressing Goal: RH STG MAINTAIN SKIN INTEGRITY WITH ASSISTANCE Description STG Maintain Skin Integrity With [place].  Outcome: Progressing Flowsheets (Taken 05/21/2018 1359) STG: Maintain skin integrity with assistance: 4-Minimal assistance   Problem: RH PAIN MANAGEMENT Goal: RH STG PAIN MANAGED AT OR BELOW PT'S PAIN GOAL Description <3 out of 10.   Outcome: Progressing

## 2018-05-21 NOTE — Significant Event (Signed)
Hypoglycemic Event  CBG: 64 0630  Treatment: 4 oz juice/soda  Symptoms: None  Follow-up CBG: HQNE:[id number] CBG Result:153  Possible Reasons for Event: Unknown  Comments/MD notified: Daleen Squibb MD notified    Kathreen Cosier

## 2018-05-21 NOTE — Significant Event (Signed)
Low blood sugar for lunch ,PA informed .

## 2018-05-21 NOTE — Progress Notes (Signed)
Inpatient Diabetes Program Recommendations  AACE/ADA: New Consensus Statement on Inpatient Glycemic Control  Target Ranges:  Prepandial:   less than 140 mg/dL      Peak postprandial:   less than 180 mg/dL (1-2 hours)      Critically ill patients:  140 - 180 mg/dL   Results for Rebecca Johnston, Rebecca Johnston (MRN [id number]) as of 05/21/2018 10:09  Ref. Range 05/20/2018 06:25 05/20/2018 13:01 05/20/2018 14:11 05/20/2018 17:10 05/21/2018 06:23 05/21/2018 06:49  Glucose-Capillary Latest Ref Range: 70 - 99 mg/dL 119 (H) 58 (L) 111 (H) 134 (H) 64 (L) 153 (H)   Review of Glycemic Control  Diabetes history: DM2 Outpatient Diabetes medications: Amaryl 2 mg daily Current orders for Inpatient glycemic control: Novolog 0-9 units TID with meals  Inpatient Diabetes Program Recommendations:   Oral Agents: Noted hypoglycemia on 05/20/18 and today. Noted Amaryl 2 mg already given this morning and that Amaryl has now been discontinued. If glucose becomes consistently greater than 150 mg/dl, would recommend adding back Amaryl but at lower dose 1 mg daily.  HgbA1C: A1C 14.9% on 05/06/18. Patient was seen by Inpatient Diabetes Coordinator several times during hospital admission (seen last on 05/17/18).  A1C of 14.9% on 05/06/18 indicating an average glucose of 381 mg/dl.  A1C does not correlate with noted glucose trends over the past several days. May want to consider rechecking A1C.   Thanks, Barnie Alderman, RN, MSN, CDE Diabetes Coordinator Inpatient Diabetes Program [phone] (Team Pager from 8am to 5pm)

## 2018-05-22 LAB — GLUCOSE, CAPILLARY
Glucose-Capillary: 119 mg/dL — ABNORMAL HIGH (ref 70–99)
Glucose-Capillary: 92 mg/dL (ref 70–99)
Glucose-Capillary: 80 mg/dL (ref 70–99)
Glucose-Capillary: 144 mg/dL — ABNORMAL HIGH (ref 70–99)

## 2018-05-22 NOTE — Progress Notes (Signed)
Rebecca Johnston is a 48 y.o. female who is admitted for CIR with functional deficits secondary to refractory status epilepticus  Past Medical History:  Diagnosis Date  . Anemia   . Asthma   . Diabetes mellitus without complication ([place]) 02/7516  . Fibroids   . GERD (gastroesophageal reflux disease)   . History of blood transfusion Feb 2015  . Obesity, Class III, BMI 40-49.9 (morbid obesity) ([place])   . Shortness of breath dyspnea      Subjective: No new complaints. No new problems.  Continues to have headaches now present for 3 weeks.  Some constipation over the past 2 days  Objective: Vital signs in last 24 hours: Temp:  [98 F (36.7 C)-98.1 F (36.7 C)] 98.1 F (36.7 C) (03/20 1955) Pulse Rate:  [79-87] 87 (03/21 0624) Resp:  [18-20] 18 (03/21 0624) BP: (96-104)/(62-80) 104/67 (03/21 0624) SpO2:  [99 %-100 %] 99 % (03/21 0624) Weight:  [119.7 kg] 119.7 kg (03/21 0624) Weight change: 0.5 kg Last BM Date: 05/22/18  Intake/Output from previous day: 03/20 0701 - 03/21 0700 In: 51 [P.O.:820] Out: -  Last cbgs: CBG (last 3)  Recent Labs    05/21/18 1712 05/21/18 2117 05/22/18 0627  GLUCAP 80 143* 119*   Patient Vitals for the past 24 hrs:  BP Temp Temp src Pulse Resp SpO2 Weight  05/22/18 0624 104/67 - - 87 18 99 % 119.7 kg  05/21/18 1955 103/80 98.1 F (36.7 C) Oral 79 20 100 % -  05/21/18 1430 96/62 98 F (36.7 C) Oral 82 20 100 % -     Physical Exam General: No apparent distress obese HEENT: not dry Lungs: Normal effort. Lungs clear to auscultation, no crackles or wheezes. Cardiovascular: Regular rate and rhythm, no edema Abdomen: S/NT/ND; BS(+) Musculoskeletal:  unchanged Neurological: No new neurological deficits with some generalized weakness Extremities.  Trace edema Skin: clear no rash Mental state: Alert, oriented, cooperative    Lab Results: BMET    Component Value Date/Time   NA 135 05/21/2018 0513   NA 139 04/16/2017 1501   K 4.3  05/21/2018 0513   CL 106 05/21/2018 0513   CO2 21 (L) 05/21/2018 0513   GLUCOSE 127 (H) 05/21/2018 0513   GLUCOSE 79 05/06/2013 1240   BUN 19 05/21/2018 0513   BUN 20 04/16/2017 1501   CREATININE 1.68 (H) 05/21/2018 0513   CREATININE 1.65 (H) 12/21/2015 1019   CALCIUM 9.1 05/21/2018 0513   GFRNONAA 36 (L) 05/21/2018 0513   GFRNONAA 37 (L) 12/21/2015 1019   GFRAA 41 (L) 05/21/2018 0513   GFRAA 43 (L) 12/21/2015 1019   CBC    Component Value Date/Time   WBC 7.9 05/17/2018 0640   RBC 4.28 05/17/2018 0640   HGB 9.4 (L) 05/17/2018 0640   HGB 12.6 04/16/2017 1501   HCT 31.4 (L) 05/17/2018 0640   HCT 38.5 04/16/2017 1501   PLT 267 05/17/2018 0640   PLT 352 04/16/2017 1501   MCV 73.4 (L) 05/17/2018 0640   MCV 67 (L) 04/16/2017 1501   MCH 22.0 (L) 05/17/2018 0640   MCHC 29.9 (L) 05/17/2018 0640   RDW 17.6 (H) 05/17/2018 0640   RDW 17.0 (H) 04/16/2017 1501   LYMPHSABS 1.4 05/17/2018 0640   MONOABS 0.7 05/17/2018 0640   EOSABS 0.2 05/17/2018 0640   BASOSABS 0.0 05/17/2018 0640     Medications: I have reviewed the patient's current medications.  Assessment/Plan:  Functional deficits following status epilepticus/encephalopathy.  Continue CIR DVT prophylaxis.  Continue Lovenox Pain management/chronic headaches.  Continue titration of Topamax Seizure disorder/history of status epilepticus.  Continue present AED therapy and neurology follow-up Diabetes mellitus.  Blood sugar well controlled Chronic kidney disease.  Continue to monitor   Length of stay, days: 8  Marletta Lor , MD 05/22/2018, 9:58 AM

## 2018-05-22 NOTE — Plan of Care (Signed)
  Problem: Consults Goal: RH GENERAL PATIENT EDUCATION Description See Patient Education module for education specifics. Outcome: Progressing   Problem: RH BOWEL ELIMINATION Goal: RH STG MANAGE BOWEL WITH ASSISTANCE Description STG Manage Bowel with Min Assistance.  Outcome: Progressing   Problem: RH SKIN INTEGRITY Goal: RH STG SKIN FREE OF INFECTION/BREAKDOWN Description Pt will remain free of skin breakdown and infection with min assist   Outcome: Progressing Goal: RH STG MAINTAIN SKIN INTEGRITY WITH ASSISTANCE Description STG Maintain Skin Integrity With [place].  Outcome: Progressing   Problem: RH PAIN MANAGEMENT Goal: RH STG PAIN MANAGED AT OR BELOW PT'S PAIN GOAL Description <3 out of 10.   Outcome: Progressing

## 2018-05-23 ENCOUNTER — Inpatient Hospital Stay (HOSPITAL_COMMUNITY): Payer: Medicaid Other | Admitting: Occupational Therapy

## 2018-05-23 LAB — GLUCOSE, CAPILLARY
GLUCOSE-CAPILLARY: 101 mg/dL — AB (ref 70–99)
Glucose-Capillary: 111 mg/dL — ABNORMAL HIGH (ref 70–99)
Glucose-Capillary: 114 mg/dL — ABNORMAL HIGH (ref 70–99)

## 2018-05-23 NOTE — Progress Notes (Signed)
Occupational Therapy Session Note  Patient Details  Name: Rebecca Johnston MRN: [id number] Date of Birth: 1970-03-05  Today's Date: 05/23/2018 OT Individual Time: 2633-3545 OT Individual Time Calculation (min): 56 min    Short Term Goals: Week 2:  OT Short Term Goal 1 (Week 2): STG=LTG due to LOS  Skilled Therapeutic Interventions/Progress Updates:    Therapist arrived at 10:00 for scheduled OT session. Pt reports eating breakfast and requesting therapist to return at later time.  Therapist returned at 1106, pt up in w/c ready for therapy. She denied need for bathing/dressing routine this AM as well as denying pain. She ambulated throughout session with RW and supervision. She ambulated to ADL apartment. Completed functional transfers from low soft surface couch with supervision. Completed simulated tub/shower transfers. Initially recommending and utilizing tub transfer bench, however, pt voicing she does not feel she will need it long term and therefore does not want to purchase DME. Trialed pt stepping over tub wall, completed with close supervision following demonstration for technique. Cont to recommend shower chair or BSC inside shower due to pt's decreased functional standing balance and endurance. Provided this education to pt who cont to believe she does not need shower DME, however with cont education pt becoming more agreeable. She is unsure if [facility] will fit in shower and therefore may need shower chair. Will cont to problem solve and educate pt.  She ambulated to therapy gym. Pt provided education/demonstration on fall recovery and floor transfer and what to do in case of fall. Following demonstration and VCs for technique, pt return demonstrated floor transfer with supervision. Educated regarding method of getting out of bath tub as pt reports she may soak in tub at d/c.   Completed table top puzzle while standing on non-compliant foam mat in order to strengthen stabilizer muscles. Pt  tolerated standing ~15 minutes with CGA for balance, however, required total A cuing for completing puzzle.  Following seated rest break, pt ambulated back to room at end of session. Left seated in recliner with chair belt alarm on and all needs in reach.   Therapy Documentation Precautions:  Precautions Precautions: Fall Restrictions Weight Bearing Restrictions: No  Pain:   No/denies pain   Therapy/Group: Individual Therapy  Tallyn Holroyd L 05/23/2018, 6:41 AM

## 2018-05-23 NOTE — Progress Notes (Signed)
Rebecca Johnston is a 48 y.o. female who is admitted for CIR with functional deficits in the setting of refractory status epilepticus  Past Medical History:  Diagnosis Date  . Anemia   . Asthma   . Diabetes mellitus without complication ([place]) 83/6629  . Fibroids   . GERD (gastroesophageal reflux disease)   . History of blood transfusion Feb 2015  . Obesity, Class III, BMI 40-49.9 (morbid obesity) ([place])   . Shortness of breath dyspnea      Subjective: No new complaints. No new problems.  Continues to have headaches.  Topamax being titrated.  Some benefit with analgesics.  Objective: Vital signs in last 24 hours: Temp:  [97.8 F (36.6 C)-98.5 F (36.9 C)] 97.8 F (36.6 C) (03/22 0617) Pulse Rate:  [76-85] 82 (03/22 0617) Resp:  [16-18] 16 (03/22 0617) BP: (97-123)/(61-87) 97/61 (03/22 0617) SpO2:  [100 %] 100 % (03/22 0617) Weight:  [118.9 kg] 118.9 kg (03/22 0617) Weight change: -0.8 kg Last BM Date: 05/22/18  Intake/Output from previous day: 03/21 0701 - 03/22 0700 In: 360 [P.O.:360] Out: -  Last cbgs: CBG (last 3)  Recent Labs    05/22/18 1642 05/22/18 2123 05/23/18 0622  GLUCAP 80 144* 111*     Physical Exam General: No apparent distress obese HEENT: not dry Lungs: Normal effort. Lungs clear to auscultation, no crackles or wheezes. Cardiovascular: Regular rate and rhythm, no edema Abdomen: S/NT/ND; BS(+) Musculoskeletal:  unchanged Neurological: No new neurological deficits with some generalized weakness Extremities.  No edema Skin: clear  Mental state: Alert, oriented, cooperative    Lab Results: BMET    Component Value Date/Time   NA 135 05/21/2018 0513   NA 139 04/16/2017 1501   K 4.3 05/21/2018 0513   CL 106 05/21/2018 0513   CO2 21 (L) 05/21/2018 0513   GLUCOSE 127 (H) 05/21/2018 0513   GLUCOSE 79 05/06/2013 1240   BUN 19 05/21/2018 0513   BUN 20 04/16/2017 1501   CREATININE 1.68 (H) 05/21/2018 0513   CREATININE 1.65 (H) 12/21/2015 1019    CALCIUM 9.1 05/21/2018 0513   GFRNONAA 36 (L) 05/21/2018 0513   GFRNONAA 37 (L) 12/21/2015 1019   GFRAA 41 (L) 05/21/2018 0513   GFRAA 43 (L) 12/21/2015 1019   CBC    Component Value Date/Time   WBC 7.9 05/17/2018 0640   RBC 4.28 05/17/2018 0640   HGB 9.4 (L) 05/17/2018 0640   HGB 12.6 04/16/2017 1501   HCT 31.4 (L) 05/17/2018 0640   HCT 38.5 04/16/2017 1501   PLT 267 05/17/2018 0640   PLT 352 04/16/2017 1501   MCV 73.4 (L) 05/17/2018 0640   MCV 67 (L) 04/16/2017 1501   MCH 22.0 (L) 05/17/2018 0640   MCHC 29.9 (L) 05/17/2018 0640   RDW 17.6 (H) 05/17/2018 0640   RDW 17.0 (H) 04/16/2017 1501   LYMPHSABS 1.4 05/17/2018 0640   MONOABS 0.7 05/17/2018 0640   EOSABS 0.2 05/17/2018 0640   BASOSABS 0.0 05/17/2018 0640    Medications: I have reviewed the patient's current medications.  Assessment/Plan:  Functional deficits following status epilepticus/encephalopathy.  Continue CIR Diabetes mellitus.  Nice glycemic control DVT prophylaxis continue Lovenox Pain management/headaches.  Continue analgesics and Topamax titration Chronic kidney disease continue to monitor    Length of stay, days: 9  Marletta Lor , MD 05/23/2018, 9:32 AM

## 2018-05-23 NOTE — Plan of Care (Signed)
  Problem: Consults Goal: RH GENERAL PATIENT EDUCATION Description See Patient Education module for education specifics. Outcome: Progressing   Problem: RH BOWEL ELIMINATION Goal: RH STG MANAGE BOWEL WITH ASSISTANCE Description STG Manage Bowel with Min Assistance.  Outcome: Progressing   Problem: RH SKIN INTEGRITY Goal: RH STG SKIN FREE OF INFECTION/BREAKDOWN Description Pt will remain free of skin breakdown and infection with min assist   Outcome: Progressing Goal: RH STG MAINTAIN SKIN INTEGRITY WITH ASSISTANCE Description STG Maintain Skin Integrity With [place].  Outcome: Progressing   Problem: RH PAIN MANAGEMENT Goal: RH STG PAIN MANAGED AT OR BELOW PT'S PAIN GOAL Description <3 out of 10.   Outcome: Progressing

## 2018-05-24 ENCOUNTER — Inpatient Hospital Stay (HOSPITAL_COMMUNITY): Payer: Medicaid Other | Admitting: Occupational Therapy

## 2018-05-24 ENCOUNTER — Inpatient Hospital Stay (HOSPITAL_COMMUNITY): Payer: Medicaid Other | Admitting: Physical Therapy

## 2018-05-24 ENCOUNTER — Inpatient Hospital Stay (HOSPITAL_COMMUNITY): Payer: Medicaid Other

## 2018-05-24 LAB — GLUCOSE, CAPILLARY
Glucose-Capillary: 108 mg/dL — ABNORMAL HIGH (ref 70–99)
Glucose-Capillary: 77 mg/dL (ref 70–99)
Glucose-Capillary: 75 mg/dL (ref 70–99)

## 2018-05-24 MED ORDER — TOPIRAMATE 25 MG PO TABS
50.0000 mg | ORAL_TABLET | Freq: Two times a day (BID) | ORAL | Status: DC
  Administered 2018-05-24 – 2018-05-26 (×4): 50 mg via ORAL
  Filled 2018-05-24 (×4): qty 2

## 2018-05-24 NOTE — Progress Notes (Signed)
Occupational Therapy Discharge Summary  Patient Details  Name: Rebecca Johnston MRN: [id number] Date of Birth: June 09, 1970   Patient has met 10 of 10 long term goals due to improved activity tolerance, improved balance, postural control, improved attention, improved awareness and improved coordination.  Patient to discharge at overall Supervision level.  Patient's care partner is independent to provide the necessary physical and cognitive assistance at discharge.  Pt's family not present during rehab admission and therefore no formal family education completed. Pt and CSW report pt's teenage children and pt's mother will be able to provide supervision assist at d/c. Recommending supervision assist 2/2 cognitive deficits believed to be related to medication as causing increased lethargy and fatigue. She is overall supervision assist while using RW.  Recommend use of tub transfer bench for seated bathing tasks 2/2 decreased functional activity tolerance and impaired balance.   Recommendation:  Patient will benefit from ongoing skilled OT services in home health setting to continue to advance functional skills in the area of BADL, iADL and Reduce care partner burden.  Equipment: Tub transfer bench  Reasons for discharge: treatment goals met and discharge from hospital  Patient/family agrees with progress made and goals achieved: Yes  OT Discharge Precautions/Restrictions  Precautions Precautions: Fall Restrictions Weight Bearing Restrictions: No Vision Baseline Vision/History: Wears glasses Wears Glasses: At all times Patient Visual Report: Blurring of vision Vision Assessment?: Vision impaired- to be further tested in functional context Additional Comments: Pt reports vision is back to baseline level, however, she demonstrates difficulty recognizing and responding to environmental obstacles in lower R quadrant Perception  Perception: Within Functional Limits Praxis Praxis:  Intact Cognition Overall Cognitive Status: Impaired/Different from baseline Arousal/Alertness: Awake/alert Orientation Level: Oriented X4 Sensation Sensation Light Touch: Appears Intact Coordination Gross Motor Movements are Fluid and Coordinated: Yes Fine Motor Movements are Fluid and Coordinated: Yes Coordination and Movement Description: Generalized weakness/ deconditioning Motor  Motor Motor: Within Functional Limits Motor - Discharge Observations: Generalized weakness/ deconditioning Trunk/Postural Assessment  Cervical Assessment Cervical Assessment: Exceptions to WFL(Forward head) Thoracic Assessment Thoracic Assessment: Exceptions to WFL(Rounded shoulders; Kyphotic) Lumbar Assessment Lumbar Assessment: Exceptions to WFL(Posterior pelvic tilt) Postural Control Postural Control: Deficits on evaluation(Delayed)  Balance Balance Balance Assessed: Yes Dynamic Sitting Balance Dynamic Sitting - Balance Support: During functional activity Dynamic Sitting - Level of Assistance: 6: Modified independent (Device/Increase time) Sitting balance - Comments: Sitting to complete bathing task Static Standing Balance Static Standing - Balance Support: During functional activity;No upper extremity supported Static Standing - Level of Assistance: 6: Modified independent (Device/Increase time);5: Stand by assistance Dynamic Standing Balance Dynamic Standing - Balance Support: During functional activity;No upper extremity supported Dynamic Standing - Level of Assistance: 6: Modified independent (Device/Increase time);5: Stand by assistance Dynamic Standing - Comments: Standing at sinkto complete grooming task Extremity/Trunk Assessment RUE Assessment RUE Assessment: Within Functional Limits LUE Assessment LUE Assessment: Within Functional Limits   Mishon Blubaugh L 05/24/2018, 7:17 AM

## 2018-05-24 NOTE — Progress Notes (Signed)
Occupational Therapy Session Note  Patient Details  Name: Rebecca Johnston MRN: [id number] Date of Birth: 1970-10-22  Today's Date: 05/24/2018 OT Individual Time: 0730-0830 OT Individual Time Calculation (min): 60 min    Short Term Goals: Week 2:  OT Short Term Goal 1 (Week 2): STG=LTG due to LOS  Skilled Therapeutic Interventions/Progress Updates:    Pt seen for OT ADL bathing/dressing session. Pt awake sitting up in bed upon arrival, complaints of chronic headache but willing to cont without intervention. PT voicing she now feels she needs to extend d/c date to get to mod I level. Therapist explained reasoning of supervision goals as it relates to cognitive deficits, pt voiced understanding, pt reports she will not have 24hr supervision at d/c. Therapist will follow up with CSW. Pt ambulated throughout room with supervision, cont to require VCs for hand placement on RW during sit<>stand and for RW management in functional context. She gathered clothing items from low drawers and towels from overhead rack with close supervision overall, one instance of min A required due to posterior bias.  She bathed seated on tub transfer bench with intermittent supervision, standing with supervision and use of grab bars to complete pericare/buttock hygiene. She returned to standard chair to dress with distant supervision. Grooming tasks completed standing at sink in order to increase functional activity tolerance, tolerating ~5 minutes in standing before seated rest break. Pt maintaining dynamic standing balance while using B UEs at functional level with supervision. Following seated rest break, pt ambulated without AD while returning meal tray to cart, ~42ft. Min A overall for balance with decreased walking speed. Returned to cart and ambulated remainder of way back to room with CGA, VCs not to reach out for furniture to assist with balance.   Pt left seated in recliner at end of session with chair belt alarm on  and all needs in reach.    Pt very motivated to return to work and PLOF. Encouraged pt to ask MD regarding clearance to return to activities.    Therapy Documentation Precautions:  Precautions Precautions: Fall Restrictions Weight Bearing Restrictions: No   Therapy/Group: Individual Therapy  Mozetta Murfin L 05/24/2018, 7:02 AM

## 2018-05-24 NOTE — Progress Notes (Signed)
Physical Therapy Session Note  Patient Details  Name: Rebecca Johnston MRN: [id number] Date of Birth: 01-07-1971  Today's Date: 05/24/2018 PT Individual Time: 0900-1015 PT Individual Time Calculation (min): 75 min   Short Term Goals: Week 2:  PT Short Term Goal 1 (Week 2): =LTG due to ELOS  Skilled Therapeutic Interventions/Progress Updates:    Pt received seated in recliner in room, agreeable to PT session. Pt reports ongoing headache, not rated and declines intervention. Pt is Supervision for transfers with use of RW throughout session. Ambulation x 200 ft with RW and Supervision with v/c for safe environment navigation and safe RW management. Ascend/descend 12 stairs with one handrail and Supervision. Sit to/from supine from flat bed at elevated height to simulate patient's bed height at home. Sidesteps L/R with RW and Supervision to simulate navigating narrow doorways in home environment. Ambulation up/down ramp and across uneven surface with RW and Supervision with v/c for safety. Standing balance: ball toss with narrow BOS and no UE support, one minor LOB to the R that pt is able to self-correct. Static standing balance with no UE support: Romberg stance and modified tandem stance 2 x 30 sec each. Pt left seated in recliner in room with needs in reach, quick release belt and chair alarm in place.  Therapy Documentation Precautions:  Precautions Precautions: Fall Restrictions Weight Bearing Restrictions: No    Therapy/Group: Individual Therapy   Excell Seltzer, PT, DPT  05/24/2018, 12:48 PM

## 2018-05-24 NOTE — Progress Notes (Signed)
Slept for 6 hours this shift. Given tylenol at hs for c/o headache with minimal effects noted, pt reassessed and found to be resting with eyes closed. No other complaints verbalized. Awakening only for toileting x2 this shift. HS snack given per request. Uneventful night.

## 2018-05-24 NOTE — Progress Notes (Signed)
Speech Language Pathology Daily Session Note  Patient Details  Name: Rebecca Johnston MRN: [id number] Date of Birth: 27-Mar-1970  Today's Date: 05/24/2018 SLP Individual Time: 1102-1200 SLP Individual Time Calculation (min): 58 min  Short Term Goals: Week 2: SLP Short Term Goal 1 (Week 2): STG=LTG due to ELOS   Skilled Therapeutic Interventions:Skilled ST services focused on cognitive skills. SLP facilitated complex problem solving skills utilizing structured inferencing and planning tasks x 3, pt required supervision A verbal for 2 out 3 tasks and required mod A verbal cues for 1 out 3 task due to increased complexity of directional langauge. Pt required initial verbal cues to utilize memory strategies, note taking, during complex problem solving's tasks. Pt was left in room with call bell within reach and chair alarm set. ST recommends to continue skilled ST services.      Pain Pain Assessment Pain Score: 0-No pain  Therapy/Group: Individual Therapy  Rebecca Johnston  [facility] 05/24/2018, 12:56 PM

## 2018-05-24 NOTE — Progress Notes (Signed)
While passing room this nurse noted pt to be on phone, speech was clear and fluent without delays as noted previously.

## 2018-05-24 NOTE — Progress Notes (Signed)
[place] PHYSICAL MEDICINE & REHABILITATION PROGRESS NOTE   Subjective/Complaints: Up in chair. Feels that she's doing better. Headaches are still present but more intermittent now. Asked about when she can return to work.   ROS: Patient denies fever, rash, sore throat, blurred vision, nausea, vomiting, diarrhea, cough, shortness of breath or chest pain, joint or back pain, headache, or mood change. .     Objective:   No results found. No results for input(s): WBC, HGB, HCT, PLT in the last 72 hours. No results for input(s): NA, K, CL, CO2, GLUCOSE, BUN, CREATININE, CALCIUM in the last 72 hours.  Intake/Output Summary (Last 24 hours) at 05/24/2018 0935 Last data filed at 05/24/2018 0827 Gross per 24 hour  Intake 840 ml  Output -  Net 840 ml     Physical Exam: Vital Signs Blood pressure 111/63, pulse 78, temperature 98.1 F (36.7 C), temperature source Oral, resp. rate 16, height 5\' 5"  (1.651 m), weight 119.3 kg, last menstrual period 11/26/2014, SpO2 100 %. Constitutional: No distress . Vital signs reviewed. HEENT: EOMI, oral membranes moist Neck: supple Cardiovascular: RRR without murmur. No JVD    Respiratory: CTA Bilaterally without wheezes or rales. Normal effort    GI: BS +, non-tender, non-distended  Musculoskeletal:Normal range of motion. no focal tenderness General: Edema(BLE)present. No tendernessor deformity.  Neurological: More alert. Speech more articulate. Improving insight and awareness. Reviewed her vocational issues, job responsibilities today.    Moves UE 4/5 prox to distal. LE: 4/5 prox to 4/5 distally. Sensed pain in all 4's. CN non-focal    Skin: Skin isdry. She isnot diaphoretic.  Psychiatric:more dynamic    Assessment/Plan: 1. Functional deficits secondary to status epilepticus/encephalopathy which require 3+ hours per day of interdisciplinary therapy in a comprehensive inpatient rehab setting.  Physiatrist is providing close team  supervision and 24 hour management of active medical problems listed below.  Physiatrist and rehab team continue to assess barriers to discharge/monitor patient progress toward functional and medical goals  Care Tool:  Bathing    Body parts bathed by patient: Right arm, Left arm, Chest, Abdomen, Front perineal area, Buttocks, Right upper leg, Left upper leg, Right lower leg, Left lower leg, Face         Bathing assist Assist Level: Supervision/Verbal cueing     Upper Body Dressing/Undressing Upper body dressing   What is the patient wearing?: Bra, Pull over shirt    Upper body assist Assist Level: Independent    Lower Body Dressing/Undressing Lower body dressing      What is the patient wearing?: Underwear/pull up, Pants     Lower body assist Assist for lower body dressing: Supervision/Verbal cueing     Toileting Toileting Toileting Activity did not occur (Clothing management and hygiene only): N/A (no void or bm)  Toileting assist Assist for toileting: Supervision/Verbal cueing     Transfers Chair/bed transfer  Transfers assist     Chair/bed transfer assist level: Supervision/Verbal cueing     Locomotion Ambulation   Ambulation assist      Assist level: Contact Guard/Touching assist Assistive device: Walker-rolling Max distance: 150'   Walk 10 feet activity   Assist  Walk 10 feet activity did not occur: Safety/medical concerns  Assist level: Contact Guard/Touching assist Assistive device: Walker-rolling   Walk 50 feet activity   Assist Walk 50 feet with 2 turns activity did not occur: Safety/medical concerns  Assist level: Contact Guard/Touching assist Assistive device: Walker-rolling    Walk 150 feet activity   Assist  Walk 150 feet activity did not occur: Safety/medical concerns  Assist level: Contact Guard/Touching assist Assistive device: Walker-rolling    Walk 10 feet on uneven surface  activity   Assist Walk 10 feet on  uneven surfaces activity did not occur: Safety/medical concerns         Wheelchair     Assist Will patient use wheelchair at discharge?: No   Wheelchair activity did not occur: N/A         Wheelchair 50 feet with 2 turns activity    Assist    Wheelchair 50 feet with 2 turns activity did not occur: N/A       Wheelchair 150 feet activity     Assist Wheelchair 150 feet activity did not occur: N/A        Medical Problem List and Plan: 1.Decreased functional mobility with headache dizzinesssecondary to refractory focal status epilepticus possibly precipitated by severe hyperglycemia.Additionally debilitated from multiple medical issues and prolonged hospital stay --Continue CIR therapies including PT, OT, and SLP   -believe she can reach mod I goals with family check in 2. Antithrombotics: -DVT/anticoagulation:ambulating, dc lovenox  -antiplatelet therapy: none 3. Pain Management:c/o of right temporal headache (which was BEFORE the seizures). Could be some form of vascular/migraine    -increase topamax to 50 mg BID on 3/19     -added fioricet for breakthrough headache 4. Mood:provide emotional support  -antipsychotic agents: none 5. Neuropsych: This patientiscapable of making decisions on herown behalf. 6. Skin/Wound Care:Routine skin checks 7. Fluids/Electrolytes/Nutrition:   -encourage PO intake   -I personally reviewed the patient's labs today.    -dietary consult  -hyponatremia --134 3/19 8. Seizures/status epilepticus potentially due to hyperglycemia:  -appreciate neuro follow up, they have signed off  - keppra reduced to 750mg  bid   -reduced Lyrica 100 mg 3 times a day  -dose reductions have helped mentation  -Pt off phenobarbital and dilantin -no further seizure activity   9.Diabetes mellitus with peripheral neuropathy. Hemoglobin A1c 14.9.    -sugars  controlled off meds!  -continue CM diet and dietary ed 10. Dysphagia. Diet advanced to regular. 11. Asthma with remote history of tobacco abuse. Continue nebulizers 12. Morbid obesity. BMI 43.44. Dietary follow-up 13.CKD stage III.  BUN/Cr stabilized today at 19/1.68  -continue to encourage fluids  -follow up lab tomorrow  LOS: 10 days A FACE TO FACE EVALUATION WAS PERFORMED  Meredith Staggers 05/24/2018, 9:35 AM

## 2018-05-25 ENCOUNTER — Inpatient Hospital Stay (HOSPITAL_COMMUNITY): Payer: Medicaid Other | Admitting: Speech Pathology

## 2018-05-25 ENCOUNTER — Encounter (HOSPITAL_COMMUNITY): Payer: Medicaid Other | Admitting: Psychology

## 2018-05-25 ENCOUNTER — Inpatient Hospital Stay (HOSPITAL_COMMUNITY): Payer: Medicaid Other | Admitting: Occupational Therapy

## 2018-05-25 ENCOUNTER — Inpatient Hospital Stay (HOSPITAL_COMMUNITY): Payer: Medicaid Other

## 2018-05-25 LAB — CBC
HCT: 33.8 % — ABNORMAL LOW (ref 36.0–46.0)
Hemoglobin: 9.9 g/dL — ABNORMAL LOW (ref 12.0–15.0)
MCH: 21.6 pg — ABNORMAL LOW (ref 26.0–34.0)
MCHC: 29.3 g/dL — ABNORMAL LOW (ref 30.0–36.0)
MCV: 73.8 fL — ABNORMAL LOW (ref 80.0–100.0)
PLATELETS: 278 10*3/uL (ref 150–400)
RBC: 4.58 MIL/uL (ref 3.87–5.11)
RDW: 17.9 % — AB (ref 11.5–15.5)
WBC: 6.7 10*3/uL (ref 4.0–10.5)
nRBC: 0 % (ref 0.0–0.2)

## 2018-05-25 LAB — GLUCOSE, CAPILLARY
GLUCOSE-CAPILLARY: 103 mg/dL — AB (ref 70–99)
Glucose-Capillary: 144 mg/dL — ABNORMAL HIGH (ref 70–99)
Glucose-Capillary: 89 mg/dL (ref 70–99)
Glucose-Capillary: 124 mg/dL — ABNORMAL HIGH (ref 70–99)
Glucose-Capillary: 143 mg/dL — ABNORMAL HIGH (ref 70–99)

## 2018-05-25 LAB — BASIC METABOLIC PANEL
Anion gap: 13 (ref 5–15)
BUN: 19 mg/dL (ref 6–20)
CO2: 20 mmol/L — ABNORMAL LOW (ref 22–32)
CREATININE: 1.76 mg/dL — AB (ref 0.44–1.00)
Calcium: 9.3 mg/dL (ref 8.9–10.3)
Chloride: 105 mmol/L (ref 98–111)
GFR calc non Af Amer: 34 mL/min — ABNORMAL LOW (ref >60)
GFR, EST AFRICAN AMERICAN: 39 mL/min — AB (ref >60)
Glucose, Bld: 113 mg/dL — ABNORMAL HIGH (ref 70–99)
Potassium: 4.4 mmol/L (ref 3.5–5.1)
Sodium: 138 mmol/L (ref 135–145)

## 2018-05-25 NOTE — Progress Notes (Signed)
[place] PHYSICAL MEDICINE & REHABILITATION PROGRESS NOTE   Subjective/Complaints: Pt up and about with therapy early this morning. Headaches seem to be improving. Slept last night. RN noted pt speaking fluently on phone yesterday afternoon.   ROS: Patient denies fever, rash, sore throat, blurred vision, nausea, vomiting, diarrhea, cough, shortness of breath or chest pain, joint or back pain or mood change.    Objective:   No results found. Recent Labs    05/25/18 0518  WBC 6.7  HGB 9.9*  HCT 33.8*  PLT 278   Recent Labs    05/25/18 0518  NA 138  K 4.4  CL 105  CO2 20*  GLUCOSE 113*  BUN 19  CREATININE 1.76*  CALCIUM 9.3    Intake/Output Summary (Last 24 hours) at 05/25/2018 1057 Last data filed at 05/24/2018 2000 Gross per 24 hour  Intake 560 ml  Output -  Net 560 ml     Physical Exam: Vital Signs Blood pressure 128/74, pulse 75, temperature 97.8 F (36.6 C), temperature source Oral, resp. rate 17, height 5\' 5"  (1.651 m), weight 119.3 kg, last menstrual period 11/26/2014, SpO2 100 %. Constitutional: No distress . Vital signs reviewed. HEENT: EOMI, oral membranes moist Neck: supple Cardiovascular: RRR without murmur. No JVD    Respiratory: CTA Bilaterally without wheezes or rales. Normal effort    GI: BS +, non-tender, non-distended  Musculoskeletal:Normal range of motion. no focal tenderness General: Edema(BLE)present. No tendernessor deformity.  Neurological: Improving speech/articulation.    Moves UE 4/5 prox to distal. LE: 4/5 prox to 4/5 distally. Sensed pain in all 4's. CN remains non-focal    Skin: Skin isdry. She isnot diaphoretic.  Psychiatric:more dynamic    Assessment/Plan: 1. Functional deficits secondary to status epilepticus/encephalopathy which require 3+ hours per day of interdisciplinary therapy in a comprehensive inpatient rehab setting.  Physiatrist is providing close team supervision and 24 hour management of active  medical problems listed below.  Physiatrist and rehab team continue to assess barriers to discharge/monitor patient progress toward functional and medical goals  Care Tool:  Bathing    Body parts bathed by patient: Right arm, Left arm, Chest, Abdomen, Front perineal area, Buttocks, Right upper leg, Left upper leg, Right lower leg, Left lower leg, Face         Bathing assist Assist Level: Set up assist     Upper Body Dressing/Undressing Upper body dressing   What is the patient wearing?: Bra, Pull over shirt    Upper body assist Assist Level: Independent    Lower Body Dressing/Undressing Lower body dressing      What is the patient wearing?: Underwear/pull up, Pants     Lower body assist Assist for lower body dressing: Independent with assitive device Assistive Device Comment: Standing with RW   Toileting Toileting Toileting Activity did not occur (Clothing management and hygiene only): N/A (no void or bm)  Toileting assist Assist for toileting: Independent with assistive device Assistive Device Comment: RW   Transfers Chair/bed transfer  Transfers assist     Chair/bed transfer assist level: Independent with assistive device     Locomotion Ambulation   Ambulation assist      Assist level: Supervision/Verbal cueing Assistive device: Walker-rolling Max distance: 200'   Walk 10 feet activity   Assist  Walk 10 feet activity did not occur: Safety/medical concerns  Assist level: Supervision/Verbal cueing Assistive device: Walker-rolling   Walk 50 feet activity   Assist Walk 50 feet with 2 turns activity did not occur:  Safety/medical concerns  Assist level: Supervision/Verbal cueing Assistive device: Walker-rolling    Walk 150 feet activity   Assist Walk 150 feet activity did not occur: Safety/medical concerns  Assist level: Supervision/Verbal cueing Assistive device: Walker-rolling    Walk 10 feet on uneven surface  activity   Assist  Walk 10 feet on uneven surfaces activity did not occur: Safety/medical concerns         Wheelchair     Assist Will patient use wheelchair at discharge?: No   Wheelchair activity did not occur: N/A         Wheelchair 50 feet with 2 turns activity    Assist    Wheelchair 50 feet with 2 turns activity did not occur: N/A       Wheelchair 150 feet activity     Assist Wheelchair 150 feet activity did not occur: N/A        Medical Problem List and Plan: 1.Decreased functional mobility with headache dizzinesssecondary to refractory focal status epilepticus possibly precipitated by severe hyperglycemia.Additionally debilitated from multiple medical issues and prolonged hospital stay ---Interdisciplinary Team Conference today     -believe she can reach mod I goals with family check in 2. Antithrombotics: -DVT/anticoagulation:ambulating, dc lovenox  -antiplatelet therapy: none 3. Pain Management:c/o of right temporal headache (which was BEFORE the seizures). Could be some form of vascular/migraine    -continue topamax 50 mg BID (3/19)     -added fioricet for breakthrough headache 4. Mood:provide emotional support  -antipsychotic agents: none  -asked neuropsych to see pt re: coping skills, ?secondary gain 5. Neuropsych: This patientiscapable of making decisions on herown behalf. 6. Skin/Wound Care:Routine skin checks 7. Fluids/Electrolytes/Nutrition:   -encourage PO intake   -dietary consult  -hyponatremia --134 3/19 8. Seizures/status epilepticus potentially due to hyperglycemia:  -appreciate neuro follow up, they have signed off  - keppra reduced to 750mg  bid   -reduced Lyrica 100 mg 3 times a day  -dose reductions have helped mentation  -Pt off phenobarbital and dilantin -no further seizure activity   9.Diabetes mellitus with peripheral neuropathy. Hemoglobin A1c 14.9.     -sugars controlled off meds which is quite amazing given her Hgb A1C, likely sheds some light on her habits PTA.  -continue CM diet and dietary ed 10. Dysphagia. Diet advanced to regular. 11. Asthma with remote history of tobacco abuse. Continue nebulizers 12. Morbid obesity. BMI 43.44. Dietary follow-up 13.CKD stage III.  BUN/Cr holding at 19/1.76   LOS: 11 days A FACE TO FACE EVALUATION WAS PERFORMED  Meredith Staggers 05/25/2018, 10:57 AM

## 2018-05-25 NOTE — Progress Notes (Signed)
Occupational Therapy Session Note  Patient Details  Name: Rebecca Johnston MRN: [id number] Date of Birth: February 15, 1971  Today's Date: 05/25/2018 OT Individual Time: 1443-1540 OT Individual Time Calculation (min): 55 min    Short Term Goals: Week 2:  OT Short Term Goal 1 (Week 2): STG=LTG due to LOS  Skilled Therapeutic Interventions/Progress Updates:    Pt seen for OT ADL bathing/dressing session. Pt asleep in supine upon arrival, increased time for arousal, but with encouragement pt willing to get up and participate. Pt reports being pre-medicated prior to tx session, 5/10 headache pain but willing to participate in therapy as able.   She ambulated throughout room with RW and supervision. She gathered clothing and shower supplies from low drawers and overhead cabinets with supervision. Improved RW management in functional contexts today compared to previous sessions. She bathed seated on tub transfer bench with set-up and intermittent supervision. She returned to standard chair to dress, dressing at mod I level. Grooming tasks completed standing at sink with distant supervision, leaning into sink for support while using B UEs to complete grooming tasks, tolerating ~5 minutes in standing before requiring seated rest break.  Pt gathered dirty laundry items from floor and organized room with supervision and VCs for safety awareness and RW management.  Pt sat to don TED hose and shoes independently.  Following seated rest break, she ambulated without AD while carrying meal tray to return to meal rack. CGA overall with one episode of scissoring requiring min A to regain balance.  Pt returned to room, left seated in recliner at end of session, all needs in reach.   Education provided throughout session regarding d/c planning, continuum of care, energy conservation and reducing risk of falls.   Therapy Documentation Precautions:  Precautions Precautions: Fall Restrictions Weight Bearing  Restrictions: No   Therapy/Group: Individual Therapy  Lucca Ballo L 05/25/2018, 6:59 AM

## 2018-05-25 NOTE — Discharge Summary (Signed)
Physician Discharge Summary  Patient ID: Rebecca Johnston MRN: [id number] DOB/AGE: 07-18-1970 48 y.o.  Admit date: 05/14/2018 Discharge date: 05/26/2018  Discharge Diagnoses:  Active Problems:   Seizure disorder [facility]) DVT prophylaxis Pain management Mood Diabetes mellitus Dysphagia-resolved Asthma with remote history of tobacco use Morbid obesity CKD stage III  Discharged Condition: stable  Significant Diagnostic Studies: Ct Angio Head W Or Wo Contrast  Result Date: 05/07/2018 CLINICAL DATA:  RIGHT-sided headache. Symptoms worse after starting antibiotics for pneumonia. Dizziness. History of diabetes. EXAM: CT ANGIOGRAPHY HEAD AND NECK TECHNIQUE: Multidetector CT imaging of the head and neck was performed using the standard protocol during bolus administration of intravenous contrast. Multiplanar CT image reconstructions and MIPs were obtained to evaluate the vascular anatomy. Carotid stenosis measurements (when applicable) are obtained utilizing NASCET criteria, using the distal internal carotid diameter as the denominator. CONTRAST:  17mL OMNIPAQUE IOHEXOL 350 MG/ML SOLN COMPARISON:  MRI head May 06, 2018 FINDINGS: CT HEAD FINDINGS BRAIN: No intraparenchymal hemorrhage, mass effect nor midline shift. The ventricles and sulci are normal. No acute large vascular territory infarcts. No abnormal extra-axial fluid collections. Basal cisterns are patent. VASCULAR: Unremarkable. SKULL/SOFT TISSUES: No skull fracture. No significant soft tissue swelling. ORBITS/SINUSES: The included ocular globes and orbital contents are normal.Trace paranasal sinus mucosal thickening. Mastoid air cells are well aerated. OTHER: None. CTA NECK FINDINGS: AORTIC ARCH: Normal appearance of the thoracic arch, normal branch pattern. The origins of the innominate, left Common carotid artery and subclavian artery are patent. RIGHT CAROTID SYSTEM: Common carotid artery is patent, medial course. Normal appearance of the carotid  bifurcation without hemodynamically significant stenosis by NASCET criteria. Normal appearance of the internal carotid artery. LEFT CAROTID SYSTEM: Common carotid artery is patent, medial course. Normal appearance of the carotid bifurcation without hemodynamically significant stenosis by NASCET criteria. Normal appearance of the internal carotid artery. VERTEBRAL ARTERIES:Codominant patent vertebral arteries. SKELETON: No acute osseous process though bone windows have not been submitted. Mild cervical spondylosis. OTHER NECK: Soft tissues of the neck are nonacute though, not tailored for evaluation. UPPER CHEST: Included lung apices are clear. No superior mediastinal lymphadenopathy. CTA HEAD FINDINGS: ANTERIOR CIRCULATION: Patent cervical internal carotid arteries, petrous, cavernous and supra clinoid internal carotid arteries. Patent anterior communicating artery. Patent anterior and middle cerebral arteries. No large vessel occlusion, flow-limiting stenosis, contrast extravasation or aneurysm. POSTERIOR CIRCULATION: Patent vertebral arteries, vertebrobasilar junction and basilar artery, as well as main branch vessels. Patent posterior cerebral arteries. Small bilateral posterior communicating arteries present. Chest No large vessel occlusion, flow-limiting stenosis, contrast extravasation or aneurysm. VENOUS SINUSES: Major dural venous sinuses are patent though not tailored for evaluation on this angiographic examination. ANATOMIC VARIANTS: None. DELAYED PHASE: Not performed. MIP images reviewed. IMPRESSION: 1. Normal CT HEAD with and without contrast. 2. Normal CTA HEAD. 3. Normal CTA NECK. Electronically Signed   By: Elon Alas M.D.   On: 05/07/2018 00:50   Dg Chest 2 View  Result Date: 05/06/2018 CLINICAL DATA:  Headache, cough, recent history of pneumonia. EXAM: CHEST - 2 VIEW COMPARISON:  Chest x-rays dated 04/29/2018 and 04/27/2013. FINDINGS: Study is hypoinspiratory with crowding of the perihilar  and bibasilar bronchovascular markings. Given the low lung volumes, lungs appear clear. No evidence of consolidating pneumonia. No pleural effusion or pneumothorax seen. Heart size and mediastinal contours are within normal limits. No acute or suspicious osseous finding. IMPRESSION: Low lung volumes. No active cardiopulmonary disease. No evidence of pneumonia. Electronically Signed   By: Roxy Horseman.D.  On: 05/06/2018 13:58   Dg Chest 2 View  Result Date: 04/29/2018 CLINICAL DATA:  Chest pain and shortness of breath.  Hyperglycemia. EXAM: CHEST - 2 VIEW COMPARISON:  Chest radiograph April 27, 2013 and chest CT December 15, 2013 FINDINGS: There is slight atelectasis in the lingula. The lungs elsewhere are clear. The heart size and pulmonary vascularity are normal. No adenopathy. No bone lesions. No pneumothorax. IMPRESSION: Slight lingular atelectasis. No edema or consolidation. Heart size normal. Electronically Signed   By: Lowella Grip III M.D.   On: 04/29/2018 09:04   Ct Head Wo Contrast  Result Date: 05/03/2018 CLINICAL DATA:  Pneumonia, headache EXAM: CT HEAD WITHOUT CONTRAST TECHNIQUE: Contiguous axial images were obtained from the base of the skull through the vertex without intravenous contrast. COMPARISON:  None. FINDINGS: Brain: No evidence of acute infarction, hemorrhage, hydrocephalus, extra-axial collection or mass lesion/mass effect. Vascular: No hyperdense vessel or unexpected calcification. Skull: Normal. Negative for fracture or focal lesion. Sinuses/Orbits: No acute finding. Other: None. IMPRESSION: No acute intracranial pathology. No non-contrast CT findings to explain headache. Electronically Signed   By: Eddie Candle M.D.   On: 05/03/2018 13:33   Ct Head W & Wo Contrast  Result Date: 05/08/2018 CLINICAL DATA:  Seizure, new, abnormal neuro exam. EXAM: CT HEAD WITHOUT AND WITH CONTRAST TECHNIQUE: Contiguous axial images were obtained from the base of the skull through the  vertex without and with intravenous contrast CONTRAST:  80mL OMNIPAQUE IOHEXOL 300 MG/ML  SOLN COMPARISON:  CTA head and neck 05/08/2018.  MRI brain 05/06/2018. FINDINGS: Brain: No acute infarct, hemorrhage, or mass lesion is present. The ventricles are of normal size. No significant extraaxial fluid collection is present. The brainstem and cerebellum are within normal limits. Postcontrast images demonstrate no pathologic enhancement. Vascular: No hyperdense vessel or unexpected calcification. Visible vessels are patent. Skull: Calvarium is intact. No focal lytic or blastic lesions are present. Sinuses/Orbits: The paranasal sinuses and mastoid air cells are clear. The globes and orbits are within normal limits. IMPRESSION: 1. Normal CT of the head without and with contrast. Electronically Signed   By: San Morelle M.D.   On: 05/08/2018 19:41   Ct Angio Neck W And/or Wo Contrast  Result Date: 05/07/2018 CLINICAL DATA:  RIGHT-sided headache. Symptoms worse after starting antibiotics for pneumonia. Dizziness. History of diabetes. EXAM: CT ANGIOGRAPHY HEAD AND NECK TECHNIQUE: Multidetector CT imaging of the head and neck was performed using the standard protocol during bolus administration of intravenous contrast. Multiplanar CT image reconstructions and MIPs were obtained to evaluate the vascular anatomy. Carotid stenosis measurements (when applicable) are obtained utilizing NASCET criteria, using the distal internal carotid diameter as the denominator. CONTRAST:  28mL OMNIPAQUE IOHEXOL 350 MG/ML SOLN COMPARISON:  MRI head May 06, 2018 FINDINGS: CT HEAD FINDINGS BRAIN: No intraparenchymal hemorrhage, mass effect nor midline shift. The ventricles and sulci are normal. No acute large vascular territory infarcts. No abnormal extra-axial fluid collections. Basal cisterns are patent. VASCULAR: Unremarkable. SKULL/SOFT TISSUES: No skull fracture. No significant soft tissue swelling. ORBITS/SINUSES: The included  ocular globes and orbital contents are normal.Trace paranasal sinus mucosal thickening. Mastoid air cells are well aerated. OTHER: None. CTA NECK FINDINGS: AORTIC ARCH: Normal appearance of the thoracic arch, normal branch pattern. The origins of the innominate, left Common carotid artery and subclavian artery are patent. RIGHT CAROTID SYSTEM: Common carotid artery is patent, medial course. Normal appearance of the carotid bifurcation without hemodynamically significant stenosis by NASCET criteria. Normal appearance of the internal carotid  artery. LEFT CAROTID SYSTEM: Common carotid artery is patent, medial course. Normal appearance of the carotid bifurcation without hemodynamically significant stenosis by NASCET criteria. Normal appearance of the internal carotid artery. VERTEBRAL ARTERIES:Codominant patent vertebral arteries. SKELETON: No acute osseous process though bone windows have not been submitted. Mild cervical spondylosis. OTHER NECK: Soft tissues of the neck are nonacute though, not tailored for evaluation. UPPER CHEST: Included lung apices are clear. No superior mediastinal lymphadenopathy. CTA HEAD FINDINGS: ANTERIOR CIRCULATION: Patent cervical internal carotid arteries, petrous, cavernous and supra clinoid internal carotid arteries. Patent anterior communicating artery. Patent anterior and middle cerebral arteries. No large vessel occlusion, flow-limiting stenosis, contrast extravasation or aneurysm. POSTERIOR CIRCULATION: Patent vertebral arteries, vertebrobasilar junction and basilar artery, as well as main branch vessels. Patent posterior cerebral arteries. Small bilateral posterior communicating arteries present. Chest No large vessel occlusion, flow-limiting stenosis, contrast extravasation or aneurysm. VENOUS SINUSES: Major dural venous sinuses are patent though not tailored for evaluation on this angiographic examination. ANATOMIC VARIANTS: None. DELAYED PHASE: Not performed. MIP images  reviewed. IMPRESSION: 1. Normal CT HEAD with and without contrast. 2. Normal CTA HEAD. 3. Normal CTA NECK. Electronically Signed   By: Elon Alas M.D.   On: 05/07/2018 00:50   Mr Brain Wo Contrast  Result Date: 05/06/2018 CLINICAL DATA:  48 y/o  F; worsening right-sided headache. EXAM: MRI HEAD WITHOUT CONTRAST TECHNIQUE: Axial DWI, axial T2, axial T2 propeller sequences were acquired. COMPARISON:  05/03/2018 CT head FINDINGS: Brain: No reduced diffusion to suggest acute or early subacute infarction. No abnormal T2 or T2 FLAIR weighted signal of the brain. No mass effect, gross extra-axial collection, hydrocephalus, or herniation is evident. Vascular: Normal T2 flow voids. Skull and upper cervical spine: Normal T2 signal. Sinuses/Orbits: Negative. Other: Partially visualized left larger than right prominent parotid gland ducts. IMPRESSION: 1. Limited sequences. 2. No acute stroke, hemorrhage, or mass effect identified. 3. Partially visualized left larger than right prominent parotid gland ducts, possibly physiologic or related to downstream obstruction. Electronically Signed   By: Kristine Garbe M.D.   On: 05/06/2018 18:27   Dg Chest Port 1 View  Result Date: 05/12/2018 CLINICAL DATA:  Shortness of breath EXAM: PORTABLE CHEST 1 VIEW COMPARISON:  May 09, 2018 FINDINGS: Endotracheal tube no longer apparent. Central catheter has been removed as well. Feeding tube tip is below the diaphragm. No pneumothorax. There is patchy atelectatic change in both lower lobes. Lungs elsewhere clear. Heart is upper normal in size with pulmonary vascularity normal. No adenopathy. No bone lesions. IMPRESSION: Feeding tube tip below diaphragm. No pneumothorax. Lower lobe atelectatic change bilaterally. A degree of superimposed pneumonia in these areas cannot be excluded entirely. Lungs elsewhere clear. Stable cardiac silhouette. Electronically Signed   By: Lowella Grip III M.D.   On: 05/12/2018 08:11   Dg  Chest Port 1 View  Result Date: 05/09/2018 CLINICAL DATA:  Central line placement EXAM: PORTABLE CHEST 1 VIEW COMPARISON:  Chest radiograph from earlier today. FINDINGS: Endotracheal tube tip is 2.2 cm above the carina. Enteric tube terminates in the gastric fundus. Right internal jugular central venous catheter terminates in the lower third of the SVC. Stable cardiomediastinal silhouette with top-normal heart size. No pneumothorax. No pleural effusion. Low lung volumes with curvilinear bibasilar lung opacities. No pulmonary edema. IMPRESSION: 1. Well-positioned lines and tubes. No pneumothorax. 2. Low lung volumes with curvilinear bibasilar lung opacities, favor atelectasis. Electronically Signed   By: Ilona Sorrel M.D.   On: 05/09/2018 19:54   Dg Chest [facility]  1 View  Result Date: 05/09/2018 CLINICAL DATA:  Intubation. EXAM: PORTABLE CHEST 1 VIEW COMPARISON:  05/06/2018 FINDINGS: Endotracheal tube terminates 2.7 cm above carina. Nasogastric tube terminates at the body of the stomach. Extremely low lung volumes. Normal heart size for level of inspiration. No pneumothorax. Bibasilar pulmonary opacities are increased on the left and new on the right. There may be minimal fluid in the right minor fissure. IMPRESSION: Appropriate position of endotracheal tube. Diminished lung volumes with new and increased bibasilar airspace disease, favored to represent atelectasis. Suspect minimal fluid tracking within the right minor fissure. Electronically Signed   By: Abigail Miyamoto M.D.   On: 05/09/2018 15:45   Dg Swallowing Func-speech Pathology  Result Date: 05/13/2018 Objective Swallowing Evaluation: Type of Study: MBS-Modified Barium Swallow Study  Patient Details Name: AZULA ZAPPIA MRN: [id number] Date of Birth: 12/19/1970 Today's Date: 05/13/2018 Time: SLP Start Time (ACUTE ONLY): 7824 -SLP Stop Time (ACUTE ONLY): 1505 SLP Time Calculation (min) (ACUTE ONLY): 20 min Past Medical History: Past Medical History: Diagnosis  Date . Anemia  . Asthma  . Diabetes mellitus without complication ([place]) 23/5361 . Fibroids  . GERD (gastroesophageal reflux disease)  . History of blood transfusion Feb 2015 . Obesity, Class III, BMI 40-49.9 (morbid obesity) ([place])  . Shortness of breath dyspnea  Past Surgical History: Past Surgical History: Procedure Laterality Date . ABDOMINAL HYSTERECTOMY N/A 12/08/2014  Procedure: HYSTERECTOMY ABDOMINAL;  Surgeon: Woodroe Mode, MD;  Location: [facility];  Service: Gynecology;  Laterality: N/A; . DILATION AND CURETTAGE OF UTERUS   . DILITATION & CURRETTAGE/HYSTROSCOPY WITH VERSAPOINT RESECTION N/A 01/06/2014  Procedure: Corena Pilgrim WITH VERSAPOINT;  Surgeon: Woodroe Mode, MD;  Location: [facility];  Service: Gynecology;  Laterality: N/A; . LAPAROSCOPIC LYSIS OF ADHESIONS N/A 12/08/2014  Procedure: LAPAROSCOPIC LYSIS OF ADHESIONS;  Surgeon: Michael Boston, MD;  Location: [facility];  Service: General;  Laterality: N/A; . OVARIAN CYST REMOVAL    cyst not removed. Cyst drained . SUPRA-UMBILICAL HERNIA N/A 44/05/1538  Procedure: LAPARSCOPIC SUPRA-UMBILICAL HERNIA REPAIR ;  Surgeon: Michael Boston, MD;  Location: [facility];  Service: General;  Laterality: N/A; . VENTRAL HERNIA REPAIR N/A 12/08/2014  Procedure: LAPAROSCOPIC INCARCERATED INCISIONAL VENTRAL WALL HERNIA REPAIR;  Surgeon: Michael Boston, MD;  Location: [facility];  Service: General;  Laterality: N/A; HPI: Pt is a 48 y.o. female with medical history significant of diabetes mellitus, asthma, cardiac, obesity, CKD stage III, who presented with headache, blurry vision, dizziness. Patient stated that she has been having headaches for more than 1 week in the right temporal area, constant, 8 out of 10 severity, nonradiating. She also reported right leg weakness, slurred speech, and facial droop. CT of the head and MRI were negative for acute pathology. Pt was found obtunded only responsive to pain on 3/8 and was intubated from 05/09/18 to 05/11/18.  EEG 3/6 right temporal complex partial  seizure. EEG 3/10 showed evidence of an improving diffuse encephalopathy. Chest x-ray of 05/11/18 revealed lower lobe atelectatic change bilaterally but a degree of superimposed pneumonia in these areas could not be excluded entirely.  No data recorded Assessment / Plan / Recommendation CHL IP CLINICAL IMPRESSIONS 05/13/2018 Clinical Impression Pt presents with oropharyngeal dysphagia characterized by impaired A-P transport, impaired bolus control, a pharyngeal delay, and reduced laryngeal excursion. She demonstrated lingual pumping, premature spillage to the valleculae, and penetration (PAS 3) of thin liquids when consecutive swallows were used. Penetration was eliminated with use of individual sips and no pharyngeal residue or instances of aspiration  were demontrated. It is recommended that a dysphagia 3 diet with thin liquids be initiated at this time with obervance of swallowing precautions. SLP will follow to ensure tolerance of the recommended diet and for potential upgrade to regular consistency. SLP Visit Diagnosis Dysphagia, oropharyngeal phase (R13.12) Attention and concentration deficit following -- Frontal lobe and executive function deficit following -- Impact on safety and function Mild aspiration risk   CHL IP TREATMENT RECOMMENDATION 05/13/2018 Treatment Recommendations Therapy as outlined in treatment plan below   Prognosis 05/13/2018 Prognosis for Safe Diet Advancement Good Barriers to Reach Goals Cognitive deficits Barriers/Prognosis Comment -- CHL IP DIET RECOMMENDATION 05/13/2018 SLP Diet Recommendations Dysphagia 3 (Mech soft) solids;Thin liquid Liquid Administration via Straw;Cup Medication Administration Whole meds with liquid Compensations Slow rate;Small sips/bites;Other (Comment) Postural Changes Seated upright at 90 degrees   CHL IP OTHER RECOMMENDATIONS 05/13/2018 Recommended Consults -- Oral Care Recommendations Oral care BID Other Recommendations --   CHL IP FOLLOW UP RECOMMENDATIONS  05/13/2018 Follow up Recommendations Inpatient Rehab   CHL IP FREQUENCY AND DURATION 05/13/2018 Speech Therapy Frequency (ACUTE ONLY) min 2x/week Treatment Duration 2 weeks      CHL IP ORAL PHASE 05/13/2018 Oral Phase Impaired Oral - Pudding Teaspoon -- Oral - Pudding Cup -- Oral - Honey Teaspoon -- Oral - Honey Cup -- Oral - Nectar Teaspoon -- Oral - Nectar Cup -- Oral - Nectar Straw -- Oral - Thin Teaspoon -- Oral - Thin Cup Lingual pumping;Premature spillage Oral - Thin Straw Lingual pumping;Premature spillage Oral - Puree Lingual pumping;Premature spillage Oral - Mech Soft Lingual pumping Oral - Regular Lingual pumping Oral - Multi-Consistency -- Oral - Pill Lingual pumping;Premature spillage Oral Phase - Comment --  CHL IP PHARYNGEAL PHASE 05/13/2018 Pharyngeal Phase Impaired Pharyngeal- Pudding Teaspoon -- Pharyngeal -- Pharyngeal- Pudding Cup -- Pharyngeal -- Pharyngeal- Honey Teaspoon -- Pharyngeal -- Pharyngeal- Honey Cup -- Pharyngeal -- Pharyngeal- Nectar Teaspoon -- Pharyngeal -- Pharyngeal- Nectar Cup -- Pharyngeal -- Pharyngeal- Nectar Straw -- Pharyngeal -- Pharyngeal- Thin Teaspoon -- Pharyngeal -- Pharyngeal- Thin Cup Delayed swallow initiation-vallecula;Reduced anterior laryngeal mobility Pharyngeal -- Pharyngeal- Thin Straw Delayed swallow initiation-vallecula;Reduced anterior laryngeal mobility;Penetration/Aspiration during swallow;Penetration/Aspiration before swallow Pharyngeal Material enters airway, remains ABOVE vocal cords and not ejected out Pharyngeal- Puree Delayed swallow initiation-vallecula;Reduced anterior laryngeal mobility Pharyngeal -- Pharyngeal- Mechanical Soft Delayed swallow initiation-vallecula;Reduced anterior laryngeal mobility Pharyngeal -- Pharyngeal- Regular Delayed swallow initiation-vallecula;Reduced anterior laryngeal mobility Pharyngeal -- Pharyngeal- Multi-consistency -- Pharyngeal -- Pharyngeal- Pill Delayed swallow initiation-vallecula;Reduced anterior laryngeal  mobility Pharyngeal -- Pharyngeal Comment --  CHL IP CERVICAL ESOPHAGEAL PHASE 05/13/2018 Cervical Esophageal Phase WFL Pudding Teaspoon -- Pudding Cup -- Honey Teaspoon -- Honey Cup -- Nectar Teaspoon -- Nectar Cup -- Nectar Straw -- Thin Teaspoon -- Thin Cup -- Thin Straw -- Puree -- Mechanical Soft -- Regular -- Multi-consistency -- Pill -- Cervical Esophageal Comment -- Shanika I. Hardin Negus, [place], [place] Office number [phone] Pager [phone] Horton Marshall 05/13/2018, 3:45 PM              [place] Ekg Site Rite  Result Date: 05/09/2018 If [facility] image not attached, placement could not be confirmed due to current cardiac rhythm.   Labs:  Basic Metabolic Panel: Recent Labs  Lab 05/20/18 0535 05/21/18 0513  NA 134* 135  K 4.5 4.3  CL 103 106  CO2 21* 21*  GLUCOSE 124* 127*  BUN 19 19  CREATININE 1.74* 1.68*  CALCIUM 9.2 9.1    CBC: No results for  input(s): WBC, NEUTROABS, HGB, HCT, MCV, PLT in the last 168 hours.  CBG: Recent Labs  Lab 05/23/18 1658 05/24/18 0626 05/24/18 1210 05/24/18 1650 05/24/18 2122  GLUCAP 114* 108* 77 75 144*   Family history. Mother with hypertension. Father with diabetes mellitus. Maternal aunt with breast cancer and diabetes as well as paternal aunt with diabetes. Denies any cancer  Brief HPI:    Rebecca Johnston is a 48 year old right-handed female history of diabetes mellitus maintained on Amer all, asthma who quit smoking 2 years ago,CKD stage III, GERD, morbid obesity BMI 43.4. Per chart review lives with her children ages 90 and 51. Independent prior to admission still working and drives. Has a mother in the area. Presented [facility] with headache, blurred vision and dizziness 1 week. Headache located right temporal area. Patient did note some slurred speech and facial droop as well as occasional nausea with episode of vomiting. Noted recently community-acquired pneumonia 04/29/2018 with  persistent cough nasal congestion she was placed on doxycycline by EDP. CT/MRI showed no acute stroke or mass effect. CT angiogram of head and neck negative. Urine drug screen negative. She had elevated glucose on admission of 355 bicarbonate low at 18 hyponatremic 127. She did receive Reglan and Compazine for nausea and later that day noted possible eye deviation as well as suspect seizure. She was loaded initially with Dilantin changed to Keppra and Lyrica. EEG showed 2 electrographic seizures emanating out of the right temporal region and spreading mostly to the entire right side. Her Keppra was changed to 1500 mg every 12 hours. Neurology felt seizures caused by severe hyperglycemia. Subcutaneous Lovenox added for DVT prophylaxis. Patient noted nothing by mouth initially with nasogastric tube feeds for nutritional support in her diet was slowly advanced. She did require intubation 05/09/2018 to 05/11/2018. Bouts of hypotension requiring nor epi early on in her hospital course. Therapy evaluations completed the patient was admitted for a comprehensive rehabilitation program.   Hospital Course: MAKAYLYN SINYARD was admitted to rehab 05/14/2018 for inpatient therapies to consist of PT, ST and OT at least three hours five days a week. Past admission physiatrist, therapy team and rehab RN have worked together to provide customized collaborative inpatient rehab.Pertaining to Ms. 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 Patient continued to be followed by neurology services.Her seizure medications medications were adjusted due to bouts of lethargy with Keppra reduced to 750 mg twice daily, Lyrica 100 mg 3 times a day. Complaints of right temporal headache which patient had before noted seizures possibly from vascular/migraines. Placed on Topamax increased to 50 mg twice daily as well as using Fioricet for break through headaches with good results.She continued to receive  followed by neuropsychology Her blood pressures remain well controlled. She did have a history of poorly controlled diabetes mellitus hemoglobin A1c of 14.9 aggressive diabetic teaching provided and blood sugars were greatly controlled on no current medication she had been on Amaryl in the past and patient would follow-up with her primary M.D. Her diet was advanced to regular consistency. Noted morbid obesity BMI 43.44 with dietary follow-up. CKD stage III BUN creatinine stabilized 19/1.68.  Physical exam. Blood pressure 123/84 pulse 96 temp 97.7 respirations 18  Oxygen saturation 96% room air Constitutional. No distress HEENT Head normocephalic/atraumatic Eyes. Pupils round and reactive to light no discharge EOMs normal no nystagmus Neck supple nontender normal range of motion of thyromegaly without bruit Cardiac. Normal rate and rhythm exam reveals no friction rub  no murmur heard Respiratory effort normal no respiratory distress without wheeze she has no rales GI. Soft exhibits no distention nontender Musculoskeletal. Normal range of motion Gen. Mild edema lower extremities palpable pedal pulses Neurological. Patient could tell the name of hospital that she was at that she had had a seizure. Provides the names of her children. Moves upper extremities 3-4 out of 5 proximal to distal lower extremities 3 out of 5 proximal to 4 out of 5 distally sensed pain in all fours. Language was normal speech was slightly slurred.  Rehab course: During patient's stay in rehab weekly team conferences were held to monitor patient's progress, set goals and discuss barriers to discharge. At admission, patient required minimal guard supine to sit, minimal assist stand pivot transfers. Minimal assistance for basic mobility ambulation to take a few steps. Minimum to moderate assist for basic ADLs with some encouragement to participate.  She  has had improvement in activity tolerance, balance, postural control as well as  ability to compensate for deficits. He/She has had improvement in functional use RUE/LUE  and RLE/LLE as well as improvement in awareness. Patient require supervision for transfers with use of rolling walker throughout sessions ambulating 200 feet rolling walker some verbal cues for safe environment navigation. Ascend and descend 12 stairs with handrail and supervision. Sit to stand from supine from flatbed at elevated height to simulate patient's bed height at home. Sidesteps left to right with rolling walker and supervision to simulate navigating narrow doorways and home environment. Ambulates up and down ramp and across uneven surfaces with rolling walker and supervision. Working with standing balance on ball toss. Romberg stance and modified tandem stance 230 seconds each. Patient ambulated throughout her room with supervision for ADLs. She gathered clothing items from low drawers and talus from overhead rack with close supervision. She Bates seated on tub bench intermittent supervision, standing supervision and use of grab bars to complete peri-care and hygiene grooming task completed standing at sink in order to increase functional activity tolerance. Speech therapy follow-up for facilitated complex problem-solving skills utilizing structured inferencing and planning tasks 3 requiring supervision. Full family teaching was completed and plan discharge to home       Disposition:  discharged to home   Diet: diabetic diet  Special Instructions: No driving, smoking or alcohol  Medications at time of discharge 1. Tylenol as needed 2. Fioricet 1 tablet every 6 hours as needed headache 3. Flonase 2 sprays each nostril daily 4. Keppra 750 mg by mouth twice a day 5. Lyrica 100 mg by mouth 3 times a day 6. Senokot-S 2 tablets bedtime 7. Topamax 50 mg by mouth twice a day  Discharge Instructions    Ambulatory referral to Neurology   Complete by:  As directed    An appointment is requested in  approximately 4 weeks seizure disorder      Follow-up Information    Meredith Staggers, MD Follow up.   Specialty:  Physical Medicine and Rehabilitation Why:  as directed Contact information: [address] [place] [address] [phone]        Ladell Pier, MD Follow up.   Specialty:  Internal Medicine Contact information: [place] [address] [phone]           Signed: Cathlyn Parsons 05/25/2018, 5:56 AM

## 2018-05-25 NOTE — Progress Notes (Signed)
Physical Therapy Discharge Summary  Patient Details  Name: Rebecca Johnston MRN: [id number] Date of Birth: 07-Mar-1970  Today's Date: 05/25/2018 PT Individual Time: 1330-1450 PT Individual Time Calculation (min): 80 min   Patient seated on recliner in room and agreeable to PT.  Patient supervision throughout session for mobility including car transfer when needed cues for technique and safety due to trying to put one foot in while standing, then practiced with correct technique sitting prior to putting feet in.  Patient demonstrating all other skills during session for discharge assessment as noted below.  Completed Berg balance assessment with score 41/56 demonstrating continued high fall risk.  Educated patient on safety and important steps for fall prevention with written instructions given for pt and family.  Also educated in plans for safe floor transfer if she should fall at home without injury and family available to assist, but otherwise to call 911.  Patient requesting to see SW for d/c and financial assist information as reports MD won't let her go back to work.  Discussed why she needs to hold off on back to work right now and she was in agreement.  Patient educated on HEP and issued for home to include: sit<>stand no UE support x 5, standing heel raises x 10 with UE support, hip abduction, hamstring curls all with UE support.  Sit to supine for bridging and abdominal curl ups with reach.  Handouts given and reviewed with patient.  Patient left seated in recliner in room with belt alarm and call bell in reach.   Patient has met 9 of 9 long term goals due to improved balance, improved postural control, ability to compensate for deficits and improved awareness.  Patient to discharge at an ambulatory level Supervision.   Patient's care partner unavailable to provide the necessary cognitive assistance at discharge.  Reasons goals not met: Patient met all goals and is moving with walker at  supervision level for safety due to cognitive deficits of decreased memory, safety and deficit awareness.  Issued HEP to patient this session with written safety instructions for pt and family.   Recommendation:  Patient will benefit from ongoing skilled PT services in home health setting to continue to advance safe functional mobility, address ongoing impairments in balance, coordination, safety and awareness, and minimize fall risk.  Equipment: rolling walker, tub bench  Reasons for discharge: treatment goals met and discharge from hospital  Patient/family agrees with progress made and goals achieved: Yes  PT Discharge Precautions/Restrictions Precautions Precautions: Fall Vital Signs Therapy Vitals Temp: 98.3 F (36.8 C) Pulse Rate: 80 Resp: 18 BP: 109/75 Patient Position (if appropriate): Sitting Oxygen Therapy SpO2: 99 % O2 Device: Room Air Pain Pain Assessment Pain Scale: 0-10 Pain Score: 8  Pain Type: Acute pain Pain Location: Head Pain Orientation: Right Pain Descriptors / Indicators: Headache Pain Onset: On-going Pain Intervention(s): RN made aware(delivered medicaiton during PT) Vision/Perception  Vision - Assessment Eye Alignment: Within Functional Limits Ocular Range of Motion: Within Functional Limits Perception Perception: Within Functional Limits Praxis Praxis: Intact  Cognition Overall Cognitive Status: Impaired/Different from baseline Arousal/Alertness: Awake/alert Orientation Level: Oriented X4 Attention: Sustained Sustained Attention: Appears intact Sustained Attention Impairment: Verbal complex;Functional complex Memory: Impaired Memory Impairment: Decreased recall of new information Awareness: Impaired Awareness Impairment: Anticipatory impairment Problem Solving: Impaired Problem Solving Impairment: Functional complex Executive Function: Writer: Impaired Organizing Impairment: Functional complex Safety/Judgment:  Impaired Comments: decreased awareness overall  Sensation Sensation Light Touch: Appears Intact Coordination Gross Motor Movements are Fluid and  Coordinated: Yes Fine Motor Movements are Fluid and Coordinated: Yes Coordination and Movement Description: Generalized weakness/ deconditioning Motor  Motor Motor: Within Functional Limits Motor - Discharge Observations: Generalized weakness/ deconditioning  Mobility Bed Mobility Rolling Right: Independent Rolling Left: Independent Supine to Sit: Independent Sit to Supine: Independent Transfers Transfers: Sit to Stand;Stand to Sit;Stand Pivot Transfers Sit to Stand: Supervision/Verbal cueing Stand to Sit: Supervision/Verbal cueing Stand Pivot Transfers: Supervision/Verbal cueing Transfer (Assistive device): None Locomotion  Gait Ambulation: Yes Gait Assistance: Supervision/Verbal cueing Gait Distance (Feet): 220 Feet Assistive device: Rolling walker Gait Assistance Details: S for safety, cues for keeping walker when coming into room environment Gait Gait Pattern: Impaired Gait Pattern: Wide base of support Stairs / Additional Locomotion Stairs: Yes Stairs Assistance: Supervision/Verbal cueing Stair Management Technique: One rail Right Number of Stairs: 12 Height of Stairs: 6 Ramp: Supervision/Verbal cueing Wheelchair Mobility Wheelchair Mobility: No  Trunk/Postural Assessment  Cervical Assessment Cervical Assessment: Exceptions to WFL(forward head) Thoracic Assessment Thoracic Assessment: Exceptions to WFL(rounded shoulders) Lumbar Assessment Lumbar Assessment: Exceptions to WFL(in standing anterior pelvic tilt) Postural Control Postural Control: Deficits on evaluation(delayed)  Balance Balance Balance Assessed: Yes Standardized Balance Assessment Standardized Balance Assessment: Berg Balance Test Berg Balance Test Sit to Stand: Able to stand without using hands and stabilize independently Standing Unsupported:  Able to stand safely 2 minutes Sitting with Back Unsupported but Feet Supported on Floor or Stool: Able to sit safely and securely 2 minutes Stand to Sit: Sits safely with minimal use of hands Transfers: Able to transfer safely, minor use of hands Standing Unsupported with Eyes Closed: Able to stand 10 seconds with supervision Standing Ubsupported with Feet Together: Able to place feet together independently and stand for 1 minute with supervision From Standing, Reach Forward with Outstretched Arm: Can reach forward >12 cm safely (5") From Standing Position, Pick up Object from Floor: Able to pick up shoe, needs supervision From Standing Position, Turn to Look Behind Over each Shoulder: Looks behind from both sides and weight shifts well Turn 360 Degrees: Needs assistance while turning Standing Unsupported, Alternately Place Feet on Step/Stool: Able to complete >2 steps/needs minimal assist Standing Unsupported, One Foot in Front: Able to take small step independently and hold 30 seconds Standing on One Leg: Able to lift leg independently and hold equal to or more than 3 seconds Total Score: 41 Dynamic Sitting Balance Dynamic Sitting - Level of Assistance: 6: Modified independent (Device/Increase time) Dynamic Standing Balance Dynamic Standing - Balance Support: No upper extremity supported;During functional activity Dynamic Standing - Level of Assistance: 5: Stand by assistance Dynamic Standing - Balance Activities: Forward lean/weight shifting;Other (comment) Dynamic Standing - Comments: picking item up from floor Extremity Assessment      RLE Assessment RLE Assessment: Within Functional Limits LLE Assessment LLE Assessment: Within Functional Limits    Reginia Naas  Magda Kiel, PT 05/25/2018, 4:25 PM

## 2018-05-25 NOTE — Consult Note (Signed)
Neuropsychological Consultation   Patient:   Rebecca Johnston   DOB:   May 29, 1970  MR Number:  [id number]  Location:  [facility] [id number] [place] [address] Dept: Foreman: [phone]           Date of Service:   05/25/2018  Start Time:   3 PM End Time:   4 PM  Provider/Observer:  Ilean Skill, Psy.D.       Clinical Neuropsychologist       Billing Code/Service: [phone]  Chief Complaint:    Rebecca Johnston is a 48 year old female with a history of diabetes, asthma, chronic kidney disease stage III, GERD, morbid obesity.  The patient presented on 05/05/2020 was long hospital with headache, blurred vision and dizziness over the prior week.  She experienced a headache located over the right temporal area.  The patient did have some slurred speech and facial droop as well as occasional nausea with episode of vomiting.  The patient had recently had community-acquired pneumonia on 04/29/2018 with persistent cough and nasal congestion and placed on doxycycline.  CT/MRI showed no acute stroke or mass-effect.  The patient had elevated glucose on admission of 355.  Later that day the patient had noted neck deviation with possible eye deviation as well as suspected seizure.  She was loaded initially with Dilantin and was changed to Keppra and Lyrica.  EEG showed 2 electrographic seizures emanating out of the right temporal region and spread mostly to the entire right side.  Her Keppra was increased.  Neurology felt the seizures were caused by severe hyperglycemia.  The patient did require intubation on 05/09/2018.  Bouts of hypotension requiring nor epi.  Once the patient was stabilized she was recommended for the physical medicine rehabilitation unit.  The patient has had times of significant lethargy and lack of motivation that are likely due to her current seizure medicine protocols.  However, there are  times when she was emotionally charged that her arousal levels and speech improved significantly.  There was a question about motivation initially.  Reason for Service:  The patient was referred for neuropsychological consultation due to diagnostic considerations as well as to work on coping and adjustment issues.  ALP:Rebecca Johnston E Charrette is a 48 year old right-handed female with history of diabetes mellitus maintained on Amaryl 2 mg daily, asthma quit smoking 2 years ago, CKDstage III,GERD, morbid obesity with BMI 43.4. Per chart review patient lives with her children ages 60 and 13(Pt told me 10 and 46). Independent prior to admission still working and drives.She has a mother in the area who can provide intermittent assistance.Presented 05/06/2018 to [facility] with headache, blurred vision and dizziness 1 week. Headache located right temporal area. Patient did note some slurred speech and facial droop as well as occasional nausea with episode of vomiting. Noted recently community-acquired pneumonia 04/29/2018 with persistent cough nasal congestion she was placed on doxycycline by EDP.Marland Kitchen CT/MRI showed no acute stroke or mass effect. CT angiogram of head and neck. Urine drug screen negative. She had elevated glucose on admission of 355 bicarbonate low at 18 hyponatremic 127. She did receive Reglan and Compazine for nausea and later that day noted neck deviation with possible eye deviation as well suspect seizure. She was loaded initially with Dilantin changed to Keppra and Lyrica. EEG showed 2 electrographic seizures emanating out of the right temporal region and spreading mostly to the entire right side. Her Keppra  was changed to 1500 mg every 12 hours.Neurology feels seizures caused by severe hyperglycemia.Subcutaneous Lovenox has been added for DVT prophylaxis. Patient nothing by mouth with nasogastric tube feeds for nutritional support followed by speech therapy untilfollow-up modified barium  swallow 05/13/2018 cleared her formechanical soft thin liquid diet initiated. Patient didrequireintubation 05/09/2018 05/11/2018. Bouts of hypotension requiring nor-epi. Therapy evaluations completed with recommendations of physical medicine rehabilitation consult. Patient was admitted for a comprehensive rehabilitation program.  Current Status:  The patient was oriented today but clearly drowsy.  Her drowsiness was especially significant when I first started talking to her.  However, over the next 50 minutes she became more alert and oriented.  The patient reported that she has had severe lethargy during the day.  She reports that it can take her a long time to get going and that when she is somnolent that she is not motivated to be active and she has to work hard to get going and does not always want to.    Behavioral Observation: Rebecca Johnston  presents as a 48 y.o.-year-old Right African American Female who appeared her stated age. her dress was Appropriate and she was Well Groomed and her manners were Appropriate to the situation.  her participation was indicative of Appropriate, Drowsy and Redirectable behaviors.  There were not any physical disabilities noted.  she displayed an appropriate level of cooperation and motivation.     Interactions:    Active Appropriate, Drowsy, Redirectable and but with significant improvement in arousal as the interaction progressed.    Attention:   abnormal and attention span appeared shorter than expected for age  Memory:   within normal limits; recent and remote memory intact  Visuo-spatial:  not examined  Speech (Volume):  low  Speech:   normal; initially very slow verbal response times.  Thought Process:  Coherent and Relevant  Though Content:  WNL; not suicidal and not homicidal  Orientation:   person, place, time/date and situation  Judgment:   Fair  Planning:   Poor  Affect:    Blunted and  Lethargic  Mood:    Dysphoric  Insight:   Fair  Intelligence:   normal  Medical History:   Past Medical History:  Diagnosis Date  . Anemia   . Asthma   . Diabetes mellitus without complication ([place]) 83/1517  . Fibroids   . GERD (gastroesophageal reflux disease)   . History of blood transfusion Feb 2015  . Obesity, Class III, BMI 40-49.9 (morbid obesity) ([place])   . Shortness of breath dyspnea      Psychiatric History:  Patient denies significant past psych history.  Family Med/Psych History:  Family History  Problem Relation Age of Onset  . Hypertension Mother   . Diabetes Father   . Breast cancer Maternal Aunt   . Diabetes Maternal Aunt   . Diabetes Paternal Aunt   . Diabetes Maternal Aunt   . Diabetes Maternal Aunt     Risk of Suicide/Violence: low Patient denies SI or HI.  Impression/DX:  NEFTALY INZUNZA is a 48 year old female with a history of diabetes, asthma, chronic kidney disease stage III, GERD, morbid obesity.  The patient presented on 05/05/2020 was long hospital with headache, blurred vision and dizziness over the prior week.  She experienced a headache located over the right temporal area.  The patient did have some slurred speech and facial droop as well as occasional nausea with episode of vomiting.  The patient had recently had community-acquired pneumonia on 04/29/2018  with persistent cough and nasal congestion and placed on doxycycline.  CT/MRI showed no acute stroke or mass-effect.  The patient had elevated glucose on admission of 355.  Later that day the patient had noted neck deviation with possible eye deviation as well as suspected seizure.  She was loaded initially with Dilantin and was changed to Keppra and Lyrica.  EEG showed 2 electrographic seizures emanating out of the right temporal region and spread mostly to the entire right side.  Her Keppra was increased.  Neurology felt the seizures were caused by severe hyperglycemia.  The patient did require  intubation on 05/09/2018.  Bouts of hypotension requiring nor epi.  Once the patient was stabilized she was recommended for the physical medicine rehabilitation unit.  The patient has had times of significant lethargy and lack of motivation that are likely due to her current seizure medicine protocols.  However, there are times when she was emotionally charged that her arousal levels and speech improved significantly.  There was a question about motivation initially.  The patient was oriented today but clearly drowsy.  Her drowsiness was especially significant when I first started talking to her.  However, over the next 50 minutes she became more alert and oriented.  The patient reported that she has had severe lethargy during the day.  She reports that it can take her a long time to get going and that when she is somnolent that she is not motivated to be active and she has to work hard to get going and does not always want to.    I do not think the patient is purposefully malingering but rather the anti seizure medications are producing a great deal of lethargy.  The patient improves arousal and effort with time and activity.  She does acknowledge that when she is feel so tired and listless that she will not have any motivation to do things.  When emotional situations arouse they patients mood and engagement significantly improve.  The patient should try to be as active during day as much as possible while taking current dose of Keppra.      Diagnosis:    Seizure disorder [facility]) - Plan: Ambulatory referral to Neurology         Electronically Signed   _______________________ Ilean Skill, Psy.D.

## 2018-05-25 NOTE — Progress Notes (Signed)
Speech Language Pathology Discharge Summary  Patient Details  Name: Rebecca Johnston MRN: [id number] Date of Birth: 1970/08/01  Today's Date: 05/25/2018 SLP Individual Time: 1000-1100 SLP Individual Time Calculation (min): 60 min   Skilled Therapeutic Interventions:  Pt was seen for skilled ST targeting cognitive goals.  SLP administered the Cognistat to measure progress from initial evaluation.  Pt scored borderline impaired in repetition and naming subtests but was in the range of mildly impaired for attention subtest.  Pt appeared much more lethargic and slow to process today in comparison to previous therapy sessions; however, suspect this is more related to timing of medication administration with therapy.  SLP discussed cognitive hierarchy and attention's impact on all higher level cognitive processes when impaired.  SLP offered suggestions to maximize safety in the home environment, specifically emphasizing the need for assistance with medication and financial management until side effects from anti seizure and pain meds are mitigated.  Pt reports she will most likely not have 24/7 supervision at home but rather only intermittent supervision from her mother and son.  Pt is at goal level and is ready to discharge tomorrow.      Patient has met 5 of 5 long term goals.  Patient to discharge at [facility] level.  Reasons goals not met:     Clinical Impression/Discharge Summary:   Pt has made slow, inconsistent gains while inpatient and is discharging having met 5 out of 5 long term goals.  Pt is currently supervision for tasks due to mild cognitive deficits that are likely secondary to medication effects.  Pt education is complete at this time.  Pt has demonstrated improved recall of daily information and attention to tasks.  Pt is discharging home with recommendations for 24/7 supervision and ST follow up at next level of care.    Care Partner:  Caregiver Able to Provide Assistance: Other  (comment)(no family present to verify ability to support, pt reports most likely intermittent supervision )  Type of Caregiver Assistance: Cognitive(intermittently)  Recommendation:  Outpatient SLP;Home Health SLP;24 hour supervision/assistance  Rationale for SLP Follow Up: Maximize cognitive function and independence;Reduce caregiver burden   Equipment: none recommended by SLP    Reasons for discharge: Discharged from hospital   Patient/Family Agrees with Progress Made and Goals Achieved: Yes    [name], Selinda Orion 05/25/2018, 12:51 PM

## 2018-05-26 ENCOUNTER — Telehealth: Payer: Self-pay | Admitting: Internal Medicine

## 2018-05-26 LAB — GLUCOSE, CAPILLARY: Glucose-Capillary: 99 mg/dL (ref 70–99)

## 2018-05-26 MED ORDER — BUTALBITAL-APAP-CAFFEINE 50-325-40 MG PO TABS: 1.0000 | ORAL_TABLET | Freq: Four times a day (QID) | ORAL | 0 refills | Status: DC | PRN

## 2018-05-26 MED ORDER — LEVETIRACETAM 750 MG PO TABS: 750.0000 mg | ORAL_TABLET | Freq: Two times a day (BID) | ORAL | 1 refills | Status: DC

## 2018-05-26 MED ORDER — PROAIR HFA 108 (90 BASE) MCG/ACT IN AERS: 2.0000 | INHALATION_SPRAY | Freq: Four times a day (QID) | RESPIRATORY_TRACT | 3 refills | Status: DC | PRN

## 2018-05-26 MED ORDER — PREGABALIN 100 MG PO CAPS: 100.0000 mg | ORAL_CAPSULE | Freq: Three times a day (TID) | ORAL | 0 refills | Status: DC

## 2018-05-26 MED ORDER — TOPIRAMATE 50 MG PO TABS: 50.0000 mg | ORAL_TABLET | Freq: Two times a day (BID) | ORAL | 1 refills | Status: DC

## 2018-05-26 MED ORDER — BLOOD GLUCOSE METER KIT: PACK | 0 refills | Status: AC

## 2018-05-26 MED ORDER — ACETAMINOPHEN 325 MG PO TABS: 650.0000 mg | ORAL_TABLET | Freq: Four times a day (QID) | ORAL | Status: DC | PRN

## 2018-05-26 NOTE — Progress Notes (Addendum)
Social Work  Discharge Note  The overall goal for the admission was met for:   Discharge location: Yes - home with teenaged children.  Length of Stay: Yes - 12 days  Discharge activity level: Yes - intermittent supervision  Home/community participation: Yes  Services provided included: MD, RD, PT, OT, SLP, RN, TR, Pharmacy, Neuropsych and SW  Financial Services: Medicaid  Follow-up services arranged: Home Health: PT, OT, ST via [place], DME: wide rolling walker, tub bench via [place] and Patient/Family has no preference for HH/DME agencies  Comments (or additional information):  Contact info:  Pt at [phone]    Mother, Giavanni Zeitlin @ [phone]  Patient/Family verbalized understanding of follow-up arrangements: Yes  Individual responsible for coordination of the follow-up plan: pt  Confirmed correct DME delivered: Lennart Pall 05/26/2018    Bennye Nix, Lorre Nick

## 2018-05-26 NOTE — Progress Notes (Signed)
[facility] PHYSICAL MEDICINE & REHABILITATION PROGRESS NOTE   Subjective/Complaints: Pt feels fairly well. Headache present but improved. Feels that she's ready to go home  ROS: Patient denies fever, rash, sore throat, blurred vision, nausea, vomiting, diarrhea, cough, shortness of breath or chest pain, joint or back pain  or mood change.    Objective:   No results found. Recent Labs    05/25/18 0518  WBC 6.7  HGB 9.9*  HCT 33.8*  PLT 278   Recent Labs    05/25/18 0518  NA 138  K 4.4  CL 105  CO2 20*  GLUCOSE 113*  BUN 19  CREATININE 1.76*  CALCIUM 9.3    Intake/Output Summary (Last 24 hours) at 05/26/2018 0911 Last data filed at 05/26/2018 0831 Gross per 24 hour  Intake 840 ml  Output -  Net 840 ml     Physical Exam: Vital Signs Blood pressure 93/61, pulse 70, temperature 97.6 F (36.4 C), resp. rate 17, height 5\' 5"  (1.651 m), weight 119.3 kg, last menstrual period 11/26/2014, SpO2 99 %. Constitutional: No distress . Vital signs reviewed. obese HEENT: EOMI, oral membranes moist Neck: supple Cardiovascular: RRR without murmur. No JVD    Respiratory: CTA Bilaterally without wheezes or rales. Normal effort    GI: BS +, non-tender, non-distended  Musculoskeletal:Normal range of motion. no focal tenderness General: Edema(BLE)present. No tendernessor deformity.  Neurological: Improved speech. Initiates conversation easily.     Moves UE 4/5 prox to distal. LE: 4/5 prox to 4/5 distally. Sensed pain in all 4's. CN remains non-focal   Skin: Skin isdry. She isnot diaphoretic.  Psychiatric:pleasant    Assessment/Plan: 1. Functional deficits secondary to status epilepticus/encephalopathy which require 3+ hours per day of interdisciplinary therapy in a comprehensive inpatient rehab setting.  Physiatrist is providing close team supervision and 24 hour management of active medical problems listed below.  Physiatrist and rehab team continue to assess  barriers to discharge/monitor patient progress toward functional and medical goals  Care Tool:  Bathing    Body parts bathed by patient: Right arm, Left arm, Chest, Abdomen, Front perineal area, Buttocks, Right upper leg, Left upper leg, Right lower leg, Left lower leg, Face         Bathing assist Assist Level: Set up assist     Upper Body Dressing/Undressing Upper body dressing   What is the patient wearing?: Bra, Pull over shirt    Upper body assist Assist Level: Independent    Lower Body Dressing/Undressing Lower body dressing      What is the patient wearing?: Underwear/pull up, Pants     Lower body assist Assist for lower body dressing: Independent with assitive device Assistive Device Comment: Standing with RW   Toileting Toileting Toileting Activity did not occur (Clothing management and hygiene only): N/A (no void or bm)  Toileting assist Assist for toileting: Independent with assistive device Assistive Device Comment: RW   Transfers Chair/bed transfer  Transfers assist     Chair/bed transfer assist level: Independent with assistive device     Locomotion Ambulation   Ambulation assist      Assist level: Supervision/Verbal cueing Assistive device: Walker-rolling Max distance: 200'   Walk 10 feet activity   Assist  Walk 10 feet activity did not occur: Safety/medical concerns  Assist level: Supervision/Verbal cueing Assistive device: Walker-rolling   Walk 50 feet activity   Assist Walk 50 feet with 2 turns activity did not occur: Safety/medical concerns  Assist level: Supervision/Verbal cueing Assistive device: Walker-rolling  Walk 150 feet activity   Assist Walk 150 feet activity did not occur: Safety/medical concerns  Assist level: Supervision/Verbal cueing Assistive device: Walker-rolling    Walk 10 feet on uneven surface  activity   Assist Walk 10 feet on uneven surfaces activity did not occur: Safety/medical  concerns         Wheelchair     Assist Will patient use wheelchair at discharge?: No   Wheelchair activity did not occur: N/A         Wheelchair 50 feet with 2 turns activity    Assist    Wheelchair 50 feet with 2 turns activity did not occur: N/A       Wheelchair 150 feet activity     Assist Wheelchair 150 feet activity did not occur: N/A        Medical Problem List and Plan: 1.Decreased functional mobility with headache dizzinesssecondary to refractory focal status epilepticus possibly precipitated by severe hyperglycemia.Additionally debilitated from multiple medical issues and prolonged hospital stay --dc home today  -outpt neurology follow up re: seizures, ongoing headaches -HH follow up 2. Antithrombotics: -DVT/anticoagulation:ambulating, dc lovenox  -antiplatelet therapy: none 3. Pain Management:c/o of right temporal headache (which was BEFORE the seizures). Could be some form of vascular/migraine    -continue topamax 50 mg BID (3/19)     -PRN fioricet for breakthrough headache 4. Mood:provide emotional support  -antipsychotic agents: none  -appreciate Dr. Ferne Coe input. Didn't see any signs of malingering 5. Neuropsych: This patientiscapable of making decisions on herown behalf. 6. Skin/Wound Care:Routine skin checks 7. Fluids/Electrolytes/Nutrition:   -encourage PO intake   -dietary consult  -hyponatremia --134 3/19 8. Seizures/status epilepticus potentially due to hyperglycemia:  -appreciate neuro follow up, they have signed off  - keppra reduced to 750mg  bid   -reduced Lyrica 100 mg 3 times a day  -dose reductions have helped mentation  -Pt off phenobarbital and dilantin -no further seizure activity witnessed   9.Diabetes mellitus with peripheral neuropathy. Hemoglobin A1c 14.9.    -sugars controlled off meds which is quite amazing given her  Hgb A1C, likely sheds some light on her habits PTA.  -continue CM diet and dietary ed 10. Dysphagia. Diet advanced to regular. 11. Asthma with remote history of tobacco abuse. Continue nebulizers 12. Morbid obesity. BMI 43.44. Dietary follow-up 13.CKD stage III.  BUN/Cr holding at 19/1.76   LOS: 12 days A FACE TO FACE EVALUATION WAS PERFORMED  Meredith Staggers 05/26/2018, 9:11 AM

## 2018-05-26 NOTE — Patient Care Conference (Signed)
Inpatient RehabilitationTeam Conference and Plan of Care Update Date: 05/25/2018   Time: 2:10 PM    Patient Name: Rebecca Johnston      Medical Record Number: [id number]  Date of Birth: 1970/03/08 Sex: Female         Room/Bed: 4W10C/4W10C-01 Payor Info: Payor: MEDICAID [place] / Plan: MEDICAID [id number] ACCESS / Product Type: *No Product type* /    Admitting Diagnosis: ha  Admit Date/Time:  05/14/2018  5:38 PM Admission Comments: No comment available   Primary Diagnosis:  <principal problem not specified> Principal Problem: <principal problem not specified>  Patient Active Problem List   Diagnosis Date Noted  . Seizure disorder ([place]) 05/14/2018  . Status epilepticus ([place])   . Headache 05/07/2018  . Lobar pneumonia ([place]) 05/07/2018  . GERD (gastroesophageal reflux disease) 05/07/2018  . Hyponatremia 05/07/2018  . HA (headache) 05/07/2018  . Hyperglycemia   . CKD (chronic kidney disease), stage III ([place]) 04/16/2017  . Neuropathy 04/04/2016  . Hx of iron deficiency anemia 12/21/2015  . Seasonal allergic rhinitis 12/21/2015  . Type II diabetes mellitus with renal manifestations ([place]) 12/21/2015  . Incarcerated hernia s/p lap repair w mesh 12/08/2014 12/08/2014  . Esophageal reflux 10/21/2013  . Abnormal CT scan, chest 09/02/2013    Expected Discharge Date: Expected Discharge Date: 05/26/18  Team Members Present: Physician leading conference: Dr. Alger Simons Social Worker Present: Lennart Pall, LCSW Nurse Present: Leonette Nutting, RN PT Present: Other (comment)(Taylor Ervin Knack, PT) OT Present: Amy Rounds, OT SLP Present: Windell Moulding, SLP PPS Coordinator present : Gunnar Fusi     Current Status/Progress Goal Weekly Team Focus  Medical   improving neurologically and from a headache standpoint. Have neuropsych for assessment of coping and possibilty of secondary gain  improve health hygiene and pain  see above   Bowel/Bladder   Continent of bowel and bladder LBM 05/24/2018  Remain  continent of bowel and bladder with minimal assistance  Assess bowel abd bladder needs q shift and PRn and offer toileting q 2 hour while awake   Swallow/Nutrition/ Hydration             ADL's   Supervision overall using RW  Supervision with all BADLs and mobility  Goals met. Pt to d/c home today   Mobility   S overall with transfers and mobility with RW, gait up to 200'  Supervision overall  balance, safety awareness, d/c planning   Communication   min assist-supervision, fluctuates with meds and fatigue   min assist   education complete, pt is ready for discharge tomorrow.    Safety/Cognition/ Behavioral Observations  min assist, fluctuates with meds and fatigue  min assit   education complete, pt is ready for dishcarge tomorrow    Pain   Pt has headaches that come and go, topimax dosage increased to help with headaches   Pain </=3/10  Assess pain q shift and PRN and medicate when necessary    Skin   MASD to breast and groin, dry skin  Remain free from skin breakdown   Assess skin every shift and prn      *See Care Plan and progress notes for long and short-term goals.     Barriers to Discharge  Current Status/Progress Possible Resolutions Date Resolved   Physician    Decreased caregiver support        see medical problems list      Nursing  PT                    OT                  SLP                SW                Discharge Planning/Teaching Needs:  Pt to d/c home with 29 and 78 yo children in the home.      Team Discussion:  Still with somatic c/o. Supervision overall and therapies feel ready for d/c tomorrow with intermittent supervision in the home.  Still needing oversight with home management.  Start with hh follow up.  Revisions to Treatment Plan:  NA    Continued Need for Acute Rehabilitation Level of Care: The patient requires daily medical management by a physician with specialized training in physical medicine and rehabilitation for  the following conditions: Daily direction of a multidisciplinary physical rehabilitation program to ensure safe treatment while eliciting the highest outcome that is of practical value to the patient.: Yes Daily medical management of patient stability for increased activity during participation in an intensive rehabilitation regime.: Yes Daily analysis of laboratory values and/or radiology reports with any subsequent need for medication adjustment of medical intervention for : Neurological problems;Diabetes problems   I attest that I was present, lead the team conference, and concur with the assessment and plan of the team.   Lindyn Vossler 05/26/2018, 4:24 PM

## 2018-05-26 NOTE — Discharge Instructions (Signed)
Inpatient Rehab Discharge Instructions  ZABRINA BROTHERTON Discharge date and time: No discharge date for patient encounter.   Activities/Precautions/ Functional Status: Activity: activity as tolerated Diet: diabetic diet Wound Care: none needed Functional status:  ___ No restrictions     ___ Walk up steps independently ___ 24/7 supervision/assistance   ___ Walk up steps with assistance ___ Intermittent supervision/assistance  ___ Bathe/dress independently ___ Walk with walker     __x_ Bathe/dress with assistance ___ Walk Independently    ___ Shower independently ___ Walk with assistance    ___ Shower with assistance ___ No alcohol     ___ Return to work/school ________    COMMUNITY REFERRALS UPON DISCHARGE:    Home Health:   PT     OT     ST                     Agency:  [facility] Phone: [phone]   Medical Equipment/Items Ordered: rolling walker, tub bench                                                     Agency/Supplier:  [facility] @ [phone]       Special Instructions:  No driving  My questions have been answered and I understand these instructions. I will adhere to these goals and the provided educational materials after my discharge from the hospital.  Patient/Caregiver Signature _______________________________ Date __________  Clinician Signature _______________________________________ Date __________  Please bring this form and your medication list with you to all your follow-up doctor's appointments.

## 2018-05-26 NOTE — Progress Notes (Signed)
Patient discharged home with family. No questions at this time, all possessions accounted for.

## 2018-05-26 NOTE — Telephone Encounter (Signed)
1) Medication(s) Requested (by name): pregabalin (LYRICA) 100 MG capsule  2) Pharmacy of Choice: [facility], [place] 3) Special Requests: Pt called In stated she nseds her lyrica authorized before she could collect it please follow up    Approved medications will be sent to the pharmacy, we will reach out if there is an issue.  Requests made after 3pm may not be addressed until the following business day!  If a patient is unsure of the name of the medication(s) please note and ask patient to call back when they are able to provide all info, do not send to responsible party until all information is available!

## 2018-05-27 DIAGNOSIS — N183 Chronic kidney disease, stage 3 (moderate): Secondary | ICD-10-CM | POA: Diagnosis not present

## 2018-05-27 DIAGNOSIS — J45909 Unspecified asthma, uncomplicated: Secondary | ICD-10-CM | POA: Diagnosis not present

## 2018-05-27 DIAGNOSIS — E1122 Type 2 diabetes mellitus with diabetic chronic kidney disease: Secondary | ICD-10-CM | POA: Diagnosis not present

## 2018-05-27 DIAGNOSIS — G40111 Localization-related (focal) (partial) symptomatic epilepsy and epileptic syndromes with simple partial seizures, intractable, with status epilepticus: Secondary | ICD-10-CM | POA: Diagnosis not present

## 2018-05-27 DIAGNOSIS — Z6841 Body Mass Index (BMI) 40.0 and over, adult: Secondary | ICD-10-CM | POA: Diagnosis not present

## 2018-05-27 DIAGNOSIS — Z7951 Long term (current) use of inhaled steroids: Secondary | ICD-10-CM | POA: Diagnosis not present

## 2018-05-27 DIAGNOSIS — Z87891 Personal history of nicotine dependence: Secondary | ICD-10-CM | POA: Diagnosis not present

## 2018-05-27 DIAGNOSIS — E1142 Type 2 diabetes mellitus with diabetic polyneuropathy: Secondary | ICD-10-CM | POA: Diagnosis not present

## 2018-05-27 DIAGNOSIS — G9340 Encephalopathy, unspecified: Secondary | ICD-10-CM | POA: Diagnosis not present

## 2018-05-27 DIAGNOSIS — K219 Gastro-esophageal reflux disease without esophagitis: Secondary | ICD-10-CM | POA: Diagnosis not present

## 2018-05-27 DIAGNOSIS — E1165 Type 2 diabetes mellitus with hyperglycemia: Secondary | ICD-10-CM | POA: Diagnosis not present

## 2018-05-27 DIAGNOSIS — D631 Anemia in chronic kidney disease: Secondary | ICD-10-CM | POA: Diagnosis not present

## 2018-05-28 DIAGNOSIS — E1142 Type 2 diabetes mellitus with diabetic polyneuropathy: Secondary | ICD-10-CM | POA: Diagnosis not present

## 2018-05-28 DIAGNOSIS — E1165 Type 2 diabetes mellitus with hyperglycemia: Secondary | ICD-10-CM | POA: Diagnosis not present

## 2018-05-28 DIAGNOSIS — Z7951 Long term (current) use of inhaled steroids: Secondary | ICD-10-CM | POA: Diagnosis not present

## 2018-05-28 DIAGNOSIS — G9340 Encephalopathy, unspecified: Secondary | ICD-10-CM | POA: Diagnosis not present

## 2018-05-28 DIAGNOSIS — Z87891 Personal history of nicotine dependence: Secondary | ICD-10-CM | POA: Diagnosis not present

## 2018-05-28 DIAGNOSIS — K219 Gastro-esophageal reflux disease without esophagitis: Secondary | ICD-10-CM | POA: Diagnosis not present

## 2018-05-28 DIAGNOSIS — D631 Anemia in chronic kidney disease: Secondary | ICD-10-CM | POA: Diagnosis not present

## 2018-05-28 DIAGNOSIS — G40111 Localization-related (focal) (partial) symptomatic epilepsy and epileptic syndromes with simple partial seizures, intractable, with status epilepticus: Secondary | ICD-10-CM | POA: Diagnosis not present

## 2018-05-28 DIAGNOSIS — Z6841 Body Mass Index (BMI) 40.0 and over, adult: Secondary | ICD-10-CM | POA: Diagnosis not present

## 2018-05-28 DIAGNOSIS — J45909 Unspecified asthma, uncomplicated: Secondary | ICD-10-CM | POA: Diagnosis not present

## 2018-05-28 DIAGNOSIS — N183 Chronic kidney disease, stage 3 (moderate): Secondary | ICD-10-CM | POA: Diagnosis not present

## 2018-05-28 DIAGNOSIS — E1122 Type 2 diabetes mellitus with diabetic chronic kidney disease: Secondary | ICD-10-CM | POA: Diagnosis not present

## 2018-06-01 ENCOUNTER — Ambulatory Visit: Payer: Medicaid Other | Attending: Family Medicine | Admitting: Family Medicine

## 2018-06-01 ENCOUNTER — Encounter: Payer: Self-pay | Admitting: Family Medicine

## 2018-06-01 ENCOUNTER — Other Ambulatory Visit: Payer: Self-pay

## 2018-06-01 DIAGNOSIS — E1122 Type 2 diabetes mellitus with diabetic chronic kidney disease: Secondary | ICD-10-CM

## 2018-06-01 DIAGNOSIS — R569 Unspecified convulsions: Secondary | ICD-10-CM | POA: Diagnosis not present

## 2018-06-01 DIAGNOSIS — N183 Chronic kidney disease, stage 3 unspecified: Secondary | ICD-10-CM

## 2018-06-01 MED ORDER — TOPIRAMATE 50 MG PO TABS: 50.0000 mg | ORAL_TABLET | Freq: Two times a day (BID) | ORAL | 3 refills | Status: DC

## 2018-06-01 MED ORDER — LEVETIRACETAM 750 MG PO TABS: 750.0000 mg | ORAL_TABLET | Freq: Two times a day (BID) | ORAL | 3 refills | Status: DC

## 2018-06-01 NOTE — Telephone Encounter (Signed)
I have not received a PA for this patient because the prescription came from Dr. Donia Guiles who would be responsible for processing the PA.

## 2018-06-01 NOTE — Progress Notes (Signed)
Virtual Visit via Telephone Note  I connected with Rebecca Johnston on 06/01/18 at  8:50 AM EDT by telephone and verified that I am speaking with the correct person using two identifiers.   I discussed the limitations, risks, security and privacy concerns of performing an evaluation and management service by telephone and the availability of in person appointments. I also discussed with the patient that there may be a patient responsible charge related to this service. The patient expressed understanding and agreed to proceed.   History of Present Illness: 48 year old female with a history of type 2 diabetes mellitus (A1c 14.9 in 05/2018), stage III CKD, obesity seen via telemedicine for hospital follow-up visit. She was hospitalized at [facility] from 05/06/2018 through 05/26/2018 after she had presented to [facility] with dizziness, photophobia and dystonic head movements, confusion.  EEG revealed partial seizures.  Seen by neurology and antiepileptics were commenced.  CT head was normal. She subsequently developed respiratory failure secondary to status epilepticus which required intubation. Continuous EEG monitoring revealed right temporal seizures.  After loading dose of antiepileptics she was transitioned to Topamax, Keppra, Lyrica. Seizures and ?Todd's palsy were thought to be secondary to hyperglycemia as A1c was 14, CBG on admission was 366 but during hospitalization but CBGs during hospitalization were normal.  Respiratory status improved and she was extubated. She had dysphagia which was thought to be secondary to intubation and so was subsequently seen by speech and language pathology and later transferred to comprehensive inpatient rehab.  Today she denies weakness of any body parts, speech abnormality but does have some residual dysphagia.  Her blood sugars at home have been normal and have ranged between 106 and 113.  She was previously on glimepiride from her PCP and reports  hypoglycemia with that; not on any medications at the moment. Currently receiving OT, PT at home and is wondering when she can return to work. Of note she has not had any seizures since discharge.  Concerned she was unable to get Lyrica as informed that her insurance company would not cover it and she needed authorization from the doctor. Her appointment with neurology is not until 08/2018.   Observations/Objective: AAOX3 NAD  CMP Latest Ref Rng & Units 05/25/2018 05/21/2018 05/20/2018  Glucose 70 - 99 mg/dL 113(H) 127(H) 124(H)  BUN 6 - 20 mg/dL 19 19 19   Creatinine 0.44 - 1.00 mg/dL 1.76(H) 1.68(H) 1.74(H)  Sodium 135 - 145 mmol/L 138 135 134(L)  Potassium 3.5 - 5.1 mmol/L 4.4 4.3 4.5  Chloride 98 - 111 mmol/L 105 106 103  CO2 22 - 32 mmol/L 20(L) 21(L) 21(L)  Calcium 8.9 - 10.3 mg/dL 9.3 9.1 9.2  Total Protein 6.5 - 8.1 g/dL - - -  Total Bilirubin 0.3 - 1.2 mg/dL - - -  Alkaline Phos 38 - 126 U/L - - -  AST 15 - 41 U/L - - -  ALT 0 - 44 U/L - - -    CBC    Component Value Date/Time   WBC 6.7 05/25/2018 0518   RBC 4.58 05/25/2018 0518   HGB 9.9 (L) 05/25/2018 0518   HGB 12.6 04/16/2017 1501   HCT 33.8 (L) 05/25/2018 0518   HCT 38.5 04/16/2017 1501   PLT 278 05/25/2018 0518   PLT 352 04/16/2017 1501   MCV 73.8 (L) 05/25/2018 0518   MCV 67 (L) 04/16/2017 1501   MCH 21.6 (L) 05/25/2018 0518   MCHC 29.3 (L) 05/25/2018 0518   RDW 17.9 (H)  05/25/2018 0518   RDW 17.0 (H) 04/16/2017 1501   LYMPHSABS 1.4 05/17/2018 0640   MONOABS 0.7 05/17/2018 0640   EOSABS 0.2 05/17/2018 0640   BASOSABS 0.0 05/17/2018 0640    Assessment and Plan: 1. Type 2 diabetes mellitus with stage 3 chronic kidney disease, without long-term current use of insulin (HCC) Uncontrolled with A1c of 14.9 which trended up from 6.2 surprisingly Currently not on medications due to hypoglycemia experienced during hospitalization Reported blood sugar logs have been in the normal glycemic range-holding off on  sulfonylurea to prevent hypoglycemia and unable to use metformin due to CKD Would love to place on NovoLog sliding scale in the event that she does experience hyperglycemia however she has never used injectables before. We will bring back for PCP visit to review blood sugar log and determine if lifestyle modification will be beneficial or insulin teaching as indicated   2. CKD (chronic kidney disease), stage III (HCC) CKD stage III Likely secondary to diabetic nephropathy Avoid nephrotoxins  3. Seizure [facility]) Seizure was thought to be secondary to hyperglycemia during hospitalization She has been unable to obtain Lyrica from her insurance company even though she was on it during hospitalization No seizures since discharge Continue Topamax, Keppra. Continue home PT/OT - topiramate (TOPAMAX) 50 MG tablet; Take 1 tablet (50 mg total) by mouth 2 (two) times daily.  Dispense: 60 tablet; Refill: 3 - levETIRAcetam (KEPPRA) 750 MG tablet; Take 1 tablet (750 mg total) by mouth 2 (two) times daily.  Dispense: 60 tablet; Refill: 3   Follow Up Instructions:    I discussed the assessment and treatment plan with the patient. The patient was provided an opportunity to ask questions and all were answered. The patient agreed with the plan and demonstrated an understanding of the instructions.   The patient was advised to call back or seek an in-person evaluation if the symptoms worsen or if the condition fails to improve as anticipated.  I provided 16 minutes of non-face-to-face time during this encounter.   Charlott Rakes, MD

## 2018-06-01 NOTE — Progress Notes (Signed)
Patient's DOB has been verified. Patient has been screened and transferred to PCP.  Refills: None Needed.

## 2018-06-03 ENCOUNTER — Telehealth: Payer: Self-pay | Admitting: *Deleted

## 2018-06-03 NOTE — Telephone Encounter (Signed)
Merry Proud PT called for POC 1 wk 4 for gait strength and balance.  Approval given.

## 2018-06-10 ENCOUNTER — Telehealth: Payer: Self-pay

## 2018-06-10 NOTE — Telephone Encounter (Signed)
Nick,PTA from [facility] called stating he called patient to schedule therapy and patient declined services.

## 2018-06-11 ENCOUNTER — Other Ambulatory Visit: Payer: Self-pay | Admitting: Internal Medicine

## 2018-06-11 DIAGNOSIS — R7309 Other abnormal glucose: Secondary | ICD-10-CM

## 2018-06-15 ENCOUNTER — Other Ambulatory Visit: Payer: Self-pay

## 2018-06-15 ENCOUNTER — Ambulatory Visit: Payer: Medicaid Other | Attending: Internal Medicine | Admitting: Internal Medicine

## 2018-06-15 ENCOUNTER — Encounter: Payer: Self-pay | Admitting: Internal Medicine

## 2018-06-15 VITALS — BP 109/76 | HR 75 | Temp 98.1°F | Resp 20 | Ht 65.0 in | Wt 259.4 lb

## 2018-06-15 DIAGNOSIS — Z6841 Body Mass Index (BMI) 40.0 and over, adult: Secondary | ICD-10-CM

## 2018-06-15 DIAGNOSIS — D509 Iron deficiency anemia, unspecified: Secondary | ICD-10-CM

## 2018-06-15 DIAGNOSIS — K219 Gastro-esophageal reflux disease without esophagitis: Secondary | ICD-10-CM | POA: Diagnosis not present

## 2018-06-15 DIAGNOSIS — R569 Unspecified convulsions: Secondary | ICD-10-CM

## 2018-06-15 DIAGNOSIS — Z87891 Personal history of nicotine dependence: Secondary | ICD-10-CM | POA: Insufficient documentation

## 2018-06-15 DIAGNOSIS — Z79899 Other long term (current) drug therapy: Secondary | ICD-10-CM | POA: Insufficient documentation

## 2018-06-15 DIAGNOSIS — E1165 Type 2 diabetes mellitus with hyperglycemia: Secondary | ICD-10-CM | POA: Diagnosis not present

## 2018-06-15 DIAGNOSIS — J301 Allergic rhinitis due to pollen: Secondary | ICD-10-CM

## 2018-06-15 DIAGNOSIS — N183 Chronic kidney disease, stage 3 (moderate): Secondary | ICD-10-CM

## 2018-06-15 DIAGNOSIS — E871 Hypo-osmolality and hyponatremia: Secondary | ICD-10-CM | POA: Insufficient documentation

## 2018-06-15 DIAGNOSIS — E559 Vitamin D deficiency, unspecified: Secondary | ICD-10-CM | POA: Diagnosis not present

## 2018-06-15 DIAGNOSIS — D259 Leiomyoma of uterus, unspecified: Secondary | ICD-10-CM | POA: Insufficient documentation

## 2018-06-15 DIAGNOSIS — Z90710 Acquired absence of both cervix and uterus: Secondary | ICD-10-CM | POA: Diagnosis not present

## 2018-06-15 DIAGNOSIS — E1122 Type 2 diabetes mellitus with diabetic chronic kidney disease: Secondary | ICD-10-CM | POA: Diagnosis not present

## 2018-06-15 DIAGNOSIS — E1142 Type 2 diabetes mellitus with diabetic polyneuropathy: Secondary | ICD-10-CM | POA: Insufficient documentation

## 2018-06-15 HISTORY — DX: Body Mass Index (BMI) 40.0 and over, adult: Z684

## 2018-06-15 HISTORY — DX: Morbid (severe) obesity due to excess calories: E66.01

## 2018-06-15 HISTORY — DX: Vitamin D deficiency, unspecified: E55.9

## 2018-06-15 LAB — GLUCOSE, POCT (MANUAL RESULT ENTRY): POC Glucose: 130 mg/dl — AB (ref 70–99)

## 2018-06-15 MED ORDER — FLUTICASONE PROPIONATE 50 MCG/ACT NA SUSP: 1.0000 | Freq: Every day | NASAL | 3 refills | Status: DC | PRN

## 2018-06-15 MED ORDER — GLUCOSE BLOOD VI STRP: ORAL_STRIP | 12 refills | Status: DC

## 2018-06-15 MED ORDER — CETIRIZINE HCL 10 MG PO TABS: 10.0000 mg | ORAL_TABLET | Freq: Every day | ORAL | 6 refills | Status: DC

## 2018-06-15 NOTE — Patient Instructions (Addendum)
Please keep your appointment with the neurologist in June. You should not drive until you have not had a seizure in at least 6 months. Do not go swimming alone.  You should take showers rather than siting in a bathtub.  Do not work at [employer].  You should not operate any heavy machinery.  Continue to monitor your blood sugars at least 3 times a day and bring in the readings again on your next visit.  I have enclosed some information about healthy eating habits.  Try to get in some form of aerobic exercise at least 3 to 4 days a week for 30 minutes.

## 2018-06-15 NOTE — Progress Notes (Signed)
Follow-up- PNA Need note to return to work  Needs RF-  Cetirizine Fluticasone Glucose strips  Does not take medication for DM- took herself off. Would like to discuss blood sugars and not taking medication. HgA1C- 14.9- 05/06/2018 at hospital encounter.

## 2018-06-15 NOTE — Progress Notes (Signed)
Patient ID: Rebecca Johnston, female    DOB: [birth year]  MRN: [id number]  CC: Follow-up  Subjective: Rebecca Johnston is a 48 y.o. female who presents for chronic ds management and f/u DM Her concerns today include:  Pt with hx of obesity, DMwithneuropathy, CKD 3, recent dx partial SZ, anemia.  DM:  pt wanting to know if she can continue to stay off med for DM.  Checking BS 2-4 x a day and brings log with her.  Blood sugar range has been between 104-143.  Several outliers in the 160s to 170s. -not eating as much. Less white carbs.  Meats mainly chicken, salmon.  Drinking mainly sugar free drinks Walking now 3 x a wk.  Down 11 lbs since Feb 2020. "Not much " blurred vision.  Last eye exam done at [facility].  03/2018.  No retinopathy. No numbness in feet Needs test stripes  SZ:  No sz since leaving hosp. reports that headaches have also resolved but she was having prior to hospitalization.  No feeling of gait imbalance.  No dizziness.  She did home physical therapy once and then told them not to come back.  She states that she has been doing therapy at home on her own Taking Topamax and Keppra.  Insurance refused payment with Lyrica.   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  Does a lot of sitting.  Would like to return to work ASAP.    CKD 3:  Seen at [facility] just before hosp Told she needs Vit D supplement but does not recall the dose  Anemia: Patient has microcytic anemia.  Had hysterectomy due to fibroids.  Never dx with [place] trait.  Request RF on allergy meds in preparation for allergy symptoms that usually occur during the spring for her.  Patient Active Problem List   Diagnosis Date Noted  . Seizure disorder ([place]) 05/14/2018  . Status epilepticus ([place])   . Headache 05/07/2018  . Lobar pneumonia ([place]) 05/07/2018  . GERD (gastroesophageal reflux disease) 05/07/2018  . Hyponatremia 05/07/2018  . HA (headache)  05/07/2018  . Hyperglycemia   . CKD (chronic kidney disease), stage III ([place]) 04/16/2017  . Neuropathy 04/04/2016  . Hx of iron deficiency anemia 12/21/2015  . Seasonal allergic rhinitis 12/21/2015  . Type II diabetes mellitus with renal manifestations ([place]) 12/21/2015  . Incarcerated hernia s/p lap repair w mesh 12/08/2014 12/08/2014  . Esophageal reflux 10/21/2013  . Abnormal CT scan, chest 09/02/2013     Current Outpatient Medications on File Prior to Visit  Medication Sig Dispense Refill  . levETIRAcetam (KEPPRA) 750 MG tablet Take 1 tablet (750 mg total) by mouth 2 (two) times daily. 60 tablet 3  . ranitidine (ZANTAC) 300 MG tablet 1 tab PO QHS PRN (Patient taking differently: Take 300 mg by mouth at bedtime as needed for heartburn. ) 30 tablet 6  . acetaminophen (TYLENOL) 325 MG tablet Take 2 tablets (650 mg total) by mouth every 6 (six) hours as needed for mild pain or fever.    . blood glucose meter kit and supplies Dispense based on patient and insurance preference. Use up to four times daily as directed. (FOR ICD-10 E10.9, E11.9). 1 each 0  . butalbital-acetaminophen-caffeine (FIORICET, ESGIC) 50-325-40 MG tablet Take 1 tablet by mouth every 6 (six) hours as needed for headache. 20 tablet 0  . PROAIR HFA 108 (90 Base) MCG/ACT inhaler Inhale 2 puffs into the  lungs every 6 (six) hours as needed for wheezing or shortness of breath. (Patient not taking: Reported on 06/01/2018) 1 Inhaler 3  . topiramate (TOPAMAX) 50 MG tablet Take 1 tablet (50 mg total) by mouth 2 (two) times daily. (Patient not taking: Reported on 06/15/2018) 60 tablet 3   No current facility-administered medications on file prior to visit.     Allergies  Allergen Reactions  . Doxycycline     "bad dreaming, hallucination, dizziness" per pt    Social History   Socioeconomic History  . Marital status: Single    Spouse name: Not on file  . Number of children: Not on file  . Years of education: Not on file  .  Highest education level: Not on file  Occupational History  . Not on file  Social Needs  . Financial resource strain: Not on file  . Food insecurity:    Worry: Not on file    Inability: Not on file  . Transportation needs:    Medical: Not on file    Non-medical: Not on file  Tobacco Use  . Smoking status: Former Smoker    Packs/day: 0.25    Years: 25.00    Pack years: 6.25    Types: Cigarettes    Last attempt to quit: 08/02/2015    Years since quitting: 2.8  . Smokeless tobacco: Never Used  Substance and Sexual Activity  . Alcohol use: Not Currently    Alcohol/week: 0.0 standard drinks    Comment: socially  . Drug use: No  . Sexual activity: Not Currently    Birth control/protection: None  Lifestyle  . Physical activity:    Days per week: Not on file    Minutes per session: Not on file  . Stress: Not on file  Relationships  . Social connections:    Talks on phone: Not on file    Gets together: Not on file    Attends religious service: Not on file    Active member of club or organization: Not on file    Attends meetings of clubs or organizations: Not on file    Relationship status: Not on file  . Intimate partner violence:    Fear of current or ex partner: Not on file    Emotionally abused: Not on file    Physically abused: Not on file    Forced sexual activity: Not on file  Other Topics Concern  . Not on file  Social History Narrative  . Not on file    Family History  Problem Relation Age of Onset  . Hypertension Mother   . Diabetes Father   . Breast cancer Maternal Aunt   . Diabetes Maternal Aunt   . Diabetes Paternal Aunt   . Diabetes Maternal Aunt   . Diabetes Maternal Aunt     Past Surgical History:  Procedure Laterality Date  . ABDOMINAL HYSTERECTOMY N/A 12/08/2014   Procedure: HYSTERECTOMY ABDOMINAL;  Surgeon: Woodroe Mode, MD;  Location: [facility];  Service: Gynecology;  Laterality: N/A;  . DILATION AND CURETTAGE OF UTERUS    . DILITATION &  CURRETTAGE/HYSTROSCOPY WITH VERSAPOINT RESECTION N/A 01/06/2014   Procedure: Corena Pilgrim WITH VERSAPOINT;  Surgeon: Woodroe Mode, MD;  Location: [facility];  Service: Gynecology;  Laterality: N/A;  . LAPAROSCOPIC LYSIS OF ADHESIONS N/A 12/08/2014   Procedure: LAPAROSCOPIC LYSIS OF ADHESIONS;  Surgeon: Michael Boston, MD;  Location: [facility];  Service: General;  Laterality: N/A;  . OVARIAN CYST REMOVAL     cyst  not removed. Cyst drained  . SUPRA-UMBILICAL HERNIA N/A 29/09/9890   Procedure: LAPARSCOPIC SUPRA-UMBILICAL HERNIA REPAIR ;  Surgeon: Michael Boston, MD;  Location: [facility];  Service: General;  Laterality: N/A;  . VENTRAL HERNIA REPAIR N/A 12/08/2014   Procedure: LAPAROSCOPIC INCARCERATED INCISIONAL VENTRAL WALL HERNIA REPAIR;  Surgeon: Michael Boston, MD;  Location: [facility];  Service: General;  Laterality: N/A;    ROS: Review of Systems Negative except as stated above  PHYSICAL EXAM: BP 109/76 (BP Location: Right Arm, Patient Position: Sitting, Cuff Size: Large)   Pulse 75   Temp 98.1 F (36.7 C) (Oral)   Resp 20   Ht _0  (1.651 m)   Wt 259 lb 6.4 oz (117.7 kg)   LMP 11/26/2014 (Exact Date)   SpO2 99%   BMI 43.17 kg/m   Wt Readings from Last 3 Encounters:  06/15/18 259 lb 6.4 oz (117.7 kg)  05/25/18 263 lb 0.1 oz (119.3 kg)  05/14/18 257 lb 11.5 oz (116.9 kg)   Physical Exam General appearance - alert, well appearing, and in no distress Mental status - normal mood, behavior, speech, dress, motor activity, and thought processes Neck - supple, no significant adenopathy Chest - clear to auscultation, no wheezes, rales or rhonchi, symmetric air entry Heart - normal rate, regular rhythm, normal S1, S2, no murmurs, rubs, clicks or gallops Neurological - motor and sensory grossly normal bilaterally.  No nystagmus.  No asterixis. Extremities -no lower extremity edema.  CMP Latest Ref Rng & Units 05/25/2018 05/21/2018 05/20/2018  Glucose 70 - 99 mg/dL 113(H) 127(H) 124(H)  BUN 6 - 20 mg/dL  _1 Creatinine 0.44 - 1.00 mg/dL 1.76(H) 1.68(H) 1.74(H)  Sodium 135 - 145 mmol/L 138 135 134(L)  Potassium 3.5 - 5.1 mmol/L 4.4 4.3 4.5  Chloride 98 - 111 mmol/L 105 106 103  CO2 22 - 32 mmol/L 20(L) 21(L) 21(L)  Calcium 8.9 - 10.3 mg/dL 9.3 9.1 9.2  Total Protein 6.5 - 8.1 g/dL - - -  Total Bilirubin 0.3 - 1.2 mg/dL - - -  Alkaline Phos 38 - 126 U/L - - -  AST 15 - 41 U/L - - -  ALT 0 - 44 U/L - - -   Lipid Panel     Component Value Date/Time   CHOL 197 04/16/2017 1501   TRIG 320 (H) 05/09/2018 2049   HDL 39 (L) 04/16/2017 1501   CHOLHDL 5.1 (H) 04/16/2017 1501   CHOLHDL 4.2 12/21/2015 1019   VLDL 34 (H) 12/21/2015 1019   LDLCALC 97 04/16/2017 1501    CBC    Component Value Date/Time   WBC 6.7 05/25/2018 0518   RBC 4.58 05/25/2018 0518   HGB 9.9 (L) 05/25/2018 0518   HGB 12.6 04/16/2017 1501   HCT 33.8 (L) 05/25/2018 0518   HCT 38.5 04/16/2017 1501   PLT 278 05/25/2018 0518   PLT 352 04/16/2017 1501   MCV 73.8 (L) 05/25/2018 0518   MCV 67 (L) 04/16/2017 1501   MCH 21.6 (L) 05/25/2018 0518   MCHC 29.3 (L) 05/25/2018 0518   RDW 17.9 (H) 05/25/2018 0518   RDW 17.0 (H) 04/16/2017 1501   LYMPHSABS 1.4 05/17/2018 0640   MONOABS 0.7 05/17/2018 0640   EOSABS 0.2 05/17/2018 0640   BASOSABS 0.0 05/17/2018 0640    ASSESSMENT AND PLAN: 1. Partial seizures [facility]) Patient to keep appointment with neurology in June.  Advised she should not drive until she is seizure-free for at least 6 months as recommended  by the state.  Patient found this information distressing as she states that she needs to drive for work. -Other seizure precautions given including avoid sitting in bathtub to take bath, avoid swimming alone, avoid operating heavy machinery and avoid working at heights. -Continue Topamax and Keppra.  She asked whether she would be able to get off seizure medication eventually.  I told her that this is something she can discuss with the neurologist in June.  2. Type 2  diabetes mellitus with stage 3 chronic kidney disease, without long-term current use of insulin (HCC) Blood sugar log reveals blood sugars for the most part are at goal.  We will hold off on restarting any diabetic medication.  Encourage her to continue healthy eating habits and regular exercise.  Follow-up again in about 6 weeks with more blood sugar readings - Glucose (CBG) - glucose blood test strip; Check blood sugars three times a day before meals  Dispense: 100 each; Refill: 12 - Microalbumin / creatinine urine ratio  3. Class 3 severe obesity due to excess calories with serious comorbidity and body mass index (BMI) of 40.0 to 44.9 in adult [facility]) See #2 above  4. Seasonal allergic rhinitis due to pollen - cetirizine (ZYRTEC) 10 MG tablet; Take 1 tablet (10 mg total) by mouth daily.  Dispense: 30 tablet; Refill: 6 - fluticasone (FLONASE) 50 MCG/ACT nasal spray; Place 1 spray into both nostrils daily as needed for allergies or rhinitis.  Dispense: 16 g; Refill: 3  5. Microcytic anemia Evaluate for iron deficiency versus a hemoglobinopathy versus chronic disease - Iron, TIBC and Ferritin Panel - CBC - Hemoglobinopathy evaluation  6. Vitamin D deficiency Advised patient to speak with her nephrologist to confirm the dose of the vitamin D that they told her to purchase over-the-counter  Patient was given the opportunity to ask questions.  Patient verbalized understanding of the plan and was able to repeat key elements of the plan.   Orders Placed This Encounter  Procedures  . Iron, TIBC and Ferritin Panel  . CBC  . Hemoglobinopathy evaluation  . Microalbumin / creatinine urine ratio  . Glucose (CBG)     Requested Prescriptions   Signed Prescriptions Disp Refills  . glucose blood test strip 100 each 12    Sig: Check blood sugars three times a day before meals  . cetirizine (ZYRTEC) 10 MG tablet 30 tablet 6    Sig: Take 1 tablet (10 mg total) by mouth daily.  . fluticasone  (FLONASE) 50 MCG/ACT nasal spray 16 g 3    Sig: Place 1 spray into both nostrils daily as needed for allergies or rhinitis.    Return in about 6 weeks (around 07/27/2018).  Karle Plumber, MD, FACP

## 2018-06-16 ENCOUNTER — Telehealth: Payer: Self-pay

## 2018-06-16 DIAGNOSIS — J45909 Unspecified asthma, uncomplicated: Secondary | ICD-10-CM

## 2018-06-16 DIAGNOSIS — G9340 Encephalopathy, unspecified: Secondary | ICD-10-CM | POA: Diagnosis not present

## 2018-06-16 DIAGNOSIS — Z87891 Personal history of nicotine dependence: Secondary | ICD-10-CM

## 2018-06-16 DIAGNOSIS — E1122 Type 2 diabetes mellitus with diabetic chronic kidney disease: Secondary | ICD-10-CM | POA: Diagnosis not present

## 2018-06-16 DIAGNOSIS — E1142 Type 2 diabetes mellitus with diabetic polyneuropathy: Secondary | ICD-10-CM

## 2018-06-16 DIAGNOSIS — Z7951 Long term (current) use of inhaled steroids: Secondary | ICD-10-CM

## 2018-06-16 DIAGNOSIS — G40111 Localization-related (focal) (partial) symptomatic epilepsy and epileptic syndromes with simple partial seizures, intractable, with status epilepticus: Secondary | ICD-10-CM

## 2018-06-16 DIAGNOSIS — N183 Chronic kidney disease, stage 3 (moderate): Secondary | ICD-10-CM

## 2018-06-16 DIAGNOSIS — Z6841 Body Mass Index (BMI) 40.0 and over, adult: Secondary | ICD-10-CM

## 2018-06-16 DIAGNOSIS — E1165 Type 2 diabetes mellitus with hyperglycemia: Secondary | ICD-10-CM | POA: Diagnosis not present

## 2018-06-16 DIAGNOSIS — K219 Gastro-esophageal reflux disease without esophagitis: Secondary | ICD-10-CM

## 2018-06-16 DIAGNOSIS — D631 Anemia in chronic kidney disease: Secondary | ICD-10-CM

## 2018-06-16 LAB — CBC
Hematocrit: 37.2 % (ref 34.0–46.6)
Hemoglobin: 11.6 g/dL (ref 11.1–15.9)
MCH: 22.1 pg — ABNORMAL LOW (ref 26.6–33.0)
MCHC: 31.2 g/dL — ABNORMAL LOW (ref 31.5–35.7)
MCV: 71 fL — ABNORMAL LOW (ref 79–97)
Platelets: 314 10*3/uL (ref 150–450)
RBC: 5.25 x10E6/uL (ref 3.77–5.28)
RDW: 16.3 % — ABNORMAL HIGH (ref 11.7–15.4)
WBC: 7.2 10*3/uL (ref 3.4–10.8)

## 2018-06-16 LAB — HEMOGLOBINOPATHY EVALUATION
HGB C: 0 %
HGB S: 0 %
HGB VARIANT: 0 %
Hemoglobin A2 Quantitation: 2.1 % (ref 1.8–3.2)
Hemoglobin F Quantitation: 0 % (ref 0.0–2.0)
Hgb A: 97.9 % (ref 96.4–98.8)

## 2018-06-16 LAB — IRON,TIBC AND FERRITIN PANEL
Ferritin: 133 ng/mL (ref 15–150)
Iron Saturation: 21 % (ref 15–55)
Iron: 50 ug/dL (ref 27–159)
Total Iron Binding Capacity: 239 ug/dL — ABNORMAL LOW (ref 250–450)
UIBC: 189 ug/dL (ref 131–425)

## 2018-06-16 LAB — MICROALBUMIN / CREATININE URINE RATIO
Creatinine, Urine: 197.4 mg/dL
Microalb/Creat Ratio: 13 mg/g creat (ref 0–29)
Microalbumin, Urine: 25.9 ug/mL

## 2018-06-16 NOTE — Telephone Encounter (Signed)
Contacted pt to go over lab results pt is aware and doesn't have any questions or concerns 

## 2018-06-17 ENCOUNTER — Encounter: Payer: Self-pay | Admitting: Internal Medicine

## 2018-06-17 DIAGNOSIS — D638 Anemia in other chronic diseases classified elsewhere: Secondary | ICD-10-CM | POA: Insufficient documentation

## 2018-06-17 HISTORY — DX: Anemia in other chronic diseases classified elsewhere: D63.8

## 2018-07-19 ENCOUNTER — Other Ambulatory Visit: Payer: Self-pay

## 2018-07-19 ENCOUNTER — Ambulatory Visit: Payer: Self-pay | Admitting: Neurology

## 2018-07-19 ENCOUNTER — Encounter: Payer: Self-pay | Admitting: Neurology

## 2018-07-19 ENCOUNTER — Ambulatory Visit (INDEPENDENT_AMBULATORY_CARE_PROVIDER_SITE_OTHER): Payer: Medicaid Other | Admitting: Neurology

## 2018-07-19 DIAGNOSIS — R569 Unspecified convulsions: Secondary | ICD-10-CM

## 2018-07-19 DIAGNOSIS — G40901 Epilepsy, unspecified, not intractable, with status epilepticus: Secondary | ICD-10-CM

## 2018-07-19 DIAGNOSIS — R739 Hyperglycemia, unspecified: Secondary | ICD-10-CM

## 2018-07-19 MED ORDER — LEVETIRACETAM 750 MG PO TABS: 750.0000 mg | ORAL_TABLET | Freq: Two times a day (BID) | ORAL | 4 refills | Status: DC

## 2018-07-19 NOTE — Progress Notes (Signed)
PATIENT: Rebecca Johnston DOB: [birth year]  Virtual Visit via video  I connected with William Dalton on 07/19/18 at  by video and verified that I am speaking with the correct person using two identifiers.   I discussed the limitations, risks, security and privacy concerns of performing an evaluation and management service by video and the availability of in person appointments. I also discussed with the patient that there may be a patient responsible charge related to this service. The patient expressed understanding and agreed to proceed.  HISTORICAL  Rebecca Johnston is a 48 year old female, seen in request by her primary care physician Dr. Wynetta Emery, Neoma Laming B, for seizure.  I have reviewed and summarized the referring note from the referring physician.  She had a past medical history of diabetes, quit smoking 2 years ago, chronic kidney disease, GERD, morbid obesity with MBI of 73, she was working as Neurosurgeon, which required driving to community.  She presented to [facility] on May 06, 2018 for headaches, blurred vision, dizziness, she described headache at the right temporal region, was noted to have some slurred speech, facial droop, with occasionally nausea, vomiting, she was treated for community-acquired pneumonia on May 28, 2018 because persistent nasal congestion, was treated with doxycycline, I personally reviewed CT and MRI of the brain showed no significant abnormality  CT angiogram of head neck was negative  UDS was negative, glucose on admission was elevated 355, hyponatremia of 127, bicarb was low at 18, she was given Reglan and Compazine for nausea, later she was noted to have eye deviation, suspected for seizure, she was loaded with Dilantin then Keppra, and the Lyrica, EEG showed 2 electrographic seizure activity emanating out of the right temporal region, spreading mostly to the entire right side, was seen by neuro hospitalist, contributed  her partial  seizure to hypoglycemia.  She was intubated on March 8 to May 11, 2018, had bouts of hypotension require epinephrine, also required nutritional support  She was discharged to rehabilitation on March 13-24, Keppra dose was adjusted to 750 mg twice a day, poorly controlled diabetes, A1c was 14.9,  Laboratory evaluations in lab in April showed normal CBC,BMP, creat 1.76.,  Total iron-binding capacity was 239,   Observations/Objective: I have reviewed problem lists, medications, allergies.  Awake alert oriented to history taking and care of conversation, facial were symmetric, moves 4 extremities without difficulty  Assessment and Plan: Partial seizure status on May 06, 2018  Was contributed to her hypoglycemia, poorly controlled diabetes, A1c was 14.9,  She had normal MRI of the brain, EEG on March 6 showed 2 electrographic seizure emanating out of the right temporal region, spreading to the entire right side.  Keep Keppra 750 mg twice a day  Repeat EEG  No driving until seizure-free for 6 months  Follow Up Instructions:     in 6 months with Dr. Evelena Leyden   I discussed the assessment and treatment plan with the patient. The patient was provided an opportunity to ask questions and all were answered. The patient agreed with the plan and demonstrated an understanding of the instructions.   The patient was advised to call back or seek an in-person evaluation if the symptoms worsen or if the condition fails to improve as anticipated.  I provided 45 minutes of non-face-to-face time during this encounter.  REVIEW OF SYSTEMS: Full 14 system review of systems performed and notable only for as above All other review of systems were negative.  ALLERGIES: Allergies  Allergen Reactions  . Doxycycline     "bad dreaming, hallucination, dizziness" per pt    HOME MEDICATIONS: Current Outpatient Medications  Medication Sig Dispense Refill  . acetaminophen (TYLENOL) 325 MG tablet Take 2 tablets  (650 mg total) by mouth every 6 (six) hours as needed for mild pain or fever.    . blood glucose meter kit and supplies Dispense based on patient and insurance preference. Use up to four times daily as directed. (FOR ICD-10 E10.9, E11.9). 1 each 0  . butalbital-acetaminophen-caffeine (FIORICET, ESGIC) 50-325-40 MG tablet Take 1 tablet by mouth every 6 (six) hours as needed for headache. 20 tablet 0  . cetirizine (ZYRTEC) 10 MG tablet Take 1 tablet (10 mg total) by mouth daily. 30 tablet 6  . fluticasone (FLONASE) 50 MCG/ACT nasal spray Place 1 spray into both nostrils daily as needed for allergies or rhinitis. 16 g 3  . glucose blood test strip Check blood sugars three times a day before meals 100 each 12  . levETIRAcetam (KEPPRA) 750 MG tablet Take 1 tablet (750 mg total) by mouth 2 (two) times daily. 60 tablet 3  . PROAIR HFA 108 (90 Base) MCG/ACT inhaler Inhale 2 puffs into the lungs every 6 (six) hours as needed for wheezing or shortness of breath. 1 Inhaler 3  . ranitidine (ZANTAC) 300 MG tablet 1 tab PO QHS PRN (Patient taking differently: Take 300 mg by mouth at bedtime as needed for heartburn. ) 30 tablet 6  . topiramate (TOPAMAX) 50 MG tablet Take 1 tablet (50 mg total) by mouth 2 (two) times daily. 60 tablet 3   No current facility-administered medications for this visit.     PAST MEDICAL HISTORY: Past Medical History:  Diagnosis Date  . Anemia   . Asthma   . Diabetes mellitus without complication ([place]) 53/6644  . Fibroids   . GERD (gastroesophageal reflux disease)   . History of blood transfusion Feb 2015  . Obesity, Class III, BMI 40-49.9 (morbid obesity) ([place])   . Shortness of breath dyspnea     PAST SURGICAL HISTORY: Past Surgical History:  Procedure Laterality Date  . ABDOMINAL HYSTERECTOMY N/A 12/08/2014   Procedure: HYSTERECTOMY ABDOMINAL;  Surgeon: Woodroe Mode, MD;  Location: [facility];  Service: Gynecology;  Laterality: N/A;  . DILATION AND CURETTAGE OF UTERUS    .  DILITATION & CURRETTAGE/HYSTROSCOPY WITH VERSAPOINT RESECTION N/A 01/06/2014   Procedure: Corena Pilgrim WITH VERSAPOINT;  Surgeon: Woodroe Mode, MD;  Location: [facility];  Service: Gynecology;  Laterality: N/A;  . LAPAROSCOPIC LYSIS OF ADHESIONS N/A 12/08/2014   Procedure: LAPAROSCOPIC LYSIS OF ADHESIONS;  Surgeon: Michael Boston, MD;  Location: [facility];  Service: General;  Laterality: N/A;  . OVARIAN CYST REMOVAL     cyst not removed. Cyst drained  . SUPRA-UMBILICAL HERNIA N/A 05/05/7423   Procedure: LAPARSCOPIC SUPRA-UMBILICAL HERNIA REPAIR ;  Surgeon: Michael Boston, MD;  Location: [facility];  Service: General;  Laterality: N/A;  . VENTRAL HERNIA REPAIR N/A 12/08/2014   Procedure: LAPAROSCOPIC INCARCERATED INCISIONAL VENTRAL WALL HERNIA REPAIR;  Surgeon: Michael Boston, MD;  Location: [facility];  Service: General;  Laterality: N/A;    FAMILY HISTORY: Family History  Problem Relation Age of Onset  . Hypertension Mother   . Diabetes Father   . Breast cancer Maternal Aunt   . Diabetes Maternal Aunt   . Diabetes Paternal Aunt   . Diabetes Maternal Aunt   . Diabetes Maternal Aunt     SOCIAL HISTORY:   Social  History   Socioeconomic History  . Marital status: Single    Spouse name: Not on file  . Number of children: Not on file  . Years of education: Not on file  . Highest education level: Not on file  Occupational History  . Not on file  Social Needs  . Financial resource strain: Not on file  . Food insecurity:    Worry: Not on file    Inability: Not on file  . Transportation needs:    Medical: Not on file    Non-medical: Not on file  Tobacco Use  . Smoking status: Former Smoker    Packs/day: 0.25    Years: 25.00    Pack years: 6.25    Types: Cigarettes    Last attempt to quit: 08/02/2015    Years since quitting: 2.9  . Smokeless tobacco: Never Used  Substance and Sexual Activity  . Alcohol use: Not Currently    Alcohol/week: 0.0 standard drinks    Comment: socially  . Drug use: No   . Sexual activity: Not Currently    Birth control/protection: None  Lifestyle  . Physical activity:    Days per week: Not on file    Minutes per session: Not on file  . Stress: Not on file  Relationships  . Social connections:    Talks on phone: Not on file    Gets together: Not on file    Attends religious service: Not on file    Active member of club or organization: Not on file    Attends meetings of clubs or organizations: Not on file    Relationship status: Not on file  . Intimate partner violence:    Fear of current or ex partner: Not on file    Emotionally abused: Not on file    Physically abused: Not on file    Forced sexual activity: Not on file  Other Topics Concern  . Not on file  Social History Narrative  . Not on file    Marcial Pacas, M.D. Ph.D.  [facility] [address] [place], [place] [address] Ph: [phone] Fax: [phone]  CC: Ladell Pier, MD

## 2018-07-27 ENCOUNTER — Encounter: Payer: Self-pay | Admitting: Internal Medicine

## 2018-07-27 ENCOUNTER — Ambulatory Visit: Payer: Medicaid Other | Attending: Internal Medicine | Admitting: Internal Medicine

## 2018-07-27 DIAGNOSIS — R569 Unspecified convulsions: Secondary | ICD-10-CM | POA: Diagnosis not present

## 2018-07-27 DIAGNOSIS — E1122 Type 2 diabetes mellitus with diabetic chronic kidney disease: Secondary | ICD-10-CM

## 2018-07-27 DIAGNOSIS — D631 Anemia in chronic kidney disease: Secondary | ICD-10-CM | POA: Diagnosis not present

## 2018-07-27 DIAGNOSIS — E559 Vitamin D deficiency, unspecified: Secondary | ICD-10-CM

## 2018-07-27 DIAGNOSIS — N183 Chronic kidney disease, stage 3 unspecified: Secondary | ICD-10-CM

## 2018-07-27 DIAGNOSIS — G43009 Migraine without aura, not intractable, without status migrainosus: Secondary | ICD-10-CM | POA: Diagnosis not present

## 2018-07-27 HISTORY — DX: Anemia in chronic kidney disease: D63.1

## 2018-07-27 HISTORY — DX: Chronic kidney disease, stage 3 unspecified: N18.30

## 2018-07-27 MED ORDER — GLUCOSE BLOOD VI STRP: ORAL_STRIP | 12 refills | Status: DC

## 2018-07-27 NOTE — Progress Notes (Signed)
Contacted pt to start her televisit explained who I am and pt had an attitude stating now she know it's a televisit because she was here and the front desk informed her that it was a televisit  I informed pt that she should have received a call on Friday and I apologize if she didn't. Pt states this is the third apology she has gotten. Pt states she should have been informed that it was going to be a televisit when she made her appt back in April.   I continue to work pt up. I asked pt if she was in any pain pt states she wasn't and she didn't like my attitude. Informed pt that I didn't have an attitude. I informed pt that she is the one who has the attitude and I am just trying to work her up. Pt then stated that "we can get it"   After pt said that I informed pt that I am going to end the call and I will just have Dr. Wynetta Emery give her a call and I hung up

## 2018-07-27 NOTE — Progress Notes (Signed)
Virtual Visit via Telephone Note Due to current restrictions/limitations of in-office visits due to the COVID-19 pandemic, this scheduled clinical appointment was converted to a telehealth visit  I connected with Rebecca Johnston on 07/27/18 at 10:01 a.m EDT by telephone and verified that I am speaking with the correct person using two identifiers. I am in my office.  The patient is at home.  Only the patient and myself participated in this encounter.  I discussed the limitations, risks, security and privacy concerns of performing an evaluation and management service by telephone and the availability of in person appointments. I also discussed with the patient that there may be a patient responsible charge related to this service. The patient expressed understanding and agreed to proceed.   History of Present Illness: Pt with hx of obesity, DMwithneuropathy, CKD 3, recent dx partial SZ, anemia.  Patient last seen about 6 weeks ago.  I did apologize to the patient for the mixup that occurred at the front desk this morning.  I told her that we do give patient the option of a telephone visit versus being seen in person.  Patient had already left the office when I called her and was agreeable to doing a telephone encounter rather than rescheduling for an in person visit.   Partial SZ:  Did virtual visit with Dr. Krista Blue.  Plan is for repeat EEG and dose of Keppra was unchanged No sz since last visit  DM type 2: checking BS TID and sometimes at bedtime.  Recent bedtime BS 105, 135, 163.  Morning blood sugars have ranged between 120-139.  She had one outlier that was 159.  Afternoon blood sugars have been 105-155. Eating habits:  "doing fairly well."  Drinks mainly water and un-sweet tea.  Eating less white carbs.  Walking more several times a wk -feels she has loss some wgh since last visit.  Has a scale at home but needs a battery.    Anemia:  Hb Improved from 9.9 to 11.6.  Iron studies was c/w ACD  CKD  stage 3/Vit D Def:  Saw nephrology again since last visit.  Told that kidney function is stable.  Placed on once weekly high dose vit D she is taking consistently.  No lower extremity edema.  HA:  Decreased since hosp in March.  Last took Fioricet about 1 wk ago.  Started taking Topamax PRN since headache frequency has been less  Observations/Objective: Results for orders placed or performed in visit on 06/15/18  Iron, TIBC and Ferritin Panel  Result Value Ref Range   Total Iron Binding Capacity 239 (L) 250 - 450 ug/dL   UIBC 189 131 - 425 ug/dL   Iron 50 27 - 159 ug/dL   Iron Saturation 21 15 - 55 %   Ferritin 133 15 - 150 ng/mL  CBC  Result Value Ref Range   WBC 7.2 3.4 - 10.8 x10E3/uL   RBC 5.25 3.77 - 5.28 x10E6/uL   Hemoglobin 11.6 11.1 - 15.9 g/dL   Hematocrit 37.2 34.0 - 46.6 %   MCV 71 (L) 79 - 97 fL   MCH 22.1 (L) 26.6 - 33.0 pg   MCHC 31.2 (L) 31.5 - 35.7 g/dL   RDW 16.3 (H) 11.7 - 15.4 %   Platelets 314 150 - 450 x10E3/uL  Hemoglobinopathy evaluation  Result Value Ref Range   Hemoglobin F Quantitation 0.0 0.0 - 2.0 %   Hgb A 97.9 96.4 - 98.8 %   HGB S 0.0 0.0 %  HGB C 0.0 0.0 %   Hemoglobin A2 Quantitation 2.1 1.8 - 3.2 %   HGB VARIANT 0.0 0.0 %   HGB INTERPRETATION Comment   Microalbumin / creatinine urine ratio  Result Value Ref Range   Creatinine, Urine 197.4 Not Estab. mg/dL   Microalbumin, Urine 25.9 Not Estab. ug/mL   Microalb/Creat Ratio 13 0 - 29 mg/g creat  Glucose (CBG)  Result Value Ref Range   POC Glucose 130 (A) 70 - 99 mg/dl     Assessment and Plan: 1. Type 2 diabetes mellitus with stage 3 chronic kidney disease, without long-term current use of insulin ([place]) Reported blood sugar range still acceptable to be observe off medication.  However I would like for her to come to the lab to have an A1c level done.  Encourage her to continue healthy eating habits and regular exercise. - Hemoglobin A1c; Future - Comprehensive metabolic panel; Future -  Lipid panel; Future - glucose blood test strip; Check blood sugars three times a day before meals  Dispense: 100 each; Refill: 12  2. Partial seizures ([place]) Controlled on Keppra.  She is seen the neurologist and plan is for EEG.  Reminded her again that she should not drive until she has been seizure-free for at least 6 months.  3. Anemia due to stage 3 chronic kidney disease (HCC) Improved - Comprehensive metabolic panel; Future  4. Vitamin D deficiency On vitamin D supplement  5. Migraine without aura and without status migrainosus, not intractable Frequency has been less.  However patient advised that Topamax is not meant to be taken as needed.  Discussed the difference between abortive therapy versus prophylaxis.  Recommend that if she takes the Topamax it needs to be taken every day as prescribed   Follow Up Instructions: F/u in 2 mths   I discussed the assessment and treatment plan with the patient. The patient was provided an opportunity to ask questions and all were answered. The patient agreed with the plan and demonstrated an understanding of the instructions.   The patient was advised to call back or seek an in-person evaluation if the symptoms worsen or if the condition fails to improve as anticipated.  I provided 26 minutes of non-face-to-face time during this encounter.   Karle Plumber, MD

## 2018-08-02 ENCOUNTER — Ambulatory Visit: Payer: Medicaid Other | Admitting: Neurology

## 2018-08-16 ENCOUNTER — Other Ambulatory Visit: Payer: Self-pay

## 2018-08-16 ENCOUNTER — Ambulatory Visit (INDEPENDENT_AMBULATORY_CARE_PROVIDER_SITE_OTHER): Payer: Medicaid Other | Admitting: Neurology

## 2018-08-16 DIAGNOSIS — R569 Unspecified convulsions: Secondary | ICD-10-CM

## 2018-08-16 DIAGNOSIS — G40901 Epilepsy, unspecified, not intractable, with status epilepticus: Secondary | ICD-10-CM | POA: Diagnosis not present

## 2018-08-24 NOTE — Procedures (Signed)
   HISTORY: 48 year old female, with history of seizure  TECHNIQUE:  This is a routine 16 channel EEG recording with one channel devoted to a limited EKG recording.  It was performed during wakefulness, drowsiness and asleep.  Hyperventilation and photic stimulation were performed as activating procedures.  There are minimum muscle and movement artifact noted.  Upon maximum arousal, posterior dominant waking rhythm consistent of rhythmic alpha range activity, with frequency of 8 hz. Activities are symmetric over the bilateral posterior derivations and attenuated with eye opening.  Hyperventilation produced mild/moderate buildup with higher amplitude and the slower activities noted.  Photic stimulation did not alter the tracing.  During EEG recording, patient developed drowsiness and no deeper stage of sleep was achieved During EEG recording, there was no epileptiform discharge noted.  EKG demonstrate sinus rhythm, with heart rate of 72 bpm CONCLUSION: This is a  normal awake EEG.  There is no electrodiagnostic evidence of epileptiform discharge.  Marcial Pacas, M.D. Ph.D.  [facility] [place], [place] [address] Phone: [phone] Fax:      [phone]

## 2018-09-13 ENCOUNTER — Telehealth: Payer: Self-pay | Admitting: Neurology

## 2018-09-13 NOTE — Telephone Encounter (Signed)
Patient called and stated that she has not received results on her EEG that was done on June 15th. Please call and advise.

## 2018-09-13 NOTE — Telephone Encounter (Signed)
She is aware of the normal EEG result.  She verbalized understanding to continue her medication as prescribed and to keep her follow up appts.

## 2018-10-07 ENCOUNTER — Other Ambulatory Visit: Payer: Self-pay | Admitting: Internal Medicine

## 2018-10-07 ENCOUNTER — Other Ambulatory Visit: Payer: Self-pay

## 2018-10-07 DIAGNOSIS — Z1231 Encounter for screening mammogram for malignant neoplasm of breast: Secondary | ICD-10-CM

## 2018-10-07 DIAGNOSIS — R6889 Other general symptoms and signs: Secondary | ICD-10-CM | POA: Diagnosis not present

## 2018-10-07 DIAGNOSIS — Z20822 Contact with and (suspected) exposure to covid-19: Secondary | ICD-10-CM

## 2018-10-08 LAB — NOVEL CORONAVIRUS, NAA: SARS-CoV-2, NAA: NOT DETECTED

## 2018-10-08 LAB — SPECIMEN STATUS REPORT

## 2018-10-18 NOTE — Progress Notes (Signed)
PATIENT: Rebecca Johnston DOB: August 04, 1970  REASON FOR VISIT: follow up HISTORY FROM: patient  HISTORY OF PRESENT ILLNESS: Today 10/19/18  HISTORY  Rebecca Johnston is a 48 year old female, seen in request by her primary care physician Dr. Wynetta Emery, [place] Laming B, for seizure.  I have reviewed and summarized the referring note from the referring physician.  She had a past medical history of diabetes, quit smoking 2 years ago, chronic kidney disease, GERD, morbid obesity with MBI of 8, she was working as Neurosurgeon, which required driving to community.  She presented to [facility] on May 06, 2018 for headaches, blurred vision, dizziness, she described headache at the right temporal region, was noted to have some slurred speech, facial droop, with occasionally nausea, vomiting, she was treated for community-acquired pneumonia on May 28, 2018 because persistent nasal congestion, was treated with doxycycline, I personally reviewed CT and MRI of the brain showed no significant abnormality  CT angiogram of head neck was negative  UDS was negative, glucose on admission was elevated 355, hyponatremia of 127, bicarb was low at 18, she was given Reglan and Compazine for nausea, later she was noted to have eye deviation, suspected for seizure, she was loaded with Dilantin then Keppra, and the Lyrica, EEG showed 2 electrographic seizure activity emanating out of the right temporal region, spreading mostly to the entire right side, was seen by neuro hospitalist, contributed  her partial seizure to hypoglycemia.  She was intubated on March 8 to May 11, 2018, had bouts of hypotension require epinephrine, also required nutritional support  She was discharged to rehabilitation on March 13-24, Keppra dose was adjusted to 750 mg twice a day, poorly controlled diabetes, A1c was 14.9,  Laboratory evaluations in lab in April showed normal CBC,BMP, creat 1.76.,  Total iron-binding  capacity was 239,  Update October 19, 2018 SS: History of partial complex seizures May 06, 2018, remains on Keppra 750 mg twice a day, she had normal EEG in June 2020, seizures likely precipitated by poorly controlled diabetes, PNA. She was very sick in the hospital with PNA, was prescribed doxycycline, got very sick, allergic reaction. She has done well, no recurrent seizures. 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 Is not taking any diabetes medications. She follows with [facility].   REVIEW OF SYSTEMS: Out of a complete 14 system review of symptoms, the patient complains only of the following symptoms, and all other reviewed systems are negative.  Seizures  ALLERGIES: Allergies  Allergen Reactions  . Doxycycline     "bad dreaming, hallucination, dizziness" per pt    HOME MEDICATIONS: Outpatient Medications Prior to Visit  Medication Sig Dispense Refill  . blood glucose meter kit and supplies Dispense based on patient and insurance preference. Use up to four times daily as directed. (FOR ICD-10 E10.9, E11.9). 1 each 0  . butalbital-acetaminophen-caffeine (FIORICET, ESGIC) 50-325-40 MG tablet Take 1 tablet by mouth every 6 (six) hours as needed for headache. 20 tablet 0  . cetirizine (ZYRTEC) 10 MG tablet Take 1 tablet (10 mg total) by mouth daily. 30 tablet 6  . fluticasone (FLONASE) 50 MCG/ACT nasal spray Place 1 spray into both nostrils daily as needed for allergies or rhinitis. 16 g 3  . glucose blood test strip Check blood sugars three times a day before meals 100 each 12  . levETIRAcetam (KEPPRA) 750 MG tablet Take 1 tablet (750 mg total) by mouth 2 (two)  times daily. 180 tablet 4  . topiramate (TOPAMAX) 50 MG tablet Take 1 tablet (50 mg total) by mouth 2 (two) times daily. 60 tablet 3  . Vitamin D, Ergocalciferol, (DRISDOL) 1.25 MG (50000 UT) CAPS capsule Take 50,000 Units by mouth once a week. OTC    . acetaminophen (TYLENOL) 325 MG tablet  Take 2 tablets (650 mg total) by mouth every 6 (six) hours as needed for mild pain or fever.    Marland Kitchen PROAIR HFA 108 (90 Base) MCG/ACT inhaler Inhale 2 puffs into the lungs every 6 (six) hours as needed for wheezing or shortness of breath. 1 Inhaler 3   No facility-administered medications prior to visit.     PAST MEDICAL HISTORY: Past Medical History:  Diagnosis Date  . Anemia   . Asthma   . Diabetes mellitus without complication ([place]) 28/7681  . Fibroids   . GERD (gastroesophageal reflux disease)   . History of blood transfusion Feb 2015  . Obesity, Class III, BMI 40-49.9 (morbid obesity) ([place])   . Shortness of breath dyspnea     PAST SURGICAL HISTORY: Past Surgical History:  Procedure Laterality Date  . ABDOMINAL HYSTERECTOMY N/A 12/08/2014   Procedure: HYSTERECTOMY ABDOMINAL;  Surgeon: Woodroe Mode, MD;  Location: [facility];  Service: Gynecology;  Laterality: N/A;  . DILATION AND CURETTAGE OF UTERUS    . DILITATION & CURRETTAGE/HYSTROSCOPY WITH VERSAPOINT RESECTION N/A 01/06/2014   Procedure: Corena Pilgrim WITH VERSAPOINT;  Surgeon: Woodroe Mode, MD;  Location: [facility];  Service: Gynecology;  Laterality: N/A;  . LAPAROSCOPIC LYSIS OF ADHESIONS N/A 12/08/2014   Procedure: LAPAROSCOPIC LYSIS OF ADHESIONS;  Surgeon: Michael Boston, MD;  Location: [facility];  Service: General;  Laterality: N/A;  . OVARIAN CYST REMOVAL     cyst not removed. Cyst drained  . SUPRA-UMBILICAL HERNIA N/A 15/09/2618   Procedure: LAPARSCOPIC SUPRA-UMBILICAL HERNIA REPAIR ;  Surgeon: Michael Boston, MD;  Location: [facility];  Service: General;  Laterality: N/A;  . VENTRAL HERNIA REPAIR N/A 12/08/2014   Procedure: LAPAROSCOPIC INCARCERATED INCISIONAL VENTRAL WALL HERNIA REPAIR;  Surgeon: Michael Boston, MD;  Location: [facility];  Service: General;  Laterality: N/A;    FAMILY HISTORY: Family History  Problem Relation Age of Onset  . Hypertension Mother   . Diabetes Father   . Breast cancer Maternal Aunt   . Diabetes Maternal  Aunt   . Diabetes Paternal Aunt   . Diabetes Maternal Aunt   . Diabetes Maternal Aunt     SOCIAL HISTORY: Social History   Socioeconomic History  . Marital status: Single    Spouse name: Not on file  . Number of children: Not on file  . Years of education: Not on file  . Highest education level: Not on file  Occupational History  . Not on file  Social Needs  . Financial resource strain: Not on file  . Food insecurity    Worry: Not on file    Inability: Not on file  . Transportation needs    Medical: Not on file    Non-medical: Not on file  Tobacco Use  . Smoking status: Former Smoker    Packs/day: 0.25    Years: 25.00    Pack years: 6.25    Types: Cigarettes    Quit date: 08/02/2015    Years since quitting: 3.2  . Smokeless tobacco: Never Used  Substance and Sexual Activity  . Alcohol use: Not Currently    Alcohol/week: 0.0 standard drinks    Comment: socially  .  Drug use: No  . Sexual activity: Not Currently    Birth control/protection: None  Lifestyle  . Physical activity    Days per week: Not on file    Minutes per session: Not on file  . Stress: Not on file  Relationships  . Social Herbalist on phone: Not on file    Gets together: Not on file    Attends religious service: Not on file    Active member of club or organization: Not on file    Attends meetings of clubs or organizations: Not on file    Relationship status: Not on file  . Intimate partner violence    Fear of current or ex partner: Not on file    Emotionally abused: Not on file    Physically abused: Not on file    Forced sexual activity: Not on file  Other Topics Concern  . Not on file  Social History Narrative  . Not on file      PHYSICAL EXAM  Vitals:   10/19/18 1038  BP: 110/76  Pulse: 88  Temp: (!) 97.1 F (36.2 C)  Weight: 270 lb 9.6 oz (122.7 kg)  Height: '5\' 5"'  (1.651 m)   Body mass index is 45.03 kg/m.  Generalized: Well developed, in no acute distress    Neurological examination  Mentation: Alert oriented to time, place, history taking. Follows all commands speech and language fluent Cranial nerve II-XII: Pupils were equal round reactive to light. Extraocular movements were full, visual field were full on confrontational test. Facial sensation and strength were normal.  Head turning and shoulder shrug  were normal and symmetric. Motor: The motor testing reveals 5 over 5 strength of all 4 extremities. Good symmetric motor tone is noted throughout.  Sensory: Sensory testing is intact to soft touch on all 4 extremities. No evidence of extinction is noted.  Coordination: Cerebellar testing reveals good finger-nose-finger and heel-to-shin bilaterally.  Gait and station: Gait is normal. Tandem gait is normal. Romberg is negative. No drift is seen.  Reflexes: Deep tendon reflexes are symmetric and normal bilaterally.   DIAGNOSTIC DATA (LABS, IMAGING, TESTING) - I reviewed patient records, labs, notes, testing and imaging myself where available.  Lab Results  Component Value Date   WBC 7.2 06/15/2018   HGB 11.6 06/15/2018   HCT 37.2 06/15/2018   MCV 71 (L) 06/15/2018   PLT 314 06/15/2018      Component Value Date/Time   NA 138 05/25/2018 0518   NA 139 04/16/2017 1501   K 4.4 05/25/2018 0518   CL 105 05/25/2018 0518   CO2 20 (L) 05/25/2018 0518   GLUCOSE 113 (H) 05/25/2018 0518   GLUCOSE 79 05/06/2013 1240   BUN 19 05/25/2018 0518   BUN 20 04/16/2017 1501   CREATININE 1.76 (H) 05/25/2018 0518   CREATININE 1.65 (H) 12/21/2015 1019   CALCIUM 9.3 05/25/2018 0518   PROT 6.9 05/17/2018 0640   PROT 7.7 04/16/2017 1501   ALBUMIN 2.8 (L) 05/17/2018 0640   ALBUMIN 4.1 04/16/2017 1501   AST 23 05/17/2018 0640   ALT 26 05/17/2018 0640   ALKPHOS 179 (H) 05/17/2018 0640   BILITOT 0.5 05/17/2018 0640   BILITOT 0.2 04/16/2017 1501   GFRNONAA 34 (L) 05/25/2018 0518   GFRNONAA 37 (L) 12/21/2015 1019   GFRAA 39 (L) 05/25/2018 0518   GFRAA 43 (L)  12/21/2015 1019   Lab Results  Component Value Date   CHOL 197 04/16/2017   HDL 39 (  L) 04/16/2017   LDLCALC 97 04/16/2017   TRIG 320 (H) 05/09/2018   CHOLHDL 5.1 (H) 04/16/2017   Lab Results  Component Value Date   HGBA1C 14.9 (H) 05/06/2018   Lab Results  Component Value Date   VITAMINB12 683 04/04/2016   Lab Results  Component Value Date   TSH 1.428 05/08/2018    ASSESSMENT AND PLAN 48 y.o. year old female  has a past medical history of Anemia, Asthma, Diabetes mellitus without complication ([place]) (18/4859), Fibroids, GERD (gastroesophageal reflux disease), History of blood transfusion (Feb 2015), Obesity, Class III, BMI 40-49.9 (morbid obesity) ([place]), and Shortness of breath dyspnea. here with:  1.  Partial seizures on May 06, 2018 -Likely attributed to her poorly controlled diabetes, A1c was 14.9 -Normal MRI of the brain, EEG May 07, 2018 showed 2 electrographic seizure emanating out of the right temporal region, spreading to the entire right time -Repeat EEG in June 2020 was normal -She has not had recurrent seizure -She remains on Keppra 750 mg twice a day, tolerating well, is compliant -She has not had a repeat labs (repeat A1c), she is not on any diabetic medications, has been monitoring her sugars, does follow with her primary doctor, Dr. Wynetta Emery -Given abnormal EEG, we will continue Keppra for now, and monitor overtime, we will follow A1C overtime managed by PCP -No driving until seizure-free for 6 months, she will need documentation that she can begin driving again for her work, will need to clarify what driving is required for her work, am interested in her repeat labs for diabetes before giving clearance  -Follow-up in 6 months at our office   I spent 15 minutes with the patient. 50% of this time was spent discussing her plan of care.   Butler Denmark, AGNP-C, DNP 10/19/2018, 10:47 AM [facility] [address] [place], [place] [address]  [phone]

## 2018-10-19 ENCOUNTER — Other Ambulatory Visit: Payer: Self-pay

## 2018-10-19 ENCOUNTER — Telehealth: Payer: Self-pay

## 2018-10-19 ENCOUNTER — Ambulatory Visit: Payer: Medicaid Other | Admitting: Neurology

## 2018-10-19 ENCOUNTER — Encounter: Payer: Self-pay | Admitting: Neurology

## 2018-10-19 VITALS — BP 110/76 | HR 88 | Temp 97.1°F | Ht 65.0 in | Wt 270.6 lb

## 2018-10-19 DIAGNOSIS — Z6841 Body Mass Index (BMI) 40.0 and over, adult: Secondary | ICD-10-CM | POA: Diagnosis not present

## 2018-10-19 DIAGNOSIS — G40909 Epilepsy, unspecified, not intractable, without status epilepticus: Secondary | ICD-10-CM

## 2018-10-19 DIAGNOSIS — E1129 Type 2 diabetes mellitus with other diabetic kidney complication: Secondary | ICD-10-CM | POA: Diagnosis not present

## 2018-10-19 DIAGNOSIS — Z862 Personal history of diseases of the blood and blood-forming organs and certain disorders involving the immune mechanism: Secondary | ICD-10-CM | POA: Diagnosis not present

## 2018-10-19 DIAGNOSIS — N183 Chronic kidney disease, stage 3 (moderate): Secondary | ICD-10-CM | POA: Diagnosis not present

## 2018-10-19 NOTE — Telephone Encounter (Signed)
Please contact pt and make her aware that she will need to be fasting for blood work. Pt has future labs in chart that needs to be drawn same day

## 2018-10-19 NOTE — Patient Instructions (Signed)
Please have lab work done through your primary doctor that has already been ordered.

## 2018-10-19 NOTE — Telephone Encounter (Signed)
Pt states needs A1C drawn this week. Pt states lab order is in chart. Appt made for A1C tomorrow 10/20/18.  Please call pt to confirm if any other labs need done and if so does she need to fast. [phone].

## 2018-10-20 ENCOUNTER — Other Ambulatory Visit: Payer: Medicaid Other

## 2018-10-20 ENCOUNTER — Telehealth: Payer: Self-pay | Admitting: Obstetrics & Gynecology

## 2018-10-20 NOTE — Telephone Encounter (Signed)
Attempted to speak with Rebecca Johnston about changing her appointment due to changes made in the office. Left a message on her voicemail.

## 2018-10-20 NOTE — Progress Notes (Signed)
I have reviewed and agreed above plan. 

## 2018-11-01 ENCOUNTER — Telehealth: Payer: Self-pay | Admitting: Neurology

## 2018-11-01 ENCOUNTER — Telehealth: Payer: Self-pay | Admitting: Internal Medicine

## 2018-11-01 DIAGNOSIS — G40909 Epilepsy, unspecified, not intractable, without status epilepticus: Secondary | ICD-10-CM

## 2018-11-01 NOTE — Telephone Encounter (Signed)
Pt has called asking if the A1C report has been received, pt states this is a part of her being released from the care of Dr Krista Blue & NP Judson Roch.  Pt is asking for a call to discuss

## 2018-11-01 NOTE — Telephone Encounter (Signed)
Spoke to pt.  She had labs done 10-19-18 at her [facility].  I called [phone] and spoke to receptionist.  She will fax labs to [place].  Pt is 6 month sz free from 05-08-18.  (SS/NP awaiting lab results to  Make decision on going back to work).  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  She questioned doing a keppra level, I did not see this on note.  Sarah/NP out of office, will forward to Dr. Krista Blue.

## 2018-11-01 NOTE — Telephone Encounter (Signed)
Received lab results from [facility].  HgbA1c level 8.1.  Other results to inbox.

## 2018-11-01 NOTE — Telephone Encounter (Addendum)
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  She needs a letter stating she can drive after being six months seizure free.  She reports her last seizure to be on 05/08/2018.    States Judson Roch told her we would need to obtain a Keppra level prior to giving her this letter.

## 2018-11-01 NOTE — Telephone Encounter (Signed)
Patient called stating that she went to another provider office and was told her a1c would be sent over for dr.johnson to review please follow up

## 2018-11-01 NOTE — Telephone Encounter (Signed)
No keppra level was drawn, but regardless of keppra level, she had a significant abnormal EEG on May 07, 2018, she should continue Keppra 750 mg twice a day, no driving until seizure-free for 6 months

## 2018-11-02 NOTE — Telephone Encounter (Signed)
I called pt and relayed that she can have keppra level (trough level) drawn to check level.  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  She will come next week 11-09-18 (bring keppra tabs with her and take after she has lab drawn.  Pt understands instructions.

## 2018-11-02 NOTE — Telephone Encounter (Signed)
Agree keppra level (order placed), and ok to go back driving after seizure free for 6 months

## 2018-11-02 NOTE — Addendum Note (Signed)
Addended by: Marcial Pacas on: 11/02/2018 09:59 AM   Modules accepted: Orders

## 2018-11-03 ENCOUNTER — Telehealth: Payer: Self-pay | Admitting: Internal Medicine

## 2018-11-03 NOTE — Telephone Encounter (Signed)
Contacted pt and went over Dr. Wynetta Emery message schedule pt a televisit for 9/10

## 2018-11-03 NOTE — Telephone Encounter (Signed)
Receive note from pt's nephrologist.  Pt seen 10/19/2018.  Creat 2.10 with eGFR of 31, Vit D level of 39 and A1C of 8.1.

## 2018-11-04 ENCOUNTER — Encounter (INDEPENDENT_AMBULATORY_CARE_PROVIDER_SITE_OTHER): Payer: Self-pay

## 2018-11-09 ENCOUNTER — Other Ambulatory Visit: Payer: Self-pay

## 2018-11-09 ENCOUNTER — Other Ambulatory Visit (INDEPENDENT_AMBULATORY_CARE_PROVIDER_SITE_OTHER): Payer: Self-pay

## 2018-11-09 DIAGNOSIS — G40909 Epilepsy, unspecified, not intractable, without status epilepticus: Secondary | ICD-10-CM

## 2018-11-09 DIAGNOSIS — Z0289 Encounter for other administrative examinations: Secondary | ICD-10-CM

## 2018-11-11 ENCOUNTER — Encounter: Payer: Self-pay | Admitting: Internal Medicine

## 2018-11-11 ENCOUNTER — Ambulatory Visit: Payer: Medicaid Other | Attending: Internal Medicine | Admitting: Internal Medicine

## 2018-11-11 ENCOUNTER — Telehealth: Payer: Self-pay | Admitting: *Deleted

## 2018-11-11 ENCOUNTER — Other Ambulatory Visit: Payer: Self-pay

## 2018-11-11 DIAGNOSIS — E1165 Type 2 diabetes mellitus with hyperglycemia: Secondary | ICD-10-CM

## 2018-11-11 DIAGNOSIS — Z2821 Immunization not carried out because of patient refusal: Secondary | ICD-10-CM | POA: Diagnosis not present

## 2018-11-11 DIAGNOSIS — E118 Type 2 diabetes mellitus with unspecified complications: Secondary | ICD-10-CM | POA: Diagnosis not present

## 2018-11-11 DIAGNOSIS — IMO0002 Reserved for concepts with insufficient information to code with codable children: Secondary | ICD-10-CM

## 2018-11-11 LAB — LEVETIRACETAM LEVEL: Levetiracetam Lvl: 10.8 ug/mL (ref 10.0–40.0)

## 2018-11-11 MED ORDER — TRULICITY 0.75 MG/0.5ML ~~LOC~~ SOAJ: 0.7500 mg | SUBCUTANEOUS | 4 refills | Status: DC

## 2018-11-11 NOTE — Telephone Encounter (Signed)
It is Ok to go back driving after seizure free for 6 months

## 2018-11-11 NOTE — Telephone Encounter (Signed)
Called patient and informed her that Dr Krista Blue and Judson Roch, NP advised she may return to driving since she has reported being seizure free x 6 months. She stated she needs a letter or documentation. She then stated she would sign up for my chart and let us know if she is unable to get information needed from notes in my chart. She  verbalized understanding, appreciation.

## 2018-11-11 NOTE — Telephone Encounter (Signed)
-----   Message from Marcial Pacas, MD sent at 11/11/2018 10:36 AM EDT ----- Please call patient for normal laboratory result, keppra level is 10.8

## 2018-11-11 NOTE — Progress Notes (Signed)
Virtual Visit via Telephone Note Due to current restrictions/limitations of in-office visits due to the COVID-19 pandemic, this scheduled clinical appointment was converted to a telehealth visit  I connected with Rebecca Johnston on 11/11/18 at 8:43 a.m by telephone and verified that I am speaking with the correct person using two identifiers. I am in my office.  The patient is at home.  Only the patient and myself participated in this encounter.  I discussed the limitations, risks, security and privacy concerns of performing an evaluation and management service by telephone and the availability of in person appointments. I also discussed with the patient that there may be a patient responsible charge related to this service. The patient expressed understanding and agreed to proceed.   History of Present Illness: Pt with hx of obesity, DMwithneuropathy,CKD 3, partial SZ, ACD, Vit D def, migraine w/o aura.   DIABETES TYPE 2 Last A1C:   Results for orders placed or performed in visit on 11/09/18  Levetiracetam level  Result Value Ref Range   Levetiracetam Lvl 10.8 10.0 - 40.0 ug/mL    Med Adherence:  Medication side effects:  Not on any meds.  Patient had A1c drawn by nephrologist on 10/19/2018.  Level was 8.1. Home Monitoring?  _0  Yes  -checking BS twice a day before BF and sometimes before dinner Home glucose results range:  Before dinner range 140-180, before BF 130-165 Diet Adherence:  "not good at all."  Reports she has been eating a lot more carbs. Still drinks mainly water, some V8.  Has gained 7 lbs Exercise: _1  Yes  -"not daily but I do get it in when I can." Hypoglycemic episodes?: _2  Yes    _3  No Numbness of the feet? _4  Yes    _5  No Retinopathy hx? _6  Yes    _7  No Last eye exam:  Comments:   Outpatient Encounter Medications as of 11/11/2018  Medication Sig  . blood glucose meter kit and supplies Dispense based on patient and insurance preference. Use up to four times  daily as directed. (FOR ICD-10 E10.9, E11.9).  . butalbital-acetaminophen-caffeine (FIORICET, ESGIC) 50-325-40 MG tablet Take 1 tablet by mouth every 6 (six) hours as needed for headache.  . cetirizine (ZYRTEC) 10 MG tablet Take 1 tablet (10 mg total) by mouth daily.  . Dulaglutide (TRULICITY) 5.95 GL/8.7FI SOPN Inject 0.75 mg into the skin once a week.  . fluticasone (FLONASE) 50 MCG/ACT nasal spray Place 1 spray into both nostrils daily as needed for allergies or rhinitis.  Marland Kitchen glucose blood test strip Check blood sugars three times a day before meals  . levETIRAcetam (KEPPRA) 750 MG tablet Take 1 tablet (750 mg total) by mouth 2 (two) times daily.  Marland Kitchen topiramate (TOPAMAX) 50 MG tablet Take 1 tablet (50 mg total) by mouth 2 (two) times daily.  . Vitamin D, Ergocalciferol, (DRISDOL) 1.25 MG (50000 UT) CAPS capsule Take 50,000 Units by mouth once a week. OTC   No facility-administered encounter medications on file as of 11/11/2018.     Observations/Objective: A1c 8.1. Creatinine 2.1, GFR 31  Assessment and Plan: 1. Diabetes mellitus type 2, uncontrolled, with complications ([place]) -Advised patient that we should consider putting her on medication to get diabetes a little better control.  She wondered about being placed back on glipizide.  However I do not think that would be a good choice given her kidney function.  We discussed either daily dose of Lantus or once weekly Trulicity.  I think the latter would  be better option because it would also help get the weight down.  I went over how the medication works.  Patient is agreeable to trying the Trulicity.  We will get her in with the clinical pharmacist next week for teaching on use of Trulicity. -Plan to see her back in 1 month with blood sugar readings to see how she is doing on the medication. -Encourage patient to try to get in some form of moderate intensity exercise at least 3 to 4 days a week for 30 minutes  2. Influenza vaccination  declined Patient declined getting the flu vaccine when she sees the clinical pharmacist.   Follow Up Instructions: 1 month with me.   I discussed the assessment and treatment plan with the patient. The patient was provided an opportunity to ask questions and all were answered. The patient agreed with the plan and demonstrated an understanding of the instructions.   The patient was advised to call back or seek an in-person evaluation if the symptoms worsen or if the condition fails to improve as anticipated.  I provided 15 minutes of non-face-to-face time during this encounter.   Karle Plumber, MD

## 2018-11-11 NOTE — Telephone Encounter (Addendum)
Spoke with patient and informed her that her laboratory result was normal with keppra level of 10.8. She verbalized understanding, appreciation. She then asked that I remind Dr Krista Blue that it has been 6 months since her last seizure. 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 I advised will send this note to Dr Krista Blue to let her know. Patient verbalized understanding, appreciation.

## 2018-11-15 ENCOUNTER — Ambulatory Visit: Payer: Medicaid Other | Admitting: Obstetrics & Gynecology

## 2018-11-17 ENCOUNTER — Telehealth: Payer: Self-pay

## 2018-11-17 ENCOUNTER — Telehealth: Payer: Self-pay | Admitting: Internal Medicine

## 2018-11-17 MED ORDER — BYDUREON 2 MG ~~LOC~~ PEN: 1.0000 | PEN_INJECTOR | SUBCUTANEOUS | 2 refills | Status: DC

## 2018-11-17 NOTE — Telephone Encounter (Signed)
Patient called to check on the status of their trulicity prior auth. Please follow up.

## 2018-11-17 NOTE — Telephone Encounter (Signed)
Per telephone encounter from [place] prior authorization was requested for Trulicity, but Medicaid requires trial of Bydureon, Byetta, or Victoza first.  If appropriate, will you please change the therapy for the pt.

## 2018-11-17 NOTE — Telephone Encounter (Signed)
Returned pt call to discuss the Trulicity. Pt states she has been waiting for a week to hear back from the provider regarding the prior auth for her Trulicity. I informed pt that the provider doesn't do the prior auths for medications that we have a desgniated person doing prior auths. I informed pt that when I spoke with her last week when she had a televisit with Dr. Wynetta Emery per Dr. Wynetta Emery she is wanting pt to check with the kidney doctor to make sure that Trulicity will be okay with her taking. I informed pt that once she has spoken with her kidney doctor she will need to give [place] a call back  to let us know what they said. I informed pt that once she has received the answer from the kidney doctor to let us know so that we can do the prior auth. I informed pt that we want to hold off on doing the prior auth just in case the kidney doctor says no. Pt stated that same day she was going to call the kidney doctor and give me a call back to let me know.  During the whole conversation pt started yelling and becoming rude and cussing. Pt states she spoke with the pharmacy and they are waiting for the provider. I informed pt that the pharmacy called today and spoke with the person that does the prior auths. Pt states that she knows someone at her pharmacy which is [facility] and they told her that they have been reached out. Pt started yelling louder I informed pt that I am trying to help her but I can't do it with her yelling. Pt states we are very unprofessional and we need to have our "Asses" fired!!. Pt then started yelling again I tried to explain to the patient that Medicaid is requiring her to try another medication before Trulicity.    Pt stated yelling all over again. I explained to the pt that we can't get anywhere with the yelling. I explained to the pt that since she wants to continue to yell and be rude I am going to hang the phone up because we were not getting anywhere.

## 2018-11-17 NOTE — Telephone Encounter (Signed)
Will route this to [facility] in the pharmacy

## 2018-11-17 NOTE — Telephone Encounter (Signed)
Prescription for Bydureon sent to pharmacy

## 2018-11-17 NOTE — Telephone Encounter (Signed)
A prior authorization was requested for Trulicity, but Medicaid requires trial of Bydureon, Byetta, or Victoza first.  If appropriate, will you please change the therapy for the pt.

## 2018-11-18 ENCOUNTER — Encounter: Payer: Medicaid Other | Admitting: Pharmacist

## 2018-11-18 NOTE — Telephone Encounter (Signed)
Spoke to pt via telephone-12:42pm-pt aware that script for Bydureon was sent to pharmacy for her to replace Trulicity.  Pt asked whether or not she needed to make a new appt with Lurena Joiner since she had to cancel the last one?  I said I would ask for her and I/someone would get back to her.

## 2018-11-18 NOTE — Telephone Encounter (Signed)
3:41pm-spoke to pt, advised pt to have the pharmacist at [facility] go over medication use/instructions with her and if she felt she needed more instruction or wasn't comfortable with what [facility] pharmacist provides to schedule an appt with Lurena Joiner.  She stated she was fine with asking the [facility] Pharmacist to go over Bydureon with her.

## 2018-11-18 NOTE — Telephone Encounter (Signed)
Rx for Bydureon done

## 2018-11-19 ENCOUNTER — Ambulatory Visit: Payer: Medicaid Other

## 2018-11-19 ENCOUNTER — Telehealth: Payer: Self-pay | Admitting: Internal Medicine

## 2018-11-19 NOTE — Telephone Encounter (Signed)
Left VM to schedule follow up

## 2018-11-19 NOTE — Telephone Encounter (Signed)
-----   Message from Jackelyn Knife, [place] sent at 11/11/2018  2:27 PM EDT ----- Please contact pt and schedule a 1 month in person visit(morning)

## 2018-11-23 ENCOUNTER — Other Ambulatory Visit: Payer: Self-pay

## 2018-11-23 ENCOUNTER — Ambulatory Visit
Admission: RE | Admit: 2018-11-23 | Discharge: 2018-11-23 | Disposition: A | Discharge status: Home / Self Care | Payer: Medicaid Other | Source: Ambulatory Visit | Attending: Internal Medicine | Admitting: Internal Medicine

## 2018-11-23 DIAGNOSIS — Z1231 Encounter for screening mammogram for malignant neoplasm of breast: Secondary | ICD-10-CM | POA: Diagnosis not present

## 2018-12-01 ENCOUNTER — Telehealth: Payer: Self-pay | Admitting: Women's Health

## 2018-12-01 NOTE — Telephone Encounter (Signed)
Attempted to call patient about her appointment on 10/1 @ 8:55. No answer left voicemail instructing patient to wear a face mask for the entire appointment and no visitors are allowed during the visit. Patient instructed not to attend the appointment if she was any symptoms. Symptom list and office number left.

## 2018-12-02 ENCOUNTER — Other Ambulatory Visit: Payer: Self-pay

## 2018-12-02 ENCOUNTER — Encounter: Payer: Self-pay | Admitting: Women's Health

## 2018-12-02 ENCOUNTER — Ambulatory Visit (INDEPENDENT_AMBULATORY_CARE_PROVIDER_SITE_OTHER): Payer: Medicaid Other | Admitting: Women's Health

## 2018-12-02 ENCOUNTER — Other Ambulatory Visit (HOSPITAL_COMMUNITY)
Admission: RE | Admit: 2018-12-02 | Discharge: 2018-12-02 | Disposition: A | Discharge status: Home / Self Care | Payer: Medicaid Other | Source: Ambulatory Visit | Attending: Women's Health | Admitting: Women's Health

## 2018-12-02 DIAGNOSIS — Z113 Encounter for screening for infections with a predominantly sexual mode of transmission: Secondary | ICD-10-CM | POA: Insufficient documentation

## 2018-12-02 DIAGNOSIS — Z0000 Encounter for general adult medical examination without abnormal findings: Secondary | ICD-10-CM | POA: Diagnosis not present

## 2018-12-02 DIAGNOSIS — Z01419 Encounter for gynecological examination (general) (routine) without abnormal findings: Secondary | ICD-10-CM

## 2018-12-02 NOTE — Progress Notes (Signed)
GYNECOLOGY ANNUAL PREVENTATIVE CARE ENCOUNTER NOTE  History:     Rebecca Johnston is a 48 y.o. [id number] female here for a routine annual gynecologic exam.  Current complaints: "a little discharge" that has been going on for "a little bit." Pt is unable to specify a timeframe. Pt reports mild itching on outside. Pt reports douching. Denies abnormal vaginal bleeding, pelvic pain, problems with intercourse or other gynecologic concerns.    Pt denies family hx of breast, colon, uterine, ovarian cancer.   Gynecologic History Patient's last menstrual period was 11/26/2014 (exact date). Contraception: hysterectomy for fibroids/bleeding, unsure if cervix was removed, pt reports no history of abnormal Pap. Per pathology report from 12/2014, cervix was received post-op and processed, normal. Last Pap: 2015. Results were: normal. Last mammogram: 11/23/2018. Results were: normal  Obstetric History OB History  Gravida Para Term Preterm AB Living  1 0 0 0 1 0  SAB TAB Ectopic Multiple Live Births  0 0 1 0      # Outcome Date GA Lbr Len/2nd Weight Sex Delivery Anes PTL Lv  1 Ectopic             Past Medical History:  Diagnosis Date  . Anemia   . Asthma   . Diabetes mellitus without complication ([place]) 02/4579  . Fibroids   . GERD (gastroesophageal reflux disease)   . History of blood transfusion Feb 2015  . Obesity, Class III, BMI 40-49.9 (morbid obesity) ([place])   . Shortness of breath dyspnea     Past Surgical History:  Procedure Laterality Date  . ABDOMINAL HYSTERECTOMY N/A 12/08/2014   Procedure: HYSTERECTOMY ABDOMINAL;  Surgeon: Woodroe Mode, MD;  Location: [facility];  Service: Gynecology;  Laterality: N/A;  . DILATION AND CURETTAGE OF UTERUS    . DILITATION & CURRETTAGE/HYSTROSCOPY WITH VERSAPOINT RESECTION N/A 01/06/2014   Procedure: Corena Pilgrim WITH VERSAPOINT;  Surgeon: Woodroe Mode, MD;  Location: [facility];  Service: Gynecology;  Laterality: N/A;  . LAPAROSCOPIC LYSIS OF  ADHESIONS N/A 12/08/2014   Procedure: LAPAROSCOPIC LYSIS OF ADHESIONS;  Surgeon: Michael Boston, MD;  Location: [facility];  Service: General;  Laterality: N/A;  . OVARIAN CYST REMOVAL     cyst not removed. Cyst drained  . SUPRA-UMBILICAL HERNIA N/A 99/10/3380   Procedure: LAPARSCOPIC SUPRA-UMBILICAL HERNIA REPAIR ;  Surgeon: Michael Boston, MD;  Location: [facility];  Service: General;  Laterality: N/A;  . VENTRAL HERNIA REPAIR N/A 12/08/2014   Procedure: LAPAROSCOPIC INCARCERATED INCISIONAL VENTRAL WALL HERNIA REPAIR;  Surgeon: Michael Boston, MD;  Location: [facility];  Service: General;  Laterality: N/A;    Current Outpatient Medications on File Prior to Visit  Medication Sig Dispense Refill  . blood glucose meter kit and supplies Dispense based on patient and insurance preference. Use up to four times daily as directed. (FOR ICD-10 E10.9, E11.9). 1 each 0  . butalbital-acetaminophen-caffeine (FIORICET, ESGIC) 50-325-40 MG tablet Take 1 tablet by mouth every 6 (six) hours as needed for headache. 20 tablet 0  . cetirizine (ZYRTEC) 10 MG tablet Take 1 tablet (10 mg total) by mouth daily. 30 tablet 6  . cholecalciferol (VITAMIN D3) 25 MCG (1000 UT) tablet Take 5,000 Units by mouth daily.    . fluticasone (FLONASE) 50 MCG/ACT nasal spray Place 1 spray into both nostrils daily as needed for allergies or rhinitis. 16 g 3  . glucose blood test strip Check blood sugars three times a day before meals 100 each 12  . levETIRAcetam (KEPPRA) 750 MG  tablet Take 1 tablet (750 mg total) by mouth 2 (two) times daily. 180 tablet 4  . topiramate (TOPAMAX) 50 MG tablet Take 1 tablet (50 mg total) by mouth 2 (two) times daily. 60 tablet 3  . Dulaglutide (TRULICITY) 3.61 QA/4.4LP SOPN Inject 0.75 mg into the skin once a week. 4 pen 4  . Exenatide ER (BYDUREON) 2 MG PEN Inject 1 each into the skin once a week. 4 each 2  . Vitamin D, Ergocalciferol, (DRISDOL) 1.25 MG (50000 UT) CAPS capsule Take 50,000 Units by mouth once a week. OTC      No current facility-administered medications on file prior to visit.     Allergies  Allergen Reactions  . Doxycycline     "bad dreaming, hallucination, dizziness" per pt    Social History:  reports that she quit smoking about 3 years ago. Her smoking use included cigarettes. She has a 6.25 pack-year smoking history. She has never used smokeless tobacco. She reports previous alcohol use. She reports that she does not use drugs.  Family History  Problem Relation Age of Onset  . Hypertension Mother   . Diabetes Father   . Breast cancer Maternal Aunt   . Diabetes Maternal Aunt   . Diabetes Paternal Aunt   . Diabetes Maternal Aunt   . Diabetes Maternal Aunt     The following portions of the patient's history were reviewed and updated as appropriate: allergies, current medications, past family history, past medical history, past social history, past surgical history and problem list.  Review of Systems Pertinent items noted in HPI and remainder of comprehensive ROS otherwise negative.  Physical Exam:  LMP 11/26/2014 (Exact Date)  CONSTITUTIONAL: Well-developed, well-nourished female in no acute distress.  HENT:  Normocephalic, atraumatic, External right and left ear normal. Oropharynx is clear and moist EYES: Conjunctivae and EOM are normal. Pupils are equal, round, and reactive to light. No scleral icterus.  NECK: Normal range of motion, supple, no masses.  Normal thyroid.  SKIN: Skin is warm and dry. No rash noted. Not diaphoretic. No erythema. No pallor. MUSCULOSKELETAL: Normal range of motion. No tenderness.  No cyanosis, clubbing, or edema.  2+ distal pulses. NEUROLOGIC: Alert and oriented to person, place, and time. Normal reflexes, muscle tone coordination. No cranial nerve deficit noted. PSYCHIATRIC: Normal mood and affect. Normal behavior. Normal judgment and thought content. CARDIOVASCULAR: Normal heart rate noted, regular rhythm RESPIRATORY: Clear to auscultation  bilaterally. Effort and breath sounds normal, no problems with respiration noted. BREASTS: pt declines exam. ABDOMEN: Soft, normal bowel sounds, no distention noted.  No tenderness, rebound or guarding.  PELVIC: Normal appearing external genitalia; normal appearing vaginal mucosa and cervix.  Minimal thick, white, clumped discharge. Uterus and cervix surgically absent. No adnexal tenderness.   Assessment and Plan:       1. Encounter for well woman exam with routine gynecological exam - discussed appropriate vaginal and vulvar hygiene, discouraged douching  2. Screening for STD (sexually transmitted disease) - Cervicovaginal ancillary only( [place]) - HIV antibody (with reflex) - Hepatitis B Surface AntiGEN - Hepatitis C Antibody - RPR  Will follow up test results and manage accordingly. Mammogram completed 11/23/2018 - BIRADS 1. Routine preventative health maintenance measures emphasized. Please refer to After Visit Summary for other counseling recommendations.     Clarisa Fling, NP  12:51 PM 12/02/2018

## 2018-12-02 NOTE — Patient Instructions (Signed)
Bacterial Vaginosis  Bacterial vaginosis is a vaginal infection that occurs when the normal balance of bacteria in the vagina is disrupted. It results from an overgrowth of certain bacteria. This is the most common vaginal infection among women ages 15-44. Because bacterial vaginosis increases your risk for STIs (sexually transmitted infections), getting treated can help reduce your risk for chlamydia, gonorrhea, herpes, and HIV (human immunodeficiency virus). Treatment is also important for preventing complications in pregnant women, because this condition can cause an early (premature) delivery. What are the causes? This condition is caused by an increase in harmful bacteria that are normally present in small amounts in the vagina. However, the reason that the condition develops is not fully understood. What increases the risk? The following factors may make you more likely to develop this condition:  Having a new sexual partner or multiple sexual partners.  Having unprotected sex.  Douching.  Having an intrauterine device (IUD).  Smoking.  Drug and alcohol abuse.  Taking certain antibiotic medicines.  Being pregnant. You cannot get bacterial vaginosis from toilet seats, bedding, swimming pools, or contact with objects around you. What are the signs or symptoms? Symptoms of this condition include:  Grey or white vaginal discharge. The discharge can also be watery or foamy.  A fish-like odor with discharge, especially after sexual intercourse or during menstruation.  Itching in and around the vagina.  Burning or pain with urination. Some women with bacterial vaginosis have no signs or symptoms. How is this diagnosed? This condition is diagnosed based on:  Your medical history.  A physical exam of the vagina.  Testing a sample of vaginal fluid under a microscope to look for a large amount of bad bacteria or abnormal cells. Your health care provider may use a cotton swab or  a small wooden spatula to collect the sample. How is this treated? This condition is treated with antibiotics. These may be given as a pill, a vaginal cream, or a medicine that is put into the vagina (suppository). If the condition comes back after treatment, a second round of antibiotics may be needed. Follow these instructions at home: Medicines  Take over-the-counter and prescription medicines only as told by your health care provider.  Take or use your antibiotic as told by your health care provider. Do not stop taking or using the antibiotic even if you start to feel better. General instructions  If you have a female sexual partner, tell her that you have a vaginal infection. She should see her health care provider and be treated if she has symptoms. If you have a female sexual partner, he does not need treatment.  During treatment: ? Avoid sexual activity until you finish treatment. ? Do not douche. ? Avoid alcohol as directed by your health care provider. ? Avoid breastfeeding as directed by your health care provider.  Drink enough water and fluids to keep your urine clear or pale yellow.  Keep the area around your vagina and rectum clean. ? Wash the area daily with warm water. ? Wipe yourself from front to back after using the toilet.  Keep all follow-up visits as told by your health care provider. This is important. How is this prevented?  Do not douche.  Wash the outside of your vagina with warm water only.  Use protection when having sex. This includes latex condoms and dental dams.  Limit how many sexual partners you have. To help prevent bacterial vaginosis, it is best to have sex with just one partner (  monogamous).  Make sure you and your sexual partner are tested for STIs.  Wear cotton or cotton-lined underwear.  Avoid wearing tight pants and pantyhose, especially during summer.  Limit the amount of alcohol that you drink.  Do not use any products that contain  nicotine or tobacco, such as cigarettes and e-cigarettes. If you need help quitting, ask your health care provider.  Do not use illegal drugs. Where to find more information  Centers for Disease Control and Prevention: www.cdc.gov/std  American Sexual Health Association (ASHA): www.ashastd.org  U.S. Department of Health and Human Services, Office on Women's Health: www.womenshealth.gov/ or https://www.womenshealth.gov/a-z-topics/bacterial-vaginosis Contact a health care provider if:  Your symptoms do not improve, even after treatment.  You have more discharge or pain when urinating.  You have a fever.  You have pain in your abdomen.  You have pain during sex.  You have vaginal bleeding between periods. Summary  Bacterial vaginosis is a vaginal infection that occurs when the normal balance of bacteria in the vagina is disrupted.  Because bacterial vaginosis increases your risk for STIs (sexually transmitted infections), getting treated can help reduce your risk for chlamydia, gonorrhea, herpes, and HIV (human immunodeficiency virus). Treatment is also important for preventing complications in pregnant women, because the condition can cause an early (premature) delivery.  This condition is treated with antibiotic medicines. These may be given as a pill, a vaginal cream, or a medicine that is put into the vagina (suppository). This information is not intended to replace advice given to you by your health care provider. Make sure you discuss any questions you have with your health care provider. Document Released: 02/17/2005 Document Revised: 01/30/2017 Document Reviewed: 11/03/2015 Elsevier Patient Education  2020 Elsevier Inc.  

## 2018-12-03 LAB — RPR: RPR Ser Ql: NONREACTIVE

## 2018-12-03 LAB — HEPATITIS C ANTIBODY: Hep C Virus Ab: 0.2 s/co ratio (ref 0.0–0.9)

## 2018-12-03 LAB — HEPATITIS B SURFACE ANTIGEN: Hepatitis B Surface Ag: NEGATIVE

## 2018-12-03 LAB — HIV ANTIBODY (ROUTINE TESTING W REFLEX): HIV Screen 4th Generation wRfx: NONREACTIVE

## 2018-12-06 LAB — CERVICOVAGINAL ANCILLARY ONLY
Bacterial Vaginitis (gardnerella): NEGATIVE
Candida Glabrata: NEGATIVE
Candida Vaginitis: POSITIVE — AB
Chlamydia: NEGATIVE
Neisseria Gonorrhea: NEGATIVE
Trichomonas: NEGATIVE

## 2018-12-09 ENCOUNTER — Telehealth: Payer: Self-pay

## 2018-12-09 DIAGNOSIS — B379 Candidiasis, unspecified: Secondary | ICD-10-CM

## 2018-12-09 MED ORDER — TERCONAZOLE 0.4 % VA CREA: 1.0000 | TOPICAL_CREAM | Freq: Every day | VAGINAL | 0 refills | Status: DC

## 2018-12-09 NOTE — Telephone Encounter (Signed)
Pt left message on nurse VM requesting a call back.   Called pt. Pt reports positive yeast infection result from appt on 12/02/18. Vernice Jefferson, NP notified pt of results over MyChart; pt was able to see message but had difficulty responding. Pt requesting rx for yeast infection; Terconazole ordered per protocol. Administration instructions given. Pt verbalizes understanding and has no further questions.

## 2019-02-07 ENCOUNTER — Telehealth: Payer: Self-pay | Admitting: Neurology

## 2019-02-07 NOTE — Telephone Encounter (Signed)
LVM to r/s 2/22 appt due to NP being out

## 2019-02-08 NOTE — Telephone Encounter (Signed)
I spoke to pt.  I relayed she is to continue taking her keppra for seizures.  These medications are usually for a lifetime.  (when she see's SS/NP in feb 2021, they may discuss questions relating to discontinuing) if appropriate.  She verbalized understanding.

## 2019-02-08 NOTE — Telephone Encounter (Signed)
R/s pts appt with Np- while on the phone pt requested a call from RN stating she is out of medication levETIRAcetam (KEPPRA) 750 MG tablet and is wanting to know if she will still need to take this medication.

## 2019-04-01 ENCOUNTER — Other Ambulatory Visit: Payer: Self-pay | Admitting: Family Medicine

## 2019-04-15 DIAGNOSIS — N183 Chronic kidney disease, stage 3 unspecified: Secondary | ICD-10-CM | POA: Diagnosis not present

## 2019-04-18 ENCOUNTER — Other Ambulatory Visit: Payer: Self-pay

## 2019-04-18 ENCOUNTER — Telehealth (INDEPENDENT_AMBULATORY_CARE_PROVIDER_SITE_OTHER): Payer: Medicaid Other | Admitting: Neurology

## 2019-04-18 ENCOUNTER — Encounter: Payer: Self-pay | Admitting: Neurology

## 2019-04-18 DIAGNOSIS — R569 Unspecified convulsions: Secondary | ICD-10-CM

## 2019-04-18 DIAGNOSIS — G40909 Epilepsy, unspecified, not intractable, without status epilepticus: Secondary | ICD-10-CM | POA: Diagnosis not present

## 2019-04-18 MED ORDER — LEVETIRACETAM 750 MG PO TABS: 750.0000 mg | ORAL_TABLET | Freq: Two times a day (BID) | ORAL | 1 refills | Status: DC

## 2019-04-18 NOTE — Progress Notes (Signed)
Virtual Visit via Video Note  I connected with Rebecca Johnston on 04/18/19 at  1:15 PM EST by a video enabled telemedicine application and verified that I am speaking with the correct person using two identifiers.  Location: Patient: at her home  Provider: in the office   I discussed the limitations of evaluation and management by telemedicine and the availability of in person appointments. The patient expressed understanding and agreed to proceed.  History of Present Illness: HISTORY  Rebecca Johnston a 49 year old female, seen in request byher primary care physician Dr. Wynetta Emery, Neoma Laming Johnston,for seizure.  I have reviewed and summarized the referring note from the referring physician. She had a past medical history of diabetes, quit smoking 2 years ago, chronic kidney disease, GERD, morbid obesity with MBI of 42, she was working as Neurosurgeon, which required driving to community.  She presented to [facility] on May 06, 2018 for headaches, blurred vision, dizziness, she described headache at the right temporal region, was noted to have some slurred speech, facial droop, with occasionally nausea, vomiting, she was treated for community-acquired pneumonia on May 28, 2018 because persistent nasal congestion, was treated with doxycycline, I personally reviewed CT and MRI of the brain showed no significant abnormality  CT angiogram of head neck was negative  UDS was negative, glucose on admission was elevated 355, hyponatremia of 127, bicarb was low at 18, she was given Reglan and Compazine for nausea, later she was noted to have eye deviation, suspected for seizure, she was loaded with Dilantin then Keppra, and the Lyrica, EEG showed 2 electrographic seizure activity emanating out of the right temporal region, spreading mostly to the entire right side, was seen by neuro hospitalist, contributed her partial seizureto hypoglycemia. 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 She was very sick in the hospital with PNA, was prescribed doxycycline, got very sick, allergic reaction. She has done well, no recurrent seizures. 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 Is not taking any diabetes medications. She follows with [facility].   Update April 18, 2019 SS: She did not sign on for my chart visit, we did a telephone visit.  She indicates she has been doing great.  She remains on Keppra.  She has not had recurrent seizure.  She is also taking Topamax for headache.  She has returned to driving.  She is wondering whether she will have to be on Keppra long-term.  Last A1c of record in August 2020 was 8.1.  In September 2020, Keppra level was 10.8.  Observations/Objective: Via telephone visit, is alert and oriented, speech is clear and concise, excellent historian, pleasant  Assessment and Plan: 1.  Partial seizures on 05/06/2018 -The event occurred while hospitalized, intubated, poorly controlled diabetes A1c was 14.9  -MRI of the brain was normal, EEG 05/07/2018 showed 2 electrographic seizure emanating out of the right temporal region, spreading to the entire right  side  -Repeat EEG in June 2020 was normal  -She has not had recurrent seizure, has remained on Keppra 750 mg twice a day, Keppra level was 10.8 in September 2020  -She will remain on Keppra at current  dose, will have her follow-up in 6 months with Rebecca Johnston, she wants to discuss whether she will have to remain on Keppra long-term, initial EEG was abnormal in the setting of severe illness, repeat was normal. Over the next 6 months, we will follow for any recurrent events.   Follow Up Instructions: 6 months with Rebecca Johnston 10/11/2019 10:00   I discussed the assessment and treatment plan with the patient. The patient was provided an opportunity to ask questions and all were answered. The patient agreed with the plan and demonstrated an understanding of the instructions.   The patient was advised to call back or seek an in-person evaluation if the symptoms worsen or if the condition fails to improve as anticipated.  I provided 15 minutes of non-face-to-face time during this encounter.  Evangeline Dakin, DNP  [facility] [address] [place], [place] [address] [phone]

## 2019-04-20 DIAGNOSIS — N183 Chronic kidney disease, stage 3 unspecified: Secondary | ICD-10-CM | POA: Diagnosis not present

## 2019-04-20 DIAGNOSIS — E1129 Type 2 diabetes mellitus with other diabetic kidney complication: Secondary | ICD-10-CM | POA: Diagnosis not present

## 2019-04-20 DIAGNOSIS — E872 Acidosis: Secondary | ICD-10-CM | POA: Diagnosis not present

## 2019-04-20 DIAGNOSIS — Z862 Personal history of diseases of the blood and blood-forming organs and certain disorders involving the immune mechanism: Secondary | ICD-10-CM | POA: Diagnosis not present

## 2019-04-20 DIAGNOSIS — Z6841 Body Mass Index (BMI) 40.0 and over, adult: Secondary | ICD-10-CM | POA: Diagnosis not present

## 2019-04-20 DIAGNOSIS — G40909 Epilepsy, unspecified, not intractable, without status epilepticus: Secondary | ICD-10-CM | POA: Diagnosis not present

## 2019-04-20 NOTE — Progress Notes (Signed)
I have reviewed and agreed above plan. 

## 2019-04-25 ENCOUNTER — Ambulatory Visit: Payer: Medicaid Other | Admitting: Neurology

## 2019-05-05 ENCOUNTER — Other Ambulatory Visit: Payer: Self-pay | Admitting: Internal Medicine

## 2019-05-09 ENCOUNTER — Other Ambulatory Visit: Payer: Self-pay

## 2019-05-09 ENCOUNTER — Ambulatory Visit: Payer: Medicaid Other | Attending: Internal Medicine | Admitting: Internal Medicine

## 2019-05-09 ENCOUNTER — Encounter: Payer: Self-pay | Admitting: Internal Medicine

## 2019-05-09 VITALS — BP 120/84 | HR 70 | Temp 97.3°F | Resp 16 | Wt 260.6 lb

## 2019-05-09 DIAGNOSIS — R0789 Other chest pain: Secondary | ICD-10-CM

## 2019-05-09 DIAGNOSIS — Z79899 Other long term (current) drug therapy: Secondary | ICD-10-CM | POA: Diagnosis not present

## 2019-05-09 DIAGNOSIS — R030 Elevated blood-pressure reading, without diagnosis of hypertension: Secondary | ICD-10-CM | POA: Diagnosis not present

## 2019-05-09 DIAGNOSIS — Z760 Encounter for issue of repeat prescription: Secondary | ICD-10-CM | POA: Insufficient documentation

## 2019-05-09 DIAGNOSIS — E1121 Type 2 diabetes mellitus with diabetic nephropathy: Secondary | ICD-10-CM | POA: Diagnosis not present

## 2019-05-09 DIAGNOSIS — F17200 Nicotine dependence, unspecified, uncomplicated: Secondary | ICD-10-CM | POA: Diagnosis not present

## 2019-05-09 DIAGNOSIS — N183 Chronic kidney disease, stage 3 unspecified: Secondary | ICD-10-CM | POA: Diagnosis not present

## 2019-05-09 DIAGNOSIS — E1165 Type 2 diabetes mellitus with hyperglycemia: Secondary | ICD-10-CM | POA: Diagnosis not present

## 2019-05-09 DIAGNOSIS — D631 Anemia in chronic kidney disease: Secondary | ICD-10-CM | POA: Insufficient documentation

## 2019-05-09 DIAGNOSIS — G40909 Epilepsy, unspecified, not intractable, without status epilepticus: Secondary | ICD-10-CM

## 2019-05-09 DIAGNOSIS — E1122 Type 2 diabetes mellitus with diabetic chronic kidney disease: Secondary | ICD-10-CM | POA: Diagnosis not present

## 2019-05-09 DIAGNOSIS — Z87891 Personal history of nicotine dependence: Secondary | ICD-10-CM | POA: Insufficient documentation

## 2019-05-09 DIAGNOSIS — N1832 Chronic kidney disease, stage 3b: Secondary | ICD-10-CM | POA: Insufficient documentation

## 2019-05-09 DIAGNOSIS — Z6841 Body Mass Index (BMI) 40.0 and over, adult: Secondary | ICD-10-CM | POA: Diagnosis not present

## 2019-05-09 DIAGNOSIS — K219 Gastro-esophageal reflux disease without esophagitis: Secondary | ICD-10-CM | POA: Diagnosis not present

## 2019-05-09 DIAGNOSIS — G43109 Migraine with aura, not intractable, without status migrainosus: Secondary | ICD-10-CM | POA: Diagnosis not present

## 2019-05-09 DIAGNOSIS — E669 Obesity, unspecified: Secondary | ICD-10-CM | POA: Diagnosis not present

## 2019-05-09 DIAGNOSIS — F17210 Nicotine dependence, cigarettes, uncomplicated: Secondary | ICD-10-CM | POA: Diagnosis not present

## 2019-05-09 DIAGNOSIS — E559 Vitamin D deficiency, unspecified: Secondary | ICD-10-CM | POA: Insufficient documentation

## 2019-05-09 DIAGNOSIS — R079 Chest pain, unspecified: Secondary | ICD-10-CM

## 2019-05-09 DIAGNOSIS — E118 Type 2 diabetes mellitus with unspecified complications: Secondary | ICD-10-CM | POA: Diagnosis not present

## 2019-05-09 HISTORY — DX: Elevated blood-pressure reading, without diagnosis of hypertension: R03.0

## 2019-05-09 LAB — POCT GLYCOSYLATED HEMOGLOBIN (HGB A1C): HbA1c, POC (prediabetic range): 5.7 % (ref 5.7–6.4)

## 2019-05-09 LAB — GLUCOSE, POCT (MANUAL RESULT ENTRY): POC Glucose: 160 mg/dl — AB (ref 70–99)

## 2019-05-09 MED ORDER — BYDUREON 2 MG ~~LOC~~ PEN: 2.0000 mg | PEN_INJECTOR | SUBCUTANEOUS | 6 refills | Status: DC

## 2019-05-09 NOTE — Progress Notes (Signed)
Patient ID: Rebecca Johnston, female    DOB: [birth year]  MRN: [id number]  CC: Medication Refill   Subjective: Rebecca Johnston is a 49 y.o. female who presents for chronic ds management Her concerns today include:  Pt with hx of obesity, DMwithneuropathy,CKD 3, partial SZ, ACD, Vit D def, migraine w/o aura.  DIABETES TYPE 2 Last A1C:   A1C 5.7/BS 160 Med Adherence:  _0  Yes -on last visit we had plan to put her on Trulicity but her insurance paid for exenatide instead.   _1  No Medication side effects:  _2  Yes -nausea at times   _3  No Home Monitoring?  _4  Yes  daily Home glucose results range:90-120 Diet Adherence: _5  Yes   -she has cut back on white carbs.  Drinks sugar free drinks.  Wgh was up to 278 lbs with neph 6 mths Exercise: _6  Yes    _7  No -scared to get out due to COVID pandemic Hypoglycemic episodes?: _8  Yes    _9  No Numbness of the feet? _10  Yes    _11  No Retinopathy hx? _12  Yes    _13  No Last eye exam: last eye exam was 03/2018 at [facility]. Comments:   SZ:  No sz since last visit.  She remains on Keppra.  She follows with neurology.  CKD 3:  Saw nephrologist last mth.  Told kidney function stable.   Complains of some intermittent substernal chest pain over the past 2 weeks.  Pains can occur with rest or with exertion.  Sometimes the pain radiates to the left arm.  No associated shortness of breath.  She smokes 2 to 3 cigarettes a day.  She has smoked for the past 10 years.  No family history of heart disease. Patient Active Problem List   Diagnosis Date Noted  . Anemia due to stage 3 chronic kidney disease 07/27/2018  . Anemia of chronic disease 06/17/2018  . Vitamin D deficiency 06/15/2018  . Class 3 severe obesity due to excess calories with serious comorbidity and body mass index (BMI) of 40.0 to 44.9 in adult ([place]) 06/15/2018  . Seizure disorder ([place]) 05/14/2018  . GERD (gastroesophageal reflux disease) 05/07/2018  . CKD (chronic kidney disease),  stage III 04/16/2017  . Neuropathy 04/04/2016  . Seasonal allergic rhinitis 12/21/2015  . Type II diabetes mellitus with renal manifestations ([place]) 12/21/2015  . Incarcerated hernia s/p lap repair w mesh 12/08/2014 12/08/2014  . Abnormal CT scan, chest 09/02/2013     Current Outpatient Medications on File Prior to Visit  Medication Sig Dispense Refill  . blood glucose meter kit and supplies Dispense based on patient and insurance preference. Use up to four times daily as directed. (FOR ICD-10 E10.9, E11.9). 1 each 0  . butalbital-acetaminophen-caffeine (FIORICET, ESGIC) 50-325-40 MG tablet Take 1 tablet by mouth every 6 (six) hours as needed for headache. 20 tablet 0  . cetirizine (ZYRTEC) 10 MG tablet Take 1 tablet (10 mg total) by mouth daily. 30 tablet 6  . cholecalciferol (VITAMIN D3) 25 MCG (1000 UT) tablet Take 5,000 Units by mouth daily.    . fluticasone (FLONASE) 50 MCG/ACT nasal spray Place 1 spray into both nostrils daily as needed for allergies or rhinitis. 16 g 3  . glucose blood test strip Check blood sugars three times a day before meals 100 each 12  . levETIRAcetam (KEPPRA) 750 MG tablet Take 1 tablet (750 mg total) by mouth 2 (two) times daily. 180 tablet 1  . terconazole (TERAZOL 7) 0.4 %  vaginal cream Place 1 applicator vaginally at bedtime. 45 g 0  . topiramate (TOPAMAX) 50 MG tablet Take 1 tablet (50 mg total) by mouth 2 (two) times daily. 60 tablet 3  . Vitamin D, Ergocalciferol, (DRISDOL) 1.25 MG (50000 UT) CAPS capsule Take 50,000 Units by mouth once a week. OTC     No current facility-administered medications on file prior to visit.    Allergies  Allergen Reactions  . Doxycycline     "bad dreaming, hallucination, dizziness" per pt    Social History   Socioeconomic History  . Marital status: Single    Spouse name: Not on file  . Number of children: Not on file  . Years of education: Not on file  . Highest education level: Not on file  Occupational History    . Not on file  Tobacco Use  . Smoking status: Former Smoker    Packs/day: 0.25    Years: 25.00    Pack years: 6.25    Types: Cigarettes    Quit date: 08/02/2015    Years since quitting: 3.7  . Smokeless tobacco: Never Used  Substance and Sexual Activity  . Alcohol use: Not Currently    Alcohol/week: 0.0 standard drinks    Comment: socially  . Drug use: No  . Sexual activity: Not Currently    Birth control/protection: None  Other Topics Concern  . Not on file  Social History Narrative  . Not on file   Social Determinants of Health   Financial Resource Strain:   . Difficulty of Paying Living Expenses: Not on file  Food Insecurity:   . Worried About Charity fundraiser in the Last Year: Not on file  . Ran Out of Food in the Last Year: Not on file  Transportation Needs:   . Lack of Transportation (Medical): Not on file  . Lack of Transportation (Non-Medical): Not on file  Physical Activity:   . Days of Exercise per Week: Not on file  . Minutes of Exercise per Session: Not on file  Stress:   . Feeling of Stress : Not on file  Social Connections:   . Frequency of Communication with Friends and Family: Not on file  . Frequency of Social Gatherings with Friends and Family: Not on file  . Attends Religious Services: Not on file  . Active Member of Clubs or Organizations: Not on file  . Attends Archivist Meetings: Not on file  . Marital Status: Not on file  Intimate Partner Violence:   . Fear of Current or Ex-Partner: Not on file  . Emotionally Abused: Not on file  . Physically Abused: Not on file  . Sexually Abused: Not on file    Family History  Problem Relation Age of Onset  . Hypertension Mother   . Diabetes Father   . Breast cancer Maternal Aunt   . Diabetes Maternal Aunt   . Diabetes Paternal Aunt   . Diabetes Maternal Aunt   . Diabetes Maternal Aunt     Past Surgical History:  Procedure Laterality Date  . ABDOMINAL HYSTERECTOMY N/A 12/08/2014    Procedure: HYSTERECTOMY ABDOMINAL;  Surgeon: Woodroe Mode, MD;  Location: [facility];  Service: Gynecology;  Laterality: N/A;  . DILATION AND CURETTAGE OF UTERUS    . DILITATION & CURRETTAGE/HYSTROSCOPY WITH VERSAPOINT RESECTION N/A 01/06/2014   Procedure: Corena Pilgrim WITH VERSAPOINT;  Surgeon: Woodroe Mode, MD;  Location: [facility];  Service: Gynecology;  Laterality: N/A;  . LAPAROSCOPIC LYSIS OF ADHESIONS  N/A 12/08/2014   Procedure: LAPAROSCOPIC LYSIS OF ADHESIONS;  Surgeon: Michael Boston, MD;  Location: [facility];  Service: General;  Laterality: N/A;  . OVARIAN CYST REMOVAL     cyst not removed. Cyst drained  . SUPRA-UMBILICAL HERNIA N/A 33/06/3566   Procedure: LAPARSCOPIC SUPRA-UMBILICAL HERNIA REPAIR ;  Surgeon: Michael Boston, MD;  Location: [facility];  Service: General;  Laterality: N/A;  . VENTRAL HERNIA REPAIR N/A 12/08/2014   Procedure: LAPAROSCOPIC INCARCERATED INCISIONAL VENTRAL WALL HERNIA REPAIR;  Surgeon: Michael Boston, MD;  Location: [facility];  Service: General;  Laterality: N/A;    ROS: Review of Systems Negative except as stated above  PHYSICAL EXAM: BP 123/85   Pulse 70   Temp (!) 97.3 F (36.3 C)   Resp 16   Wt 260 lb 9.6 oz (118.2 kg)   LMP 11/26/2014 (Exact Date)   SpO2 97%   BMI 43.37 kg/m   Wt Readings from Last 3 Encounters:  05/09/19 260 lb 9.6 oz (118.2 kg)  10/19/18 270 lb 9.6 oz (122.7 kg)  06/15/18 259 lb 6.4 oz (117.7 kg)    Physical Exam  General appearance - alert, well appearing, middle-age obese African-American female and in no distress Mental status - normal mood, behavior, speech, dress, motor activity, and thought processes Neck - supple, no significant adenopathy Chest - clear to auscultation, no wheezes, rales or rhonchi, symmetric air entry Heart - normal rate, regular rhythm, normal S1, S2, no murmurs, rubs, clicks or gallops Extremities - peripheral pulses normal, no pedal edema, no clubbing or cyanosis Diabetic Foot Exam - Simple   Simple Foot  Form Visual Inspection See comments: Yes Sensation Testing See comments: Yes Pulse Check Posterior Tibialis and Dorsalis pulse intact bilaterally: Yes Comments Mild flat-footed.  No ulcers or calluses on the feet.     CMP Latest Ref Rng & Units 05/25/2018 05/21/2018 05/20/2018  Glucose 70 - 99 mg/dL 113(H) 127(H) 124(H)  BUN 6 - 20 mg/dL _0 Creatinine 0.44 - 1.00 mg/dL 1.76(H) 1.68(H) 1.74(H)  Sodium 135 - 145 mmol/L 138 135 134(L)  Potassium 3.5 - 5.1 mmol/L 4.4 4.3 4.5  Chloride 98 - 111 mmol/L 105 106 103  CO2 22 - 32 mmol/L 20(L) 21(L) 21(L)  Calcium 8.9 - 10.3 mg/dL 9.3 9.1 9.2  Total Protein 6.5 - 8.1 g/dL - - -  Total Bilirubin 0.3 - 1.2 mg/dL - - -  Alkaline Phos 38 - 126 U/L - - -  AST 15 - 41 U/L - - -  ALT 0 - 44 U/L - - -   Lipid Panel     Component Value Date/Time   CHOL 197 04/16/2017 1501   TRIG 320 (H) 05/09/2018 2049   HDL 39 (L) 04/16/2017 1501   CHOLHDL 5.1 (H) 04/16/2017 1501   CHOLHDL 4.2 12/21/2015 1019   VLDL 34 (H) 12/21/2015 1019   LDLCALC 97 04/16/2017 1501    CBC    Component Value Date/Time   WBC 7.2 06/15/2018 1053   WBC 6.7 05/25/2018 0518   RBC 5.25 06/15/2018 1053   RBC 4.58 05/25/2018 0518   HGB 11.6 06/15/2018 1053   HCT 37.2 06/15/2018 1053   PLT 314 06/15/2018 1053   MCV 71 (L) 06/15/2018 1053   MCH 22.1 (L) 06/15/2018 1053   MCH 21.6 (L) 05/25/2018 0518   MCHC 31.2 (L) 06/15/2018 1053   MCHC 29.3 (L) 05/25/2018 0518   RDW 16.3 (H) 06/15/2018 1053   LYMPHSABS 1.4 05/17/2018 0640  MONOABS 0.7 05/17/2018 0640   EOSABS 0.2 05/17/2018 0640   BASOSABS 0.0 05/17/2018 0640   EKG normal sinus rhythm.  No acute ischemic changes.  Appears unchanged from EKG done 05/2018.   ASSESSMENT AND PLAN:  1. Type 2 diabetes mellitus with stage 3b chronic kidney disease, without long-term current use of insulin (HCC) At goal.  Continue healthy eating habits.  Encouraged her to get in some form of moderate intensity exercise with the  goal of at least 150 minutes/week total. -Advised patient of the importance of getting eye exam done at least once a year.  She is overdue.  She plans to call and schedule an eye appointment with [facility] - Exenatide ER (BYDUREON) 2 MG PEN; Inject 2 mg into the skin once a week. Must have office visit for refills  Dispense: 4 each; Refill: 6  2. Type 2 diabetes mellitus with stage 3 chronic kidney disease, without long-term current use of insulin (HCC) Followed by nephrology - Lipid panel - Comprehensive metabolic panel  3. Anemia due to stage 3 chronic kidney disease - Comprehensive metabolic panel - CBC  4. Class 3 severe obesity due to excess calories with serious comorbidity and body mass index (BMI) of 40.0 to 44.9 in adult [facility]) See #1 above  5. Seizure disorder ([place]) Continue Keppra.  Followed by neurology  6. Chest pain in adult Patient with risk factors for heart disease.  Will refer to cardiology for further evaluation.  Recommend taking a baby aspirin every day - EKG 12-Lead - Ambulatory referral to Cardiology  7. Tobacco dependence Advised to quit.  Discussed health risks associated with smoking.  Patient not ready to give a trial of quitting.  Less than 5 minutes spent on counseling.  8. Elevated blood pressure reading DASH diet discussed and encouraged   Patient was given the opportunity to ask questions.  Patient verbalized understanding of the plan and was able to repeat key elements of the plan.   Orders Placed This Encounter  Procedures  . CBC  . Microalbumin / creatinine urine ratio  . Ambulatory referral to Cardiology  . POCT glucose (manual entry)  . POCT glycosylated hemoglobin (Hb A1C)  . EKG 12-Lead     Requested Prescriptions   Signed Prescriptions Disp Refills  . Exenatide ER (BYDUREON) 2 MG PEN 4 each 6    Sig: Inject 2 mg into the skin once a week. Must have office visit for refills    No follow-ups on file.  Karle Plumber, MD,  FACP

## 2019-05-10 LAB — COMPREHENSIVE METABOLIC PANEL
ALT: 16 IU/L (ref 0–32)
AST: 19 IU/L (ref 0–40)
Albumin/Globulin Ratio: 1.3 (ref 1.2–2.2)
Albumin: 4.3 g/dL (ref 3.8–4.8)
Alkaline Phosphatase: 135 IU/L — ABNORMAL HIGH (ref 39–117)
BUN/Creatinine Ratio: 11 (ref 9–23)
BUN: 21 mg/dL (ref 6–24)
Bilirubin Total: <0.2 mg/dL (ref 0.0–1.2)
CO2: 21 mmol/L (ref 20–29)
Calcium: 10 mg/dL (ref 8.7–10.2)
Chloride: 105 mmol/L (ref 96–106)
Creatinine, Ser: 1.96 mg/dL — ABNORMAL HIGH (ref 0.57–1.00)
GFR calc Af Amer: 34 mL/min/{1.73_m2} — ABNORMAL LOW (ref >59)
GFR calc non Af Amer: 29 mL/min/{1.73_m2} — ABNORMAL LOW (ref >59)
Globulin, Total: 3.4 g/dL (ref 1.5–4.5)
Glucose: 115 mg/dL — ABNORMAL HIGH (ref 65–99)
Potassium: 4.5 mmol/L (ref 3.5–5.2)
Sodium: 140 mmol/L (ref 134–144)
Total Protein: 7.7 g/dL (ref 6.0–8.5)

## 2019-05-10 LAB — LIPID PANEL
Chol/HDL Ratio: 4 ratio (ref 0.0–4.4)
Cholesterol, Total: 189 mg/dL (ref 100–199)
HDL: 47 mg/dL (ref >39)
LDL Chol Calc (NIH): 119 mg/dL — ABNORMAL HIGH (ref 0–99)
Triglycerides: 131 mg/dL (ref 0–149)
VLDL Cholesterol Cal: 23 mg/dL (ref 5–40)

## 2019-05-10 LAB — CBC
Hematocrit: 40.5 % (ref 34.0–46.6)
Hemoglobin: 12.9 g/dL (ref 11.1–15.9)
MCH: 22.1 pg — ABNORMAL LOW (ref 26.6–33.0)
MCHC: 31.9 g/dL (ref 31.5–35.7)
MCV: 69 fL — ABNORMAL LOW (ref 79–97)
Platelets: 303 10*3/uL (ref 150–450)
RBC: 5.84 x10E6/uL — ABNORMAL HIGH (ref 3.77–5.28)
RDW: 15.5 % — ABNORMAL HIGH (ref 11.7–15.4)
WBC: 8.6 10*3/uL (ref 3.4–10.8)

## 2019-05-10 LAB — MICROALBUMIN / CREATININE URINE RATIO
Creatinine, Urine: 141.7 mg/dL
Microalb/Creat Ratio: 6 mg/g creat (ref 0–29)
Microalbumin, Urine: 9.2 ug/mL

## 2019-05-11 ENCOUNTER — Other Ambulatory Visit: Payer: Self-pay | Admitting: Internal Medicine

## 2019-05-11 DIAGNOSIS — E785 Hyperlipidemia, unspecified: Secondary | ICD-10-CM

## 2019-05-11 DIAGNOSIS — E1169 Type 2 diabetes mellitus with other specified complication: Secondary | ICD-10-CM

## 2019-05-11 HISTORY — DX: Hyperlipidemia, unspecified: E78.5

## 2019-05-11 HISTORY — DX: Type 2 diabetes mellitus with other specified complication: E11.69

## 2019-05-11 MED ORDER — ATORVASTATIN CALCIUM 10 MG PO TABS: 10.0000 mg | ORAL_TABLET | Freq: Every day | ORAL | 3 refills | Status: DC

## 2019-05-12 ENCOUNTER — Ambulatory Visit (INDEPENDENT_AMBULATORY_CARE_PROVIDER_SITE_OTHER): Payer: Medicaid Other | Admitting: Cardiology

## 2019-05-12 ENCOUNTER — Telehealth: Payer: Self-pay

## 2019-05-12 ENCOUNTER — Other Ambulatory Visit: Payer: Self-pay

## 2019-05-12 VITALS — BP 124/72 | HR 88 | Ht 65.0 in | Wt 256.8 lb

## 2019-05-12 DIAGNOSIS — E1169 Type 2 diabetes mellitus with other specified complication: Secondary | ICD-10-CM

## 2019-05-12 DIAGNOSIS — E785 Hyperlipidemia, unspecified: Secondary | ICD-10-CM

## 2019-05-12 DIAGNOSIS — R079 Chest pain, unspecified: Secondary | ICD-10-CM

## 2019-05-12 DIAGNOSIS — R072 Precordial pain: Secondary | ICD-10-CM

## 2019-05-12 DIAGNOSIS — R030 Elevated blood-pressure reading, without diagnosis of hypertension: Secondary | ICD-10-CM | POA: Diagnosis not present

## 2019-05-12 NOTE — Patient Instructions (Signed)
Medication Instructions:  Your physician recommends that you continue on your current medications as directed. Please refer to the Current Medication list given to you today.  *If you need a refill on your cardiac medications before your next appointment, please call your pharmacy*   Testing/Procedures: Your physician has requested that you have a lexiscan myoview. For further information please visit [email or web address]. Please follow instruction sheet, as given.  Your physician has requested that you have an echocardiogram. Echocardiography is a painless test that uses sound waves to create images of your heart. It provides your doctor with information about the size and shape of your heart and how well your heart's chambers and valves are working. This procedure takes approximately one hour. There are no restrictions for this procedure.   Follow-Up: At [facility], you and your health needs are our priority.  As part of our continuing mission to provide you with exceptional heart care, we have created designated Provider Care Teams.  These Care Teams include your primary Cardiologist (physician) and Advanced Practice Providers (APPs -  Physician Assistants and Nurse Practitioners) who all work together to provide you with the care you need, when you need it.  We recommend signing up for the patient portal called "MyChart".  Sign up information is provided on this After Visit Summary.  MyChart is used to connect with patients for Virtual Visits (Telemedicine).  Patients are able to view lab/test results, encounter notes, upcoming appointments, etc.  Non-urgent messages can be sent to your provider as well.   To learn more about what you can do with MyChart, go to [email or web address].    Follow up with Dr. Radford Pax as needed based on results of testing.

## 2019-05-12 NOTE — Telephone Encounter (Signed)
Contacted pt to go over lab results pt didn't answer lvm asking pt to give me a call at her earliest convenience  

## 2019-05-12 NOTE — Progress Notes (Signed)
Cardiology Office Consult Note    Date:  05/12/2019   ID:  Rebecca Johnston, DOB [birth year], MRN [id number]  PCP:  Ladell Pier, MD  Cardiologist:  Fransico Him, MD   No chief complaint on file.   History of Present Illness:  Rebecca Johnston is a 49 y.o. female who is being seen today for the evaluation of chest pain at the request of Ladell Pier, MD.  This is a 49yo obese female with a hx of CKD stage 3, DM2, GERD, HLD who is referred for evaluation of chest pain.  She was recently seen by her PCP for chronic dz management and was complaining of intermittent substernal chest pain for over 2 weeks.  It is nonexertional with no radiation.  There is no associated SOB, Nausea or diaphoresis.  She is a smoker and has smoked for over 10 years but has no fm hx of CAD.      Past Medical History:  Diagnosis Date  . Abnormal CT scan, chest 09/02/2013   CTa 04/18/13  1. Study limited by body habitus with no pulmonary embolism  detected.  2. Multiple borderline and a few mildly enlarged nonspecific  mediastinal and hilar lymph nodes. Multiple pulmonary nodules  bilaterally the largest measuring 8 mm. If the patient is at high  risk for bronchogenic carcinoma, follow-up chest CT at 3-37month is  recommended.  - 12/15/13  1. Interval resolution of th  . Anemia   . Anemia due to stage 3 chronic kidney disease 07/27/2018  . Anemia of chronic disease 06/17/2018   06/2018  . Asthma   . CKD (chronic kidney disease), stage III 04/16/2017  . Class 3 severe obesity due to excess calories with serious comorbidity and body mass index (BMI) of 40.0 to 44.9 in adult ([place] 06/15/2018  . Diabetes mellitus without complication ([facility] 156/[id number] . Elevated blood pressure reading 05/09/2019  . Fibroids   . GERD (gastroesophageal reflux disease)   . History of blood transfusion Feb 2015  . Hyperlipidemia associated with type 2 diabetes mellitus ([facility] 05/11/2019  . Incarcerated hernia s/p lap repair w  mesh 12/08/2014 12/08/2014  . Neuropathy 04/04/2016  . Obesity, Class III, BMI 40-49.9 (morbid obesity) ([facility] [place]   . Seasonal allergic rhinitis 12/21/2015  . Seizure disorder ([facility] 05/14/2018  . Shortness of breath dyspnea   . Tobacco dependence 10/21/2013  . Type II diabetes mellitus with renal manifestations ([facility] 12/21/2015  . Vitamin D deficiency 06/15/2018    Past Surgical History:  Procedure Laterality Date  . ABDOMINAL HYSTERECTOMY N/A 12/08/2014   Procedure: HYSTERECTOMY ABDOMINAL;  Surgeon: JWoodroe Mode MD;  [facility];  Service: Gynecology;  Laterality: N/A;  . DILATION AND CURETTAGE OF UTERUS    . DILITATION & CURRETTAGE/HYSTROSCOPY WITH VERSAPOINT RESECTION N/A 01/06/2014   Procedure: HCorena PilgrimWITH VERSAPOINT;  Surgeon: JWoodroe Mode MD;  [facility];  Service: Gynecology;  Laterality: N/A;  . LAPAROSCOPIC LYSIS OF ADHESIONS N/A 12/08/2014   Procedure: LAPAROSCOPIC LYSIS OF ADHESIONS;  Surgeon: SMichael Boston MD;  [facility];  Service: General;  Laterality: N/A;  . OVARIAN CYST REMOVAL     cyst not removed. Cyst drained  . SUPRA-UMBILICAL HERNIA N/A 173/06/[id number]  Procedure: LAPARSCOPIC SUPRA-UMBILICAL HERNIA REPAIR ;  Surgeon: SMichael Boston MD;  [facility];  Service: General;  Laterality: N/A;  . VENTRAL HERNIA REPAIR N/A 12/08/2014   Procedure: LAPAROSCOPIC INCARCERATED INCISIONAL VENTRAL WALL HERNIA REPAIR;  Surgeon: SMichael Boston MD;  [facility];  Service: General;  Laterality: N/A;    Current Medications: Current Meds  Medication Sig  . blood glucose meter kit and supplies Dispense based on patient and insurance preference. Use up to four times daily as directed. (FOR ICD-10 E10.9, E11.9).  . cetirizine (ZYRTEC) 10 MG tablet Take 10 mg by mouth as needed for allergies.  . cholecalciferol (VITAMIN D3) 25 MCG (1000 UT) tablet Take 5,000 Units by mouth daily.  . Exenatide ER (BYDUREON) 2 MG PEN Inject 2 mg into the skin once a week. Must have office  visit for refills  . fluticasone (FLONASE) 50 MCG/ACT nasal spray Place 1 spray into both nostrils daily as needed for allergies or rhinitis.  Marland Kitchen glucose blood test strip Check blood sugars three times a day before meals  . levETIRAcetam (KEPPRA) 750 MG tablet Take 1 tablet (750 mg total) by mouth 2 (two) times daily.  Marland Kitchen topiramate (TOPAMAX) 50 MG tablet Take 1 tablet (50 mg total) by mouth 2 (two) times daily.  . Vitamin D, Ergocalciferol, (DRISDOL) 1.25 MG (50000 UT) CAPS capsule Take 50,000 Units by mouth once a week. OTC    Allergies:   Doxycycline   Social History   Socioeconomic History  . Marital status: Single    Spouse name: Not on file  . Number of children: Not on file  . Years of education: Not on file  . Highest education level: Not on file  Occupational History  . Not on file  Tobacco Use  . Smoking status: Current Every Day Smoker    Packs/day: 0.25    Years: 25.00    Pack years: 6.25    Types: Cigarettes    Last attempt to quit: 08/02/2015    Years since quitting: 3.7  . Smokeless tobacco: Never Used  Substance and Sexual Activity  . Alcohol use: Not Currently    Alcohol/week: 0.0 standard drinks    Comment: socially  . Drug use: No  . Sexual activity: Not Currently    Birth control/protection: None  Other Topics Concern  . Not on file  Social History Narrative  . Not on file   Social Determinants of Health   Financial Resource Strain:   . Difficulty of Paying Living Expenses:   Food Insecurity:   . Worried About Charity fundraiser in the Last Year:   . Arboriculturist in the Last Year:   Transportation Needs:   . Film/video editor (Medical):   Marland Kitchen Lack of Transportation (Non-Medical):   Physical Activity:   . Days of Exercise per Week:   . Minutes of Exercise per Session:   Stress:   . Feeling of Stress :   Social Connections:   . Frequency of Communication with Friends and Family:   . Frequency of Social Gatherings with Friends and Family:    . Attends Religious Services:   . Active Member of Clubs or Organizations:   . Attends Archivist Meetings:   Marland Kitchen Marital Status:      Family History:  The patient's family history includes Breast cancer in her maternal aunt; Diabetes in her father, maternal aunt, maternal aunt, maternal aunt, and paternal aunt; Hypertension in her mother.   ROS:   Please see the history of present illness.    ROS All other systems reviewed and are negative.  No flowsheet data found.     PHYSICAL EXAM:   VS:  BP 124/72   Pulse 88   Ht  '5\' 5"'  (1.651 m)   Wt 256 lb 12.8 oz (116.5 kg)   LMP 11/26/2014 (Exact Date)   BMI 42.73 kg/m    GEN: Well nourished, well developed, in no acute distress  HEENT: normal  Neck: no JVD, carotid bruits, or masses Cardiac: RRR; no murmurs, rubs, or gallops,no edema.  Intact distal pulses bilaterally.  Respiratory:  clear to auscultation bilaterally, normal work of breathing GI: soft, nontender, nondistended, + BS MS: no deformity or atrophy  Skin: warm and dry, no rash Neuro:  Alert and Oriented x 3, Strength and sensation are intact Psych: euthymic mood, full affect  Wt Readings from Last 3 Encounters:  05/12/19 256 lb 12.8 oz (116.5 kg)  05/09/19 260 lb 9.6 oz (118.2 kg)  10/19/18 270 lb 9.6 oz (122.7 kg)      Studies/Labs Reviewed:   EKG:  EKG is not  ordered today.    Recent Labs: 05/09/2019: ALT 16; BUN 21; Creatinine, Ser 1.96; Hemoglobin 12.9; Platelets 303; Potassium 4.5; Sodium 140   Lipid Panel    Component Value Date/Time   CHOL 189 05/09/2019 1153   TRIG 131 05/09/2019 1153   HDL 47 05/09/2019 1153   CHOLHDL 4.0 05/09/2019 1153   CHOLHDL 4.2 12/21/2015 1019   VLDL 34 (H) 12/21/2015 1019   LDLCALC 119 (H) 05/09/2019 1153    Additional studies/ records that were reviewed today include:  Office notes from PCP, EKG    ASSESSMENT:    1. Chest pain of uncertain etiology   2. Elevated blood pressure reading   3.  Hyperlipidemia associated with type 2 diabetes mellitus ([place])   4. Morbid obesity ([place])      PLAN:  In order of problems listed above:  1. Chest pain -she has typical and atypical components.  Pain nonexertional with no associated sx of N/Diaphoresis or SOB and no radiation. -EKG is nonischemic -no fm hx of CAD -her CRFs include ongoing tobacco use, HTN, DM2 and HLD -given elevated Creatinine of 2 cannot get coronary CTA -will get Lexiscan myoview to rule out ischemia and 2D echo to assess LVF  2. Elevated BP hx with no dx of HTN -Bp controlled -she is currently not on BP lowering meds  3.  HLD -LDL goal < 70 due to DM -Continue atorvastatin 6m daily  4.  Morbid Obesity -I have encouraged her to get into a routine exercise program and cut back on carbs and portions.   5.  CKD stage 3b -followed by PCP -creatinine in Feb 2021 was 2.04  Medication Adjustments/Labs and Tests Ordered: Current medicines are reviewed at length with the patient today.  Concerns regarding medicines are outlined above.  Medication changes, Labs and Tests ordered today are listed in the Patient Instructions below.  There are no Patient Instructions on file for this visit.   Signed, TFransico Him MD  05/12/2019 9:08 AM    [facility] [address] [place]  [id number]: [phone] Fax: [phone]

## 2019-06-08 ENCOUNTER — Telehealth (HOSPITAL_COMMUNITY): Payer: Self-pay | Admitting: *Deleted

## 2019-06-08 NOTE — Telephone Encounter (Signed)
Patient given detailed instructions per Myocardial Perfusion Study Information Sheet for the test on 06/13/19 at 10:15. Patient notified to arrive 15 minutes early and that it is imperative to arrive on time for appointment to keep from having the test rescheduled.  If you need to cancel or reschedule your appointment, please call the office within 24 hours of your appointment. . Patient verbalized understanding.Rebecca Johnston

## 2019-06-10 ENCOUNTER — Other Ambulatory Visit: Payer: Self-pay | Admitting: Neurology

## 2019-06-10 DIAGNOSIS — R569 Unspecified convulsions: Secondary | ICD-10-CM

## 2019-06-10 NOTE — Telephone Encounter (Signed)
Pt is asking for a call to discuss her possible need of a refill on her topiramate (TOPAMAX) 50 MG tablet.  Pt asked that this message be responded to with priority, please call

## 2019-06-13 ENCOUNTER — Ambulatory Visit (HOSPITAL_COMMUNITY): Payer: Medicaid Other | Attending: Internal Medicine

## 2019-06-13 ENCOUNTER — Other Ambulatory Visit: Payer: Self-pay

## 2019-06-13 ENCOUNTER — Ambulatory Visit (HOSPITAL_BASED_OUTPATIENT_CLINIC_OR_DEPARTMENT_OTHER): Payer: Medicaid Other

## 2019-06-13 DIAGNOSIS — R079 Chest pain, unspecified: Secondary | ICD-10-CM | POA: Insufficient documentation

## 2019-06-13 DIAGNOSIS — R072 Precordial pain: Secondary | ICD-10-CM | POA: Diagnosis not present

## 2019-06-13 MED ORDER — TOPIRAMATE 50 MG PO TABS: 50.0000 mg | ORAL_TABLET | Freq: Two times a day (BID) | ORAL | 5 refills | Status: DC

## 2019-06-13 MED ORDER — ADENOSINE (DIAGNOSTIC) 3 MG/ML IV SOLN
0.5600 mg/kg | Freq: Once | INTRAVENOUS | Status: AC
  Administered 2019-06-13: 60 mg via INTRAVENOUS

## 2019-06-13 MED ORDER — TECHNETIUM TC 99M TETROFOSMIN IV KIT
33.0000 | PACK | Freq: Once | INTRAVENOUS | Status: AC | PRN
  Administered 2019-06-13: 33 via INTRAVENOUS
  Filled 2019-06-13: qty 33

## 2019-06-13 MED ORDER — PERFLUTREN LIPID MICROSPHERE
1.0000 mL | INTRAVENOUS | Status: AC | PRN
  Administered 2019-06-13: 2 mL via INTRAVENOUS

## 2019-06-13 NOTE — Telephone Encounter (Signed)
I called the patient.  Looks like Topamax 50 mg twice daily was prescribed following her hospitalization in March 2020. Last prescribed by Dr. Margarita Rana.  Topamax was started while hospitalized due to headache, right temporal. Recently ran out, having headaches, needs a refill.  I will send Topamax 50 mg twice a day.  She will keep her follow-up in August with Dr. Krista Blue.

## 2019-06-13 NOTE — Addendum Note (Signed)
Addended by: Suzzanne Cloud on: 06/13/2019 04:03 PM   Modules accepted: Orders

## 2019-06-13 NOTE — Telephone Encounter (Signed)
I called pt and she is asking for refill of topamax.  This is not something that we have prescribed for her.  I will send to SS/NP to advise.  [facility] is pharmacy.

## 2019-06-14 ENCOUNTER — Telehealth: Payer: Self-pay

## 2019-06-14 ENCOUNTER — Ambulatory Visit (HOSPITAL_COMMUNITY): Payer: Medicaid Other | Attending: Cardiovascular Disease

## 2019-06-14 LAB — MYOCARDIAL PERFUSION IMAGING
LV dias vol: 71 mL (ref 46–106)
LV sys vol: 30 mL
Peak HR: 107 {beats}/min
Rest HR: 76 {beats}/min
SDS: 1
SRS: 1
SSS: 2
TID: 1.01

## 2019-06-14 MED ORDER — PANTOPRAZOLE SODIUM 40 MG PO TBEC: 40.0000 mg | DELAYED_RELEASE_TABLET | Freq: Every day | ORAL | 3 refills | Status: DC

## 2019-06-14 MED ORDER — TECHNETIUM TC 99M TETROFOSMIN IV KIT
32.4000 | PACK | Freq: Once | INTRAVENOUS | Status: AC | PRN
  Administered 2019-06-14: 32.4 via INTRAVENOUS
  Filled 2019-06-14: qty 33

## 2019-06-14 NOTE — Telephone Encounter (Signed)
Left message for patient advising her that I will be sending in a prescription for Protonix 40 mg daily. Advised her to call back with any questions. Also advised to call back to schedule a 4 week follow up appointment with an APP.

## 2019-06-14 NOTE — Telephone Encounter (Signed)
-----   Message from Sueanne Margarita, MD sent at 06/14/2019  4:53 PM EDT ----- Please add Protonix 40mg  daily and followup in 4 weeks with PA  Traci ----- Message ----- From: Antonieta Iba, RN Sent: 06/14/2019   4:46 PM EDT To: Sueanne Margarita, MD  The patient has been notified of the result and verbalized understanding.  All questions (if any) were answered. Patient states that she still has chest pain from time to time.

## 2019-07-14 ENCOUNTER — Other Ambulatory Visit: Payer: Self-pay | Admitting: Internal Medicine

## 2019-07-14 DIAGNOSIS — J301 Allergic rhinitis due to pollen: Secondary | ICD-10-CM

## 2019-07-19 NOTE — Progress Notes (Deleted)
Cardiology Office Note    Date:  07/19/2019   ID:  Rebecca Johnston, DOB 1970/05/19, MRN [id number]  PCP:  Ladell Pier, MD  Cardiologist: No primary care provider on file. EPS: None  No chief complaint on file.   History of Present Illness:  Rebecca Johnston is a 49 y.o. female with a hx of CKD stage 3, DM2, GERD, HLD, tobacco abuse who saw Dr. Radford Pax 05/12/2019 with chest pain.  Lexiscan Myoview 06/14/2019 low risk study, no ischemia EF 57% and she was placed on Protonix, 2D echo 06/13/2019 normal LVEF 65 to 70% with grade 1 DD trivial MR     Past Medical History:  Diagnosis Date  . Abnormal CT scan, chest 09/02/2013   CTa 04/18/13  1. Study limited by body habitus with no pulmonary embolism  detected.  2. Multiple borderline and a few mildly enlarged nonspecific  mediastinal and hilar lymph nodes. Multiple pulmonary nodules  bilaterally the largest measuring 8 mm. If the patient is at high  risk for bronchogenic carcinoma, follow-up chest CT at 3-51months is  recommended.  - 12/15/13  1. Interval resolution of th  . Anemia   . Anemia due to stage 3 chronic kidney disease 07/27/2018  . Anemia of chronic disease 06/17/2018   06/2018  . Asthma   . CKD (chronic kidney disease), stage III 04/16/2017  . Class 3 severe obesity due to excess calories with serious comorbidity and body mass index (BMI) of 40.0 to 44.9 in adult ([place]) 06/15/2018  . Diabetes mellitus without complication ([place]) [id number]  . Elevated blood pressure reading 05/09/2019  . Fibroids   . GERD (gastroesophageal reflux disease)   . History of blood transfusion Feb 2015  . Hyperlipidemia associated with type 2 diabetes mellitus ([place]) 05/11/2019  . Incarcerated hernia s/p lap repair w mesh 12/08/2014 12/08/2014  . Neuropathy 04/04/2016  . Obesity, Class III, BMI 40-49.9 (morbid obesity) ([place])   . Seasonal allergic rhinitis 12/21/2015  . Seizure disorder ([place]) 05/14/2018  . Shortness of breath dyspnea   . Tobacco  dependence 10/21/2013  . Type II diabetes mellitus with renal manifestations ([place]) 12/21/2015  . Vitamin D deficiency 06/15/2018    Past Surgical History:  Procedure Laterality Date  . ABDOMINAL HYSTERECTOMY N/A 12/08/2014   Procedure: HYSTERECTOMY ABDOMINAL;  Surgeon: Woodroe Mode, MD;  [facility];  Service: Gynecology;  Laterality: N/A;  . DILATION AND CURETTAGE OF UTERUS    . DILITATION & CURRETTAGE/HYSTROSCOPY WITH VERSAPOINT RESECTION N/A 01/06/2014   Procedure: Corena Pilgrim WITH VERSAPOINT;  Surgeon: Woodroe Mode, MD;  Location: [facility];  Service: Gynecology;  Laterality: N/A;  . LAPAROSCOPIC LYSIS OF ADHESIONS N/A 12/08/2014   Procedure: LAPAROSCOPIC LYSIS OF ADHESIONS;  Surgeon: Michael Boston, MD;  [facility];  Service: General;  Laterality: N/A;  . OVARIAN CYST REMOVAL     cyst not removed. Cyst drained  . SUPRA-UMBILICAL HERNIA N/A [id number]   Procedure: LAPARSCOPIC SUPRA-UMBILICAL HERNIA REPAIR ;  Surgeon: Michael Boston, MD;  [facility];  Service: General;  Laterality: N/A;  . VENTRAL HERNIA REPAIR N/A 12/08/2014   Procedure: LAPAROSCOPIC INCARCERATED INCISIONAL VENTRAL WALL HERNIA REPAIR;  Surgeon: Michael Boston, MD;  [facility];  Service: General;  Laterality: N/A;    Current Medications: No outpatient medications have been marked as taking for the 07/26/19 encounter (Appointment) with Imogene Burn, PA-C.     Allergies:   Doxycycline   Social History   Socioeconomic History  . Marital  status: Single    Spouse name: Not on file  . Number of children: Not on file  . Years of education: Not on file  . Highest education level: Not on file  Occupational History  . Not on file  Tobacco Use  . Smoking status: Current Every Day Smoker    Packs/day: 0.25    Years: 25.00    Pack years: 6.25    Types: Cigarettes    Last attempt to quit: 08/02/2015    Years since quitting: 3.9  . Smokeless tobacco: Never Used  Substance and Sexual Activity  . Alcohol  use: Not Currently    Alcohol/week: 0.0 standard drinks    Comment: socially  . Drug use: No  . Sexual activity: Not Currently    Birth control/protection: None  Other Topics Concern  . Not on file  Social History Narrative  . Not on file   Social Determinants of Health   Financial Resource Strain:   . Difficulty of Paying Living Expenses:   Food Insecurity:   . Worried About Charity fundraiser in the Last Year:   . Arboriculturist in the Last Year:   Transportation Needs:   . Film/video editor (Medical):   Marland Kitchen Lack of Transportation (Non-Medical):   Physical Activity:   . Days of Exercise per Week:   . Minutes of Exercise per Session:   Stress:   . Feeling of Stress :   Social Connections:   . Frequency of Communication with Friends and Family:   . Frequency of Social Gatherings with Friends and Family:   . Attends Religious Services:   . Active Member of Clubs or Organizations:   . Attends Archivist Meetings:   Marland Kitchen Marital Status:      Family History:  The patient's ***family history includes Breast cancer in her maternal aunt; Diabetes in her father, maternal aunt, maternal aunt, maternal aunt, and paternal aunt; Hypertension in her mother.   ROS:   Please see the history of present illness.    ROS All other systems reviewed and are negative.   PHYSICAL EXAM:   VS:  LMP 11/26/2014 (Exact Date)   Physical Exam  GEN: Well nourished, well developed, in no acute distress  HEENT: normal  Neck: no JVD, carotid bruits, or masses Cardiac:RRR; no murmurs, rubs, or gallops  Respiratory:  clear to auscultation bilaterally, normal work of breathing GI: soft, nontender, nondistended, + BS Ext: without cyanosis, clubbing, or edema, Good distal pulses bilaterally MS: no deformity or atrophy  Skin: warm and dry, no rash Neuro:  Alert and Oriented x 3, Strength and sensation are intact Psych: euthymic mood, full affect  Wt Readings from Last 3 Encounters:   06/13/19 256 lb (116.1 kg)  05/12/19 256 lb 12.8 oz (116.5 kg)  05/09/19 260 lb 9.6 oz (118.2 kg)      Studies/Labs Reviewed:   EKG:  EKG is*** ordered today.  The ekg ordered today demonstrates ***  Recent Labs: 05/09/2019: ALT 16; BUN 21; Creatinine, Ser 1.96; Hemoglobin 12.9; Platelets 303; Potassium 4.5; Sodium 140   Lipid Panel    Component Value Date/Time   CHOL 189 05/09/2019 1153   TRIG 131 05/09/2019 1153   HDL 47 05/09/2019 1153   CHOLHDL 4.0 05/09/2019 1153   CHOLHDL 4.2 12/21/2015 1019   VLDL 34 (H) 12/21/2015 1019   LDLCALC 119 (H) 05/09/2019 1153    Additional studies/ records that were reviewed today include:  Lexiscan Myoview 06/14/2019  Study Highlights     The left ventricular ejection fraction is normal (55-65%).  Nuclear stress EF: 57%.  There was no ST segment deviation noted during stress.  The study is normal.  This is a low risk study.   Normal resting and stress perfusion. No ischemia or infarction EF 57%     2D echo 06/13/2019 IMPRESSIONS     1. Left ventricular ejection fraction, by estimation, is 65 to 70%. The  left ventricle has normal function. The left ventricle has no regional  wall motion abnormalities. Left ventricular diastolic parameters are  consistent with Grade I diastolic  dysfunction (impaired relaxation).   2. Right ventricular systolic function is normal. The right ventricular  size is normal.   3. The mitral valve is normal in structure. Trivial mitral valve  regurgitation.   4. The aortic valve is normal in structure. Aortic valve regurgitation is  not visualized.   Comparison(s): The left ventricular function has improved.      ASSESSMENT:    1. Chest pain, unspecified type   2. Essential hypertension   3. Hyperlipidemia, unspecified hyperlipidemia type   4. Class 3 severe obesity due to excess calories with serious comorbidity and body mass index (BMI) of 40.0 to 44.9 in adult (H[facility])   5. Stage 3b  chronic kidney disease      PLAN:  In order of problems listed above:  Chest pain with low risk Lexiscan no ischemia 06/14/2019, normal LV function on echo-started on Protonix  Hypertension  Hyperlipidemia on atorvastatin 10 mg daily  Morbid obesity  CKD stage IIIb    Medication Adjustments/Labs and Tests Ordered: Current medicines are reviewed at length with the patient today.  Concerns regarding medicines are outlined above.  Medication changes, Labs and Tests ordered today are listed in the Patient Instructions below. There are no Patient Instructions on file for this visit.   Sumner Boast, PA-C  07/19/2019 10:47 AM    [facility] [address], [place], [place]  [address] Phone: [phone]; Fax: [phone]

## 2019-07-25 NOTE — Progress Notes (Signed)
Virtual Visit via Telephone Note   This visit type was conducted due to national recommendations for restrictions regarding the COVID-19 Pandemic (e.g. social distancing) in an effort to limit this patient's exposure and mitigate transmission in our community.  Due to her co-morbid illnesses, this patient is at least at moderate risk for complications without adequate follow up.  This format is felt to be most appropriate for this patient at this time.  The patient did not have access to video technology/had technical difficulties with video requiring transitioning to audio format only (telephone).  All issues noted in this document were discussed and addressed.  No physical exam could be performed with this format.  Please refer to the patient's chart for her  consent to telehealth for [facility].   The patient was identified using 2 identifiers.  Date:  07/27/2019   ID:  Rebecca Johnston, DOB April 04, 1970, MRN [id number]  Patient Location: Home Provider Location: Office  PCP:  Ladell Pier, MD  Cardiologist:  Fransico Him, MD   Electrophysiologist:  None   Evaluation Performed:  Follow-Up Visit  Chief Complaint:  Follow up  History of Present Illness:    Rebecca Johnston is a 49 y.o. female with  with a hx of CKD stage 3, DM2, GERD, HLD, tobacco abuse who saw Dr. Radford Pax 05/12/2019 with chest pain.  Lexiscan Myoview 06/14/2019 low risk study, no ischemia EF 57% and she was placed on Protonix, 2D echo 06/13/2019 normal LVEF 65 to 70% with grade 1 DD trivial MR.  Patient says Protonix is working. No further chest pain. Tests reviewed in detail with patient. Has had GERD in the past.    The patient does not have symptoms concerning for COVID-19 infection (fever, chills, cough, or new shortness of breath).    Past Medical History:  Diagnosis Date  . Abnormal CT scan, chest 09/02/2013   CTa 04/18/13  1. Study limited by body habitus with no pulmonary embolism  detected.  2.  Multiple borderline and a few mildly enlarged nonspecific  mediastinal and hilar lymph nodes. Multiple pulmonary nodules  bilaterally the largest measuring 8 mm. If the patient is at high  risk for bronchogenic carcinoma, follow-up chest CT at 3-61month is  recommended.  - 12/15/13  1. Interval resolution of th  . Anemia   . Anemia due to stage 3 chronic kidney disease 07/27/2018  . Anemia of chronic disease 06/17/2018   06/2018  . Asthma   . CKD (chronic kidney disease), stage III 04/16/2017  . Class 3 severe obesity due to excess calories with serious comorbidity and body mass index (BMI) of 40.0 to 44.9 in adult ([place] 06/15/2018  . Diabetes mellitus without complication ([facility] 100/7622 . Elevated blood pressure reading 05/09/2019  . Fibroids   . GERD (gastroesophageal reflux disease)   . History of blood transfusion Feb 2015  . Hyperlipidemia associated with type 2 diabetes mellitus ([facility] 05/11/2019  . Incarcerated hernia s/p lap repair w mesh 12/08/2014 12/08/2014  . Neuropathy 04/04/2016  . Obesity, Class III, BMI 40-49.9 (morbid obesity) ([facility]   . Seasonal allergic rhinitis 12/21/2015  . Seizure disorder ([facility] 05/14/2018  . Shortness of breath dyspnea   . Tobacco dependence 10/21/2013  . Type II diabetes mellitus with renal manifestations ([facility] 12/21/2015  . Vitamin D deficiency 06/15/2018   Past Surgical History:  Procedure Laterality Date  . ABDOMINAL HYSTERECTOMY N/A 12/08/2014   Procedure: HYSTERECTOMY ABDOMINAL;  Surgeon: JWoodroe Mode MD;  [facility];  Service: Gynecology;  Laterality: N/A;  . DILATION AND CURETTAGE OF UTERUS    . DILITATION & CURRETTAGE/HYSTROSCOPY WITH VERSAPOINT RESECTION N/A 01/06/2014   Procedure: Corena Pilgrim WITH VERSAPOINT;  Surgeon: Woodroe Mode, MD;  Location: [facility];  Service: Gynecology;  Laterality: N/A;  . LAPAROSCOPIC LYSIS OF ADHESIONS N/A 12/08/2014   Procedure: LAPAROSCOPIC LYSIS OF ADHESIONS;  Surgeon: Michael Boston, MD;  Location: [facility];   Service: General;  Laterality: N/A;  . OVARIAN CYST REMOVAL     cyst not removed. Cyst drained  . SUPRA-UMBILICAL HERNIA N/A 85/04/7780   Procedure: LAPARSCOPIC SUPRA-UMBILICAL HERNIA REPAIR ;  Surgeon: Michael Boston, MD;  Location: [facility];  Service: General;  Laterality: N/A;  . VENTRAL HERNIA REPAIR N/A 12/08/2014   Procedure: LAPAROSCOPIC INCARCERATED INCISIONAL VENTRAL WALL HERNIA REPAIR;  Surgeon: Michael Boston, MD;  Location: [facility];  Service: General;  Laterality: N/A;     Current Meds  Medication Sig  . atorvastatin (LIPITOR) 10 MG tablet Take 1 tablet (10 mg total) by mouth daily.  . blood glucose meter kit and supplies Dispense based on patient and insurance preference. Use up to four times daily as directed. (FOR ICD-10 E10.9, E11.9).  . cetirizine (ZYRTEC) 10 MG tablet Take 1 tablet by mouth once daily  . cholecalciferol (VITAMIN D3) 25 MCG (1000 UT) tablet Take 5,000 Units by mouth daily.  . Exenatide ER (BYDUREON) 2 MG PEN Inject 2 mg into the skin once a week. Must have office visit for refills  . fluticasone (FLONASE) 50 MCG/ACT nasal spray USE 1 SPRAY(S) IN EACH NOSTRIL ONCE DAILY AS NEEDED FOR ALLERGIES OR  RHINITIS  . glucose blood test strip Check blood sugars three times a day before meals  . levETIRAcetam (KEPPRA) 750 MG tablet Take 1 tablet (750 mg total) by mouth 2 (two) times daily.  . pantoprazole (PROTONIX) 40 MG tablet Take 1 tablet (40 mg total) by mouth daily.  Marland Kitchen topiramate (TOPAMAX) 50 MG tablet Take 1 tablet (50 mg total) by mouth 2 (two) times daily.  . Vitamin D, Ergocalciferol, (DRISDOL) 1.25 MG (50000 UT) CAPS capsule Take 50,000 Units by mouth once a week. OTC     Allergies:   Doxycycline   Social History   Tobacco Use  . Smoking status: Current Every Day Smoker    Packs/day: 0.25    Years: 25.00    Pack years: 6.25    Types: Cigarettes    Last attempt to quit: 08/02/2015    Years since quitting: 3.9  . Smokeless tobacco: Never Used  Substance Use  Topics  . Alcohol use: Not Currently    Alcohol/week: 0.0 standard drinks    Comment: socially  . Drug use: No     Family Hx: The patient's family history includes Breast cancer in her maternal aunt; Diabetes in her father, maternal aunt, maternal aunt, maternal aunt, and paternal aunt; Hypertension in her mother.  ROS:   Please see the history of present illness.      All other systems reviewed and are negative.   Prior CV studies:   The following studies were reviewed today: Echo 06/13/19 IMPRESSIONS     1. Left ventricular ejection fraction, by estimation, is 65 to 70%. The  left ventricle has normal function. The left ventricle has no regional  wall motion abnormalities. Left ventricular diastolic parameters are  consistent with Grade I diastolic  dysfunction (impaired relaxation).   2. Right ventricular systolic function is normal. The right ventricular  size  is normal.   3. The mitral valve is normal in structure. Trivial mitral valve  regurgitation.   4. The aortic valve is normal in structure. Aortic valve regurgitation is  not visualized.   Comparison(s): The left ventricular function has improved.   Myoview 06/13/19 Study Highlights     The left ventricular ejection fraction is normal (55-65%).  Nuclear stress EF: 57%.  There was no ST segment deviation noted during stress.  The study is normal.  This is a low risk study.   Normal resting and stress perfusion. No ischemia or infarction EF 57%       Labs/Other Tests and Data Reviewed:    EKG:  No ECG reviewed.  Recent Labs: 05/09/2019: ALT 16; BUN 21; Creatinine, Ser 1.96; Hemoglobin 12.9; Platelets 303; Potassium 4.5; Sodium 140   Recent Lipid Panel Lab Results  Component Value Date/Time   CHOL 189 05/09/2019 11:53 AM   TRIG 131 05/09/2019 11:53 AM   HDL 47 05/09/2019 11:53 AM   CHOLHDL 4.0 05/09/2019 11:53 AM   CHOLHDL 4.2 12/21/2015 10:19 AM   LDLCALC 119 (H) 05/09/2019 11:53 AM    Wt  Readings from Last 3 Encounters:  06/13/19 256 lb (116.1 kg)  05/12/19 256 lb 12.8 oz (116.5 kg)  05/09/19 260 lb 9.6 oz (118.2 kg)     Objective:    Vital Signs:  Ht '5\' 5"'  (1.651 m)   LMP 11/26/2014 (Exact Date)   BMI 42.60 kg/m    VITAL SIGNS:  reviewed  ASSESSMENT & PLAN:     Chest pain with low risk Lexiscan no ischemia 06/14/2019, normal LV function, grade 1 DD on echo-2 gm sodium diet discussed-started on Protonix and no further chest pain  Hyperlipidemia on atorvastatin 10 mg daily  Morbid obesity-weight loss recommended-has lost a little weight. Starting to exercise. Offered referral to weight loss center but declined at this time.   CKD stage IIIb  GERD protonix helping. Offered referral to GI but wants to hold off. Dietary changes went over with patient.  Covid Education-hasn't been vaccinated yet  COVID-19 Education: The signs and symptoms of COVID-19 were discussed with the patient and how to seek care for testing (follow up with PCP or arrange E-visit).   The importance of social distancing was discussed today.  Time:   Today, I have spent 11:45 minutes with the patient with telehealth technology discussing the above problems.     Medication Adjustments/Labs and Tests Ordered: Current medicines are reviewed at length with the patient today.  Concerns regarding medicines are outlined above.   Tests Ordered: No orders of the defined types were placed in this encounter.   Medication Changes: No orders of the defined types were placed in this encounter.   Follow Up:  Either In Person or Virtual prn Dr. Radford Pax  Signed, Ermalinda Barrios, PA-C  07/27/2019 8:47 AM    [place]

## 2019-07-26 ENCOUNTER — Telehealth: Payer: Medicaid Other | Admitting: Physician Assistant

## 2019-07-27 ENCOUNTER — Telehealth (INDEPENDENT_AMBULATORY_CARE_PROVIDER_SITE_OTHER): Payer: Medicaid Other | Admitting: Physician Assistant

## 2019-07-27 ENCOUNTER — Other Ambulatory Visit: Payer: Self-pay

## 2019-07-27 ENCOUNTER — Encounter: Payer: Self-pay | Admitting: Physician Assistant

## 2019-07-27 ENCOUNTER — Telehealth: Payer: Self-pay

## 2019-07-27 VITALS — Ht 65.0 in

## 2019-07-27 DIAGNOSIS — K219 Gastro-esophageal reflux disease without esophagitis: Secondary | ICD-10-CM | POA: Diagnosis not present

## 2019-07-27 DIAGNOSIS — Z7189 Other specified counseling: Secondary | ICD-10-CM

## 2019-07-27 DIAGNOSIS — N1832 Chronic kidney disease, stage 3b: Secondary | ICD-10-CM | POA: Diagnosis not present

## 2019-07-27 DIAGNOSIS — E785 Hyperlipidemia, unspecified: Secondary | ICD-10-CM

## 2019-07-27 DIAGNOSIS — R0789 Other chest pain: Secondary | ICD-10-CM

## 2019-07-27 NOTE — Patient Instructions (Signed)
Medication Instructions:  Your physician recommends that you continue on your current medications as directed. Please refer to the Current Medication list given to you today.  *If you need a refill on your cardiac medications before your next appointment, please call your pharmacy*   Lab Work: None ordered  If you have labs (blood work) drawn today and your tests are completely normal, you will receive your results only by: Marland Kitchen MyChart Message (if you have MyChart) OR . A paper copy in the mail If you have any lab test that is abnormal or we need to change your treatment, we will call you to review the results.   Testing/Procedures: None ordered   Follow-Up: At [facility], you and your health needs are our priority.  As part of our continuing mission to provide you with exceptional heart care, we have created designated Provider Care Teams.  These Care Teams include your primary Cardiologist (physician) and Advanced Practice Providers (APPs -  Physician Assistants and Nurse Practitioners) who all work together to provide you with the care you need, when you need it.  We recommend signing up for the patient portal called "MyChart".  Sign up information is provided on this After Visit Summary.  MyChart is used to connect with patients for Virtual Visits (Telemedicine).  Patients are able to view lab/test results, encounter notes, upcoming appointments, etc.  Non-urgent messages can be sent to your provider as well.   To learn more about what you can do with MyChart, go to [email or web address].    Your next appointment:   AS NEEDED  The format for your next appointment:   Either In Person or Virtual  Provider:   Fransico Him, MD   Other Instructions None

## 2019-07-27 NOTE — Telephone Encounter (Signed)
Pt came for appt 07/27/19 & has no concerns

## 2019-07-27 NOTE — Telephone Encounter (Signed)
  Patient Consent for Virtual Visit         Rebecca Johnston has provided verbal consent on 07/27/2019 for a virtual visit (video or telephone).   CONSENT FOR VIRTUAL VISIT FOR:  Rebecca Johnston  By participating in this virtual visit I agree to the following:  I hereby voluntarily request, consent and authorize [facility] and its employed or contracted physicians, physician assistants, nurse practitioners or other licensed health care professionals (the Practitioner), to provide me with telemedicine health care services (the "Services") as deemed necessary by the treating Practitioner. I acknowledge and consent to receive the Services by the Practitioner via telemedicine. I understand that the telemedicine visit will involve communicating with the Practitioner through live audiovisual communication technology and the disclosure of certain medical information by electronic transmission. I acknowledge that I have been given the opportunity to request an in-person assessment or other available alternative prior to the telemedicine visit and am voluntarily participating in the telemedicine visit.  I understand that I have the right to withhold or withdraw my consent to the use of telemedicine in the course of my care at any time, without affecting my right to future care or treatment, and that the Practitioner or I may terminate the telemedicine visit at any time. I understand that I have the right to inspect all information obtained and/or recorded in the course of the telemedicine visit and may receive copies of available information for a reasonable fee.  I understand that some of the potential risks of receiving the Services via telemedicine include:  Marland Kitchen Delay or interruption in medical evaluation due to technological equipment failure or disruption; . Information transmitted may not be sufficient (e.g. poor resolution of images) to allow for appropriate medical decision making by the  Practitioner; and/or  . In rare instances, security protocols could fail, causing a breach of personal health information.  Furthermore, I acknowledge that it is my responsibility to provide information about my medical history, conditions and care that is complete and accurate to the best of my ability. I acknowledge that Practitioner's advice, recommendations, and/or decision may be based on factors not within their control, such as incomplete or inaccurate data provided by me or distortions of diagnostic images or specimens that may result from electronic transmissions. I understand that the practice of medicine is not an exact science and that Practitioner makes no warranties or guarantees regarding treatment outcomes. I acknowledge that a copy of this consent can be made available to me via my patient portal ([place]), or I can request a printed copy by calling the office of [place] [place].    I understand that my insurance will be billed for this visit.   I have read or had this consent read to me. . I understand the contents of this consent, which adequately explains the benefits and risks of the Services being provided via telemedicine.  . I have been provided ample opportunity to ask questions regarding this consent and the Services and have had my questions answered to my satisfaction. . I give my informed consent for the services to be provided through the use of telemedicine in my medical care

## 2019-08-05 ENCOUNTER — Telehealth: Payer: Self-pay

## 2019-08-05 NOTE — Telephone Encounter (Signed)
Bydureon no longer available.  Bydureon Bcise PA approved on Medicaid thru 08/04/20--if appropriate to change to the BCISE can you please send in a new script?

## 2019-08-08 MED ORDER — BYDUREON BCISE 2 MG/0.85ML ~~LOC~~ AUIJ: 2.0000 mg | AUTO-INJECTOR | SUBCUTANEOUS | 2 refills | Status: DC

## 2019-08-08 NOTE — Telephone Encounter (Signed)
Bydureon BCise is an auto-injector which is different than the pen-injector. They have removed the Bydureon pen injector from the market. I have sent this prescription to the pharmacy.

## 2019-08-08 NOTE — Addendum Note (Signed)
Addended by: Daisy Blossom, Annie Main L on: 08/08/2019 11:38 AM   Modules accepted: Orders

## 2019-10-11 ENCOUNTER — Ambulatory Visit: Payer: Medicaid Other | Admitting: Neurology

## 2019-10-11 ENCOUNTER — Other Ambulatory Visit: Payer: Self-pay

## 2019-10-11 ENCOUNTER — Encounter: Payer: Self-pay | Admitting: Neurology

## 2019-10-11 VITALS — BP 118/64 | HR 76 | Ht 65.0 in | Wt 269.3 lb

## 2019-10-11 DIAGNOSIS — R569 Unspecified convulsions: Secondary | ICD-10-CM | POA: Diagnosis not present

## 2019-10-11 DIAGNOSIS — G43709 Chronic migraine without aura, not intractable, without status migrainosus: Secondary | ICD-10-CM | POA: Diagnosis not present

## 2019-10-11 DIAGNOSIS — G40909 Epilepsy, unspecified, not intractable, without status epilepticus: Secondary | ICD-10-CM | POA: Diagnosis not present

## 2019-10-11 DIAGNOSIS — G43909 Migraine, unspecified, not intractable, without status migrainosus: Secondary | ICD-10-CM | POA: Insufficient documentation

## 2019-10-11 DIAGNOSIS — IMO0002 Reserved for concepts with insufficient information to code with codable children: Secondary | ICD-10-CM

## 2019-10-11 MED ORDER — TOPIRAMATE 100 MG PO TABS: 100.0000 mg | ORAL_TABLET | Freq: Two times a day (BID) | ORAL | 4 refills | Status: DC

## 2019-10-11 MED ORDER — SUMATRIPTAN SUCCINATE 50 MG PO TABS: 50.0000 mg | ORAL_TABLET | ORAL | 6 refills | Status: DC | PRN

## 2019-10-11 NOTE — Progress Notes (Signed)
HISTORICAL  Rebecca Johnston a 49 year old female, seen in request byher primary care physician Dr. Wynetta Emery, Neoma Laming B,for seizure, I saw her since May 2018.  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  She presented to [facility] on May 06, 2018 for headaches, blurred vision, dizziness, she described headache at the right temporal region, was noted to have some slurred speech, facial droop, with occasionally nausea, vomiting, she was treated for community-acquired pneumonia on May 28, 2018 because persistent nasal congestion, was treated with doxycycline, I personally reviewed CT and MRI of the brain showed no significant abnormality  CT angiogram of head neck was negative  UDS was negative, glucose on admission was elevated 355, hyponatremia of 127, bicarb was low at 18, she was given Reglan and Compazine for nausea, later she was noted to have eye deviation, suspected for seizure, she was loaded with Dilantin then Keppra, and the Lyrica, EEG showed 2 electrographic seizure activity emanating out of the right temporal region, spreading mostly to the entire right side, was seen by neuro hospitalist, contributed her partial seizureto hyonatremia, and hyperglycemia. She was intubated on March 8 toMarch 10, 2020, had bouts of hypotension require epinephrine, also required nutritional support  She was discharged to rehabilitation on March 13-24, Keppra dose was adjusted to 750 mg twice a day, poorly controlled diabetes, A1c was 14.9,  Laboratory evaluations in lab in Aprilshowednormal CBC,BMP, creat 1.76., Total iron-binding capacity was 239,  She remained on Keppra 750 mg twice a day,  Personally reviewed MRI of the brain without contrast in March 2020, no acute intracranial abnormality  EEG  June 2020 was normal.  Last was a virtual visit in February 2021, doing great, A1c was 8.1 in August 2020, Keppra level was 10.8 in September 2020  She no longer has recurrent seizure, previous seizure in March 2020 was considered due to hyponatremia of 127 in the setting of significantly elevated glucose hyperglycemia 355, she is taking Topamax 50 mg twice a day for headache,she has headache about twice a week, usually starting at the occipital region, occasionally bilateral frontal region, with light noise sensitivity, lasting for few hours, she wants to stop polypharmacy treatment.  I personally reviewed MRI of the brain without contrast March 2020, there was no acute abnormality, it was ordered because her frequent persistent right-sided headaches CT angiogram head and neck no significant abnormality  Most recent laboratory evaluation March 2021, LDL 119, CMP showed creatinine 1.96, GFR of 29, CBC showed hemoglobin of 12.9, decreased MCV of 69, elevated RDW of 15.5, she is under care of nephrologist  Laboratory evaluation in March 2021, A1c 5.7, glucose 160, hemoglobin 12.9, creatinine 1.96, GFR of 29, LDL 119  REVIEW OF SYSTEMS: Full 14 system review of systems performed and notable only for as above All other review of systems were negative.  ALLERGIES: Allergies  Allergen Reactions  . Doxycycline     "bad dreaming, hallucination, dizziness" per pt    HOME MEDICATIONS: Current Outpatient Medications  Medication Sig Dispense Refill  . atorvastatin (LIPITOR) 10 MG tablet Take 1 tablet (10 mg total) by mouth daily. 30 tablet 3  . blood glucose meter kit and supplies Dispense based on patient and insurance preference. Use up to four times daily as directed. (FOR ICD-10 E10.9, E11.9). 1 each 0  . cetirizine (ZYRTEC) 10 MG  tablet Take 1 tablet by mouth once daily 30 tablet 2  . cholecalciferol (VITAMIN D3) 25 MCG (1000 UT) tablet Take 5,000 Units by mouth daily.    . Exenatide ER  (BYDUREON BCISE) 2 MG/0.85ML AUIJ Inject 2 mg into the skin once a week. 4 pen 2  . fluticasone (FLONASE) 50 MCG/ACT nasal spray USE 1 SPRAY(S) IN EACH NOSTRIL ONCE DAILY AS NEEDED FOR ALLERGIES OR  RHINITIS 16 g 2  . glucose blood test strip Check blood sugars three times a day before meals 100 each 12  . levETIRAcetam (KEPPRA) 750 MG tablet Take 1 tablet (750 mg total) by mouth 2 (two) times daily. 180 tablet 1  . pantoprazole (PROTONIX) 40 MG tablet Take 1 tablet (40 mg total) by mouth daily. 90 tablet 3  . topiramate (TOPAMAX) 50 MG tablet Take 1 tablet (50 mg total) by mouth 2 (two) times daily. 60 tablet 5  . Vitamin D, Ergocalciferol, (DRISDOL) 1.25 MG (50000 UT) CAPS capsule Take 50,000 Units by mouth once a week. OTC     No current facility-administered medications for this visit.    PAST MEDICAL HISTORY: Past Medical History:  Diagnosis Date  . Abnormal CT scan, chest 09/02/2013   CTa 04/18/13  1. Study limited by body habitus with no pulmonary embolism  detected.  2. Multiple borderline and a few mildly enlarged nonspecific  mediastinal and hilar lymph nodes. Multiple pulmonary nodules  bilaterally the largest measuring 8 mm. If the patient is at high  risk for bronchogenic carcinoma, follow-up chest CT at 3-63month is  recommended.  - 12/15/13  1. Interval resolution of th  . Anemia   . Anemia due to stage 3 chronic kidney disease 07/27/2018  . Anemia of chronic disease 06/17/2018   06/2018  . Asthma   . CKD (chronic kidney disease), stage III 04/16/2017  . Class 3 severe obesity due to excess calories with serious comorbidity and body mass index (BMI) of 40.0 to 44.9 in adult ([facility] 06/15/2018  . Diabetes mellitus without complication ([facility] [place] 162/2633 . Elevated blood pressure reading 05/09/2019  . Fibroids   . GERD (gastroesophageal reflux disease)   . History of blood transfusion Feb 2015  . Hyperlipidemia associated with type 2 diabetes mellitus ([facility] 05/11/2019  . Incarcerated hernia  s/p lap repair w mesh 12/08/2014 12/08/2014  . Neuropathy 04/04/2016  . Obesity, Class III, BMI 40-49.9 (morbid obesity) ([facility]   . Seasonal allergic rhinitis 12/21/2015  . Seizure disorder ([facility] 05/14/2018  . Shortness of breath dyspnea   . Tobacco dependence 10/21/2013  . Type II diabetes mellitus with renal manifestations ([facility] 12/21/2015  . Vitamin D deficiency 06/15/2018    PAST SURGICAL HISTORY: Past Surgical History:  Procedure Laterality Date  . ABDOMINAL HYSTERECTOMY N/A 12/08/2014   Procedure: HYSTERECTOMY ABDOMINAL;  Surgeon: JWoodroe Mode MD;  Location: [facility];  Service: Gynecology;  Laterality: N/A;  . DILATION AND CURETTAGE OF UTERUS    . DILITATION & CURRETTAGE/HYSTROSCOPY WITH VERSAPOINT RESECTION N/A 01/06/2014   Procedure: HCorena PilgrimWITH VERSAPOINT;  Surgeon: JWoodroe Mode MD;  [facility];  Service: Gynecology;  Laterality: N/A;  . LAPAROSCOPIC LYSIS OF ADHESIONS N/A 12/08/2014   Procedure: LAPAROSCOPIC LYSIS OF ADHESIONS;  Surgeon: SMichael Boston MD;  Location: [facility];  Service: General;  Laterality: N/A;  . OVARIAN CYST REMOVAL     cyst not removed. Cyst drained  . SUPRA-UMBILICAL HERNIA N/A 135/06/5623  Procedure: LAPARSCOPIC SUPRA-UMBILICAL HERNIA REPAIR ;  Surgeon: SMichael Boston  MD;  Location: [facility];  Service: General;  Laterality: N/A;  . VENTRAL HERNIA REPAIR N/A 12/08/2014   Procedure: LAPAROSCOPIC INCARCERATED INCISIONAL VENTRAL WALL HERNIA REPAIR;  Surgeon: Michael Boston, MD;  Location: [facility];  Service: General;  Laterality: N/A;    FAMILY HISTORY: Family History  Problem Relation Age of Onset  . Hypertension Mother   . Diabetes Father   . Breast cancer Maternal Aunt   . Diabetes Maternal Aunt   . Diabetes Paternal Aunt   . Diabetes Maternal Aunt   . Diabetes Maternal Aunt     SOCIAL HISTORY: Social History   Socioeconomic History  . Marital status: Single    Spouse name: Not on file  . Number of children: Not on file  . Years of education:  Not on file  . Highest education level: Not on file  Occupational History  . Not on file  Tobacco Use  . Smoking status: Current Every Day Smoker    Packs/day: 0.25    Years: 25.00    Pack years: 6.25    Types: Cigarettes    Last attempt to quit: 08/02/2015    Years since quitting: 4.1  . Smokeless tobacco: Never Used  Substance and Sexual Activity  . Alcohol use: Not Currently    Alcohol/week: 0.0 standard drinks    Comment: socially  . Drug use: No  . Sexual activity: Not Currently    Birth control/protection: None  Other Topics Concern  . Not on file  Social History Narrative  . Not on file   Social Determinants of Health   Financial Resource Strain:   . Difficulty of Paying Living Expenses:   Food Insecurity:   . Worried About Charity fundraiser in the Last Year:   . Arboriculturist in the Last Year:   Transportation Needs:   . Film/video editor (Medical):   Marland Kitchen Lack of Transportation (Non-Medical):   Physical Activity:   . Days of Exercise per Week:   . Minutes of Exercise per Session:   Stress:   . Feeling of Stress :   Social Connections:   . Frequency of Communication with Friends and Family:   . Frequency of Social Gatherings with Friends and Family:   . Attends Religious Services:   . Active Member of Clubs or Organizations:   . Attends Archivist Meetings:   Marland Kitchen Marital Status:   Intimate Partner Violence:   . Fear of Current or Ex-Partner:   . Emotionally Abused:   Marland Kitchen Physically Abused:   . Sexually Abused:      PHYSICAL EXAM   Vitals:   10/11/19 1005  BP: 118/64  Pulse: 76  Weight: 269 lb 5 oz (122.2 kg)  Height: '5\' 5"'  (1.651 m)   Not recorded     Body mass index is 44.82 kg/m.  PHYSICAL EXAMNIATION:  Gen: NAD, conversant, well nourised, well groomed                     Cardiovascular: Regular rate rhythm, no peripheral edema, warm, nontender. Eyes: Conjunctivae clear without exudates or hemorrhage Neck: Supple, no  carotid bruits. Pulmonary: Clear to auscultation bilaterally   NEUROLOGICAL EXAM:  MENTAL STATUS: Speech:    Speech is normal; fluent and spontaneous with normal comprehension.  Cognition:     Orientation to time, place and person     Normal recent and remote memory     Normal Attention span and concentration  Normal Language, naming, repeating,spontaneous speech     Fund of knowledge   CRANIAL NERVES: CN II: Visual fields are full to confrontation. Pupils are round equal and briskly reactive to light. CN III, IV, VI: extraocular movement are normal. No ptosis. CN V: Facial sensation is intact to light touch CN VII: Face is symmetric with normal eye closure  CN VIII: Hearing is normal to causal conversation. CN IX, X: Phonation is normal. CN XI: Head turning and shoulder shrug are intact  MOTOR: There is no pronator drift of out-stretched arms. Muscle bulk and tone are normal. Muscle strength is normal.  REFLEXES: Reflexes are 2+ and symmetric at the biceps, triceps, knees, and ankles. Plantar responses are flexor.  SENSORY: Intact to light touch, pinprick and vibratory sensation are intact in fingers and toes.  COORDINATION: There is no trunk or limb dysmetria noted.  GAIT/STANCE: Posture is normal. Gait is steady with normal steps, base, arm swing, and turning. Heel and toe walking are normal. Tandem gait is normal.  Romberg is absent.   DIAGNOSTIC DATA (LABS, IMAGING, TESTING) - I reviewed patient records, labs, notes, testing and imaging myself where available.   ASSESSMENT AND PLAN  Flower Franko is a 49 y.o. female   Seizure in March 2020  In the setting of hyponatremia, sodium 127, poorly controlled diabetes significant hyperglycemia 355, A1c was 14.9  No recurrent seizure, tapering off Keppra 750 mg twice a day  EEG was normal in June 2020  Chronic migraine headaches  Was on Topamax 50 mg twice a day, increased to 100 mg twice a day  Imitrex 50  mg as needed   Marcial Pacas, M.D. Ph.D.  [facility] [address], [place], [place] [address] Ph: [phone] Fax: [phone]  CC:  Ladell Pier, MD [place],  [place] [id number]  Ladell Pier, MD   Total time spent reviewing the chart, obtaining history, examined patient, ordering tests, documentation, consultations and family, care coordination was 40 minutes

## 2019-10-20 ENCOUNTER — Ambulatory Visit (HOSPITAL_COMMUNITY)
Admission: EM | Admit: 2019-10-20 | Discharge: 2019-10-20 | Disposition: A | Discharge status: Home / Self Care | Payer: Medicaid Other | Attending: Family Medicine | Admitting: Family Medicine

## 2019-10-20 ENCOUNTER — Encounter (HOSPITAL_COMMUNITY): Payer: Self-pay | Admitting: Emergency Medicine

## 2019-10-20 ENCOUNTER — Other Ambulatory Visit: Payer: Self-pay

## 2019-10-20 DIAGNOSIS — Z90710 Acquired absence of both cervix and uterus: Secondary | ICD-10-CM | POA: Diagnosis not present

## 2019-10-20 DIAGNOSIS — K219 Gastro-esophageal reflux disease without esophagitis: Secondary | ICD-10-CM | POA: Insufficient documentation

## 2019-10-20 DIAGNOSIS — D631 Anemia in chronic kidney disease: Secondary | ICD-10-CM | POA: Diagnosis not present

## 2019-10-20 DIAGNOSIS — Z881 Allergy status to other antibiotic agents status: Secondary | ICD-10-CM | POA: Insufficient documentation

## 2019-10-20 DIAGNOSIS — Z833 Family history of diabetes mellitus: Secondary | ICD-10-CM | POA: Insufficient documentation

## 2019-10-20 DIAGNOSIS — Z8249 Family history of ischemic heart disease and other diseases of the circulatory system: Secondary | ICD-10-CM | POA: Insufficient documentation

## 2019-10-20 DIAGNOSIS — E1140 Type 2 diabetes mellitus with diabetic neuropathy, unspecified: Secondary | ICD-10-CM | POA: Insufficient documentation

## 2019-10-20 DIAGNOSIS — E785 Hyperlipidemia, unspecified: Secondary | ICD-10-CM | POA: Insufficient documentation

## 2019-10-20 DIAGNOSIS — N183 Chronic kidney disease, stage 3 unspecified: Secondary | ICD-10-CM | POA: Insufficient documentation

## 2019-10-20 DIAGNOSIS — J069 Acute upper respiratory infection, unspecified: Secondary | ICD-10-CM | POA: Diagnosis not present

## 2019-10-20 DIAGNOSIS — E559 Vitamin D deficiency, unspecified: Secondary | ICD-10-CM | POA: Insufficient documentation

## 2019-10-20 DIAGNOSIS — G40909 Epilepsy, unspecified, not intractable, without status epilepticus: Secondary | ICD-10-CM | POA: Diagnosis not present

## 2019-10-20 DIAGNOSIS — Z79899 Other long term (current) drug therapy: Secondary | ICD-10-CM | POA: Diagnosis not present

## 2019-10-20 DIAGNOSIS — J45909 Unspecified asthma, uncomplicated: Secondary | ICD-10-CM | POA: Insufficient documentation

## 2019-10-20 DIAGNOSIS — Z20822 Contact with and (suspected) exposure to covid-19: Secondary | ICD-10-CM | POA: Insufficient documentation

## 2019-10-20 DIAGNOSIS — R030 Elevated blood-pressure reading, without diagnosis of hypertension: Secondary | ICD-10-CM | POA: Diagnosis not present

## 2019-10-20 DIAGNOSIS — E1169 Type 2 diabetes mellitus with other specified complication: Secondary | ICD-10-CM | POA: Diagnosis not present

## 2019-10-20 DIAGNOSIS — F17210 Nicotine dependence, cigarettes, uncomplicated: Secondary | ICD-10-CM | POA: Insufficient documentation

## 2019-10-20 DIAGNOSIS — E1122 Type 2 diabetes mellitus with diabetic chronic kidney disease: Secondary | ICD-10-CM | POA: Diagnosis not present

## 2019-10-20 LAB — SARS CORONAVIRUS 2 (TAT 6-24 HRS): SARS Coronavirus 2: NEGATIVE

## 2019-10-20 MED ORDER — GUAIFENESIN ER 600 MG PO TB12: 600.0000 mg | ORAL_TABLET | Freq: Two times a day (BID) | ORAL | 0 refills | Status: AC | PRN

## 2019-10-20 NOTE — ED Triage Notes (Signed)
Patient presents to [facility] for assessment of 1 week of chills, runny nose, sore throat starting 4 days ago, diaphoresis.  States she thinks she may have had a COVID exposure.

## 2019-10-20 NOTE — ED Provider Notes (Signed)
[place]    CSN: [id number] Arrival date & time: 10/20/19  1128      History   Chief Complaint Chief Complaint  Patient presents with  . URI    HPI Rebecca Johnston is a 49 y.o. female, PMH significant for CKD stage III, seizure disorder, hyperlipidemia, and type 2 diabetes, presenting for evaluation of rhinorrhea, sore throat, and congestion.  Reports these symptoms have been present for the past 1 week and slowly improving.  She was in contact with a Covid positive person 2 weeks ago very briefly, wearing her mask at the time.  Otherwise no known sick contacts and no one else is sick at home.  She has not been vaccinated against Covid.  No associated fever, difficulty breathing, chest pain, nausea, vomiting.  She is planning on going to [facility] for her [place] on Sunday morning.  Has not tried much to help her feel better.  Additionally, she reports a several day history of right leg muscle cramps at nighttime.  Predominantly in her calf.  Has not had this before.  No preceding injury or trauma.  No associated rash.  Reports she has been trying to stay hydrated.  No previous DVT or prolonged sitting.  She reports some bilateral pedal edema that she has been following with her primary care provider, Dr. Wynetta Emery, for continued evaluation.  No associated ankle pain, weakness, or difficulty walking.    Past Medical History:  Diagnosis Date  . Abnormal CT scan, chest 09/02/2013   CTa 04/18/13  1. Study limited by body habitus with no pulmonary embolism  detected.  2. Multiple borderline and a few mildly enlarged nonspecific  mediastinal and hilar lymph nodes. Multiple pulmonary nodules  bilaterally the largest measuring 8 mm. If the patient is at high  risk for bronchogenic carcinoma, follow-up chest CT at 3-55month is  recommended.  - 12/15/13  1. Interval resolution of th  . Anemia   . Anemia due to stage 3 chronic kidney disease 07/27/2018  . Anemia of chronic disease  06/17/2018   06/2018  . Asthma   . CKD (chronic kidney disease), stage III 04/16/2017  . Class 3 severe obesity due to excess calories with serious comorbidity and body mass index (BMI) of 40.0 to 44.9 in adult ([place] 06/15/2018  . Diabetes mellitus without complication ([place] [id number] . Elevated blood pressure reading 05/09/2019  . Fibroids   . GERD (gastroesophageal reflux disease)   . History of blood transfusion Feb 2015  . Hyperlipidemia associated with type 2 diabetes mellitus ([place] 05/11/2019  . Incarcerated hernia s/p lap repair w mesh 12/08/2014 12/08/2014  . Neuropathy 04/04/2016  . Obesity, Class III, BMI 40-49.9 (morbid obesity) ([place]   . Seasonal allergic rhinitis 12/21/2015  . Seizure disorder ([place] 05/14/2018  . Shortness of breath dyspnea   . Tobacco dependence 10/21/2013  . Type II diabetes mellitus with renal manifestations ([place] 12/21/2015  . Vitamin D deficiency 06/15/2018    Patient Active Problem List   Diagnosis Date Noted  . Chronic migraine 10/11/2019  . Hyperlipidemia associated with type 2 diabetes mellitus ([place] 05/11/2019  . Elevated blood pressure reading 05/09/2019  . Anemia due to stage 3 chronic kidney disease 07/27/2018  . Anemia of chronic disease 06/17/2018  . Vitamin D deficiency 06/15/2018  . Class 3 severe obesity due to excess calories with serious comorbidity and body mass index (BMI) of 40.0 to 44.9 in adult ([place] 06/15/2018  . Seizure disorder ([place] 05/14/2018  .  GERD (gastroesophageal reflux disease) 05/07/2018  . CKD (chronic kidney disease), stage III 04/16/2017  . Neuropathy 04/04/2016  . Seasonal allergic rhinitis 12/21/2015  . Type II diabetes mellitus with renal manifestations ([place]) 12/21/2015  . Incarcerated hernia s/p lap repair w mesh 12/08/2014 12/08/2014  . Tobacco dependence 10/21/2013  . Abnormal CT scan, chest 09/02/2013    Past Surgical History:  Procedure Laterality Date  . ABDOMINAL HYSTERECTOMY N/A 12/08/2014   Procedure:  HYSTERECTOMY ABDOMINAL;  Surgeon: Woodroe Mode, MD;  Location: [facility];  Service: Gynecology;  Laterality: N/A;  . DILATION AND CURETTAGE OF UTERUS    . DILITATION & CURRETTAGE/HYSTROSCOPY WITH VERSAPOINT RESECTION N/A 01/06/2014   Procedure: Corena Pilgrim WITH VERSAPOINT;  Surgeon: Woodroe Mode, MD;  Location: [facility];  Service: Gynecology;  Laterality: N/A;  . LAPAROSCOPIC LYSIS OF ADHESIONS N/A 12/08/2014   Procedure: LAPAROSCOPIC LYSIS OF ADHESIONS;  Surgeon: Michael Boston, MD;  Location: [facility];  Service: General;  Laterality: N/A;  . OVARIAN CYST REMOVAL     cyst not removed. Cyst drained  . SUPRA-UMBILICAL HERNIA N/A 43/03/5398   Procedure: LAPARSCOPIC SUPRA-UMBILICAL HERNIA REPAIR ;  Surgeon: Michael Boston, MD;  Location: [facility];  Service: General;  Laterality: N/A;  . VENTRAL HERNIA REPAIR N/A 12/08/2014   Procedure: LAPAROSCOPIC INCARCERATED INCISIONAL VENTRAL WALL HERNIA REPAIR;  Surgeon: Michael Boston, MD;  Location: [facility];  Service: General;  Laterality: N/A;    OB History    Gravida  1   Para  0   Term  0   Preterm  0   AB  1   Living  0     SAB  0   TAB  0   Ectopic  1   Multiple  0   Live Births               Home Medications    Prior to Admission medications   Medication Sig Start Date End Date Taking? Authorizing Provider  atorvastatin (LIPITOR) 10 MG tablet Take 1 tablet (10 mg total) by mouth daily. 05/11/19   Ladell Pier, MD  blood glucose meter kit and supplies Dispense based on patient and insurance preference. Use up to four times daily as directed. (FOR ICD-10 E10.9, E11.9). 05/26/18   Angiulli, Lavon Paganini, PA-C  cetirizine (ZYRTEC) 10 MG tablet Take 1 tablet by mouth once daily 07/15/19   Ladell Pier, MD  cholecalciferol (VITAMIN D3) 25 MCG (1000 UT) tablet Take 5,000 Units by mouth daily.    [provider]  Exenatide ER (BYDUREON BCISE) 2 MG/0.85ML AUIJ Inject 2 mg into the skin once a week. 08/08/19   Ladell Pier, MD    fluticasone (FLONASE) 50 MCG/ACT nasal spray USE 1 SPRAY(S) IN EACH NOSTRIL ONCE DAILY AS NEEDED FOR ALLERGIES OR  RHINITIS 07/15/19   Ladell Pier, MD  glucose blood test strip Check blood sugars three times a day before meals 07/27/18   Ladell Pier, MD  guaiFENesin (MUCINEX) 600 MG 12 hr tablet Take 1 tablet (600 mg total) by mouth 2 (two) times daily as needed. 10/20/19 11/19/19  Patriciaann Clan, DO  pantoprazole (PROTONIX) 40 MG tablet Take 1 tablet (40 mg total) by mouth daily. 06/14/19   Sueanne Margarita, MD  SUMAtriptan (IMITREX) 50 MG tablet Take 1 tablet (50 mg total) by mouth every 2 (two) hours as needed for migraine. May repeat in 2 hours if headache persists or recurs. 10/11/19   Marcial Pacas, MD  topiramate (TOPAMAX) 100 MG tablet Take 1 tablet (100 mg total) by mouth 2 (two) times daily. 10/11/19   Marcial Pacas, MD  Vitamin D, Ergocalciferol, (DRISDOL) 1.25 MG (50000 UT) CAPS capsule Take 50,000 Units by mouth once a week. OTC 07/21/18   [provider]    Family History Family History  Problem Relation Age of Onset  . Hypertension Mother   . Diabetes Father   . Breast cancer Maternal Aunt   . Diabetes Maternal Aunt   . Diabetes Paternal Aunt   . Diabetes Maternal Aunt   . Diabetes Maternal Aunt     Social History Social History   Tobacco Use  . Smoking status: Current Every Day Smoker    Packs/day: 0.25    Years: 25.00    Pack years: 6.25    Types: Cigarettes    Last attempt to quit: 08/02/2015    Years since quitting: 4.2  . Smokeless tobacco: Never Used  Substance Use Topics  . Alcohol use: Not Currently    Alcohol/week: 0.0 standard drinks    Comment: socially  . Drug use: No     Allergies   Doxycycline   Review of Systems Review of Systems  Constitutional: Negative for activity change, appetite change, chills, fatigue and fever.  HENT: Positive for congestion, postnasal drip, rhinorrhea, sneezing and sore throat. Negative for sinus  pressure, sinus pain and trouble swallowing.   Respiratory: Negative for chest tightness, shortness of breath and wheezing.   Cardiovascular: Negative for chest pain.  Gastrointestinal: Negative for abdominal pain, nausea and vomiting.  Neurological: Negative for dizziness, light-headedness and headaches.     Physical Exam Triage Vital Signs ED Triage Vitals  Enc Vitals Group     BP 10/20/19 1254 129/77     Pulse Rate 10/20/19 1254 81     Resp 10/20/19 1254 18     Temp 10/20/19 1254 98.2 F (36.8 C)     Temp src --      SpO2 --      Weight --      Height --      Head Circumference --      Peak Flow --      Pain Score 10/20/19 1256 7     Pain Loc --      Pain Edu? --      Excl. in [place]? --    No data found.  Updated Vital Signs BP 129/77 (BP Location: Left Arm)   Pulse 81   Temp 98.2 F (36.8 C)   Resp 18   LMP 11/26/2014 (Exact Date)   Physical Exam Constitutional:      General: She is not in acute distress.    Appearance: Normal appearance. She is not ill-appearing.  HENT:     Head: Normocephalic and atraumatic.     Nose: Rhinorrhea present.     Mouth/Throat:     Mouth: Mucous membranes are moist.     Pharynx: No oropharyngeal exudate or posterior oropharyngeal erythema.  Eyes:     Extraocular Movements: Extraocular movements intact.  Cardiovascular:     Rate and Rhythm: Normal rate and regular rhythm.  Pulmonary:     Effort: Pulmonary effort is normal.     Breath sounds: Normal breath sounds. No wheezing, rhonchi or rales.  Musculoskeletal:        General: Normal range of motion.     Cervical back: Normal range of motion and neck supple.     Comments: Mild nonpitting pedal edema  predominantly around ankles bilaterally, and equal in comparison.  No tenderness with palpation of bilateral calves and appear equal in size.  No overlying lower extremity rash or ecchymoses.  Full ROM within ankle joints bilaterally.  Lymphadenopathy:     Cervical: No cervical  adenopathy.  Skin:    General: Skin is warm and dry.     Capillary Refill: Capillary refill takes less than 2 seconds.  Neurological:     Mental Status: She is alert.      UC Treatments / Results  Labs (all labs ordered are listed, but only abnormal results are displayed) Labs Reviewed  SARS CORONAVIRUS 2 (TAT 6-24 HRS)    EKG   Radiology No results found.  Procedures Procedures (including critical care time)  Medications Ordered in UC Medications - No data to display  Initial Impression / Assessment and Plan / UC Course  I have reviewed the triage vital signs and the nursing notes.  Pertinent labs & imaging results that were available during my care of the patient were reviewed by me and considered in my medical decision making (see chart for details).    49 year old female with well-controlled T2DM and CKD stage III presenting for evaluation of URI symptoms in the setting of possible Covid exposure two weeks ago.  Reassuringly afebrile and well-appearing with nonlabored breathing on exam.  Suspect likely viral URI vs concurrent allergic rhinitis.  Less likely Covid given presentation, however will obtain swab today to officially rule this out.  Rx Mucinex for symptomatic relief in addition to Tylenol as needed.  Return precautions discussed.  Additionally reports nocturnal leg cramping for the past several days.  May be in the setting of viral illness as above.  Discussed obtaining labs today to rule out any worsening anemia, renal function, or electrolyte derangements contributing, however patient opted to follow-up with primary provider for labs if not improving within the next week.  Recommended maintaining adequate hydration and stretching her legs prior to bedtime.  Final Clinical Impressions(s) / UC Diagnoses   Final diagnoses:  Viral upper respiratory tract infection     Discharge Instructions     It was wonderful meeting you today.  We have swabbed you for  Covid today and hopefully that should be back in the next 1-2 days prior to your trip.  Continue to use your Flonase and Zyrtec to help with any allergy symptoms.  Make sure you are staying well-hydrated.  You can also use Tylenol 650 mg every 4-6 hours as needed.  I will also send in Mucinex to help with some of your congestion.  Follow-up in the ED or here if you have any difficulty breathing, chest pain.  Please make sure you stretch your legs prior to bedtime to see if this makes an impact on your leg cramping.  If it is not getting better with this and appropriate hydration, follow-up with your primary care provider to discuss repeating labs.  You should also continue to follow-up with your primary care provider for your diabetes and would be due for an appointment.    ED Prescriptions    Medication Sig Dispense Auth. Provider   guaiFENesin (MUCINEX) 600 MG 12 hr tablet Take 1 tablet (600 mg total) by mouth 2 (two) times daily as needed. 30 tablet Patriciaann Clan, DO     PDMP not reviewed this encounter.   Patriciaann Clan, DO 10/20/19 1432

## 2019-10-20 NOTE — Discharge Instructions (Signed)
It was wonderful meeting you today.  We have swabbed you for Covid today and hopefully that should be back in the next 1-2 days prior to your trip.  Continue to use your Flonase and Zyrtec to help with any allergy symptoms.  Make sure you are staying well-hydrated.  You can also use Tylenol 650 mg every 4-6 hours as needed.  I will also send in Mucinex to help with some of your congestion.  Follow-up in the ED or here if you have any difficulty breathing, chest pain.  Please make sure you stretch your legs prior to bedtime to see if this makes an impact on your leg cramping.  If it is not getting better with this and appropriate hydration, follow-up with your primary care provider to discuss repeating labs.  You should also continue to follow-up with your primary care provider for your diabetes and would be due for an appointment.

## 2019-11-22 DIAGNOSIS — E1129 Type 2 diabetes mellitus with other diabetic kidney complication: Secondary | ICD-10-CM | POA: Diagnosis not present

## 2019-11-22 DIAGNOSIS — E872 Acidosis: Secondary | ICD-10-CM | POA: Diagnosis not present

## 2019-11-22 DIAGNOSIS — G40909 Epilepsy, unspecified, not intractable, without status epilepticus: Secondary | ICD-10-CM | POA: Diagnosis not present

## 2019-11-22 DIAGNOSIS — Z6841 Body Mass Index (BMI) 40.0 and over, adult: Secondary | ICD-10-CM | POA: Diagnosis not present

## 2019-11-22 DIAGNOSIS — N183 Chronic kidney disease, stage 3 unspecified: Secondary | ICD-10-CM | POA: Diagnosis not present

## 2019-11-22 DIAGNOSIS — Z862 Personal history of diseases of the blood and blood-forming organs and certain disorders involving the immune mechanism: Secondary | ICD-10-CM | POA: Diagnosis not present

## 2019-11-22 DIAGNOSIS — E1122 Type 2 diabetes mellitus with diabetic chronic kidney disease: Secondary | ICD-10-CM | POA: Diagnosis not present

## 2020-01-11 ENCOUNTER — Other Ambulatory Visit: Payer: Self-pay | Admitting: Internal Medicine

## 2020-01-11 DIAGNOSIS — Z1231 Encounter for screening mammogram for malignant neoplasm of breast: Secondary | ICD-10-CM

## 2020-02-21 ENCOUNTER — Other Ambulatory Visit: Payer: Self-pay

## 2020-02-21 ENCOUNTER — Ambulatory Visit
Admission: RE | Admit: 2020-02-21 | Discharge: 2020-02-21 | Disposition: A | Discharge status: Home / Self Care | Payer: Medicaid Other | Source: Ambulatory Visit | Attending: Internal Medicine | Admitting: Internal Medicine

## 2020-02-21 DIAGNOSIS — Z1231 Encounter for screening mammogram for malignant neoplasm of breast: Secondary | ICD-10-CM

## 2020-04-17 ENCOUNTER — Ambulatory Visit: Payer: Medicaid Other | Admitting: Neurology

## 2020-04-17 ENCOUNTER — Other Ambulatory Visit: Payer: Self-pay

## 2020-04-17 VITALS — BP 118/95 | HR 74 | Ht 65.0 in | Wt 276.5 lb

## 2020-04-17 DIAGNOSIS — G43709 Chronic migraine without aura, not intractable, without status migrainosus: Secondary | ICD-10-CM

## 2020-04-17 DIAGNOSIS — G4730 Sleep apnea, unspecified: Secondary | ICD-10-CM

## 2020-04-17 DIAGNOSIS — R569 Unspecified convulsions: Secondary | ICD-10-CM | POA: Diagnosis not present

## 2020-04-17 MED ORDER — TOPIRAMATE 100 MG PO TABS: 100.0000 mg | ORAL_TABLET | Freq: Two times a day (BID) | ORAL | 4 refills | Status: DC

## 2020-04-17 MED ORDER — SUMATRIPTAN SUCCINATE 50 MG PO TABS: 50.0000 mg | ORAL_TABLET | ORAL | 11 refills | Status: DC | PRN

## 2020-04-17 MED ORDER — VENLAFAXINE HCL ER 37.5 MG PO CP24: 37.5000 mg | ORAL_CAPSULE | Freq: Every day | ORAL | 11 refills | Status: DC

## 2020-04-17 NOTE — Progress Notes (Signed)
HISTORICAL  Rebecca Johnston a 50 year old female, seen in request byher primary care physician Dr. Wynetta Emery, Neoma Laming B,for seizure, I saw her since May 2018.  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  She presented to [facility] on May 06, 2018 for headaches, blurred vision, dizziness, she described headache at the right temporal region, was noted to have some slurred speech, facial droop, with occasionally nausea, vomiting, she was treated for community-acquired pneumonia on May 28, 2018 because persistent nasal congestion, was treated with doxycycline, I personally reviewed CT and MRI of the brain showed no significant abnormality  CT angiogram of head neck was negative  UDS was negative, glucose on admission was elevated 355, hyponatremia of 127, bicarb was low at 18, she was given Reglan and Compazine for nausea, later she was noted to have eye deviation, suspected for seizure, she was loaded with Dilantin then Keppra, and the Lyrica, EEG showed 2 electrographic seizure activity emanating out of the right temporal region, spreading mostly to the entire right side, was seen by neuro hospitalist, contributed her partial seizureto hyonatremia, and hyperglycemia. She was intubated on March 8 toMarch 10, 2020, had bouts of hypotension require epinephrine, also required nutritional support  She was discharged to rehabilitation on March 13-24, Keppra dose was adjusted to 750 mg twice a day, poorly controlled diabetes, A1c was 14.9,  Laboratory evaluations in lab in Aprilshowednormal CBC,BMP, creat 1.76., Total iron-binding capacity was 239,  She remained on Keppra 750 mg twice a day,  Personally reviewed MRI of the brain without contrast in March 2020, no acute intracranial abnormality  EEG  June 2020 was normal.  Last was a virtual visit in February 2021, doing great, A1c was 8.1 in August 2020, Keppra level was 10.8 in September 2020  She no longer has recurrent seizure, previous seizure in March 2020 was considered due to hyponatremia of 127 in the setting of significantly elevated glucose hyperglycemia 355, she is taking Topamax 50 mg twice a day for headache,she has headache about twice a week, usually starting at the occipital region, occasionally bilateral frontal region, with light noise sensitivity, lasting for few hours, she wants to stop polypharmacy treatment.  I personally reviewed MRI of the brain without contrast March 2020, there was no acute abnormality, it was ordered because her frequent persistent right-sided headaches CT angiogram head and neck no significant abnormality  Most recent laboratory evaluation March 2021, LDL 119, CMP showed creatinine 1.96, GFR of 29, CBC showed hemoglobin of 12.9, decreased MCV of 69, elevated RDW of 15.5, she is under care of nephrologist  Laboratory evaluation in March 2021, A1c 5.7, glucose 160, hemoglobin 12.9, creatinine 1.96, GFR of 29, LDL 119   UPDATE Apr 17 2020: She continue complains of daily frontal pressure headache, 8 out of 10, Imitrex 50 mg as needed was helpful  She did complains of frequent morning headaches, frequent awakening during sleep, snoring, sleepy and tired during the day, previous attempted sleep night study was unsuccessful, does carry the diagnosis of obstructive sleep apnea by outside sleep study Headache usually last about 30 minutes, she has been taking frequent daily Tylenol the past few years   REVIEW OF SYSTEMS: Full 14 system review of systems performed and notable only for as above All other review of systems were negative.  ALLERGIES: Allergies  Allergen Reactions  Doxycycline     "bad dreaming, hallucination, dizziness" per pt    HOME MEDICATIONS: Current Outpatient Medications   Medication Sig Dispense Refill   atorvastatin (LIPITOR) 10 MG tablet Take 1 tablet (10 mg total) by mouth daily. 30 tablet 3   blood glucose meter kit and supplies Dispense based on patient and insurance preference. Use up to four times daily as directed. (FOR ICD-10 E10.9, E11.9). 1 each 0   cetirizine (ZYRTEC) 10 MG tablet Take 1 tablet by mouth once daily 30 tablet 2   cholecalciferol (VITAMIN D3) 25 MCG (1000 UT) tablet Take 2,000 Units by mouth daily.     Exenatide ER (BYDUREON BCISE) 2 MG/0.85ML AUIJ Inject 2 mg into the skin once a week. 4 pen 2   fluticasone (FLONASE) 50 MCG/ACT nasal spray USE 1 SPRAY(S) IN EACH NOSTRIL ONCE DAILY AS NEEDED FOR ALLERGIES OR  RHINITIS 16 g 2   glucose blood test strip Check blood sugars three times a day before meals 100 each 12   pantoprazole (PROTONIX) 40 MG tablet Take 1 tablet (40 mg total) by mouth daily. 90 tablet 3   SUMAtriptan (IMITREX) 50 MG tablet Take 1 tablet (50 mg total) by mouth every 2 (two) hours as needed for migraine. May repeat in 2 hours if headache persists or recurs. 12 tablet 6   topiramate (TOPAMAX) 100 MG tablet Take 1 tablet (100 mg total) by mouth 2 (two) times daily. 180 tablet 4   No current facility-administered medications for this visit.    PAST MEDICAL HISTORY: Past Medical History:  Diagnosis Date   Abnormal CT scan, chest 09/02/2013   CTa 04/18/13  1. Study limited by body habitus with no pulmonary embolism  detected.  2. Multiple borderline and a few mildly enlarged nonspecific  mediastinal and hilar lymph nodes. Multiple pulmonary nodules  bilaterally the largest measuring 8 mm. If the patient is at high  risk for bronchogenic carcinoma, follow-up chest CT at 3-95month is  recommended.  - 12/15/13  1. Interval resolution of th   Anemia    Anemia due to stage 3 chronic kidney disease 07/27/2018   Anemia of chronic disease 06/17/2018   06/2018   Asthma    CKD (chronic kidney disease), stage III  04/16/2017   Class 3 severe obesity due to excess calories with serious comorbidity and body mass index (BMI) of 40.0 to 44.9 in adult ([facility] 06/15/2018   Diabetes mellitus without complication ([facility] 132/2025  Elevated blood pressure reading 05/09/2019   Fibroids    GERD (gastroesophageal reflux disease)    History of blood transfusion Feb 2015   Hyperlipidemia associated with type 2 diabetes mellitus ([facility] 05/11/2019   Incarcerated hernia s/p lap repair w mesh 12/08/2014 12/08/2014   Neuropathy 04/04/2016   Obesity, Class III, BMI 40-49.9 (morbid obesity) ([facility]    Seasonal allergic rhinitis 12/21/2015   Seizure disorder ([facility] 05/14/2018   Shortness of breath dyspnea    Tobacco dependence 10/21/2013   Type II diabetes mellitus with renal manifestations ([facility] [place] 12/21/2015   Vitamin D deficiency 06/15/2018    PAST SURGICAL HISTORY: Past Surgical History:  Procedure Laterality Date   ABDOMINAL HYSTERECTOMY N/A 12/08/2014   Procedure: HYSTERECTOMY ABDOMINAL;  Surgeon: JWoodroe Mode MD;  Location: [facility];  Service: Gynecology;  Laterality: N/A;   DILATION AND CURETTAGE OF UTERUS     DILITATION & CURRETTAGE/HYSTROSCOPY WITH VERSAPOINT RESECTION N/A 01/06/2014   Procedure: HCorena PilgrimWITH VERSAPOINT;  Surgeon: JWoodroe Mode MD;  Location:  [facility];  Service: Gynecology;  Laterality: N/A;   LAPAROSCOPIC LYSIS OF ADHESIONS N/A 12/08/2014   Procedure: LAPAROSCOPIC LYSIS OF ADHESIONS;  Surgeon: Michael Boston, MD;  Location: [facility];  Service: General;  Laterality: N/A;   OVARIAN CYST REMOVAL     cyst not removed. Cyst drained   SUPRA-UMBILICAL HERNIA N/A 85/08/3147   Procedure: LAPARSCOPIC SUPRA-UMBILICAL HERNIA REPAIR ;  Surgeon: Michael Boston, MD;  Location: [facility];  Service: General;  Laterality: N/A;   VENTRAL HERNIA REPAIR N/A 12/08/2014   Procedure: LAPAROSCOPIC INCARCERATED INCISIONAL VENTRAL WALL HERNIA REPAIR;  Surgeon: Michael Boston, MD;  Location: [facility];  Service: General;   Laterality: N/A;    FAMILY HISTORY: Family History  Problem Relation Age of Onset   Hypertension Mother    Diabetes Father    Breast cancer Maternal Aunt    Diabetes Maternal Aunt    Diabetes Paternal Aunt    Diabetes Maternal Aunt    Diabetes Maternal Aunt     SOCIAL HISTORY: Social History   Socioeconomic History   Marital status: Single    Spouse name: Not on file   Number of children: Not on file   Years of education: Not on file   Highest education level: Not on file  Occupational History   Not on file  Tobacco Use   Smoking status: Current Every Day Smoker    Packs/day: 0.25    Years: 25.00    Pack years: 6.25    Types: Cigarettes    Last attempt to quit: 08/02/2015    Years since quitting: 4.7   Smokeless tobacco: Never Used  Substance and Sexual Activity   Alcohol use: Not Currently    Alcohol/week: 0.0 standard drinks    Comment: socially   Drug use: No   Sexual activity: Not Currently    Birth control/protection: None  Other Topics Concern   Not on file  Social History Narrative   Not on file   Social Determinants of Health   Financial Resource Strain: Not on file  Food Insecurity: Not on file  Transportation Needs: Not on file  Physical Activity: Not on file  Stress: Not on file  Social Connections: Not on file  Intimate Partner Violence: Not on file     PHYSICAL EXAM   Vitals:   04/17/20 1014  BP: (!) 118/95  Pulse: 74  SpO2: 98%  Weight: 276 lb 8 oz (125.4 kg)  Height: _0  (1.651 m)   Not recorded     Body mass index is 46.01 kg/m.  PHYSICAL EXAMNIATION:  Gen: NAD, conversant, well nourised, well groomed                     Cardiovascular: Regular rate rhythm, no peripheral edema, warm, nontender. Eyes: Conjunctivae clear without exudates or hemorrhage Neck: Supple, no carotid bruits. Pulmonary: Clear to auscultation bilaterally   NEUROLOGICAL EXAM:  MENTAL STATUS: Speech:    Speech is normal;  fluent and spontaneous with normal comprehension.  Cognition:     Orientation to time, place and person     Normal recent and remote memory     Normal Attention span and concentration     Normal Language, naming, repeating,spontaneous speech     Fund of knowledge   CRANIAL NERVES: CN II: Visual fields are full to confrontation. Pupils are round equal and briskly reactive to light. CN III, IV, VI: extraocular movement are normal. No ptosis. CN V: Facial sensation is intact to light touch  CN VII: Face is symmetric with normal eye closure  CN VIII: Hearing is normal to causal conversation. CN IX, X: Phonation is normal.  Narrow oropharyngeal CN XI: Head turning and shoulder shrug are intact  MOTOR: There is no pronator drift of out-stretched arms. Muscle bulk and tone are normal. Muscle strength is normal.  REFLEXES: Reflexes are 2+ and symmetric at the biceps, triceps, knees, and ankles. Plantar responses are flexor.  SENSORY: Intact to light touch, pinprick and vibratory sensation are intact in fingers and toes.  COORDINATION: There is no trunk or limb dysmetria noted.  GAIT/STANCE: Posture is normal. Gait is steady with normal steps, base, arm swing, and turning. Heel and toe walking are normal. Tandem gait is normal.  Romberg is absent.   DIAGNOSTIC DATA (LABS, IMAGING, TESTING) - I reviewed patient records, labs, notes, testing and imaging myself where available.   ASSESSMENT AND PLAN  Letecia Arps is a 50 y.o. female   Seizure in March 2020  In the setting of hyponatremia, sodium 127, poorly controlled diabetes significant hyperglycemia 355, A1c was 14.9  EEG was normal in June 2020  Had no recurrent seizure tapering off Keppra  MRI of brain without contrast was normal in March 2020  Chronic migraine headaches  Continue Topamax 100 mg twice daily as migraine prevention  Effexor xr 37.5 mg for migraine prevention  Imitrex 50 mg as needed   Obstructive  sleep apnea  Referral to sleep study   Marcial Pacas, M.D. Ph.D.  [facility] [address], [place], [place] [address] Ph: [phone] Fax: [phone]  CC:  Ladell Pier, MD [place],  [place] [id number]  Ladell Pier, MD   Total time spent reviewing the chart, obtaining history, examined patient, ordering tests, documentation, consultations and family, care coordination was 40 minutes

## 2020-04-24 ENCOUNTER — Telehealth: Payer: Self-pay | Admitting: Neurology

## 2020-04-24 NOTE — Telephone Encounter (Signed)
Dr Krista Blue, We received a referral from you for this patient. The patient had a sleep study done on 12-18-17 with Dr Annamaria Boots. Per our policy the patient would need to continue care with Dr Annamaria Boots.  Thank you so much.

## 2020-05-14 DIAGNOSIS — Z6841 Body Mass Index (BMI) 40.0 and over, adult: Secondary | ICD-10-CM | POA: Diagnosis not present

## 2020-05-14 DIAGNOSIS — G40909 Epilepsy, unspecified, not intractable, without status epilepticus: Secondary | ICD-10-CM | POA: Diagnosis not present

## 2020-05-14 DIAGNOSIS — E1129 Type 2 diabetes mellitus with other diabetic kidney complication: Secondary | ICD-10-CM | POA: Diagnosis not present

## 2020-05-14 DIAGNOSIS — E1122 Type 2 diabetes mellitus with diabetic chronic kidney disease: Secondary | ICD-10-CM | POA: Diagnosis not present

## 2020-05-14 DIAGNOSIS — N183 Chronic kidney disease, stage 3 unspecified: Secondary | ICD-10-CM | POA: Diagnosis not present

## 2020-05-14 DIAGNOSIS — Z862 Personal history of diseases of the blood and blood-forming organs and certain disorders involving the immune mechanism: Secondary | ICD-10-CM | POA: Diagnosis not present

## 2020-05-27 ENCOUNTER — Encounter: Payer: Self-pay | Admitting: Internal Medicine

## 2020-05-27 NOTE — Progress Notes (Signed)
And seen by nephrologist Danie Chandler on 05/14/2020.  Most with CKD stage III very slowly progressive.  BP controlled.  No protein urea so no RASTs inhibition or SGLT 2i scribed.  R recommended.  She was on Lipitor 10 mg daily and we discussed resuming it versus lower potency if she did not tolerate it.  She plans to resume and if not tolerating she will discuss with her PCP. Labs: Creatinine of 1.89, GFR of 32, PTH 34 UA negative for protein.  Urine microalbumin ratio of 10.  Globin 13.4, A1c of 7.9.

## 2020-07-06 ENCOUNTER — Encounter: Payer: Self-pay | Admitting: Internal Medicine

## 2020-07-06 ENCOUNTER — Other Ambulatory Visit: Payer: Self-pay

## 2020-07-06 ENCOUNTER — Ambulatory Visit: Payer: Medicaid Other | Attending: Internal Medicine | Admitting: Internal Medicine

## 2020-07-06 VITALS — BP 126/90 | HR 91 | Resp 20 | Ht 64.0 in | Wt 282.8 lb

## 2020-07-06 DIAGNOSIS — Z87891 Personal history of nicotine dependence: Secondary | ICD-10-CM | POA: Diagnosis not present

## 2020-07-06 DIAGNOSIS — E785 Hyperlipidemia, unspecified: Secondary | ICD-10-CM

## 2020-07-06 DIAGNOSIS — N1832 Chronic kidney disease, stage 3b: Secondary | ICD-10-CM

## 2020-07-06 DIAGNOSIS — E1122 Type 2 diabetes mellitus with diabetic chronic kidney disease: Secondary | ICD-10-CM | POA: Diagnosis not present

## 2020-07-06 DIAGNOSIS — Z0000 Encounter for general adult medical examination without abnormal findings: Secondary | ICD-10-CM

## 2020-07-06 DIAGNOSIS — Z2821 Immunization not carried out because of patient refusal: Secondary | ICD-10-CM | POA: Diagnosis not present

## 2020-07-06 DIAGNOSIS — E1169 Type 2 diabetes mellitus with other specified complication: Secondary | ICD-10-CM

## 2020-07-06 DIAGNOSIS — R030 Elevated blood-pressure reading, without diagnosis of hypertension: Secondary | ICD-10-CM

## 2020-07-06 DIAGNOSIS — Z1211 Encounter for screening for malignant neoplasm of colon: Secondary | ICD-10-CM

## 2020-07-06 MED ORDER — ATORVASTATIN CALCIUM 10 MG PO TABS: 10.0000 mg | ORAL_TABLET | Freq: Every day | ORAL | 5 refills | Status: DC

## 2020-07-06 NOTE — Progress Notes (Signed)
Patient ID: Rebecca Johnston, female    DOB: [birth year]  MRN: [id number]  CC: Annual Exam   Subjective: Rebecca Johnston is a 50 y.o. female who presents for annual exam Her concerns today include:  Pt with hx of obesity, DMwithneuropathy,obesity, CKD 3, tob dep, partial SZ,ACD, Vit D def, migraine w/o aura.  Had hysterectomy so does not need pap.  MMG up to date. Due for colon CA screen - no fhx history of colon CA Had eye exam today at [facility].  No retinopathy. Declines Pneumonia and shingles vaccine Had labs done 05/14/2020 through Kidney specialist.  DM: A1c was 7.9 done 05/14/2020 through her nephrologist.  On exenatide and taking consistently.  Checks blood sugars about twice a week in the mornings.  States her ranges around 100.  Doing better with eating habits.  Eats 3 meals a day.  She has cut back on fried foods and eating bread.  Drinks mainly water.  Not getting in much exercise.  Weight has increased 6 pounds since February.  Supposed to be on Lipitor but not taking it.  Had discussion with the nephrologist and plan was for her to get back on it to decrease cardiovascular risk.  Requests refill today.  Tobacco dependence: She quit last August.  Seizure disorder: Saw her neurologist Dr. Evelena Leyden back in February.  Keppra discontinued.  She is now on Topamax.  She is not had any recurrent seizures since the initial one that she had a few years ago.  Continues to be followed by the kidney specialist Dr. Royce Macadamia.  Recent GFR was 32 with creatinine of 1.8.  Kidney function is stable. Blood pressure noted to be elevated today.  I note that blood pressure in February also elevated at 118/95 at her visit with neurologist.  Patient Active Problem List   Diagnosis Date Noted  . Sleep apnea 04/17/2020  . Seizure ([place]) 04/17/2020  . Chronic migraine w/o aura w/o status migrainosus, not intractable 04/17/2020  . Chronic migraine 10/11/2019  . Hyperlipidemia associated with type 2  diabetes mellitus ([place]) 05/11/2019  . Elevated blood pressure reading 05/09/2019  . Anemia due to stage 3 chronic kidney disease ([place]) 07/27/2018  . Anemia of chronic disease 06/17/2018  . Vitamin D deficiency 06/15/2018  . Class 3 severe obesity due to excess calories with serious comorbidity and body mass index (BMI) of 40.0 to 44.9 in adult ([place]) 06/15/2018  . Seizure disorder ([place]) 05/14/2018  . GERD (gastroesophageal reflux disease) 05/07/2018  . CKD (chronic kidney disease), stage III ([place]) 04/16/2017  . Neuropathy 04/04/2016  . Seasonal allergic rhinitis 12/21/2015  . Type II diabetes mellitus with renal manifestations ([place]) 12/21/2015  . Incarcerated hernia s/p lap repair w mesh 12/08/2014 12/08/2014  . Tobacco dependence 10/21/2013  . Abnormal CT scan, chest 09/02/2013     Current Outpatient Medications on File Prior to Visit  Medication Sig Dispense Refill  . blood glucose meter kit and supplies Dispense based on patient and insurance preference. Use up to four times daily as directed. (FOR ICD-10 E10.9, E11.9). 1 each 0  . Exenatide ER (BYDUREON BCISE) 2 MG/0.85ML AUIJ Inject 2 mg into the skin once a week. 4 pen 2  . fluticasone (FLONASE) 50 MCG/ACT nasal spray USE 1 SPRAY(S) IN EACH NOSTRIL ONCE DAILY AS NEEDED FOR ALLERGIES OR  RHINITIS 16 g 2  . glucose blood test strip Check blood sugars three times a day before meals 100 each 12  . pantoprazole (PROTONIX) 40 MG  tablet Take 1 tablet (40 mg total) by mouth daily. 90 tablet 3  . SUMAtriptan (IMITREX) 50 MG tablet Take 1 tablet (50 mg total) by mouth every 2 (two) hours as needed for migraine. May repeat in 2 hours if headache persists or recurs. 12 tablet 11  . topiramate (TOPAMAX) 100 MG tablet Take 1 tablet (100 mg total) by mouth 2 (two) times daily. 180 tablet 4  . cetirizine (ZYRTEC) 10 MG tablet Take 1 tablet by mouth once daily (Patient not taking: Reported on 07/06/2020) 30 tablet 2  . cholecalciferol (VITAMIN D3) 25 MCG  (1000 UT) tablet Take 2,000 Units by mouth daily. (Patient not taking: Reported on 07/06/2020)    . venlafaxine XR (EFFEXOR XR) 37.5 MG 24 hr capsule Take 1 capsule (37.5 mg total) by mouth daily with breakfast. (Patient not taking: Reported on 07/06/2020) 30 capsule 11   No current facility-administered medications on file prior to visit.    Allergies  Allergen Reactions  . Doxycycline     "bad dreaming, hallucination, dizziness" per pt    Social History   Socioeconomic History  . Marital status: Single    Spouse name: Not on file  . Number of children: Not on file  . Years of education: Not on file  . Highest education level: Not on file  Occupational History  . Not on file  Tobacco Use  . Smoking status: Current Every Day Smoker    Packs/day: 0.25    Years: 25.00    Pack years: 6.25    Types: Cigarettes    Last attempt to quit: 08/02/2015    Years since quitting: 4.9  . Smokeless tobacco: Never Used  Substance and Sexual Activity  . Alcohol use: Not Currently    Alcohol/week: 0.0 standard drinks    Comment: socially  . Drug use: No  . Sexual activity: Not Currently    Birth control/protection: None  Other Topics Concern  . Not on file  Social History Narrative  . Not on file   Social Determinants of Health   Financial Resource Strain: Not on file  Food Insecurity: Not on file  Transportation Needs: Not on file  Physical Activity: Not on file  Stress: Not on file  Social Connections: Not on file  Intimate Partner Violence: Not on file    Family History  Problem Relation Age of Onset  . Hypertension Mother   . Diabetes Father   . Breast cancer Maternal Aunt   . Diabetes Maternal Aunt   . Diabetes Paternal Aunt   . Diabetes Maternal Aunt   . Diabetes Maternal Aunt     Past Surgical History:  Procedure Laterality Date  . ABDOMINAL HYSTERECTOMY N/A 12/08/2014   Procedure: HYSTERECTOMY ABDOMINAL;  Surgeon: Woodroe Mode, MD;  Location: [facility];  Service:  Gynecology;  Laterality: N/A;  . DILATION AND CURETTAGE OF UTERUS    . DILITATION & CURRETTAGE/HYSTROSCOPY WITH VERSAPOINT RESECTION N/A 01/06/2014   Procedure: Corena Pilgrim WITH VERSAPOINT;  Surgeon: Woodroe Mode, MD;  Location: [facility];  Service: Gynecology;  Laterality: N/A;  . LAPAROSCOPIC LYSIS OF ADHESIONS N/A 12/08/2014   Procedure: LAPAROSCOPIC LYSIS OF ADHESIONS;  Surgeon: Michael Boston, MD;  Location: [facility];  Service: General;  Laterality: N/A;  . OVARIAN CYST REMOVAL     cyst not removed. Cyst drained  . SUPRA-UMBILICAL HERNIA N/A 22/0/2542   Procedure: LAPARSCOPIC SUPRA-UMBILICAL HERNIA REPAIR ;  Surgeon: Michael Boston, MD;  Location: [facility];  Service: General;  Laterality: N/A;  .  VENTRAL HERNIA REPAIR N/A 12/08/2014   Procedure: LAPAROSCOPIC INCARCERATED INCISIONAL VENTRAL WALL HERNIA REPAIR;  Surgeon: Michael Boston, MD;  Location: [facility];  Service: General;  Laterality: N/A;    ROS: Review of Systems Negative except as stated above  PHYSICAL EXAM: BP 126/90   Pulse 91   Resp 20   Ht _0  (1.626 m)   Wt 282 lb 12.8 oz (128.3 kg)   LMP 11/26/2014 (Exact Date)   SpO2 95%   BMI 48.54 kg/m   Wt Readings from Last 3 Encounters:  07/06/20 282 lb 12.8 oz (128.3 kg)  04/17/20 276 lb 8 oz (125.4 kg)  10/11/19 269 lb 5 oz (122.2 kg)    Physical Exam  General appearance - alert, well appearing, obese middle-age African-American female and in no distress Mental status - normal mood, behavior, speech, dress, motor activity, and thought processes Eyes - pupils equal and reactive, extraocular eye movements intact Ears - bilateral TM's and external ear canals normal Nose - normal and patent, no erythema, discharge or polyps Mouth - mucous membranes moist, pharynx normal without lesions Neck - supple, no significant adenopathy Lymphatics - no palpable lymphadenopathy, no hepatosplenomegaly Chest - clear to auscultation, no wheezes, rales or rhonchi, symmetric air entry Heart -  normal rate, regular rhythm, normal S1, S2, no murmurs, rubs, clicks or gallops Abdomen - soft, nontender, nondistended, no masses or organomegaly Extremities - peripheral pulses normal, no pedal edema, no clubbing or cyanosis Skin - normal coloration and turgor, no rashes, no suspicious skin lesions noted Diabetic Foot Exam - Simple   Simple Foot Form Visual Inspection See comments: Yes Sensation Testing Intact to touch and monofilament testing bilaterally: Yes Pulse Check Posterior Tibialis and Dorsalis pulse intact bilaterally: Yes Comments Patient is flat-footed.      CMP Latest Ref Rng & Units 05/09/2019 05/25/2018 05/21/2018  Glucose 65 - 99 mg/dL 115(H) 113(H) 127(H)  BUN 6 - 24 mg/dL _1 Creatinine 0.57 - 1.00 mg/dL 1.96(H) 1.76(H) 1.68(H)  Sodium 134 - 144 mmol/L 140 138 135  Potassium 3.5 - 5.2 mmol/L 4.5 4.4 4.3  Chloride 96 - 106 mmol/L 105 105 106  CO2 20 - 29 mmol/L 21 20(L) 21(L)  Calcium 8.7 - 10.2 mg/dL 10.0 9.3 9.1  Total Protein 6.0 - 8.5 g/dL 7.7 - -  Total Bilirubin 0.0 - 1.2 mg/dL <0.2 - -  Alkaline Phos 39 - 117 IU/L 135(H) - -  AST 0 - 40 IU/L 19 - -  ALT 0 - 32 IU/L 16 - -   Lipid Panel     Component Value Date/Time   CHOL 189 05/09/2019 1153   TRIG 131 05/09/2019 1153   HDL 47 05/09/2019 1153   CHOLHDL 4.0 05/09/2019 1153   CHOLHDL 4.2 12/21/2015 1019   VLDL 34 (H) 12/21/2015 1019   LDLCALC 119 (H) 05/09/2019 1153    CBC    Component Value Date/Time   WBC 8.6 05/09/2019 1153   WBC 6.7 05/25/2018 0518   RBC 5.84 (H) 05/09/2019 1153   RBC 4.58 05/25/2018 0518   HGB 12.9 05/09/2019 1153   HCT 40.5 05/09/2019 1153   PLT 303 05/09/2019 1153   MCV 69 (L) 05/09/2019 1153   MCH 22.1 (L) 05/09/2019 1153   MCH 21.6 (L) 05/25/2018 0518   MCHC 31.9 05/09/2019 1153   MCHC 29.3 (L) 05/25/2018 0518   RDW 15.5 (H) 05/09/2019 1153   LYMPHSABS 1.4 05/17/2018 0640   MONOABS 0.7 05/17/2018 0640  EOSABS 0.2 05/17/2018 0640   BASOSABS 0.0  05/17/2018 0640    ASSESSMENT AND PLAN: 1. Annual physical exam Discussed the importance of healthy eating habits and regular exercise.  Printed information given on healthy eating habits.  Encouraged her to get in some form of moderate intensity exercise at least 3 to 4 days a week for 30 minutes.  Declines referral to medical weight management.  2. Morbid obesity ([place]) See #1 above  3. Type 2 diabetes mellitus with stage 3b chronic kidney disease, without long-term current use of insulin ([place]) Discussed the importance of good diabetes control to help preserve kidney function. A1c in March was not at goal.  Encourage patient to check blood sugar at least once a day for [place] to get a better knowledge of how her blood sugars are running.  Healthy eating habits and regular exercise discussed.  4. Elevated blood-pressure reading without diagnosis of hypertension Discussed the importance of good blood pressure control to help preserve kidney function. DASH diet recommended.  Follow-up with clinical pharmacist in 1 week for repeat blood pressure check.  If still elevated we will need to start medication  5. Pneumococcal vaccination declined by patient Recommended.  Patient declined.  She also declined shingles vaccine.  6. Screening for colon cancer - Ambulatory referral to Gastroenterology  7. Former smoker Mended on quitting.  Encouraged her to remain tobacco free  8. Hyperlipidemia associated with type 2 diabetes mellitus ([facility]) - atorvastatin (LIPITOR) 10 MG tablet; Take 1 tablet (10 mg total) by mouth daily.  Dispense: 30 tablet; Refill: 5    Patient was given the opportunity to ask questions.  Patient verbalized understanding of the plan and was able to repeat key elements of the plan.   Orders Placed This Encounter  Procedures  . Ambulatory referral to Gastroenterology     Requested Prescriptions   Signed Prescriptions Disp Refills  . atorvastatin (LIPITOR) 10 MG tablet 30  tablet 5    Sig: Take 1 tablet (10 mg total) by mouth daily.    Return in about 5 months (around 12/06/2020) for Give appt with [facility] in 1 mth for BP check.  Karle Plumber, MD, FACP

## 2020-07-06 NOTE — Patient Instructions (Signed)
Healthy Eating Following a healthy eating pattern may help you to achieve and maintain a healthy body weight, reduce the risk of chronic disease, and live a long and productive life. It is important to follow a healthy eating pattern at an appropriate calorie level for your body. Your nutritional needs should be met primarily through food by choosing a variety of nutrient-rich foods. What are tips for following this plan? Reading food labels  Read labels and choose the following: ? Reduced or low sodium. ? Juices with 100% fruit juice. ? Foods with low saturated fats and high polyunsaturated and monounsaturated fats. ? Foods with whole grains, such as whole wheat, cracked wheat, brown rice, and wild rice. ? Whole grains that are fortified with folic acid. This is recommended for women who are pregnant or who want to become pregnant.  Read labels and avoid the following: ? Foods with a lot of added sugars. These include foods that contain brown sugar, corn sweetener, corn syrup, dextrose, fructose, glucose, high-fructose corn syrup, honey, invert sugar, lactose, malt syrup, maltose, molasses, raw sugar, sucrose, trehalose, or turbinado sugar.  Do not eat more than the following amounts of added sugar per day:  6 teaspoons (25 g) for women.  9 teaspoons (38 g) for men. ? Foods that contain processed or refined starches and grains. ? Refined grain products, such as white flour, degermed cornmeal, white bread, and white rice. Shopping  Choose nutrient-rich snacks, such as vegetables, whole fruits, and nuts. Avoid high-calorie and high-sugar snacks, such as potato chips, fruit snacks, and candy.  Use oil-based dressings and spreads on foods instead of solid fats such as butter, stick margarine, or cream cheese.  Limit pre-made sauces, mixes, and "instant" products such as flavored rice, instant noodles, and ready-made pasta.  Try more plant-protein sources, such as tofu, tempeh, black beans,  edamame, lentils, nuts, and seeds.  Explore eating plans such as the Mediterranean diet or vegetarian diet. Cooking  Use oil to saut or stir-fry foods instead of solid fats such as butter, stick margarine, or lard.  Try baking, boiling, grilling, or broiling instead of frying.  Remove the fatty part of meats before cooking.  Steam vegetables in water or broth. Meal planning  At meals, imagine dividing your plate into fourths: ? One-half of your plate is fruits and vegetables. ? One-fourth of your plate is whole grains. ? One-fourth of your plate is protein, especially lean meats, poultry, eggs, tofu, beans, or nuts.  Include low-fat dairy as part of your daily diet.   Lifestyle  Choose healthy options in all settings, including home, work, school, restaurants, or stores.  Prepare your food safely: ? Wash your hands after handling raw meats. ? Keep food preparation surfaces clean by regularly washing with hot, soapy water. ? Keep raw meats separate from ready-to-eat foods, such as fruits and vegetables. ? Cook seafood, meat, poultry, and eggs to the recommended internal temperature. ? Store foods at safe temperatures. In general:  Keep cold foods at 7F (4.4C) or below.  Keep hot foods at 17F (60C) or above.  Keep your freezer at [facility] (-17.8C) or below.  Foods are no longer safe to eat when they have been between the temperatures of 40-17F (4.4-60C) for more than 2 hours. What foods should I eat? Fruits Aim to eat 2 cup-equivalents of fresh, canned (in natural juice), or frozen fruits each day. Examples of 1 cup-equivalent of fruit include 1 small apple, 8 large strawberries, 1 cup canned fruit,  cup dried fruit, or 1 cup 100% juice. Vegetables Aim to eat 2-3 cup-equivalents of fresh and frozen vegetables each day, including different varieties and colors. Examples of 1 cup-equivalent of vegetables include 2 medium carrots, 2 cups raw, leafy greens, 1 cup chopped  vegetable (raw or cooked), or 1 medium baked potato. Grains Aim to eat 6 ounce-equivalents of whole grains each day. Examples of 1 ounce-equivalent of grains include 1 slice of bread, 1 cup ready-to-eat cereal, 3 cups popcorn, or  cup cooked rice, pasta, or cereal. Meats and other proteins Aim to eat 5-6 ounce-equivalents of protein each day. Examples of 1 ounce-equivalent of protein include 1 egg, 1/2 cup nuts or seeds, or 1 tablespoon (16 g) peanut butter. A cut of meat or fish that is the size of a deck of cards is about 3-4 ounce-equivalents.  Of the protein you eat each week, try to have at least 8 ounces come from seafood. This includes salmon, trout, herring, and anchovies. Dairy Aim to eat 3 cup-equivalents of fat-free or low-fat dairy each day. Examples of 1 cup-equivalent of dairy include 1 cup (240 mL) milk, 8 ounces (250 g) yogurt, 1 ounces (44 g) natural cheese, or 1 cup (240 mL) fortified soy milk. Fats and oils  Aim for about 5 teaspoons (21 g) per day. Choose monounsaturated fats, such as canola and olive oils, avocados, peanut butter, and most nuts, or polyunsaturated fats, such as sunflower, corn, and soybean oils, walnuts, pine nuts, sesame seeds, sunflower seeds, and flaxseed. Beverages  Aim for six 8-oz glasses of water per day. Limit coffee to three to five 8-oz cups per day.  Limit caffeinated beverages that have added calories, such as soda and energy drinks.  Limit alcohol intake to no more than 1 drink a day for nonpregnant women and 2 drinks a day for men. One drink equals 12 oz of beer (355 mL), 5 oz of wine (148 mL), or 1 oz of hard liquor (44 mL). Seasoning and other foods  Avoid adding excess amounts of salt to your foods. Try flavoring foods with herbs and spices instead of salt.  Avoid adding sugar to foods.  Try using oil-based dressings, sauces, and spreads instead of solid fats. This information is based on general [place] nutrition guidelines. For more  information, visit choosemyplate.gov. Exact amounts may vary based on your nutrition needs. Summary  A healthy eating plan may help you to maintain a healthy weight, reduce the risk of chronic diseases, and stay active throughout your life.  Plan your meals. Make sure you eat the right portions of a variety of nutrient-rich foods.  Try baking, boiling, grilling, or broiling instead of frying.  Choose healthy options in all settings, including home, work, school, restaurants, or stores. This information is not intended to replace advice given to you by your health care provider. Make sure you discuss any questions you have with your health care provider. Document Revised: 06/01/2017 Document Reviewed: 06/01/2017 Elsevier Patient Education  2021 Elsevier Inc.  

## 2020-07-06 NOTE — Progress Notes (Signed)
Needs refill on Atorvastatin.

## 2020-07-22 DIAGNOSIS — H5213 Myopia, bilateral: Secondary | ICD-10-CM | POA: Diagnosis not present

## 2020-08-13 ENCOUNTER — Other Ambulatory Visit: Payer: Self-pay

## 2020-08-13 ENCOUNTER — Ambulatory Visit (INDEPENDENT_AMBULATORY_CARE_PROVIDER_SITE_OTHER): Payer: Medicaid Other

## 2020-08-13 ENCOUNTER — Ambulatory Visit
Admission: EM | Admit: 2020-08-13 | Discharge: 2020-08-13 | Disposition: A | Discharge status: Home / Self Care | Payer: Medicaid Other | Attending: Student | Admitting: Student

## 2020-08-13 DIAGNOSIS — E1169 Type 2 diabetes mellitus with other specified complication: Secondary | ICD-10-CM | POA: Diagnosis not present

## 2020-08-13 DIAGNOSIS — R059 Cough, unspecified: Secondary | ICD-10-CM

## 2020-08-13 DIAGNOSIS — R0602 Shortness of breath: Secondary | ICD-10-CM

## 2020-08-13 DIAGNOSIS — J208 Acute bronchitis due to other specified organisms: Secondary | ICD-10-CM

## 2020-08-13 DIAGNOSIS — U071 COVID-19: Secondary | ICD-10-CM

## 2020-08-13 DIAGNOSIS — N183 Chronic kidney disease, stage 3 unspecified: Secondary | ICD-10-CM

## 2020-08-13 MED ORDER — PROMETHAZINE-DM 6.25-15 MG/5ML PO SYRP: 2.5000 mL | ORAL_SOLUTION | Freq: Four times a day (QID) | ORAL | 0 refills | Status: DC | PRN

## 2020-08-13 MED ORDER — PREDNISONE 20 MG PO TABS: 20.0000 mg | ORAL_TABLET | Freq: Every day | ORAL | 0 refills | Status: AC

## 2020-08-13 NOTE — Discharge Instructions (Addendum)
-  Prednisone, 1 pill taken in the morning for 5 days in a row.  This can give you energy, so try to take earlier in the day.  This will open up your lungs and sinuses and allow you to breathe better. -Promethazine DM cough syrup for congestion/cough. This could make you drowsy, so take at night before bed. -Follow-up with primary care if symptoms worsen or persist.

## 2020-08-13 NOTE — ED Triage Notes (Addendum)
Patient presents to Urgent Care with complaints of covid positive 06/01, cough, and nasal congestion. Pt concerned with possible pneumonia. Treating symptoms mucinex and alka-Seltzer. Req. Chest x-ray.

## 2020-08-13 NOTE — ED Provider Notes (Signed)
[facility]    CSN: [id number] Arrival date & time: 08/13/20  1229      History   Chief Complaint Chief Complaint  Patient presents with   Cough   Nasal Congestion    HPI Rebecca Johnston is a 50 y.o. female presenting with continued cough and congestion, notes concern for possible pneumonia.  Medical history obesity, CKD, asthma, GERD, anemia, allergic rhinitis.  States she had a positive home COVID test on 6/1.  Her symptoms got a little bit better, but then got worse with cough and nasal congestion.  Distant history of pneumonia and states she is concerned about this again.  States she is a former smoker but denies history of pulmonary disease, COPD, asthma.  States her cough is hacking and productive of yellow sputum.  Dyspnea on exertion but denies shortness of breath at rest.  Denies dizziness, chest pain, fever/chills, nausea/vomiting/diarrhea.   HPI  Past Medical History:  Diagnosis Date   Abnormal CT scan, chest 09/02/2013   CTa 04/18/13  1. Study limited by body habitus with no pulmonary embolism  detected.  2. Multiple borderline and a few mildly enlarged nonspecific  mediastinal and hilar lymph nodes. Multiple pulmonary nodules  bilaterally the largest measuring 8 mm. If the patient is at high  risk for bronchogenic carcinoma, follow-up chest CT at 3-41month is  recommended.  - 12/15/13  1. Interval resolution of th   Anemia    Anemia due to stage 3 chronic kidney disease ([facility] 07/27/2018   Anemia of chronic disease 06/17/2018   06/2018   Asthma    CKD (chronic kidney disease), stage III ([facility] 04/16/2017   Class 3 severe obesity due to excess calories with serious comorbidity and body mass index (BMI) of 40.0 to 44.9 in adult ([facility] 06/15/2018   Diabetes mellitus without complication (HS[place]mayd 128/7681  Elevated blood pressure reading 05/09/2019   Fibroids    GERD (gastroesophageal reflux disease)    History of blood transfusion Feb 2015   Hyperlipidemia associated  with type 2 diabetes mellitus ([facility] 05/11/2019   Incarcerated hernia s/p lap repair w mesh 12/08/2014 12/08/2014   Neuropathy 04/04/2016   Obesity, Class III, BMI 40-49.9 (morbid obesity) ([facility]    Seasonal allergic rhinitis 12/21/2015   Seizure disorder ([place] 05/14/2018   Shortness of breath dyspnea    Tobacco dependence 10/21/2013   Type II diabetes mellitus with renal manifestations ([facility] 12/21/2015   Vitamin D deficiency 06/15/2018    Patient Active Problem List   Diagnosis Date Noted   Sleep apnea 04/17/2020   Seizure ([place] 04/17/2020   Chronic migraine w/o aura w/o status migrainosus, not intractable 04/17/2020   Chronic migraine 10/11/2019   Hyperlipidemia associated with type 2 diabetes mellitus ([facility] 05/11/2019   Elevated blood pressure reading 05/09/2019   Anemia due to stage 3 chronic kidney disease ([facility] 07/27/2018   Anemia of chronic disease 06/17/2018   Vitamin D deficiency 06/15/2018   Class 3 severe obesity due to excess calories with serious comorbidity and body mass index (BMI) of 40.0 to 44.9 in adult ([facility] 06/15/2018   Seizure disorder ([facility] 05/14/2018   GERD (gastroesophageal reflux disease) 05/07/2018   CKD (chronic kidney disease), stage III ([facility] 04/16/2017   Neuropathy 04/04/2016   Seasonal allergic rhinitis 12/21/2015   Type II diabetes mellitus with renal manifestations (HC[place] 12/21/2015   Incarcerated hernia s/p lap repair w mesh 12/08/2014 12/08/2014   Tobacco dependence 10/21/2013   Abnormal CT scan, chest 09/02/2013  Past Surgical History:  Procedure Laterality Date   ABDOMINAL HYSTERECTOMY N/A 12/08/2014   Procedure: HYSTERECTOMY ABDOMINAL;  Surgeon: Woodroe Mode, MD;  Location: [facility];  Service: Gynecology;  Laterality: N/A;   DILATION AND CURETTAGE OF UTERUS     DILITATION & CURRETTAGE/HYSTROSCOPY WITH VERSAPOINT RESECTION N/A 01/06/2014   Procedure: Corena Pilgrim WITH VERSAPOINT;  Surgeon: Woodroe Mode, MD;  Location: [facility];  Service: Gynecology;   Laterality: N/A;   LAPAROSCOPIC LYSIS OF ADHESIONS N/A 12/08/2014   Procedure: LAPAROSCOPIC LYSIS OF ADHESIONS;  Surgeon: Michael Boston, MD;  Location: [facility];  Service: General;  Laterality: N/A;   OVARIAN CYST REMOVAL     cyst not removed. Cyst drained   SUPRA-UMBILICAL HERNIA N/A 13/0/8657   Procedure: LAPARSCOPIC SUPRA-UMBILICAL HERNIA REPAIR ;  Surgeon: Michael Boston, MD;  Location: [facility];  Service: General;  Laterality: N/A;   VENTRAL HERNIA REPAIR N/A 12/08/2014   Procedure: LAPAROSCOPIC INCARCERATED INCISIONAL VENTRAL WALL HERNIA REPAIR;  Surgeon: Michael Boston, MD;  Location: [facility];  Service: General;  Laterality: N/A;    OB History     Gravida  1   Para  0   Term  0   Preterm  0   AB  1   Living  0      SAB  0   IAB  0   Ectopic  1   Multiple  0   Live Births               Home Medications    Prior to Admission medications   Medication Sig Start Date End Date Taking? Authorizing Provider  predniSONE (DELTASONE) 20 MG tablet Take 1 tablet (20 mg total) by mouth daily for 5 days. 08/13/20 08/18/20 Yes Hazel Sams, PA-C  promethazine-dextromethorphan (PROMETHAZINE-DM) 6.25-15 MG/5ML syrup Take 2.5 mLs by mouth 4 (four) times daily as needed for cough. 08/13/20  Yes Hazel Sams, PA-C  atorvastatin (LIPITOR) 10 MG tablet Take 1 tablet (10 mg total) by mouth daily. 07/06/20   Ladell Pier, MD  blood glucose meter kit and supplies Dispense based on patient and insurance preference. Use up to four times daily as directed. (FOR ICD-10 E10.9, E11.9). 05/26/18   Angiulli, Lavon Paganini, PA-C  cetirizine (ZYRTEC) 10 MG tablet Take 1 tablet by mouth once daily Patient not taking: Reported on 07/06/2020 07/15/19   Ladell Pier, MD  cholecalciferol (VITAMIN D3) 25 MCG (1000 UT) tablet Take 2,000 Units by mouth daily. Patient not taking: Reported on 07/06/2020    [provider]  Exenatide ER (BYDUREON BCISE) 2 MG/0.85ML AUIJ Inject 2 mg into the skin once a  week. 08/08/19   Ladell Pier, MD  fluticasone (FLONASE) 50 MCG/ACT nasal spray USE 1 SPRAY(S) IN EACH NOSTRIL ONCE DAILY AS NEEDED FOR ALLERGIES OR  RHINITIS 07/15/19   Ladell Pier, MD  glucose blood test strip Check blood sugars three times a day before meals 07/27/18   Ladell Pier, MD  pantoprazole (PROTONIX) 40 MG tablet Take 1 tablet (40 mg total) by mouth daily. 06/14/19   Sueanne Margarita, MD  SUMAtriptan (IMITREX) 50 MG tablet Take 1 tablet (50 mg total) by mouth every 2 (two) hours as needed for migraine. May repeat in 2 hours if headache persists or recurs. 04/17/20   Marcial Pacas, MD  topiramate (TOPAMAX) 100 MG tablet Take 1 tablet (100 mg total) by mouth 2 (two) times daily. 04/17/20   Marcial Pacas, MD  venlafaxine XR (  EFFEXOR XR) 37.5 MG 24 hr capsule Take 1 capsule (37.5 mg total) by mouth daily with breakfast. Patient not taking: Reported on 07/06/2020 04/17/20   Marcial Pacas, MD    Family History Family History  Problem Relation Age of Onset   Hypertension Mother    Diabetes Father    Breast cancer Maternal Aunt    Diabetes Maternal Aunt    Diabetes Paternal Aunt    Diabetes Maternal Aunt    Diabetes Maternal Aunt     Social History Social History   Tobacco Use   Smoking status: Every Day    Packs/day: 0.25    Years: 25.00    Pack years: 6.25    Types: Cigarettes    Last attempt to quit: 08/02/2015    Years since quitting: 5.0   Smokeless tobacco: Never  Substance Use Topics   Alcohol use: Not Currently    Alcohol/week: 0.0 standard drinks    Comment: socially   Drug use: No     Allergies   Doxycycline   Review of Systems Review of Systems  Constitutional:  Negative for appetite change, chills and fever.  HENT:  Positive for congestion. Negative for ear pain, rhinorrhea, sinus pressure, sinus pain and sore throat.   Eyes:  Negative for redness and visual disturbance.  Respiratory:  Positive for cough and shortness of breath. Negative for chest  tightness and wheezing.   Cardiovascular:  Negative for chest pain and palpitations.  Gastrointestinal:  Negative for abdominal pain, constipation, diarrhea, nausea and vomiting.  Genitourinary:  Negative for dysuria, frequency and urgency.  Musculoskeletal:  Negative for myalgias.  Neurological:  Negative for dizziness, weakness and headaches.  Psychiatric/Behavioral:  Negative for confusion.   All other systems reviewed and are negative.   Physical Exam Triage Vital Signs ED Triage Vitals  Enc Vitals Group     BP 08/13/20 1413 101/71     Pulse Rate 08/13/20 1413 99     Resp 08/13/20 1413 18     Temp 08/13/20 1413 99.3 F (37.4 C)     Temp Source 08/13/20 1413 Oral     SpO2 08/13/20 1413 95 %     Weight --      Height --      Head Circumference --      Peak Flow --      Pain Score 08/13/20 1412 0     Pain Loc --      Pain Edu? --      Excl. in [place]? --    No data found.  Updated Vital Signs BP 101/71 (BP Location: Left Arm)   Pulse 99   Temp 99.3 F (37.4 C) (Oral)   Resp 18   LMP 11/26/2014 (Exact Date)   SpO2 95%   Visual Acuity Right Eye Distance:   Left Eye Distance:   Bilateral Distance:    Right Eye Near:   Left Eye Near:    Bilateral Near:     Physical Exam Vitals reviewed.  Constitutional:      General: She is not in acute distress.    Appearance: Normal appearance. She is not ill-appearing.  HENT:     Head: Normocephalic and atraumatic.     Right Ear: Hearing, tympanic membrane, ear canal and external ear normal. No swelling or tenderness. There is no impacted cerumen. No mastoid tenderness. Tympanic membrane is not perforated, erythematous, retracted or bulging.     Left Ear: Hearing, tympanic membrane, ear canal and external ear normal. No  swelling or tenderness. There is no impacted cerumen. No mastoid tenderness. Tympanic membrane is not perforated, erythematous, retracted or bulging.     Nose:     Right Sinus: No maxillary sinus tenderness or  frontal sinus tenderness.     Left Sinus: No maxillary sinus tenderness or frontal sinus tenderness.     Mouth/Throat:     Mouth: Mucous membranes are moist.     Pharynx: Uvula midline. No oropharyngeal exudate or posterior oropharyngeal erythema.     Tonsils: No tonsillar exudate.  Cardiovascular:     Rate and Rhythm: Normal rate and regular rhythm.     Heart sounds: Normal heart sounds.  Pulmonary:     Breath sounds: Normal breath sounds and air entry. No wheezing, rhonchi or rales.     Comments: Occ cough Chest:     Chest wall: No tenderness.  Abdominal:     General: Abdomen is flat. Bowel sounds are normal.     Tenderness: There is no abdominal tenderness. There is no guarding or rebound.  Lymphadenopathy:     Cervical: No cervical adenopathy.  Neurological:     General: No focal deficit present.     Mental Status: She is alert and oriented to person, place, and time.  Psychiatric:        Attention and Perception: Attention and perception normal.        Mood and Affect: Mood and affect normal.        Behavior: Behavior normal. Behavior is cooperative.        Thought Content: Thought content normal.        Judgment: Judgment normal.     UC Treatments / Results  Labs (all labs ordered are listed, but only abnormal results are displayed) Labs Reviewed - No data to display  EKG   Radiology DG Chest 2 View  Result Date: 08/13/2020 CLINICAL DATA:  Cough and shortness of breath for several days, history of prior COVID-19 infection EXAM: CHEST - 2 VIEW COMPARISON:  05/12/2018 FINDINGS: Cardiac shadow is within normal limits. The lungs are mildly hypoinflated but clear. No effusion is seen. No bony abnormality is noted IMPRESSION: Poor inspiratory effort although no acute abnormality is noted. Electronically Signed   By: Inez Catalina M.D.   On: 08/13/2020 15:14    Procedures Procedures (including critical care time)  Medications Ordered in UC Medications - No data to  display  Initial Impression / Assessment and Plan / UC Course  I have reviewed the triage vital signs and the nursing notes.  Pertinent labs & imaging results that were available during my care of the patient were reviewed by me and considered in my medical decision making (see chart for details).     This patient is a 50 year old female presenting with viral bronchitis following covid-19. Home covid test was positive on 6/1. Pt is a former smoker but denies pulmonary disease.   CXR- Poor inspiratory effort although no acute abnormality is noted.  Reassurance provided. Plan to treat with prednisone as below. She states she A1c was 7.6 three months ago. Also sent promethazine DM for symptomatic relief.  Pt with concurrent CKD stage 3.  ED return precautions discussed. Patient verbalizes understanding and agreement.   Final Clinical Impressions(s) / UC Diagnoses   Final diagnoses:  Type 2 diabetes mellitus with other specified complication, without long-term current use of insulin (HCC)  COVID-19  Viral bronchitis  CKD stage 3 due to type 2 diabetes mellitus ([place])  Discharge Instructions      -Prednisone, 1 pill taken in the morning for 5 days in a row.  This can give you energy, so try to take earlier in the day.  This will open up your lungs and sinuses and allow you to breathe better. -Promethazine DM cough syrup for congestion/cough. This could make you drowsy, so take at night before bed. -Follow-up with primary care if symptoms worsen or persist.     ED Prescriptions     Medication Sig Dispense Auth. Provider   predniSONE (DELTASONE) 20 MG tablet Take 1 tablet (20 mg total) by mouth daily for 5 days. 5 tablet Hazel Sams, PA-C   promethazine-dextromethorphan (PROMETHAZINE-DM) 6.25-15 MG/5ML syrup Take 2.5 mLs by mouth 4 (four) times daily as needed for cough. 118 mL Hazel Sams, PA-C      PDMP not reviewed this encounter.   Hazel Sams,  PA-C 08/13/20 1648

## 2020-08-14 ENCOUNTER — Telehealth: Payer: Self-pay | Admitting: Internal Medicine

## 2020-08-14 NOTE — Telephone Encounter (Signed)
Copied from [place] [id number]. Topic: General - Inquiry >> Aug 14, 2020 10:23 AM Greggory Keen D wrote: Reason for CRM: Pt called saying she thought that all of her prescriptions was updated when she came in for her CPE on  07/06/20.  She said she is having a hard time getting  Her prescriptions when she calls the pharmacy.   Patient requesting refills on zyrtex, test strips, and flonase. Patient stated that she is not taking effexor.   Patient would like refills sent to the [facility] on [address]. Please advise if appropriate.

## 2020-08-15 ENCOUNTER — Other Ambulatory Visit: Payer: Self-pay

## 2020-08-15 ENCOUNTER — Other Ambulatory Visit: Payer: Self-pay | Admitting: Pharmacist

## 2020-08-15 DIAGNOSIS — N183 Chronic kidney disease, stage 3 unspecified: Secondary | ICD-10-CM

## 2020-08-15 DIAGNOSIS — J301 Allergic rhinitis due to pollen: Secondary | ICD-10-CM

## 2020-08-15 DIAGNOSIS — N1832 Chronic kidney disease, stage 3b: Secondary | ICD-10-CM

## 2020-08-15 MED ORDER — FLUTICASONE PROPIONATE 50 MCG/ACT NA SUSP: NASAL | 2 refills | Status: DC

## 2020-08-15 MED ORDER — ACCU-CHEK GUIDE ME W/DEVICE KIT: PACK | 0 refills | Status: DC

## 2020-08-15 MED ORDER — CETIRIZINE HCL 10 MG PO TABS: 10.0000 mg | ORAL_TABLET | Freq: Every day | ORAL | 2 refills | Status: DC

## 2020-08-15 MED ORDER — ACCU-CHEK GUIDE VI STRP: ORAL_STRIP | 2 refills | Status: DC

## 2020-08-15 MED ORDER — ACCU-CHEK SOFTCLIX LANCETS MISC: 2 refills | Status: AC

## 2020-08-15 NOTE — Telephone Encounter (Signed)
Rx's requested has been sent

## 2020-09-11 ENCOUNTER — Telehealth: Payer: Self-pay | Admitting: Internal Medicine

## 2020-09-11 MED ORDER — ACCU-CHEK AVIVA PLUS VI STRP: ORAL_STRIP | 2 refills | Status: DC

## 2020-09-11 NOTE — Telephone Encounter (Signed)
Sent the Aviva strips.

## 2020-09-11 NOTE — Telephone Encounter (Signed)
Pt called back in stating the wrong strips were sent over ,she states she needs the glucose blood (AVIA) test strips, just the strips and that's all.

## 2020-09-11 NOTE — Telephone Encounter (Signed)
Pt called stating that she is needing to have the accu check aviva plus strips sent to the pharmacy. She states that the wrong ones were sent in for her and is requesting to have the right ones sent in. Pt is very upset because she is out of her strips and is requesting to have this taken care of today. Please advise.       [place], [place] [address]  Phone: [phone] Fax: [phone]  Hours: Not open 24 hours

## 2020-09-27 ENCOUNTER — Other Ambulatory Visit: Payer: Self-pay

## 2020-09-27 ENCOUNTER — Ambulatory Visit: Payer: Self-pay | Admitting: *Deleted

## 2020-09-27 ENCOUNTER — Encounter (HOSPITAL_BASED_OUTPATIENT_CLINIC_OR_DEPARTMENT_OTHER): Payer: Self-pay | Admitting: *Deleted

## 2020-09-27 ENCOUNTER — Observation Stay (HOSPITAL_BASED_OUTPATIENT_CLINIC_OR_DEPARTMENT_OTHER)
Admission: EM | Admit: 2020-09-27 | Discharge: 2020-09-28 | Disposition: A | Discharge status: Home / Self Care | Payer: Medicaid Other | Attending: Family Medicine | Admitting: Family Medicine

## 2020-09-27 DIAGNOSIS — J45909 Unspecified asthma, uncomplicated: Secondary | ICD-10-CM | POA: Insufficient documentation

## 2020-09-27 DIAGNOSIS — N1831 Chronic kidney disease, stage 3a: Secondary | ICD-10-CM | POA: Insufficient documentation

## 2020-09-27 DIAGNOSIS — Z79899 Other long term (current) drug therapy: Secondary | ICD-10-CM | POA: Insufficient documentation

## 2020-09-27 DIAGNOSIS — G43909 Migraine, unspecified, not intractable, without status migrainosus: Secondary | ICD-10-CM | POA: Diagnosis not present

## 2020-09-27 DIAGNOSIS — H579 Unspecified disorder of eye and adnexa: Secondary | ICD-10-CM

## 2020-09-27 DIAGNOSIS — E1110 Type 2 diabetes mellitus with ketoacidosis without coma: Principal | ICD-10-CM | POA: Insufficient documentation

## 2020-09-27 DIAGNOSIS — E1122 Type 2 diabetes mellitus with diabetic chronic kidney disease: Secondary | ICD-10-CM | POA: Diagnosis not present

## 2020-09-27 DIAGNOSIS — Z794 Long term (current) use of insulin: Secondary | ICD-10-CM | POA: Diagnosis not present

## 2020-09-27 DIAGNOSIS — G40909 Epilepsy, unspecified, not intractable, without status epilepticus: Secondary | ICD-10-CM

## 2020-09-27 DIAGNOSIS — Z87891 Personal history of nicotine dependence: Secondary | ICD-10-CM | POA: Diagnosis not present

## 2020-09-27 DIAGNOSIS — N189 Chronic kidney disease, unspecified: Secondary | ICD-10-CM

## 2020-09-27 DIAGNOSIS — R030 Elevated blood-pressure reading, without diagnosis of hypertension: Secondary | ICD-10-CM | POA: Diagnosis present

## 2020-09-27 DIAGNOSIS — N179 Acute kidney failure, unspecified: Secondary | ICD-10-CM

## 2020-09-27 DIAGNOSIS — U071 COVID-19: Secondary | ICD-10-CM | POA: Insufficient documentation

## 2020-09-27 DIAGNOSIS — Z8616 Personal history of COVID-19: Secondary | ICD-10-CM

## 2020-09-27 LAB — RESP PANEL BY RT-PCR (FLU A&B, COVID) ARPGX2
Influenza A by PCR: NEGATIVE
Influenza B by PCR: NEGATIVE
SARS Coronavirus 2 by RT PCR: POSITIVE — AB

## 2020-09-27 LAB — I-STAT VENOUS BLOOD GAS, ED
Acid-base deficit: 6 mmol/L — ABNORMAL HIGH (ref 0.0–2.0)
Bicarbonate: 21.6 mmol/L (ref 20.0–28.0)
Calcium, Ion: 1.32 mmol/L (ref 1.15–1.40)
HCT: 53 % — ABNORMAL HIGH (ref 36.0–46.0)
Hemoglobin: 18 g/dL — ABNORMAL HIGH (ref 12.0–15.0)
O2 Saturation: 25 %
Patient temperature: 97.8
Potassium: 4.6 mmol/L (ref 3.5–5.1)
Sodium: 132 mmol/L — ABNORMAL LOW (ref 135–145)
TCO2: 23 mmol/L (ref 22–32)
pCO2, Ven: 46.2 mmHg (ref 44.0–60.0)
pH, Ven: 7.277 (ref 7.250–7.430)
pO2, Ven: 19 mmHg — CL (ref 32.0–45.0)

## 2020-09-27 LAB — BASIC METABOLIC PANEL
Anion gap: 16 — ABNORMAL HIGH (ref 5–15)
Anion gap: 17 — ABNORMAL HIGH (ref 5–15)
Anion gap: 10 (ref 5–15)
BUN: 22 mg/dL — ABNORMAL HIGH (ref 6–20)
BUN: 21 mg/dL — ABNORMAL HIGH (ref 6–20)
BUN: 18 mg/dL (ref 6–20)
CO2: 17 mmol/L — ABNORMAL LOW (ref 22–32)
CO2: 17 mmol/L — ABNORMAL LOW (ref 22–32)
CO2: 23 mmol/L (ref 22–32)
Calcium: 10.5 mg/dL — ABNORMAL HIGH (ref 8.9–10.3)
Calcium: 10.3 mg/dL (ref 8.9–10.3)
Calcium: 9.4 mg/dL (ref 8.9–10.3)
Chloride: 96 mmol/L — ABNORMAL LOW (ref 98–111)
Chloride: 96 mmol/L — ABNORMAL LOW (ref 98–111)
Chloride: 103 mmol/L (ref 98–111)
Creatinine, Ser: 1.88 mg/dL — ABNORMAL HIGH (ref 0.44–1.00)
Creatinine, Ser: 1.76 mg/dL — ABNORMAL HIGH (ref 0.44–1.00)
Creatinine, Ser: 1.47 mg/dL — ABNORMAL HIGH (ref 0.44–1.00)
GFR, Estimated: 32 mL/min — ABNORMAL LOW (ref >60)
GFR, Estimated: 35 mL/min — ABNORMAL LOW (ref >60)
GFR, Estimated: 43 mL/min — ABNORMAL LOW (ref >60)
Glucose, Bld: 490 mg/dL — ABNORMAL HIGH (ref 70–99)
Glucose, Bld: 428 mg/dL — ABNORMAL HIGH (ref 70–99)
Glucose, Bld: 181 mg/dL — ABNORMAL HIGH (ref 70–99)
Potassium: 4.8 mmol/L (ref 3.5–5.1)
Potassium: 4.6 mmol/L (ref 3.5–5.1)
Potassium: 3.8 mmol/L (ref 3.5–5.1)
Sodium: 129 mmol/L — ABNORMAL LOW (ref 135–145)
Sodium: 130 mmol/L — ABNORMAL LOW (ref 135–145)
Sodium: 136 mmol/L (ref 135–145)

## 2020-09-27 LAB — HEPATIC FUNCTION PANEL
ALT: 11 U/L (ref 0–44)
AST: 16 U/L (ref 15–41)
Albumin: 4.5 g/dL (ref 3.5–5.0)
Alkaline Phosphatase: 170 U/L — ABNORMAL HIGH (ref 38–126)
Bilirubin, Direct: 0.1 mg/dL (ref 0.0–0.2)
Indirect Bilirubin: 0.5 mg/dL (ref 0.3–0.9)
Total Bilirubin: 0.6 mg/dL (ref 0.3–1.2)
Total Protein: 9.1 g/dL — ABNORMAL HIGH (ref 6.5–8.1)

## 2020-09-27 LAB — URINALYSIS, ROUTINE W REFLEX MICROSCOPIC
Bilirubin Urine: NEGATIVE
Glucose, UA: >1000 mg/dL — AB
Hgb urine dipstick: NEGATIVE
Ketones, ur: 40 mg/dL — AB
Leukocytes,Ua: NEGATIVE
Nitrite: NEGATIVE
Protein, ur: 30 mg/dL — AB
Specific Gravity, Urine: 1.029 (ref 1.005–1.030)
pH: 5.5 (ref 5.0–8.0)

## 2020-09-27 LAB — CBC
HCT: 48.4 % — ABNORMAL HIGH (ref 36.0–46.0)
Hemoglobin: 14.8 g/dL (ref 12.0–15.0)
MCH: 21.2 pg — ABNORMAL LOW (ref 26.0–34.0)
MCHC: 30.6 g/dL (ref 30.0–36.0)
MCV: 69.3 fL — ABNORMAL LOW (ref 80.0–100.0)
Platelets: 277 10*3/uL (ref 150–400)
RBC: 6.98 MIL/uL — ABNORMAL HIGH (ref 3.87–5.11)
RDW: 17.3 % — ABNORMAL HIGH (ref 11.5–15.5)
WBC: 7.4 10*3/uL (ref 4.0–10.5)
nRBC: 0 % (ref 0.0–0.2)

## 2020-09-27 LAB — GLUCOSE, CAPILLARY
Glucose-Capillary: 182 mg/dL — ABNORMAL HIGH (ref 70–99)
Glucose-Capillary: 195 mg/dL — ABNORMAL HIGH (ref 70–99)
Glucose-Capillary: 173 mg/dL — ABNORMAL HIGH (ref 70–99)
Glucose-Capillary: 156 mg/dL — ABNORMAL HIGH (ref 70–99)
Glucose-Capillary: 162 mg/dL — ABNORMAL HIGH (ref 70–99)

## 2020-09-27 LAB — HEMOGLOBIN A1C
Hgb A1c MFr Bld: 14.9 % — ABNORMAL HIGH (ref 4.8–5.6)
Mean Plasma Glucose: 380.93 mg/dL

## 2020-09-27 LAB — CBG MONITORING, ED
Glucose-Capillary: 516 mg/dL (ref 70–99)
Glucose-Capillary: 448 mg/dL — ABNORMAL HIGH (ref 70–99)
Glucose-Capillary: 381 mg/dL — ABNORMAL HIGH (ref 70–99)
Glucose-Capillary: 248 mg/dL — ABNORMAL HIGH (ref 70–99)
Glucose-Capillary: 277 mg/dL — ABNORMAL HIGH (ref 70–99)
Glucose-Capillary: 179 mg/dL — ABNORMAL HIGH (ref 70–99)

## 2020-09-27 LAB — LIPASE, BLOOD: Lipase: 96 U/L — ABNORMAL HIGH (ref 11–51)

## 2020-09-27 LAB — MRSA NEXT GEN BY PCR, NASAL: MRSA by PCR Next Gen: NOT DETECTED

## 2020-09-27 LAB — BETA-HYDROXYBUTYRIC ACID
Beta-Hydroxybutyric Acid: 3.54 mmol/L — ABNORMAL HIGH (ref 0.05–0.27)
Beta-Hydroxybutyric Acid: 0.95 mmol/L — ABNORMAL HIGH (ref 0.05–0.27)

## 2020-09-27 MED ORDER — CHLORHEXIDINE GLUCONATE CLOTH 2 % EX PADS
6.0000 | MEDICATED_PAD | Freq: Every day | CUTANEOUS | Status: DC
  Administered 2020-09-27 – 2020-09-28 (×2): 6 via TOPICAL
  Filled 2020-09-27: qty 6

## 2020-09-27 MED ORDER — TOPIRAMATE 100 MG PO TABS
100.0000 mg | ORAL_TABLET | Freq: Two times a day (BID) | ORAL | Status: DC
  Administered 2020-09-27 – 2020-09-28 (×2): 100 mg via ORAL
  Filled 2020-09-27 (×2): qty 1

## 2020-09-27 MED ORDER — INSULIN DETEMIR 100 UNIT/ML ~~LOC~~ SOLN
15.0000 [IU] | Freq: Two times a day (BID) | SUBCUTANEOUS | Status: DC
  Administered 2020-09-27 – 2020-09-28 (×2): 15 [IU] via SUBCUTANEOUS
  Filled 2020-09-27 (×3): qty 0.15

## 2020-09-27 MED ORDER — POTASSIUM CHLORIDE 10 MEQ/100ML IV SOLN
INTRAVENOUS | Status: AC
  Administered 2020-09-27: 10 meq via INTRAVENOUS
  Filled 2020-09-27: qty 100

## 2020-09-27 MED ORDER — PANTOPRAZOLE SODIUM 40 MG PO TBEC
40.0000 mg | DELAYED_RELEASE_TABLET | Freq: Every day | ORAL | Status: DC
  Administered 2020-09-28: 40 mg via ORAL
  Filled 2020-09-27: qty 1

## 2020-09-27 MED ORDER — ACETAMINOPHEN 325 MG PO TABS
650.0000 mg | ORAL_TABLET | Freq: Four times a day (QID) | ORAL | Status: DC | PRN
  Administered 2020-09-27: 650 mg via ORAL
  Filled 2020-09-27: qty 2

## 2020-09-27 MED ORDER — LACTATED RINGERS IV SOLN: INTRAVENOUS | Status: DC

## 2020-09-27 MED ORDER — PROCHLORPERAZINE EDISYLATE 10 MG/2ML IJ SOLN
10.0000 mg | Freq: Once | INTRAMUSCULAR | Status: AC
  Administered 2020-09-27: 10 mg via INTRAVENOUS
  Filled 2020-09-27: qty 2

## 2020-09-27 MED ORDER — ORAL CARE MOUTH RINSE
15.0000 mL | Freq: Two times a day (BID) | OROMUCOSAL | Status: DC
  Administered 2020-09-27 – 2020-09-28 (×2): 15 mL via OROMUCOSAL
  Filled 2020-09-27: qty 15

## 2020-09-27 MED ORDER — DEXTROSE 50 % IV SOLN: 0.0000 mL | INTRAVENOUS | Status: DC | PRN

## 2020-09-27 MED ORDER — INSULIN REGULAR(HUMAN) IN NACL 100-0.9 UT/100ML-% IV SOLN
INTRAVENOUS | Status: DC
  Administered 2020-09-27: 13 [IU]/h via INTRAVENOUS
  Filled 2020-09-27: qty 100

## 2020-09-27 MED ORDER — DEXTROSE IN LACTATED RINGERS 5 % IV SOLN: INTRAVENOUS | Status: DC

## 2020-09-27 MED ORDER — LIP MEDEX EX OINT
TOPICAL_OINTMENT | CUTANEOUS | Status: DC | PRN
  Filled 2020-09-27: qty 7

## 2020-09-27 MED ORDER — LACTATED RINGERS IV BOLUS
20.0000 mL/kg | Freq: Once | INTRAVENOUS | Status: AC
  Administered 2020-09-27: 1000 mL via INTRAVENOUS

## 2020-09-27 MED ORDER — ENOXAPARIN SODIUM 40 MG/0.4ML IJ SOSY
40.0000 mg | PREFILLED_SYRINGE | INTRAMUSCULAR | Status: DC
  Administered 2020-09-27: 40 mg via SUBCUTANEOUS
  Filled 2020-09-27: qty 0.4

## 2020-09-27 MED ORDER — INSULIN ASPART 100 UNIT/ML IJ SOLN
0.0000 [IU] | Freq: Three times a day (TID) | INTRAMUSCULAR | Status: DC
  Administered 2020-09-28 (×2): 11 [IU] via SUBCUTANEOUS
  Administered 2020-09-28: 20 [IU] via SUBCUTANEOUS
  Administered 2020-09-28: 7 [IU] via SUBCUTANEOUS

## 2020-09-27 MED ORDER — POTASSIUM CHLORIDE 10 MEQ/100ML IV SOLN
10.0000 meq | INTRAVENOUS | Status: AC
Start: 2020-09-27 — End: 2020-09-27
  Administered 2020-09-27: 10 meq via INTRAVENOUS
  Filled 2020-09-27: qty 100

## 2020-09-27 MED ORDER — LORATADINE 10 MG PO TABS
10.0000 mg | ORAL_TABLET | Freq: Every day | ORAL | Status: DC
  Administered 2020-09-28: 10 mg via ORAL
  Filled 2020-09-27: qty 1

## 2020-09-27 NOTE — ED Notes (Signed)
Pt complained of pain in her arm related blood pressure cuff being to tight. BP cuuf moved to right lower extrimity.

## 2020-09-27 NOTE — ED Provider Notes (Signed)
[facility] EMERGENCY DEPT Provider Note   CSN: [id number] Arrival date & time: 09/27/20  1009     History Chief Complaint  Patient presents with   Hyperglycemia    Rebecca Johnston is a 50 y.o. female.  HPI Reports her blood sugars have gotten elevated and are consistently 400s now.  She reports they have stayed high for a week.  She reports she takes Exenetide ER once weekly.  Last dose was taken Tuesday, 2 days ago.  She reports typically she takes it on Thursdays.  She reports she might of gotten off schedule last week.  Patient to health and wellness for a check and was referred to the emergency department.  Patient reports she has been getting flashing light in the corner of her left eye and she attributes that to her blood sugar being elevated.  No changes in vision.  No blurred vision or loss of vision.  No headache or eye pain.  Patient denies vomiting, diarrhea or abdominal pain.    Past Medical History:  Diagnosis Date   Abnormal CT scan, chest 09/02/2013   CTa 04/18/13  1. Study limited by body habitus with no pulmonary embolism  detected.  2. Multiple borderline and a few mildly enlarged nonspecific  mediastinal and hilar lymph nodes. Multiple pulmonary nodules  bilaterally the largest measuring 8 mm. If the patient is at high  risk for bronchogenic carcinoma, follow-up chest CT at 3-26month is  recommended.  - 12/15/13  1. Interval resolution of th   Anemia    Anemia due to stage 3 chronic kidney disease ([facility] 07/27/2018   Anemia of chronic disease 06/17/2018   06/2018   Asthma    CKD (chronic kidney disease), stage III ([facility] 04/16/2017   Class 3 severe obesity due to excess calories with serious comorbidity and body mass index (BMI) of 40.0 to 44.9 in adult ([facility] 06/15/2018   Diabetes mellitus without complication ([facility] 175/8832  Elevated blood pressure reading 05/09/2019   Fibroids    GERD (gastroesophageal reflux disease)    History of blood transfusion Feb  2015   Hyperlipidemia associated with type 2 diabetes mellitus ([facility] 05/11/2019   Incarcerated hernia s/p lap repair w mesh 12/08/2014 12/08/2014   Neuropathy 04/04/2016   Obesity, Class III, BMI 40-49.9 (morbid obesity) ([facility]    Seasonal allergic rhinitis 12/21/2015   Seizure disorder ([facility] [place] 05/14/2018   Shortness of breath dyspnea    Tobacco dependence 10/21/2013   Type II diabetes mellitus with renal manifestations ([facility] 12/21/2015   Vitamin D deficiency 06/15/2018    Patient Active Problem List   Diagnosis Date Noted   Sleep apnea 04/17/2020   Seizure ([facility] 04/17/2020   Chronic migraine w/o aura w/o status migrainosus, not intractable 04/17/2020   Chronic migraine 10/11/2019   Hyperlipidemia associated with type 2 diabetes mellitus ([facility] 05/11/2019   Elevated blood pressure reading 05/09/2019   Anemia due to stage 3 chronic kidney disease ([facility] 07/27/2018   Anemia of chronic disease 06/17/2018   Vitamin D deficiency 06/15/2018   Class 3 severe obesity due to excess calories with serious comorbidity and body mass index (BMI) of 40.0 to 44.9 in adult ([facility] 06/15/2018   Seizure disorder ([facility] 05/14/2018   GERD (gastroesophageal reflux disease) 05/07/2018   CKD (chronic kidney disease), stage III ([facility] 04/16/2017   Neuropathy 04/04/2016   Seasonal allergic rhinitis 12/21/2015   Type II diabetes mellitus with renal manifestations ([facility] 12/21/2015   Incarcerated hernia s/p lap repair w mesh  12/08/2014 12/08/2014   Tobacco dependence 10/21/2013   Abnormal CT scan, chest 09/02/2013    Past Surgical History:  Procedure Laterality Date   ABDOMINAL HYSTERECTOMY N/A 12/08/2014   Procedure: HYSTERECTOMY ABDOMINAL;  Surgeon: Woodroe Mode, MD;  Location: [facility];  Service: Gynecology;  Laterality: N/A;   DILATION AND CURETTAGE OF UTERUS     DILITATION & CURRETTAGE/HYSTROSCOPY WITH VERSAPOINT RESECTION N/A 01/06/2014   Procedure: Corena Pilgrim WITH VERSAPOINT;  Surgeon: Woodroe Mode, MD;   Location: [facility];  Service: Gynecology;  Laterality: N/A;   LAPAROSCOPIC LYSIS OF ADHESIONS N/A 12/08/2014   Procedure: LAPAROSCOPIC LYSIS OF ADHESIONS;  Surgeon: Michael Boston, MD;  Location: [facility];  Service: General;  Laterality: N/A;   OVARIAN CYST REMOVAL     cyst not removed. Cyst drained   SUPRA-UMBILICAL HERNIA N/A 58/10/5025   Procedure: LAPARSCOPIC SUPRA-UMBILICAL HERNIA REPAIR ;  Surgeon: Michael Boston, MD;  Location: [facility];  Service: General;  Laterality: N/A;   VENTRAL HERNIA REPAIR N/A 12/08/2014   Procedure: LAPAROSCOPIC INCARCERATED INCISIONAL VENTRAL WALL HERNIA REPAIR;  Surgeon: Michael Boston, MD;  Location: [facility];  Service: General;  Laterality: N/A;     OB History     Gravida  1   Para  0   Term  0   Preterm  0   AB  1   Living  0      SAB  0   IAB  0   Ectopic  1   Multiple  0   Live Births              Family History  Problem Relation Age of Onset   Hypertension Mother    Diabetes Father    Breast cancer Maternal Aunt    Diabetes Maternal Aunt    Diabetes Paternal Aunt    Diabetes Maternal Aunt    Diabetes Maternal Aunt     Social History   Tobacco Use   Smoking status: Former    Packs/day: 0.25    Years: 25.00    Pack years: 6.25    Types: Cigarettes    Quit date: 08/02/2015    Years since quitting: 5.1   Smokeless tobacco: Never  Vaping Use   Vaping Use: Never used  Substance Use Topics   Alcohol use: Not Currently    Alcohol/week: 0.0 standard drinks    Comment: socially   Drug use: No    Home Medications Prior to Admission medications   Medication Sig Start Date End Date Taking? Authorizing Provider  Exenatide ER (BYDUREON BCISE) 2 MG/0.85ML AUIJ Inject 2 mg into the skin once a week. 08/08/19  Yes Ladell Pier, MD  fluticasone (FLONASE) 50 MCG/ACT nasal spray USE 1 SPRAY(S) IN EACH NOSTRIL ONCE DAILY AS NEEDED FOR ALLERGIES OR  RHINITIS 08/15/20  Yes Ladell Pier, MD  pantoprazole (PROTONIX) 40 MG tablet Take  1 tablet (40 mg total) by mouth daily. 06/14/19  Yes Turner, Eber Hong, MD  topiramate (TOPAMAX) 100 MG tablet Take 1 tablet (100 mg total) by mouth 2 (two) times daily. 04/17/20  Yes Marcial Pacas, MD  Accu-Chek Softclix Lancets lancets Use to check blood sugar once daily. 08/15/20   Ladell Pier, MD  atorvastatin (LIPITOR) 10 MG tablet Take 1 tablet (10 mg total) by mouth daily. 07/06/20   Ladell Pier, MD  blood glucose meter kit and supplies Dispense based on patient and insurance preference. Use up to four times daily as directed. (FOR ICD-10 E10.9,  E11.9). 05/26/18   Angiulli, Lavon Paganini, PA-C  Blood Glucose Monitoring Suppl (ACCU-CHEK GUIDE ME) w/Device KIT Use to check blood sugar once daily. 08/15/20   Ladell Pier, MD  cetirizine (ZYRTEC) 10 MG tablet Take 1 tablet (10 mg total) by mouth daily. 08/15/20   Ladell Pier, MD  cholecalciferol (VITAMIN D3) 25 MCG (1000 UT) tablet Take 2,000 Units by mouth daily. Patient not taking: Reported on 07/06/2020    [provider]  glucose blood (ACCU-CHEK AVIVA PLUS) test strip Use to check blood sugar once daily. 09/11/20   Ladell Pier, MD  promethazine-dextromethorphan (PROMETHAZINE-DM) 6.25-15 MG/5ML syrup Take 2.5 mLs by mouth 4 (four) times daily as needed for cough. 08/13/20   Hazel Sams, PA-C  SUMAtriptan (IMITREX) 50 MG tablet Take 1 tablet (50 mg total) by mouth every 2 (two) hours as needed for migraine. May repeat in 2 hours if headache persists or recurs. 04/17/20   Marcial Pacas, MD  venlafaxine XR (EFFEXOR XR) 37.5 MG 24 hr capsule Take 1 capsule (37.5 mg total) by mouth daily with breakfast. Patient not taking: Reported on 07/06/2020 04/17/20   Marcial Pacas, MD    Allergies    Doxycycline  Review of Systems   Review of Systems 10 systems reviewed and negative except as per HPI Physical Exam Updated Vital Signs BP 115/87   Pulse 96   Temp 97.8 F (36.6 C)   Resp 18   Ht '5\' 4"'  (1.626 m)   Wt 125.2 kg   LMP  11/26/2014 (Exact Date)   SpO2 96%   BMI 47.38 kg/m   Physical Exam Constitutional:      Comments: Alert and clear mental status.  No respiratory distress.  HENT:     Head: Normocephalic and atraumatic.     Mouth/Throat:     Mouth: Mucous membranes are dry.     Pharynx: Oropharynx is clear.  Eyes:     Extraocular Movements: Extraocular movements intact.     Comments: No erythema or injection of the eye.  Extraocular motions are normal.  Pupils are symmetric and responsive.  Cardiovascular:     Rate and Rhythm: Normal rate and regular rhythm.  Pulmonary:     Effort: Pulmonary effort is normal.     Breath sounds: Normal breath sounds.  Abdominal:     General: There is no distension.     Palpations: Abdomen is soft.     Tenderness: There is no abdominal tenderness.  Musculoskeletal:        General: No swelling. Normal range of motion.     Cervical back: Neck supple.     Right lower leg: No edema.     Left lower leg: No edema.  Skin:    General: Skin is warm and dry.  Neurological:     General: No focal deficit present.     Mental Status: She is oriented to person, place, and time.     Cranial Nerves: No cranial nerve deficit.     Motor: No weakness.  Psychiatric:        Mood and Affect: Mood normal.    ED Results / Procedures / Treatments   Labs (all labs ordered are listed, but only abnormal results are displayed) Labs Reviewed  BASIC METABOLIC PANEL - Abnormal; Notable for the following components:      Result Value   Sodium 129 (*)    Chloride 96 (*)    CO2 17 (*)    Glucose, Bld 490 (*)  BUN 22 (*)    Creatinine, Ser 1.88 (*)    Calcium 10.5 (*)    GFR, Estimated 32 (*)    Anion gap 16 (*)    All other components within normal limits  CBC - Abnormal; Notable for the following components:   RBC 6.98 (*)    HCT 48.4 (*)    MCV 69.3 (*)    MCH 21.2 (*)    RDW 17.3 (*)    All other components within normal limits  CBG MONITORING, ED - Abnormal; Notable  for the following components:   Glucose-Capillary 516 (*)    All other components within normal limits  RESP PANEL BY RT-PCR (FLU A&B, COVID) ARPGX2  URINALYSIS, ROUTINE W REFLEX MICROSCOPIC  HEPATIC FUNCTION PANEL  HEMOGLOBIN A1C  LIPASE, BLOOD  BASIC METABOLIC PANEL  BASIC METABOLIC PANEL  BASIC METABOLIC PANEL  BASIC METABOLIC PANEL  BETA-HYDROXYBUTYRIC ACID  BETA-HYDROXYBUTYRIC ACID  I-STAT VENOUS BLOOD GAS, ED  CBG MONITORING, ED    EKG None  Radiology No results found.  Procedures Procedures  CRITICAL CARE Performed by: Charlesetta Shanks   Total critical care time: 30 minutes  Critical care time was exclusive of separately billable procedures and treating other patients.  Critical care was necessary to treat or prevent imminent or life-threatening deterioration.  Critical care was time spent personally by me on the following activities: development of treatment plan with patient and/or surrogate as well as nursing, discussions with consultants, evaluation of patient's response to treatment, examination of patient, obtaining history from patient or surrogate, ordering and performing treatments and interventions, ordering and review of laboratory studies, ordering and review of radiographic studies, pulse oximetry and re-evaluation of patient's condition.  Medications Ordered in ED Medications  lactated ringers bolus 2,504 mL (has no administration in time range)  insulin regular, human (MYXREDLIN) 100 units/ 100 mL infusion (has no administration in time range)  lactated ringers infusion (has no administration in time range)  dextrose 5 % in lactated ringers infusion (has no administration in time range)  dextrose 50 % solution 0-50 mL (has no administration in time range)  potassium chloride 10 mEq in 100 mL IVPB (has no administration in time range)    ED Course  I have reviewed the triage vital signs and the nursing notes.  Pertinent labs & imaging results that  were available during my care of the patient were reviewed by me and considered in my medical decision making (see chart for details).    MDM Rules/Calculators/A&P                           Patient has had consistently elevated blood sugars for a week.  Blood sugars here in the 500s with anion gap.  Patient was referred from outpatient office for assessment and treatment.  Patient's mental status is clear.  No confusion.  No respiratory distress at this time with anion gap and significantly blood sugar consistent with DKA.  We will start rehydration and insulin.  Plan for admission. Final Clinical Impression(s) / ED Diagnoses Final diagnoses:  Diabetic ketoacidosis without coma associated with type 2 diabetes mellitus ([place])    Rx / DC Orders ED Discharge Orders     None        Charlesetta Shanks, MD 09/28/20 [phone]

## 2020-09-27 NOTE — Telephone Encounter (Signed)
Pt reports hyperglycemia last 2 weeks, "Sometimes 500's." Yesterday 466, 433, during call 402, fasting. Takes Exenatide ER weekly on Mondays, no missed doses. Reports  other values from June low 300's mid 200's range.Reports increased thirst, frequent urination. Also reports "Flashing light in my left eye for a few days." Advised ED. Pt states will follow disposition.

## 2020-09-27 NOTE — H&P (Addendum)
History and Physical    Rebecca Johnston TDS:[id number] DOB: [birth year] DOA: 09/27/2020  PCP: Ladell Pier, MD  Patient coming from: [place]  I have personally briefly reviewed patient's old medical records in [place]  Chief Complaint: hyperglycemia  HPI: Rebecca Johnston is a 50 y.o. female with medical history significant for uncontrolled type 2 diabetes, chronic migraine, OSA, history of seizure, CKD stage III and hyperlipidemia who presents from outside ED for concerns of hyperglycemia.  Patient reports that her sugar has been running high usually around 300s.  Also for the past few days has noticed flashing to the corner of her left eye which she thinks may be due to her hyperglycemia.  She has history of chronic migraine but denies any visual disturbances with them.  She is on exenatide once weekly with last dose on Tuesday which was 2 days ago.  She denies any nausea, vomiting or diarrhea.  No abdominal pain.  No blurred or double vision.  No eye pain.  No focal extremity weakness.  ED Course: She was afebrile normotensive on room air.  Blood glucose initially of 490 with sodium of 129.  Anion gap 16.  pH of 7.317.  Creatinine of 1.88 with baseline appearing around 1.5-1.8. She was initiated on insulin infusion and was transferred here to [facility] for hospitalist admission.  COVID PCR also resulted positive but patient denies any symptoms and states she was positive back in May.  Review of Systems:  Constitutional: No Weight Change, No Fever ENT/Mouth: No sore throat, No Rhinorrhea Eyes: No Eye Pain, +Vision Changes Cardiovascular: No Chest Pain, no SOB Respiratory: No Cough, No Sputum, No Wheezing, no Dyspnea  Gastrointestinal: No Nausea, No Vomiting, No Diarrhea, No Constipation, No Pain Genitourinary: no Urinary Incontinence, No Urgency, No Flank Pain Musculoskeletal: No Arthralgias, No Myalgias Skin: No Skin Lesions, No Pruritus, Neuro: no  Weakness, No Numbness,  No Loss of Consciousness, No Syncope Psych: No Anxiety/Panic, No Depression, no decrease appetite Heme/Lymph: No Bruising, No Bleeding  Past Medical History:  Diagnosis Date   Abnormal CT scan, chest 09/02/2013   CTa 04/18/13  1. Study limited by body habitus with no pulmonary embolism  detected.  2. Multiple borderline and a few mildly enlarged nonspecific  mediastinal and hilar lymph nodes. Multiple pulmonary nodules  bilaterally the largest measuring 8 mm. If the patient is at high  risk for bronchogenic carcinoma, follow-up chest CT at 3-29month is  recommended.  - 12/15/13  1. Interval resolution of th   Anemia    Anemia due to stage 3 chronic kidney disease ([facility] 07/27/2018   Anemia of chronic disease 06/17/2018   06/2018   Asthma    CKD (chronic kidney disease), stage III ([facility] 04/16/2017   Class 3 severe obesity due to excess calories with serious comorbidity and body mass index (BMI) of 40.0 to 44.9 in adult ([facility] 06/15/2018   Diabetes mellitus without complication ([facility] 126/2035  Elevated blood pressure reading 05/09/2019   Fibroids    GERD (gastroesophageal reflux disease)    History of blood transfusion Feb 2015   Hyperlipidemia associated with type 2 diabetes mellitus ([facility] 05/11/2019   Incarcerated hernia s/p lap repair w mesh 12/08/2014 12/08/2014   Neuropathy 04/04/2016   Obesity, Class III, BMI 40-49.9 (morbid obesity) ([facility]    Seasonal allergic rhinitis 12/21/2015   Seizure disorder ([facility] 05/14/2018   Shortness of breath dyspnea    Tobacco dependence 10/21/2013   Type II diabetes mellitus with  renal manifestations ([place]) 12/21/2015   Vitamin D deficiency 06/15/2018    Past Surgical History:  Procedure Laterality Date   ABDOMINAL HYSTERECTOMY N/A 12/08/2014   Procedure: HYSTERECTOMY ABDOMINAL;  Surgeon: Woodroe Mode, MD;  Location: [facility];  Service: Gynecology;  Laterality: N/A;   DILATION AND CURETTAGE OF UTERUS     DILITATION & CURRETTAGE/HYSTROSCOPY WITH  VERSAPOINT RESECTION N/A 01/06/2014   Procedure: Corena Pilgrim WITH VERSAPOINT;  Surgeon: Woodroe Mode, MD;  Location: [facility];  Service: Gynecology;  Laterality: N/A;   LAPAROSCOPIC LYSIS OF ADHESIONS N/A 12/08/2014   Procedure: LAPAROSCOPIC LYSIS OF ADHESIONS;  Surgeon: Michael Boston, MD;  Location: [facility];  Service: General;  Laterality: N/A;   OVARIAN CYST REMOVAL     cyst not removed. Cyst drained   SUPRA-UMBILICAL HERNIA N/A 01/06/5789   Procedure: LAPARSCOPIC SUPRA-UMBILICAL HERNIA REPAIR ;  Surgeon: Michael Boston, MD;  Location: [facility];  Service: General;  Laterality: N/A;   VENTRAL HERNIA REPAIR N/A 12/08/2014   Procedure: LAPAROSCOPIC INCARCERATED INCISIONAL VENTRAL WALL HERNIA REPAIR;  Surgeon: Michael Boston, MD;  Location: [facility];  Service: General;  Laterality: N/A;     reports that she quit smoking about 5 years ago. Her smoking use included cigarettes. She has a 6.25 pack-year smoking history. She has never used smokeless tobacco. She reports previous alcohol use. She reports that she does not use drugs. Social History  Allergies  Allergen Reactions   Doxycycline     "bad dreaming, hallucination, dizziness" per pt    Family History  Problem Relation Age of Onset   Hypertension Mother    Diabetes Father    Breast cancer Maternal Aunt    Diabetes Maternal Aunt    Diabetes Paternal Aunt    Diabetes Maternal Aunt    Diabetes Maternal Aunt      Prior to Admission medications   Medication Sig Start Date End Date Taking? Authorizing Provider  Exenatide ER (BYDUREON BCISE) 2 MG/0.85ML AUIJ Inject 2 mg into the skin once a week. 08/08/19  Yes Ladell Pier, MD  fluticasone (FLONASE) 50 MCG/ACT nasal spray USE 1 SPRAY(S) IN EACH NOSTRIL ONCE DAILY AS NEEDED FOR ALLERGIES OR  RHINITIS 08/15/20  Yes Ladell Pier, MD  pantoprazole (PROTONIX) 40 MG tablet Take 1 tablet (40 mg total) by mouth daily. 06/14/19  Yes Turner, Eber Hong, MD  topiramate (TOPAMAX) 100 MG tablet Take 1  tablet (100 mg total) by mouth 2 (two) times daily. 04/17/20  Yes Marcial Pacas, MD  Accu-Chek Softclix Lancets lancets Use to check blood sugar once daily. 08/15/20   Ladell Pier, MD  atorvastatin (LIPITOR) 10 MG tablet Take 1 tablet (10 mg total) by mouth daily. 07/06/20   Ladell Pier, MD  blood glucose meter kit and supplies Dispense based on patient and insurance preference. Use up to four times daily as directed. (FOR ICD-10 E10.9, E11.9). 05/26/18   Angiulli, Lavon Paganini, PA-C  Blood Glucose Monitoring Suppl (ACCU-CHEK GUIDE ME) w/Device KIT Use to check blood sugar once daily. 08/15/20   Ladell Pier, MD  cetirizine (ZYRTEC) 10 MG tablet Take 1 tablet (10 mg total) by mouth daily. 08/15/20   Ladell Pier, MD  cholecalciferol (VITAMIN D3) 25 MCG (1000 UT) tablet Take 2,000 Units by mouth daily. Patient not taking: Reported on 07/06/2020    [provider]  glucose blood (ACCU-CHEK AVIVA PLUS) test strip Use to check blood sugar once daily. 09/11/20   Ladell Pier, MD  promethazine-dextromethorphan (  PROMETHAZINE-DM) 6.25-15 MG/5ML syrup Take 2.5 mLs by mouth 4 (four) times daily as needed for cough. 08/13/20   Hazel Sams, PA-C  SUMAtriptan (IMITREX) 50 MG tablet Take 1 tablet (50 mg total) by mouth every 2 (two) hours as needed for migraine. May repeat in 2 hours if headache persists or recurs. 04/17/20   Marcial Pacas, MD  venlafaxine XR (EFFEXOR XR) 37.5 MG 24 hr capsule Take 1 capsule (37.5 mg total) by mouth daily with breakfast. Patient not taking: Reported on 07/06/2020 04/17/20   Marcial Pacas, MD    Physical Exam: Vitals:   09/27/20 1952 09/27/20 2100 09/27/20 2141 09/27/20 2200  BP:  (!) 146/108 (!) 124/106 (!) 144/106  Pulse: 87 80 98 94  Resp: '19 15 14 10  ' Temp: 98.2 F (36.8 C)     TempSrc: Oral     SpO2: 99% 98% 98% 99%  Weight:      Height:        Constitutional: NAD, calm, comfortable, morbidly obese female laying in bed Vitals:   09/27/20 1952  09/27/20 2100 09/27/20 2141 09/27/20 2200  BP:  (!) 146/108 (!) 124/106 (!) 144/106  Pulse: 87 80 98 94  Resp: '19 15 14 10  ' Temp: 98.2 F (36.8 C)     TempSrc: Oral     SpO2: 99% 98% 98% 99%  Weight:      Height:       Eyes: PERRL, lids and conjunctivae normal ENMT: Mucous membranes are moist.  Neck: normal, supple Respiratory: clear to auscultation bilaterally, no wheezing, no crackles. Normal respiratory effort. No accessory muscle use.  Cardiovascular: Regular rate and rhythm, no murmurs / rubs / gallops. No extremity edema.  Abdomen: no tenderness, no masses palpated.  Bowel sounds positive.  Musculoskeletal: no clubbing / cyanosis. No joint deformity upper and lower extremities. Good ROM, no contractures. Normal muscle tone.  Skin: no rashes, lesions, ulcers. No induration Neurologic: CN 2-12 grossly intact. Sensation intact, . Strength 5/5 in all 4.  Psychiatric: Normal judgment and insight. Alert and oriented x 3. Normal mood.     Labs on Admission: I have personally reviewed following labs and imaging studies  CBC: Recent Labs  Lab 09/27/20 1027 09/27/20 1305 09/27/20 1310 09/27/20 1332  WBC 7.4  --   --   --   HGB 14.8 15.6* 15.6* 18.0*  HCT 48.4* 46.0 46.0 53.0*  MCV 69.3*  --   --   --   PLT 277  --   --   --    Basic Metabolic Panel: Recent Labs  Lab 09/27/20 1027 09/27/20 1305 09/27/20 1310 09/27/20 1330 09/27/20 1332 09/27/20 1845  NA 129* 127* 128* 130* 132* 136  K 4.8 >8.5* >8.5* 4.6 4.6 3.8  CL 96*  --   --  96*  --  103  CO2 17*  --   --  17*  --  23  GLUCOSE 490*  --   --  428*  --  181*  BUN 22*  --   --  21*  --  18  CREATININE 1.88*  --   --  1.76*  --  1.47*  CALCIUM 10.5*  --   --  10.3  --  9.4   GFR: Estimated Creatinine Clearance: 58 mL/min (A) (by C-G formula based on SCr of 1.47 mg/dL (H)). Liver Function Tests: Recent Labs  Lab 09/27/20 1330  AST 16  ALT 11  ALKPHOS 170*  BILITOT 0.6  PROT 9.1*  ALBUMIN 4.5   Recent  Labs  Lab 09/27/20 1330  LIPASE 96*   No results for input(s): AMMONIA in the last 168 hours. Coagulation Profile: No results for input(s): INR, PROTIME in the last 168 hours. Cardiac Enzymes: No results for input(s): CKTOTAL, CKMB, CKMBINDEX, TROPONINI in the last 168 hours. BNP (last 3 results) No results for input(s): PROBNP in the last 8760 hours. HbA1C: Recent Labs    09/27/20 1257  HGBA1C 14.9*   CBG: Recent Labs  Lab 09/27/20 1746 09/27/20 1841 09/27/20 1956 09/27/20 2106 09/27/20 2206  GLUCAP 182* 195* 173* 156* 162*   Lipid Profile: No results for input(s): CHOL, HDL, LDLCALC, TRIG, CHOLHDL, LDLDIRECT in the last 72 hours. Thyroid Function Tests: No results for input(s): TSH, T4TOTAL, FREET4, T3FREE, THYROIDAB in the last 72 hours. Anemia Panel: No results for input(s): VITAMINB12, FOLATE, FERRITIN, TIBC, IRON, RETICCTPCT in the last 72 hours. Urine analysis:    Component Value Date/Time   COLORURINE YELLOW 09/27/2020 1643   APPEARANCEUR CLEAR 09/27/2020 1643   LABSPEC 1.029 09/27/2020 1643   PHURINE 5.5 09/27/2020 1643   GLUCOSEU >1,000 (A) 09/27/2020 1643   HGBUR NEGATIVE 09/27/2020 1643   BILIRUBINUR NEGATIVE 09/27/2020 1643   KETONESUR 40 (A) 09/27/2020 1643   PROTEINUR 30 (A) 09/27/2020 1643   UROBILINOGEN 0.2 05/03/2018 1156   NITRITE NEGATIVE 09/27/2020 1643   LEUKOCYTESUR NEGATIVE 09/27/2020 1643    Radiological Exams on Admission: No results found.    Assessment/Plan  DKA with hx of uncontrolled Type 2 DM -Hemoglobin A1c of 14.9.  Takes exenatide weekly and has not missed dose. -Initially was started on insulin infusion but anion gap is now closed -Will initiate 15 units Levermir BID with resistant correction scale and d/c infusion after 2 hours -okay to transition to cardiac/carb diet   Visual disturbance  -pt reports flashing lights to left eye that she attributes to her hyperglycemia and reports it is different from her  migraines - CT head was recommended as she continues to have symptoms despite her sugar improving. She refuses imaging and wants to monitor for now  AKI on CKD 3a -Cr of 1.88 now improved with fluids to 1.47. Unclear baseline -continue to monitor and avoid nephrotoxic agent  Chronic migraine -notes 10/10 headache on arrival - give IV compazine once  -Continue Topamax  COVID viral infection -PCR positive but she reports being positive in May and currently has no symptoms. Per documentation had home test positive on 6/1 -remove isolation and contact precaution  Hx of seizure -last one in March 2020 due to hyponatremia and hyperglycemia -has been off Keppra  DVT prophylaxis:.Lovenox Code Status: Full Family Communication: Plan discussed with patient at bedside  disposition Plan: Home with observation Consults called:  Admission status: Observation  Level of care: Stepdown  Status is: Observation  The patient remains OBS appropriate and will d/c before 2 midnights.  Dispo: The patient is from: Home              Anticipated d/c is to: Home              Patient currently is not medically stable to d/c.   Difficult to place patient No         Orene Desanctis DO [facility]   If 7PM-7AM, please contact night-coverage [email or web address]   09/27/2020, 10:34 PM

## 2020-09-27 NOTE — ED Notes (Signed)
MD notified of reduced K rates to mitigate IV problems

## 2020-09-27 NOTE — ED Notes (Signed)
Pt stated that Iv was burning. K turned down to 80 and infused with LR. Pt states the IV feels much better.

## 2020-09-27 NOTE — Telephone Encounter (Signed)
Reason for Disposition  Patient sounds very sick or weak to the triager  Answer Assessment - Initial Assessment Questions 1. BLOOD GLUCOSE: "What is your blood glucose level?"      402 this AM during call 2. ONSET: "When did you check the blood glucose?"     During call this AM 3. USUAL RANGE: "What is your glucose level usually?" (e.g., usual fasting morning value, usual evening value)     June been [phone]... 4. KETONES: "Do you check for ketones (urine or blood test strips)?" If yes, ask: "What does the test show now?"      no 5. TYPE 1 or 2:  "Do you know what type of diabetes you have?"  (e.g., Type 1, Type 2, Gestational; doesn't know)       6. INSULIN: "Do you take insulin?" "What type of insulin(s) do you use? What is the mode of delivery? (syringe, pen; injection or pump)?"      Yes, Exenatide weekly, no missed doses 7. DIABETES PILLS: "Do you take any pills for your diabetes?" If yes, ask: "Have you missed taking any pills recently?"     No 8. OTHER SYMPTOMS: "Do you have any symptoms?" (e.g., fever, frequent urination, difficulty breathing, dizziness, weakness, vomiting)     Frequently, flashing left eye few days ago  Protocols used: Diabetes - High Blood Sugar-A-AH

## 2020-09-27 NOTE — ED Notes (Signed)
VBG obtained through IV by RN. Sample ran x2 with K+ >8.5. MD notified and another sample to be obtained by RN to verify results.

## 2020-09-27 NOTE — ED Triage Notes (Signed)
Pt states she has high blood sugar for about a week with flashing in corner of left eye. Went to [facility] and wellness this morning, sent her due to CBG over 400

## 2020-09-27 NOTE — ED Notes (Signed)
Pt complained of burning in arm again, K reduced to 60 to alleviate buen, LR y ported in still at 125

## 2020-09-27 NOTE — Telephone Encounter (Signed)
Noted. Pt is currently admitted.

## 2020-09-28 ENCOUNTER — Telehealth (INDEPENDENT_AMBULATORY_CARE_PROVIDER_SITE_OTHER): Payer: Self-pay

## 2020-09-28 DIAGNOSIS — Z8616 Personal history of COVID-19: Secondary | ICD-10-CM | POA: Diagnosis not present

## 2020-09-28 DIAGNOSIS — E1110 Type 2 diabetes mellitus with ketoacidosis without coma: Secondary | ICD-10-CM | POA: Diagnosis not present

## 2020-09-28 DIAGNOSIS — G40909 Epilepsy, unspecified, not intractable, without status epilepticus: Secondary | ICD-10-CM | POA: Diagnosis not present

## 2020-09-28 DIAGNOSIS — H579 Unspecified disorder of eye and adnexa: Secondary | ICD-10-CM | POA: Diagnosis not present

## 2020-09-28 LAB — HIV ANTIBODY (ROUTINE TESTING W REFLEX): HIV Screen 4th Generation wRfx: NONREACTIVE

## 2020-09-28 LAB — BASIC METABOLIC PANEL
Anion gap: 9 (ref 5–15)
BUN: 18 mg/dL (ref 6–20)
CO2: 21 mmol/L — ABNORMAL LOW (ref 22–32)
Calcium: 9.2 mg/dL (ref 8.9–10.3)
Chloride: 103 mmol/L (ref 98–111)
Creatinine, Ser: 1.58 mg/dL — ABNORMAL HIGH (ref 0.44–1.00)
GFR, Estimated: 40 mL/min — ABNORMAL LOW (ref >60)
Glucose, Bld: 277 mg/dL — ABNORMAL HIGH (ref 70–99)
Potassium: 3.4 mmol/L — ABNORMAL LOW (ref 3.5–5.1)
Sodium: 133 mmol/L — ABNORMAL LOW (ref 135–145)

## 2020-09-28 LAB — GLUCOSE, CAPILLARY
Glucose-Capillary: 262 mg/dL — ABNORMAL HIGH (ref 70–99)
Glucose-Capillary: 231 mg/dL — ABNORMAL HIGH (ref 70–99)
Glucose-Capillary: 395 mg/dL — ABNORMAL HIGH (ref 70–99)
Glucose-Capillary: 370 mg/dL — ABNORMAL HIGH (ref 70–99)
Glucose-Capillary: 264 mg/dL — ABNORMAL HIGH (ref 70–99)

## 2020-09-28 MED ORDER — INSULIN DETEMIR 100 UNIT/ML ~~LOC~~ SOLN: 15.0000 [IU] | Freq: Two times a day (BID) | SUBCUTANEOUS | 11 refills | Status: DC

## 2020-09-28 MED ORDER — POTASSIUM CHLORIDE CRYS ER 20 MEQ PO TBCR
40.0000 meq | EXTENDED_RELEASE_TABLET | Freq: Once | ORAL | Status: AC
  Administered 2020-09-28: 40 meq via ORAL
  Filled 2020-09-28: qty 2

## 2020-09-28 MED ORDER — LIVING WELL WITH DIABETES BOOK
Freq: Once | Status: AC
  Filled 2020-09-28: qty 1

## 2020-09-28 MED ORDER — INSULIN ASPART 100 UNIT/ML FLEXPEN: PEN_INJECTOR | SUBCUTANEOUS | 11 refills | Status: DC

## 2020-09-28 MED ORDER — INSULIN STARTER KIT- PEN NEEDLES (ENGLISH)
1.0000 | Freq: Once | Status: AC
  Administered 2020-09-28: 1
  Filled 2020-09-28: qty 1

## 2020-09-28 NOTE — Discharge Summary (Signed)
Physician Discharge Summary  Rebecca Johnston ZOX:[id number] DOB: 10/01/1970 DOA: 09/27/2020  PCP: Ladell Pier, MD  Admit date: 09/27/2020 Discharge date: 09/28/2020  Time spent: 60 minutes  Recommendations for Outpatient Follow-up:  Follow-up ophthalmology in 1 week Follow-up PCP in 1 week   Discharge Diagnoses:  Principal Problem:   DKA (diabetic ketoacidosis) ([place]) Active Problems:   Seizure disorder ([place])   Migraine   DKA, type 2 ([place])   Acute-on-chronic kidney injury ([place])   Visual complaint   History of COVID-19   Discharge Condition: Stable  Diet recommendation: Carb modified diet  Filed Weights   09/27/20 1021 09/27/20 1800  Weight: 125.2 kg 118.7 kg    History of present illness:  50 year old female with medical history of uncontrolled diabetes mellitus type 2, chronic migraine, OSA, history of seizure, CKD stage III, hyperlipidemia presented with hyperglycemia.  Also complained of visual disturbance with flashing in the corner of left eye.  Also found to be incidentally COVID-positive with no symptoms.  Initially tested positive with COVID-19 on 08/01/2020.  Hospital Course:   Diabetes mellitus, uncontrolled -Hemoglobin A1c elevated to 14.9 -Patient takes exenatide 2 mg subcu weekly at home -Started on Levemir 15 units subcu twice daily along with sliding scale insulin -We will discontinue exenatide and discharged on Levemir along with NovoLog sliding scale -Follow-up PCP for further management as outpatient; called and confirmed with patient's PCP Dr. Wynetta Emery  Visual flashes -Patient reported flashing in the corner of left eye -Called and discussed with ophthalmologist on-call Dr. Sammie Bench -He says there is no urgent intervention needed at this time -Able to follow patient in clinic in 1 week  CKD stage IIIa -Creatinine 1.53 today, unclear baseline  Chronic migraine -Continue as needed sumatriptan -Continue Topamax  COVID-19 viral  infection -Initially tested positive with COVID-19 on 08/01/2020 -SARS-CoV-2 PCR is positive but patient is asymptomatic -No further treatment required, no isolation needed  History of seizure -Last seizure was in March 2020 due to hyponatremia and hyperglycemia -Has been off Keppra  Procedures:   Consultations:   Discharge Exam: Vitals:   09/28/20 0956 09/28/20 1231  BP:    Pulse:    Resp:    Temp: 98.3 F (36.8 C) 97.6 F (36.4 C)  SpO2:      General: Appears in no acute distress Cardiovascular: S1-S2, regular Respiratory: Clear to auscultation bilaterally  Discharge Instructions   Discharge Instructions     Diet - low sodium heart healthy   Complete by: As directed    Discharge instructions   Complete by: As directed    Follow ophthalmologist Dr. Sammie Bench as outpatient for visual flashes   Increase activity slowly   Complete by: As directed       Allergies as of 09/28/2020       Reactions   Doxycycline    "bad dreaming, hallucination, dizziness" per pt        Medication List     STOP taking these medications    Bydureon BCise 2 MG/0.85ML Auij Generic drug: Exenatide ER       TAKE these medications    Accu-Chek Aviva Plus test strip Generic drug: glucose blood Use to check blood sugar once daily.   Accu-Chek Guide Me w/Device Kit Use to check blood sugar once daily.   Accu-Chek Softclix Lancets lancets Use to check blood sugar once daily.   atorvastatin 10 MG tablet Commonly known as: LIPITOR Take 1 tablet (10 mg total) by mouth daily.   blood  glucose meter kit and supplies Dispense based on patient and insurance preference. Use up to four times daily as directed. (FOR ICD-10 E10.9, E11.9).   cetirizine 10 MG tablet Commonly known as: ZYRTEC Take 1 tablet (10 mg total) by mouth daily.   fluticasone 50 MCG/ACT nasal spray Commonly known as: FLONASE USE 1 SPRAY(S) IN EACH NOSTRIL ONCE DAILY AS NEEDED FOR ALLERGIES OR  RHINITIS    insulin aspart 100 UNIT/ML FlexPen Commonly known as: NOVOLOG Take 3 times daily with meals ---Sliding scale insulin ---Less than 70 initiate hypoglycemia protocol 70-120  0 units 120-150 1 unit 151-200 2 units 201-250 3 units 251-300 5 units 301-350 7 units 351-400 9 units Greater than 400 call MD   insulin detemir 100 UNIT/ML injection Commonly known as: LEVEMIR Inject 0.15 mLs (15 Units total) into the skin 2 (two) times daily.   pantoprazole 40 MG tablet Commonly known as: PROTONIX Take 1 tablet (40 mg total) by mouth daily.   promethazine-dextromethorphan 6.25-15 MG/5ML syrup Commonly known as: PROMETHAZINE-DM Take 2.5 mLs by mouth 4 (four) times daily as needed for cough.   SUMAtriptan 50 MG tablet Commonly known as: Imitrex Take 1 tablet (50 mg total) by mouth every 2 (two) hours as needed for migraine. May repeat in 2 hours if headache persists or recurs.   topiramate 100 MG tablet Commonly known as: TOPAMAX Take 1 tablet (100 mg total) by mouth 2 (two) times daily.       Allergies  Allergen Reactions   Doxycycline     "bad dreaming, hallucination, dizziness" per pt    Follow-up Information     Jalene Mullet, MD. Schedule an appointment as soon as possible for a visit in 1 week(s).   Specialty: Ophthalmology Contact information: [address] [place] [address] [phone]                  The results of significant diagnostics from this hospitalization (including imaging, microbiology, ancillary and laboratory) are listed below for reference.    Significant Diagnostic Studies: No results found.  Microbiology: Recent Results (from the past 240 hour(s))  Resp Panel by RT-PCR (Flu A&B, Covid) Nasopharyngeal Swab     Status: Abnormal   Collection Time: 09/27/20 12:57 PM   Specimen: Nasopharyngeal Swab; Nasopharyngeal(NP) swabs in vial transport medium  Result Value Ref Range Status   SARS Coronavirus 2 by RT PCR POSITIVE (A)  NEGATIVE Final    Comment: RESULT CALLED TO, READ BACK BY AND VERIFIED WITH: PATTY DOWD RN [phone] [facility] 09/27/20 (NOTE) SARS-CoV-2 target nucleic acids are DETECTED.  The SARS-CoV-2 RNA is generally detectable in upper respiratory specimens during the acute phase of infection. Positive results are indicative of the presence of the identified virus, but do not rule out bacterial infection or co-infection with other pathogens not detected by the test. Clinical correlation with patient history and other diagnostic information is necessary to determine patient infection status. The expected result is Negative.  Fact Sheet for Patients: EntrepreneurPulse.com.au  Fact Sheet for Healthcare Providers: IncredibleEmployment.be  This test is not yet approved or cleared by the [place] FDA and  has been authorized for detection and/or diagnosis of SARS-CoV-2 by FDA under an Emergency Use Authorization (EUA).  This EUA will remain in effect (meaning this test can be [place] ed) for the duration of  the COVID-19 declaration under Section 564(b)(1) of the Act, 21 U.S.C. section 360bbb-3(b)(1), unless the authorization is terminated or revoked sooner.     Influenza  A by PCR NEGATIVE NEGATIVE Final   Influenza B by PCR NEGATIVE NEGATIVE Final    Comment: (NOTE) The Xpert Xpress SARS-CoV-2/FLU/RSV plus assay is intended as an aid in the diagnosis of influenza from Nasopharyngeal swab specimens and should not be used as a sole basis for treatment. Nasal washings and aspirates are unacceptable for Xpert Xpress SARS-CoV-2/FLU/RSV testing.  Fact Sheet for Patients: EntrepreneurPulse.com.au  Fact Sheet for Healthcare Providers: IncredibleEmployment.be  This test is not yet approved or cleared by the [place] FDA and has been authorized for detection and/or diagnosis of SARS-CoV-2 by FDA under an Emergency Use Authorization  (EUA). This EUA will remain in effect (meaning this test can be used) for the duration of the COVID-19 declaration under Section 564(b)(1) of the Act, 21 U.S.C. section 360bbb-3(b)(1), unless the authorization is terminated or revoked.  Performed at [facility], [address] [place], [place] [address]   MRSA Next Gen by PCR, Nasal     Status: None   Collection Time: 09/27/20  5:02 PM   Specimen: Nasal Mucosa; Nasal Swab  Result Value Ref Range Status   MRSA by PCR Next Gen NOT DETECTED NOT DETECTED Final    Comment: (NOTE) The GeneXpert MRSA Assay (FDA approved for NASAL specimens only), is one component of a comprehensive MRSA colonization surveillance program. It is not intended to diagnose MRSA infection nor to guide or monitor treatment for MRSA infections. Test performance is not FDA approved in patients less than 43 years old. Performed at [facility], [place] [address] [place], [place] [address]      Labs: Basic Metabolic Panel: Recent Labs  Lab 09/27/20 1027 09/27/20 1305 09/27/20 1310 09/27/20 1330 09/27/20 1332 09/27/20 1845 09/28/20 0300  NA 129*   < > 128* 130* 132* 136 133*  K 4.8   < > >8.5* 4.6 4.6 3.8 3.4*  CL 96*  --   --  96*  --  103 103  CO2 17*  --   --  17*  --  23 21*  GLUCOSE 490*  --   --  428*  --  181* 277*  BUN 22*  --   --  21*  --  18 18  CREATININE 1.88*  --   --  1.76*  --  1.47* 1.58*  CALCIUM 10.5*  --   --  10.3  --  9.4 9.2   < > = values in this interval not displayed.   Liver Function Tests: Recent Labs  Lab 09/27/20 1330  AST 16  ALT 11  ALKPHOS 170*  BILITOT 0.6  PROT 9.1*  ALBUMIN 4.5   Recent Labs  Lab 09/27/20 1330  LIPASE 96*   No results for input(s): AMMONIA in the last 168 hours. CBC: Recent Labs  Lab 09/27/20 1027 09/27/20 1305 09/27/20 1310 09/27/20 1332  WBC 7.4  --   --   --   HGB 14.8 15.6* 15.6* 18.0*  HCT 48.4* 46.0 46.0 53.0*  MCV 69.3*  --   --    --   PLT 277  --   --   --      CBG: Recent Labs  Lab 09/27/20 2206 09/28/20 0153 09/28/20 0747 09/28/20 1137 09/28/20 1335  GLUCAP 162* 262* 231* 395* 370*       Signed:  Oswald Hillock MD.  [facility] 09/28/2020, 3:57 PM

## 2020-09-28 NOTE — Progress Notes (Signed)
I received a consult that patient wanted to fill out an advance directive. Patient is COVID positive and will be discharged today. I spoke to her by phone.  She does not wish to fill one out today.  I encouraged her to speak about it with her PCP.  [place], [place] Pager, [phone] 11:34 AM

## 2020-09-28 NOTE — Progress Notes (Signed)
Inpatient Diabetes Program Recommendations  AACE/ADA: New Consensus Statement on Inpatient Glycemic Control (2015)  Target Ranges:  Prepandial:   less than 140 mg/dL      Peak postprandial:   less than 180 mg/dL (1-2 hours)      Critically ill patients:  140 - 180 mg/dL   Lab Results  Component Value Date   GLUCAP 370 (H) 09/28/2020   HGBA1C 14.9 (H) 09/27/2020    Review of Glycemic Control  Diabetes history: DM2 Outpatient Diabetes medications: Exenitide 2 mg weekly on Thursdays Current orders for Inpatient glycemic control: Levemir 15 BID, Novolog 0-20 units TID with meals and 0-5 HS  HgbA1C - 14.9%  Inpatient Diabetes Program Recommendations:    For discharge:  Levemir 15 units BID Novolog 0-20 units TID with meals and 0-5 HS  Spoke with patient about her diabetes.  Discussed A1C results (14.9%) and explained what an A1C is and informed patient that his current A1C indicates an average glucose of > 400 mg/dl over the past 2-3 months. Discussed basic pathophysiology of DM Type 2, basic home care, importance of checking CBGs and maintaining good CBG control to prevent long-term and short-term complications. Reviewed glucose and A1C goals and explained that patient will need to continue to monitor blood sugars at least 4x/day and take logbook to PCP for review.   Reviewed signs and symptoms of hyperglycemia and hypoglycemia along with treatment for both. Discussed impact of nutrition, exercise, stress, sickness, and medications on diabetes control. Ordered Living Well with diabetes booklet and encouraged patient to read through entire book.  Discussed long-acting and rapid acting insulin. Pt seems motivated to get her blood sugars back in control. Said she's had a lot of stress and needs to exercise more. Will follow-up with PCP. Answered questions. RN taught insulin pen administration. Pt was able to return demonstration.   To be d/ced this afternoon.  Thank you. Lorenda Peck,  RD, LDN, CDE Inpatient Diabetes Coordinator [phone]

## 2020-09-28 NOTE — Telephone Encounter (Signed)
Sent to pcps assistant. Nat Christen, CMA     Copied from [place] [phone]. Topic: General - Other >> Sep 28, 2020 10:17 AM Valere Dross wrote: Reason for CRM: Dr. Darrick Meigs called in from [place] wanting to speak with PCP about pt medication, pt is about to be discharged and Dr needs to speak with her. Please advise.

## 2020-09-28 NOTE — Telephone Encounter (Signed)
Phone call returned to Dr. Darrick Meigs.  He is the hospitalist who is following Rebecca Johnston.  She was admitted with DKA and COVID.  Her A1c is 14.  She was on exenatide once a week.  He wanted to let me know that he will be stopping the exenatide and sending her out on Levemir 15 units twice a day with sliding scale NovoLog.  I told him that would be okay with me.  Message sent to our schedulers to get her scheduled for hospital follow-up visit.

## 2020-09-28 NOTE — Telephone Encounter (Signed)
Will forward to provider  

## 2020-09-28 NOTE — TOC Initial Note (Signed)
Transition of Care [facility]) - Initial/Assessment Note    Patient Details  Name: Rebecca Johnston MRN: [id number] Date of Birth: 1971/01/24  [facility] CM/SW Contact:    Leeroy Cha, RN Phone Number: 09/28/2020, 7:34 AM  Clinical Narrative:                 50 y.o. female with medical history significant for uncontrolled type 2 diabetes, chronic migraine, OSA, history of seizure, CKD stage III and hyperlipidemia who presents from outside ED for concerns of hyperglycemia.   Patient reports that her sugar has been running high usually around 300s.  Also for the past few days has noticed flashing to the corner of her left eye which she thinks may be due to her hyperglycemia.  She has history of chronic migraine but denies any visual disturbances with them.  She is on exenatide once weekly with last dose on Tuesday which was 2 days ago.  She denies any nausea, vomiting or diarrhea.  No abdominal pain.  No blurred or double vision.  No eye pain.  No focal extremity weakness.   ED Course: She was afebrile normotensive on room air.  Blood glucose initially of 490 with sodium of 129.  Anion gap 16.  pH of 7.317.  Creatinine of 1.88 with baseline appearing around 1.5-1.8. She was initiated on insulin infusion and was transferred here to [facility] for hospitalist admission.  TOC PLAN OF CARE: will need to be seen by the diabetic coordinator , return home with adls as before this hospitalization.  Expected Discharge Plan: Home/Self Care Barriers to Discharge: Continued Medical Work up   Patient Goals and CMS Choice Patient states their goals for this hospitalization and ongoing recovery are:: to go home CMS Medicare.gov Compare Post Acute Care list provided to:: Patient    Expected Discharge Plan and Services Expected Discharge Plan: Home/Self Care   Discharge Planning Services: CM Consult   Living arrangements for the past 2 months: Apartment                                       Prior Living Arrangements/Services Living arrangements for the past 2 months: Apartment Lives with:: Self Patient language and need for interpreter reviewed:: Yes Do you feel safe going back to the place where you live?: Yes            Criminal Activity/Legal Involvement Pertinent to Current Situation/Hospitalization: No - Comment as needed  Activities of Daily Living Home Assistive Devices/Equipment: CBG Meter, Contact lenses ADL Screening (condition at time of admission) Patient's cognitive ability adequate to safely complete daily activities?: Yes Is the patient deaf or have difficulty hearing?: No Does the patient have difficulty seeing, even when wearing glasses/contacts?: No Does the patient have difficulty concentrating, remembering, or making decisions?: No Patient able to express need for assistance with ADLs?: Yes Does the patient have difficulty dressing or bathing?: No Independently performs ADLs?: Yes (appropriate for developmental age) Does the patient have difficulty walking or climbing stairs?: No Weakness of Legs: None Weakness of Arms/Hands: None  Permission Sought/Granted                  Emotional Assessment Appearance:: Appears stated age     Orientation: : Oriented to Self, Oriented to Place, Oriented to  Time, Oriented to Situation Alcohol / Substance Use: Not Applicable Psych Involvement: No (comment)  Admission  diagnosis:  DKA (diabetic ketoacidosis) ([place]) [E11.10] DKA, type 2 ([place]) [E11.10] Diabetic ketoacidosis without coma associated with type 2 diabetes mellitus ([place]) [E11.10] Patient Active Problem List   Diagnosis Date Noted   DKA (diabetic ketoacidosis) ([place]) 09/27/2020   DKA, type 2 ([place]) 09/27/2020   Acute-on-chronic kidney injury ([place]) 09/27/2020   Visual complaint 09/27/2020   History of COVID-19 09/27/2020   Sleep apnea 04/17/2020   Seizure ([place]) 04/17/2020   Chronic migraine w/o aura w/o status migrainosus,  not intractable 04/17/2020   Migraine 10/11/2019   Hyperlipidemia associated with type 2 diabetes mellitus ([place]) 05/11/2019   Elevated blood pressure reading 05/09/2019   Anemia due to stage 3 chronic kidney disease ([place]) 07/27/2018   Anemia of chronic disease 06/17/2018   Vitamin D deficiency 06/15/2018   Class 3 severe obesity due to excess calories with serious comorbidity and body mass index (BMI) of 40.0 to 44.9 in adult ([place]) 06/15/2018   Seizure disorder ([place]) 05/14/2018   GERD (gastroesophageal reflux disease) 05/07/2018   CKD (chronic kidney disease), stage III ([place]) 04/16/2017   Neuropathy 04/04/2016   Seasonal allergic rhinitis 12/21/2015   Type II diabetes mellitus with renal manifestations ([place]) 12/21/2015   Incarcerated hernia s/p lap repair w mesh 12/08/2014 12/08/2014   Tobacco dependence 10/21/2013   Abnormal CT scan, chest 09/02/2013   PCP:  Ladell Pier, MD Pharmacy:   [facility], [place] [address] Phone: [phone] Fax: [phone]     Social Determinants of Health (SDOH) Interventions    Readmission Risk Interventions No flowsheet data found.

## 2020-10-01 ENCOUNTER — Encounter: Payer: Self-pay | Admitting: Gastroenterology

## 2020-10-01 ENCOUNTER — Telehealth: Payer: Self-pay

## 2020-10-01 DIAGNOSIS — H539 Unspecified visual disturbance: Secondary | ICD-10-CM

## 2020-10-01 DIAGNOSIS — N1832 Type 2 diabetes mellitus with diabetic chronic kidney disease: Secondary | ICD-10-CM

## 2020-10-01 DIAGNOSIS — E1122 Type 2 diabetes mellitus with diabetic chronic kidney disease: Secondary | ICD-10-CM

## 2020-10-01 LAB — I-STAT VENOUS BLOOD GAS, ED
Acid-base deficit: 4 mmol/L — ABNORMAL HIGH (ref 0.0–2.0)
Acid-base deficit: 4 mmol/L — ABNORMAL HIGH (ref 0.0–2.0)
Bicarbonate: 22.7 mmol/L (ref 20.0–28.0)
Bicarbonate: 22.7 mmol/L (ref 20.0–28.0)
Calcium, Ion: 1.14 mmol/L — ABNORMAL LOW (ref 1.15–1.40)
Calcium, Ion: 1.15 mmol/L (ref 1.15–1.40)
HCT: 46 % (ref 36.0–46.0)
HCT: 46 % (ref 36.0–46.0)
Hemoglobin: 15.6 g/dL — ABNORMAL HIGH (ref 12.0–15.0)
Hemoglobin: 15.6 g/dL — ABNORMAL HIGH (ref 12.0–15.0)
O2 Saturation: 31 %
O2 Saturation: 32 %
Patient temperature: 97.8
Patient temperature: 98.7
Potassium: >8.5 mmol/L (ref 3.5–5.1)
Potassium: >8.5 mmol/L (ref 3.5–5.1)
Sodium: 127 mmol/L — ABNORMAL LOW (ref 135–145)
Sodium: 128 mmol/L — ABNORMAL LOW (ref 135–145)
TCO2: 24 mmol/L (ref 22–32)
TCO2: 24 mmol/L (ref 22–32)
pCO2, Ven: 44.2 mmHg (ref 44.0–60.0)
pCO2, Ven: 45.7 mmHg (ref 44.0–60.0)
pH, Ven: 7.305 (ref 7.250–7.430)
pH, Ven: 7.317 (ref 7.250–7.430)
pO2, Ven: 21 mmHg — CL (ref 32.0–45.0)
pO2, Ven: 22 mmHg — CL (ref 32.0–45.0)

## 2020-10-01 NOTE — Telephone Encounter (Signed)
Transition Care Management Follow-up Telephone Call Date of discharge and from where: 09/28/2020, [facility] How have you been since you were released from the hospital? She said she is feeling okay but she continues to have the visual flashes. Any questions or concerns? Yes She is quite concerned about the visual flashing and would like to know if her blood sugars normalize, if the flashes will go away. She said Dr Wynetta Emery was going to refer her for a colonoscopy but she has not heard anything. Provided her with the phone number for [facility] and she said she would call to check on the referral.  She is new to insulin but stated that she has not been having any problems administering the insulin. She correctly explained how she administered the novolog per sliding scale.  She has a glucometer with memory and has been checking her blood sugars as ordered. Since coming home they have ranged from 298 ( this morning ) to 345.  She had a question about her A1c  - it was 14.9 when she was in the hospital but was 7.9 05/14/2020 per nephrology.  She is very concerned about the big change in the A1c.    Items Reviewed: Did the pt receive and understand the discharge instructions provided? Yes  Medications obtained and verified? Yes  - She has all medications and did not have any questions about her med regime  Other? No  Any new allergies since your discharge? No Do you have support at home?  She lives with her teenage daughter  Home Care and Equipment/Supplies: Were home health services ordered? no If so, what is the name of the agency? N/a  Has the agency set up a time to come to the patient's home? not applicable Were any new equipment or medical supplies ordered?  No What is the name of the medical supply agency? N/a Were you able to get the supplies/equipment? not applicable Do you have any questions related to the use of the equipment or supplies? No  Functional Questionnaire: (I =  Independent and D = Dependent) ADLs: independent    Follow up appointments reviewed:  PCP Hospital f/u appt confirmed? Yes  Scheduled to see Dr Wynetta Emery  on 10/09/2020 @ 1050. [facility] f/u appt confirmed? Yes  Scheduled to see neurology - 10/16/2020.  She also needs to follow up with ophthalmologist.  She explained that she saw her private eye doctor 09/29/2020 and that doctor was going to refer her to a specialist.  She said she has not heard from the specialist.   Instructed her to call her eye doctor back and also reminded her that she can contact Dr Posey Pronto as noted on her AVS and she said she would follow up .  Are transportation arrangements needed?  She has been driving.  Provided her with the phone number for [employer] transportation and encouraged her to call to schedule rides to her medical appointments  If their condition worsens, is the pt aware to call PCP or go to the Emergency Dept.? Yes Was the patient provided with contact information for the PCP's office or ED? Yes Was to pt encouraged to call back with questions or concerns? Yes

## 2020-10-09 ENCOUNTER — Other Ambulatory Visit: Payer: Self-pay

## 2020-10-09 ENCOUNTER — Ambulatory Visit: Payer: Medicaid Other | Attending: Internal Medicine | Admitting: Internal Medicine

## 2020-10-09 DIAGNOSIS — E1122 Type 2 diabetes mellitus with diabetic chronic kidney disease: Secondary | ICD-10-CM | POA: Diagnosis not present

## 2020-10-09 DIAGNOSIS — N183 Chronic kidney disease, stage 3 unspecified: Secondary | ICD-10-CM

## 2020-10-09 DIAGNOSIS — Z794 Long term (current) use of insulin: Secondary | ICD-10-CM

## 2020-10-09 DIAGNOSIS — Z09 Encounter for follow-up examination after completed treatment for conditions other than malignant neoplasm: Secondary | ICD-10-CM | POA: Diagnosis not present

## 2020-10-09 MED ORDER — INSULIN DETEMIR 100 UNIT/ML ~~LOC~~ SOLN: 17.0000 [IU] | Freq: Two times a day (BID) | SUBCUTANEOUS | 11 refills | Status: DC

## 2020-10-09 NOTE — Progress Notes (Signed)
Patient ID: Rebecca Johnston, female   DOB: March 26, 1970, 50 y.o.   MRN: [id number] Virtual Visit via Telephone Note  I connected with Adonis Huguenin Wertenberger on 10/09/2020 at 11:50 AM by telephone and verified that I am speaking with the correct person using two identifiers  Location: Patient: pulled over in her car Provider: office  Participants: Myself Patient   I discussed the limitations, risks, security and privacy concerns of performing an evaluation and management service by telephone and the availability of in person appointments. I also discussed with the patient that there may be a patient responsible charge related to this service. The patient expressed understanding and agreed to proceed.   History of Present Illness: Pt with hx of obesity, DM with neuropathy, obesity, CKD 3, tob dep, partial SZ, ACD, Vit D def, migraine w/o aura.  Purpose of today's visit is hospital follow-up/transition of care.  Date of hospitalization: 7/28-29/2022 Date of phone call from case worker: 10/09/2020  Patient hospitalized with DKA, asymptomatic COVID-19 infection and complaint of flashing light in the left visual field.  A1c was found to be 14.9.  She was on exenatide.  After treatment for DKA, exenatide was discontinued.  She was started on Levemir 15 unit twice a day along with sliding scale NovoLog.  Creatinine by the time of discharge was 1.58 with GFR of 40.  Previously GFR in the 30s.  Today: DM: Checking blood sugars every 2 hours.  Morning range has been 181-298.  She read of her blood sugar readings for yesterday starting at yesterday morning: 190, 234, 189, 229, 173.  Reports compliance with taking Levemir 15 units twice a day.  On average she is taking 2 to 9 units of the NovoLog with meals.  She feels she is doing better with her eating habits since hospital discharge.  She is not exercising as much as she should.  She reports that the flashing light at the corner of the left eye has ceased.   It may have been due to the elevated blood sugars.  However she has an appointment with [facility] tomorrow which she intends to keep.  She tells me that she has been scheduled for her colonoscopy for the ninth of next month.  I had referred her on last visit.   Outpatient Encounter Medications as of 10/09/2020  Medication Sig Note   Accu-Chek Softclix Lancets lancets Use to check blood sugar once daily.    atorvastatin (LIPITOR) 10 MG tablet Take 1 tablet (10 mg total) by mouth daily. 09/27/2020: Haven't started as of 07/28   blood glucose meter kit and supplies Dispense based on patient and insurance preference. Use up to four times daily as directed. (FOR ICD-10 E10.9, E11.9).    Blood Glucose Monitoring Suppl (ACCU-CHEK GUIDE ME) w/Device KIT Use to check blood sugar once daily.    cetirizine (ZYRTEC) 10 MG tablet Take 1 tablet (10 mg total) by mouth daily.    fluticasone (FLONASE) 50 MCG/ACT nasal spray USE 1 SPRAY(S) IN EACH NOSTRIL ONCE DAILY AS NEEDED FOR ALLERGIES OR  RHINITIS    glucose blood (ACCU-CHEK AVIVA PLUS) test strip Use to check blood sugar once daily.    insulin aspart (NOVOLOG) 100 UNIT/ML FlexPen Take 3 times daily with meals ---Sliding scale insulin ---Less than 70 initiate hypoglycemia protocol 70-120  0 units 120-150 1 unit 151-200 2 units 201-250 3 units 251-300 5 units 301-350 7 units 351-400 9 units Greater than 400 call MD    insulin  detemir (LEVEMIR) 100 UNIT/ML injection Inject 0.15 mLs (15 Units total) into the skin 2 (two) times daily.    pantoprazole (PROTONIX) 40 MG tablet Take 1 tablet (40 mg total) by mouth daily.    promethazine-dextromethorphan (PROMETHAZINE-DM) 6.25-15 MG/5ML syrup Take 2.5 mLs by mouth 4 (four) times daily as needed for cough.    SUMAtriptan (IMITREX) 50 MG tablet Take 1 tablet (50 mg total) by mouth every 2 (two) hours as needed for migraine. May repeat in 2 hours if headache persists or recurs.    topiramate (TOPAMAX) 100 MG  tablet Take 1 tablet (100 mg total) by mouth 2 (two) times daily.    No facility-administered encounter medications on file as of 10/09/2020.      Observations/Objective: Lab Results  Component Value Date   HGBA1C 14.9 (H) 09/27/2020     Chemistry      Component Value Date/Time   NA 133 (L) 09/28/2020 0300   NA 140 05/09/2019 1153   K 3.4 (L) 09/28/2020 0300   CL 103 09/28/2020 0300   CO2 21 (L) 09/28/2020 0300   BUN 18 09/28/2020 0300   BUN 21 05/09/2019 1153   CREATININE 1.58 (H) 09/28/2020 0300   CREATININE 1.65 (H) 12/21/2015 1019      Component Value Date/Time   CALCIUM 9.2 09/28/2020 0300   ALKPHOS 170 (H) 09/27/2020 1330   AST 16 09/27/2020 1330   ALT 11 09/27/2020 1330   BILITOT 0.6 09/27/2020 1330   BILITOT <0.2 05/09/2019 1153     Lab Results  Component Value Date   WBC 7.4 09/27/2020   HGB 18.0 (H) 09/27/2020   HCT 53.0 (H) 09/27/2020   MCV 69.3 (L) 09/27/2020   PLT 277 09/27/2020     Assessment and Plan: 1. Hospital discharge follow-up  2. Type 2 diabetes mellitus with stage 3 chronic kidney disease, with long-term current use of insulin, unspecified whether stage 3a or 3b CKD ([place]) -Patient reports that her blood sugars are getting better since hospitalization.  However morning blood sugars are still not at goal.  I went over blood sugar goals with her.  Blood sugar goals before meals 90-130.  Blood sugar goals 2 hours after meal is less than 180.  Advised that she does not have to check blood sugars as often as she is doing.  Recommend that she checks blood sugars before meals and keep a log of the numbers.  We will bring her in to see our clinical pharmacist in 2 weeks. -Increase Levemir to 17 minutes twice a day. Patient is agreeable to referral to the endocrinologist and nutritionist. Discussed the importance of regular exercise to control diabetes.  Recommend that she tries to get at least 150 minutes total per week of moderate intensity exercise. -  Ambulatory referral to Endocrinology - insulin detemir (LEVEMIR) 100 UNIT/ML injection; Inject 0.17 mLs (17 Units total) into the skin 2 (two) times daily.  Dispense: 10 mL; Refill: 11 - Amb ref to Medical Nutrition Therapy-MNT   Follow Up Instructions: 3 mths   I discussed the assessment and treatment plan with the patient. The patient was provided an opportunity to ask questions and all were answered. The patient agreed with the plan and demonstrated an understanding of the instructions.   The patient was advised to call back or seek an in-person evaluation if the symptoms worsen or if the condition fails to improve as anticipated.  I  Spent 14 minutes on this telephone encounter  Karle Plumber, MD

## 2020-10-16 ENCOUNTER — Encounter: Payer: Self-pay | Admitting: Neurology

## 2020-10-16 ENCOUNTER — Ambulatory Visit: Payer: Medicaid Other | Admitting: Neurology

## 2020-10-16 ENCOUNTER — Other Ambulatory Visit: Payer: Self-pay

## 2020-10-16 VITALS — BP 131/85 | HR 103 | Ht 64.0 in | Wt 266.5 lb

## 2020-10-16 DIAGNOSIS — G43709 Chronic migraine without aura, not intractable, without status migrainosus: Secondary | ICD-10-CM | POA: Diagnosis not present

## 2020-10-16 DIAGNOSIS — R569 Unspecified convulsions: Secondary | ICD-10-CM | POA: Diagnosis not present

## 2020-10-16 DIAGNOSIS — E1165 Type 2 diabetes mellitus with hyperglycemia: Secondary | ICD-10-CM | POA: Diagnosis not present

## 2020-10-16 DIAGNOSIS — E1139 Type 2 diabetes mellitus with other diabetic ophthalmic complication: Secondary | ICD-10-CM

## 2020-10-16 MED ORDER — SUMATRIPTAN SUCCINATE 50 MG PO TABS: ORAL_TABLET | ORAL | 11 refills | Status: AC

## 2020-10-16 MED ORDER — TOPIRAMATE 100 MG PO TABS: 100.0000 mg | ORAL_TABLET | Freq: Two times a day (BID) | ORAL | 4 refills | Status: DC

## 2020-10-16 NOTE — Progress Notes (Signed)
ASSESSMENT AND PLAN  Rebecca Johnston is a 50 y.o. female   Seizure in March 2020  In the setting of hyponatremia, sodium 127, poorly controlled diabetes significant hyperglycemia 355, A1c was 14.9  EEG was normal in June 2020  Had no recurrent seizure tapering off Keppra  MRI of brain without contrast was normal in March 2020  Chronic migraine headaches  Continue Topamax 100 mg twice daily as migraine prevention  Imitrex 50 mg as needed   Poorly controlled diabetes, evidence of diabetic peripheral neuropathy    DIAGNOSTIC DATA (LABS, IMAGING, TESTING) - I reviewed patient records, labs, notes, testing and imaging myself where available. A1c was 14.9 on September 27, 2020  HISTORICAL  Rebecca Johnston is a 50 year old female, seen in request by her primary care physician Dr. Wynetta Emery, [place], for seizure, I saw her since May 2018.   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   She presented to [facility] on [facility] 06, 2018 for headaches, blurred vision, dizziness, she described headache at the right temporal region, was noted to have some slurred speech, facial droop, with occasionally nausea, vomiting, she was treated for community-acquired pneumonia on May 28, 2018 because persistent nasal congestion, was treated with doxycycline, I personally reviewed CT and MRI of the brain showed no significant abnormality   CT angiogram of head neck was negative   UDS was negative, glucose on admission was elevated 355, hyponatremia of 127, bicarb was low at 18, she was given Reglan and Compazine for nausea, later she was noted to have eye deviation, suspected for seizure, she was loaded with Dilantin then Keppra, and the Lyrica, EEG showed 2 electrographic seizure activity emanating out of the right temporal  region, spreading mostly to the entire right side, was seen by neuro hospitalist, contributed  her partial seizure to hyonatremia, and hyperglycemia.  She was intubated on March 8 to May 11, 2018, had bouts of hypotension require epinephrine, also required nutritional support   She was discharged to rehabilitation on March 13-24, Keppra dose was adjusted to 750 mg twice a day, poorly controlled diabetes, A1c was 14.9,   Laboratory evaluations in lab in April showed normal CBC,BMP, creat 1.76.,  Total iron-binding capacity was 239,  She remained on Keppra 750 mg twice a day,  Personally reviewed MRI of the brain without contrast in March 2020, no acute intracranial abnormality  EEG June 2020 was normal.  Last was a virtual visit in February 2021, doing great, A1c was 8.1 in August 2020, Keppra level was 10.8 in September 2020  She no longer has recurrent seizure, previous seizure in March 2020 was considered due to hyponatremia of 127 in the setting of significantly elevated glucose hyperglycemia 355, she is taking Topamax 50 mg twice a day for headache,she has headache about twice a week, usually starting at the occipital region, occasionally bilateral frontal region, with light noise sensitivity, lasting for few hours, she wants to stop polypharmacy treatment.  I personally reviewed MRI of the brain without contrast March 2020, there was no acute abnormality, it was ordered because her frequent persistent right-sided headaches CT angiogram head and neck no significant abnormality  Most recent laboratory evaluation March 2021, LDL 119, CMP showed creatinine 1.96, GFR of 29, CBC showed hemoglobin of 12.9, decreased MCV of 69, elevated RDW of  15.5, she is under care of nephrologist  Laboratory evaluation in March 2021, A1c 5.7, glucose 160, hemoglobin 12.9, creatinine 1.96, GFR of 29, LDL 119   UPDATE Apr 17 2020: She continue complains of daily frontal pressure headache, 8 out of 10, Imitrex  50 mg as needed was helpful  She did complains of frequent morning headaches, frequent awakening during sleep, snoring, sleepy and tired during the day, previous attempted sleep night study was unsuccessful, does carry the diagnosis of obstructive sleep apnea by outside sleep study Headache usually last about 30 minutes, she has been taking frequent daily Tylenol the past few years  UPDATE August 16th 2022: She had no recurrent seizure, has off Keppra, still taking Topamax 100 mg twice a day as migraine prevention, average 1 migraine each week, holoacranial pressure headache, moderate, Imitrex 50 mg as needed was helpful  Reviewed hospital admission end of July 2022, uncontrolled diabetes, A1c of 14.9, chronic kidney disease, chronic migraine headaches, positive COVID,  Patient reported that she is tired of medications, has stopped her insulin for a while, now complains of few months history of bilateral feet numbness tingling, mainly staying at the bottom of her feet  REVIEW OF SYSTEMS: Full 14 system review of systems performed and notable only for as above All other review of systems were negative.  PHYSICAL EXAM   Vitals:   10/16/20 1438  BP: 131/85  Pulse: (!) 103  Weight: 266 lb 8 oz (120.9 kg)  Height: '5\' 4"'  (1.626 m)   Not recorded     Body mass index is 45.74 kg/m.  PHYSICAL EXAMNIATION:  Gen: NAD, conversant, well nourised, well groomed    NEUROLOGICAL EXAM:  MENTAL STATUS: Speech/cognition: Awake, alert, oriented to history taking care of conversation   CRANIAL NERVES: CN II: Visual fields are full to confrontation. Pupils are round equal and briskly reactive to light. CN III, IV, VI: extraocular movement are normal. No ptosis. CN V: Facial sensation is intact to light touch CN VII: Face is symmetric with normal eye closure  CN VIII: Hearing is normal to causal conversation. CN IX, X: Phonation is normal.  Narrow oropharyngeal CN XI: Head turning and  shoulder shrug are intact  MOTOR: Muscle bulk and tone are normal. Muscle strength is normal.  REFLEXES: Reflexes are 1 and symmetric at the biceps, triceps, knees, and absent at ankles. Plantar responses are flexor.  SENSORY: Mildly length dependent decreased to light touch, pinprick and vibratory sensation at toes.  COORDINATION: There is no trunk or limb dysmetria noted.  GAIT/STANCE: She can get up from seated position arm crossed, steady,  ALLERGIES: Allergies  Allergen Reactions   Doxycycline     "bad dreaming, hallucination, dizziness" per pt    HOME MEDICATIONS: Current Outpatient Medications  Medication Sig Dispense Refill   Accu-Chek Softclix Lancets lancets Use to check blood sugar once daily. 100 each 2   atorvastatin (LIPITOR) 10 MG tablet Take 1 tablet (10 mg total) by mouth daily. 30 tablet 5   blood glucose meter kit and supplies Dispense based on patient and insurance preference. Use up to four times daily as directed. (FOR ICD-10 E10.9, E11.9). 1 each 0   Blood Glucose Monitoring Suppl (ACCU-CHEK GUIDE ME) w/Device KIT Use to check blood sugar once daily. 1 kit 0   cetirizine (ZYRTEC) 10 MG tablet Take 1 tablet (10 mg total) by mouth daily. 30 tablet 2   fluticasone (FLONASE) 50 MCG/ACT nasal spray USE 1 SPRAY(S) IN EACH NOSTRIL ONCE DAILY  AS NEEDED FOR ALLERGIES OR  RHINITIS 16 g 2   glucose blood (ACCU-CHEK AVIVA PLUS) test strip Use to check blood sugar once daily. 100 each 2   insulin aspart (NOVOLOG) 100 UNIT/ML FlexPen Take 3 times daily with meals ---Sliding scale insulin ---Less than 70 initiate hypoglycemia protocol 70-120  0 units 120-150 1 unit 151-200 2 units 201-250 3 units 251-300 5 units 301-350 7 units 351-400 9 units Greater than 400 call MD 15 mL 11   insulin detemir (LEVEMIR) 100 UNIT/ML injection Inject 0.17 mLs (17 Units total) into the skin 2 (two) times daily. 10 mL 11   pantoprazole (PROTONIX) 40 MG tablet Take 1 tablet (40 mg total) by  mouth daily. 90 tablet 3   promethazine-dextromethorphan (PROMETHAZINE-DM) 6.25-15 MG/5ML syrup Take 2.5 mLs by mouth 4 (four) times daily as needed for cough. 118 mL 0   SUMAtriptan (IMITREX) 50 MG tablet Take 1 tablet (50 mg total) by mouth every 2 (two) hours as needed for migraine. May repeat in 2 hours if headache persists or recurs. 12 tablet 11   topiramate (TOPAMAX) 100 MG tablet Take 1 tablet (100 mg total) by mouth 2 (two) times daily. 180 tablet 4   No current facility-administered medications for this visit.    PAST MEDICAL HISTORY: Past Medical History:  Diagnosis Date   Abnormal CT scan, chest 09/02/2013   CTa 04/18/13  1. Study limited by body habitus with no pulmonary embolism  detected.  2. Multiple borderline and a few mildly enlarged nonspecific  mediastinal and hilar lymph nodes. Multiple pulmonary nodules  bilaterally the largest measuring 8 mm. If the patient is at high  risk for bronchogenic carcinoma, follow-up chest CT at 3-37month is  recommended.  - 12/15/13  1. Interval resolution of th   Anemia    Anemia due to stage 3 chronic kidney disease ([place] 07/27/2018   Anemia of chronic disease 06/17/2018   06/2018   Asthma    CKD (chronic kidney disease), stage III ([place] 04/16/2017   Class 3 severe obesity due to excess calories with serious comorbidity and body mass index (BMI) of 40.0 to 44.9 in adult ([facility] 06/15/2018   Diabetes mellitus without complication ([place] 129/5284  Elevated blood pressure reading 05/09/2019   Fibroids    GERD (gastroesophageal reflux disease)    History of blood transfusion Feb 2015   Hyperlipidemia associated with type 2 diabetes mellitus ([place] 05/11/2019   Incarcerated hernia s/p lap repair w mesh 12/08/2014 12/08/2014   Neuropathy 04/04/2016   Obesity, Class III, BMI 40-49.9 (morbid obesity) ([place]    Seasonal allergic rhinitis 12/21/2015   Seizure disorder ([place] [place] 05/14/2018   Shortness of breath dyspnea    Tobacco dependence 10/21/2013   Type II  diabetes mellitus with renal manifestations ([place] 12/21/2015   Vitamin D deficiency 06/15/2018    PAST SURGICAL HISTORY: Past Surgical History:  Procedure Laterality Date   ABDOMINAL HYSTERECTOMY N/A 12/08/2014   Procedure: HYSTERECTOMY ABDOMINAL;  Surgeon: JWoodroe Mode MD;  Location: [facility];  Service: Gynecology;  Laterality: N/A;   DILATION AND CURETTAGE OF UTERUS     DILITATION & CURRETTAGE/HYSTROSCOPY WITH VERSAPOINT RESECTION N/A 01/06/2014   Procedure: HCorena PilgrimWITH VERSAPOINT;  Surgeon: JWoodroe Mode MD;  [facility];  Service: Gynecology;  Laterality: N/A;   LAPAROSCOPIC LYSIS OF ADHESIONS N/A 12/08/2014   Procedure: LAPAROSCOPIC LYSIS OF ADHESIONS;  Surgeon: SMichael Boston MD;  Location: [facility];  Service: General;  Laterality: N/A;   OVARIAN CYST  REMOVAL     cyst not removed. Cyst drained   SUPRA-UMBILICAL HERNIA N/A 81/09/7114   Procedure: LAPARSCOPIC SUPRA-UMBILICAL HERNIA REPAIR ;  Surgeon: Michael Boston, MD;  Location: [facility];  Service: General;  Laterality: N/A;   VENTRAL HERNIA REPAIR N/A 12/08/2014   Procedure: LAPAROSCOPIC INCARCERATED INCISIONAL VENTRAL WALL HERNIA REPAIR;  Surgeon: Michael Boston, MD;  Location: [facility];  Service: General;  Laterality: N/A;    FAMILY HISTORY: Family History  Problem Relation Age of Onset   Hypertension Mother    Diabetes Father    Breast cancer Maternal Aunt    Diabetes Maternal Aunt    Diabetes Paternal Aunt    Diabetes Maternal Aunt    Diabetes Maternal Aunt     SOCIAL HISTORY: Social History   Socioeconomic History   Marital status: Single    Spouse name: Not on file   Number of children: Not on file   Years of education: Not on file   Highest education level: Not on file  Occupational History   Not on file  Tobacco Use   Smoking status: Former    Packs/day: 0.25    Years: 25.00    Pack years: 6.25    Types: Cigarettes    Quit date: 08/02/2015    Years since quitting: 5.2   Smokeless tobacco: Never  Vaping  Use   Vaping Use: Never used  Substance and Sexual Activity   Alcohol use: Not Currently    Alcohol/week: 0.0 standard drinks    Comment: socially   Drug use: No   Sexual activity: Not Currently    Birth control/protection: None  Other Topics Concern   Not on file  Social History Narrative   Lives with daughter   Right Handed   Drinks 1 cup caffeine daily   Social Determinants of Health   Financial Resource Strain: Not on file  Food Insecurity: Not on file  Transportation Needs: Not on file  Physical Activity: Not on file  Stress: Not on file  Social Connections: Not on file  Intimate Partner Violence: Not on file       Marcial Pacas, M.D. Ph.D.  [facility] [address] [place], [place] [address] Ph: [phone] Fax: [phone]

## 2020-10-22 ENCOUNTER — Ambulatory Visit (AMBULATORY_SURGERY_CENTER): Payer: Medicaid Other | Admitting: *Deleted

## 2020-10-22 ENCOUNTER — Other Ambulatory Visit: Payer: Self-pay

## 2020-10-22 VITALS — Ht 64.0 in | Wt 266.0 lb

## 2020-10-22 DIAGNOSIS — Z1211 Encounter for screening for malignant neoplasm of colon: Secondary | ICD-10-CM

## 2020-10-22 NOTE — Progress Notes (Signed)
Virtual  pre-visit completed over telephone . Instructions mailed and sent through [place].   No egg or soy allergy known to patient  No issues with past sedation with any surgeries or procedures Patient denies ever being told they had issues or difficulty with intubation  No FH of Malignant Hyperthermia No diet pills per patient No home 02 use per patient  No blood thinners per patient   Pt denies issues with constipation   No A fib or A flutter  EMMI video to pt or via [place] 19 guidelines implemented in PV today with Pt and RN   Due to the COVID-19 pandemic we are asking patients to follow certain guidelines.  Pt aware of COVID protocols and [facility] guidelines

## 2020-11-06 DIAGNOSIS — Z862 Personal history of diseases of the blood and blood-forming organs and certain disorders involving the immune mechanism: Secondary | ICD-10-CM | POA: Diagnosis not present

## 2020-11-06 DIAGNOSIS — E1129 Type 2 diabetes mellitus with other diabetic kidney complication: Secondary | ICD-10-CM | POA: Diagnosis not present

## 2020-11-06 DIAGNOSIS — E1122 Type 2 diabetes mellitus with diabetic chronic kidney disease: Secondary | ICD-10-CM | POA: Diagnosis not present

## 2020-11-06 DIAGNOSIS — Z6841 Body Mass Index (BMI) 40.0 and over, adult: Secondary | ICD-10-CM | POA: Diagnosis not present

## 2020-11-06 DIAGNOSIS — G40909 Epilepsy, unspecified, not intractable, without status epilepticus: Secondary | ICD-10-CM | POA: Diagnosis not present

## 2020-11-06 DIAGNOSIS — N183 Chronic kidney disease, stage 3 unspecified: Secondary | ICD-10-CM | POA: Diagnosis not present

## 2020-11-07 ENCOUNTER — Telehealth: Payer: Self-pay

## 2020-11-07 NOTE — Telephone Encounter (Signed)
Spoke with patient regarding cancelling her procedure for 11/09/20. I let patient know that because of her history of difficult intubation and IV complications she would have to be done at the hospital. Patient has been scheduled for a office visit 11/30/20 @ 1:50 pm. Patient had no questions at the end of call and stated she would see [place] on 11/30/20.

## 2020-11-09 ENCOUNTER — Encounter: Payer: Medicaid Other | Admitting: Gastroenterology

## 2020-11-16 ENCOUNTER — Ambulatory Visit: Payer: Medicaid Other | Attending: Internal Medicine | Admitting: Pharmacist

## 2020-11-16 ENCOUNTER — Encounter: Payer: Self-pay | Admitting: Pharmacist

## 2020-11-16 ENCOUNTER — Other Ambulatory Visit: Payer: Self-pay

## 2020-11-16 DIAGNOSIS — N183 Chronic kidney disease, stage 3 unspecified: Secondary | ICD-10-CM

## 2020-11-16 DIAGNOSIS — E1122 Type 2 diabetes mellitus with diabetic chronic kidney disease: Secondary | ICD-10-CM

## 2020-11-16 DIAGNOSIS — Z794 Long term (current) use of insulin: Secondary | ICD-10-CM | POA: Diagnosis not present

## 2020-11-16 LAB — GLUCOSE, POCT (MANUAL RESULT ENTRY): POC Glucose: 101 mg/dl — AB (ref 70–99)

## 2020-11-16 MED ORDER — INSULIN DETEMIR 100 UNIT/ML ~~LOC~~ SOLN
13.0000 [IU] | Freq: Two times a day (BID) | SUBCUTANEOUS | 11 refills | Status: DC
Start: 2020-11-16 — End: 2020-12-14

## 2020-11-16 MED ORDER — TRULICITY 0.75 MG/0.5ML ~~LOC~~ SOAJ: 0.7500 mg | SUBCUTANEOUS | 0 refills | Status: DC

## 2020-11-16 NOTE — Progress Notes (Signed)
S:     No chief complaint on file.  Patient arrives in good spirits.  Presents for diabetes evaluation, education, and management. Patient was referred and last seen by Primary Care Provider on 10/09/2020.   Patient reports Diabetes was diagnosed in 2018. She tells me her high sugars have come back down to goal. She tells me her sugars were high because of an infection. Now, she is experiencing near goal CBG control at home. She has not had to use her sliding scale. She wonders if she can take fewer injections daily. She is only taking 15 units BID of Levemir instead of the 17u BID rx. She used to take Bydureon. This was stopped in the hospital after DKA stay in July.    Family/Social History:  -Fhx: HTN, DM -Tobacco: former smoker -Alcohol: none reported   Insurance coverage/medication affordability: [place] Medicaid [employer] Blue   Medication adherence reported but takes differently.   Current diabetes medications include: Levemir 17 units BID (takes 15u BID), Novolog sliding scale (no longer has to take d/t goal blood sugar at home)  Patient denies hypoglycemic events.  Patient reported dietary habits:  - Has eliminated most sugars and carbs from her diet since her visit with Dr. Wynetta Emery  Patient-reported exercise habits:  - Not exercising much    Patient denies nocturia (nighttime urination).  Patient denies neuropathy (nerve pain). Patient denies visual changes. Patient reports self foot exams.     O:  POCT: 101  Lab Results  Component Value Date   HGBA1C 14.9 (H) 09/27/2020   There were no vitals filed for this visit.  Lipid Panel     Component Value Date/Time   CHOL 189 05/09/2019 1153   TRIG 131 05/09/2019 1153   HDL 47 05/09/2019 1153   CHOLHDL 4.0 05/09/2019 1153   CHOLHDL 4.2 12/21/2015 1019   VLDL 34 (H) 12/21/2015 1019   LDLCALC 119 (H) 05/09/2019 1153    Home fasting blood sugars: reports mostly 130s-140s.  2 hour post-meal/random blood sugars: Denies  readings >150 since visit with Dr. Wynetta Emery .  Clinical Atherosclerotic Cardiovascular Disease (ASCVD): No  The 10-year ASCVD risk score (Arnett DK, et al., 2019) is: 6.4%   Values used to calculate the score:     Age: 12 years     Sex: Female     Is Non-Hispanic African American: Yes     Diabetic: Yes     Tobacco smoker: No     Systolic Blood Pressure: [id number] mmHg     Is BP treated: No     HDL Cholesterol: 47 mg/dL     Total Cholesterol: 189 mg/dL    A/P: Diabetes longstanding currently uncontrolled based on A1c. Patient is able to verbalize appropriate hypoglycemia management plan. Medication adherence appears okay. Since she is not taking the Novolog I have d/c'd this. She used to be on Bydureon. Will place on Trulicity instead. She is interested in weight loss. Will decrease Levemir to avoid hypoglycemia in the setting of her improved CBG control at home. -Started Trulicity [id number] mg weekly.  -Decreased dose of Levemir to 13 units BID.  -Discontinued Novolog for now.  -Extensively discussed pathophysiology of diabetes, recommended lifestyle interventions, dietary effects on blood sugar control -Counseled on s/sx of and management of hypoglycemia -Next A1C anticipated 12/2020.   Written patient instructions provided.  Total time in face to face counseling 30 minutes.   Follow up Pharmacist Clinic Visit in 4 months.  Benard Halsted, PharmD, BCACP,  [place] [phone]

## 2020-11-19 ENCOUNTER — Telehealth: Payer: Self-pay

## 2020-11-19 NOTE — Telephone Encounter (Signed)
PA for Trulicity approved until 11/19/2021

## 2020-11-26 ENCOUNTER — Encounter: Payer: Medicaid Other | Attending: Internal Medicine | Admitting: Skilled Nursing Facility1

## 2020-11-26 ENCOUNTER — Other Ambulatory Visit: Payer: Self-pay

## 2020-11-26 ENCOUNTER — Encounter: Payer: Self-pay | Admitting: Skilled Nursing Facility1

## 2020-11-26 DIAGNOSIS — N1832 Chronic kidney disease, stage 3b: Secondary | ICD-10-CM | POA: Diagnosis not present

## 2020-11-26 DIAGNOSIS — N183 Chronic kidney disease, stage 3 unspecified: Secondary | ICD-10-CM | POA: Diagnosis not present

## 2020-11-26 DIAGNOSIS — E1122 Type 2 diabetes mellitus with diabetic chronic kidney disease: Secondary | ICD-10-CM | POA: Diagnosis not present

## 2020-11-26 NOTE — Progress Notes (Signed)
Diabetes Self-Management Education  Visit Type: First/Initial   11/27/2020  Rebecca Johnston, identified by name and date of birth, is a 50 y.o. female with a diagnosis of Diabetes: Type 2.   ASSESSMENT  Height 5\' 4"  (1.626 m), weight 271 lb (122.9 kg), last menstrual period 11/26/2014. Body mass index is 46.52 kg/m.  Pt states she is taking levemir but states she will be coming off of that to only be taking trulicity. Pt states she was only taking insulin due to her blood sugars getting crazy high stating she does not know why (she did have Covid at this time). Pt states her A1C is now 7.9  Pt states she does have GERD stating it is not bad and does have CKD stage 3.   Pt states she only had a seizure one time stating it was an allergic reaction.   Pt states she checks her blood sugars fasting (typically 139, 138, 150) and late afternoon before dinner (159).  Goals: Brush and floss daily Check your feet daily for anything that was not there the day before  Aim for 6 hours of restful sleep each night Do not eat anything including peanuts after 8:30 pm  Pt states she used to eat in the middle of night but has stopped that behavior: after some conversation advised even peanuts   Pt states she gets home about 7:30 pm and is up at 8am.     Diabetes Self-Management Education - 11/26/20 1537       Visit Information   Visit Type First/Initial      Initial Visit   Diabetes Type Type 2    Are you currently following a meal plan? No    Are you taking your medications as prescribed? Yes      Health Coping   How would you rate your overall health? Fair      Psychosocial Assessment   Patient Belief/Attitude about Diabetes Motivated to manage diabetes    Self-care barriers None    Self-management support Friends    Patient Concerns Nutrition/Meal planning;Weight Control    Special Needs None    Preferred Learning Style Visual    Learning Readiness Contemplating    How often  do you need to have someone help you when you read instructions, pamphlets, or other written materials from your doctor or pharmacy? 1 - Never      Pre-Education Assessment   Patient understands the diabetes disease and treatment process. Needs Instruction    Patient understands incorporating nutritional management into lifestyle. Needs Instruction    Patient undertands incorporating physical activity into lifestyle. Needs Instruction    Patient understands using medications safely. Needs Instruction    Patient understands monitoring blood glucose, interpreting and using results Needs Instruction    Patient understands prevention, detection, and treatment of acute complications. Needs Instruction    Patient understands prevention, detection, and treatment of chronic complications. Needs Instruction    Patient understands how to develop strategies to address psychosocial issues. Needs Instruction    Patient understands how to develop strategies to promote health/change behavior. Needs Instruction      Complications   Last HgB A1C per patient/outside source 7.9 %    How often do you check your blood sugar? 1-2 times/day    Fasting Blood glucose range (mg/dL) 130-179    Postprandial Blood glucose range (mg/dL) 130-179    Have you had a dilated eye exam in the past 12 months? Yes    Have you had  a dental exam in the past 12 months? No    Are you checking your feet? Yes    How many days per week are you checking your feet? 1      Dietary Intake   Breakfast 9:30-10am 2 eggs scrambled + cheese + oil + 2 bacon + toast    Lunch 1pm: sometimes fast food salad + ranch    Dinner 8: fast food    Snack (evening) peanuts or jello    Beverage(s) coffee + splenda + half and half creamer, water, diet dr pepper      Exercise   Exercise Type ADL's    How many days per week to you exercise? 0    How many minutes per day do you exercise? 0    Total minutes per week of exercise 0      Patient Education    Previous Diabetes Education No    Disease state  Definition of diabetes, type 1 and 2, and the diagnosis of diabetes;Factors that contribute to the development of diabetes    Acute complications Taught treatment of hypoglycemia - the 15 rule.      Individualized Goals (developed by patient)   Nutrition Follow meal plan discussed;General guidelines for healthy choices and portions discussed    Physical Activity Exercise 5-7 days per week;30 minutes per day    Medications take my medication as prescribed    Monitoring  test my blood glucose as discussed;test blood glucose pre and post meals as discussed      Post-Education Assessment   Patient understands the diabetes disease and treatment process. Demonstrates understanding / competency    Patient understands incorporating nutritional management into lifestyle. Demonstrates understanding / competency    Patient undertands incorporating physical activity into lifestyle. Demonstrates understanding / competency    Patient understands using medications safely. Demonstrates understanding / competency    Patient understands monitoring blood glucose, interpreting and using results Demonstrates understanding / competency    Patient understands prevention, detection, and treatment of acute complications. Demonstrates understanding / competency    Patient understands prevention, detection, and treatment of chronic complications. Demonstrates understanding / competency    Patient understands how to develop strategies to address psychosocial issues. Demonstrates understanding / competency    Patient understands how to develop strategies to promote health/change behavior. Demonstrates understanding / competency      Outcomes   Expected Outcomes Demonstrated interest in learning. Expect positive outcomes    Future DMSE PRN    Program Status Completed             Individualized Plan for Diabetes Self-Management Training:   Learning Objective:   Patient will have a greater understanding of diabetes self-management. Patient education plan is to attend individual and/or group sessions per assessed needs and concerns.   Expected Outcomes:  Demonstrated interest in learning. Expect positive outcomes  Education material provided: ADA - How to Thrive: A Guide for Your Journey with Diabetes and Meal plan card  If problems or questions, patient to contact team via:  Phone and Email  Future DSME appointment: PRN

## 2020-11-30 ENCOUNTER — Encounter: Payer: Self-pay | Admitting: Gastroenterology

## 2020-11-30 ENCOUNTER — Other Ambulatory Visit (INDEPENDENT_AMBULATORY_CARE_PROVIDER_SITE_OTHER): Payer: Medicaid Other

## 2020-11-30 ENCOUNTER — Ambulatory Visit (INDEPENDENT_AMBULATORY_CARE_PROVIDER_SITE_OTHER): Payer: Medicaid Other | Admitting: Gastroenterology

## 2020-11-30 VITALS — BP 128/80 | HR 79 | Ht 64.0 in | Wt 275.0 lb

## 2020-11-30 DIAGNOSIS — R748 Abnormal levels of other serum enzymes: Secondary | ICD-10-CM

## 2020-11-30 DIAGNOSIS — E1110 Type 2 diabetes mellitus with ketoacidosis without coma: Secondary | ICD-10-CM | POA: Diagnosis not present

## 2020-11-30 DIAGNOSIS — Z1211 Encounter for screening for malignant neoplasm of colon: Secondary | ICD-10-CM

## 2020-11-30 DIAGNOSIS — K219 Gastro-esophageal reflux disease without esophagitis: Secondary | ICD-10-CM

## 2020-11-30 LAB — HEMOGLOBIN A1C: Hgb A1c MFr Bld: 8 % — ABNORMAL HIGH (ref 4.6–6.5)

## 2020-11-30 LAB — GAMMA GT: GGT: 25 U/L (ref 7–51)

## 2020-11-30 NOTE — Progress Notes (Signed)
HPI : Rebecca Johnston is a very pleasant 50 year old female with a history of obesity, poorly controlled diabetes, CKD III and chronic tobacco use who is referred to [place] for her initial screening colonoscopy.  The patient denies any chronic lower GI symptoms to include abdominal pain, constipation, diarrhea or hematochezia.  She has no know family history of colon cancer.  She does have a history of GERD, but her symptoms are controlled with Protonix.  She does not take the Protonix everyday,  She was hospitalized for diabetic ketoacidosis in late July of this year.  At that time her A1c was found to be 15.  Since then, she was started on NovoLog and Levemir and her blood sugars have been much improved.  She states typically in the mornings they are in the 120s to 130s.  Her insulin requirement has decreased recently and she is about to start Trulicity.  She has not had her A1c rechecked since hospitalization.  She denies any symptoms of chest pain/pressure, shortness of breath, lightheadedness/dizziness, palpitations.  She has stable 1 pillow orthopnea present for years.  She underwent evaluation last year because of chest pain, which included a normal echocardiogram (EF 49%, grade 1 diastolic dysfunction) and an unremarkable pharmacologic stress test. The patient was scheduled for a direct access colonoscopy earlier this month, but her procedure was apparently canceled because of a history of difficult intubation and IV access.  The patient is not aware of any history of being a difficult intubation.  I reviewed her anesthesia records from her surgeries in 2015 and 2016, and neither reflected any suggestion of a difficult airway or any anesthesia complications.  The patient does say that they had trouble finding a vein during her recent admission to the hospital, but that is the only time she has ever been told she was a difficult stick.  Patient is also noted to have a mild chronic alk phos elevation, with  otherwise normal liver enzymes.  Past Medical History:  Diagnosis Date   Abnormal CT scan, chest 09/02/2013   CTa 04/18/13  1. Study limited by body habitus with no pulmonary embolism  detected.  2. Multiple borderline and a few mildly enlarged nonspecific  mediastinal and hilar lymph nodes. Multiple pulmonary nodules  bilaterally the largest measuring 8 mm. If the patient is at high  risk for bronchogenic carcinoma, follow-up chest CT at 3-39month is  recommended.  - 12/15/13  1. Interval resolution of th   Anemia    Anemia due to stage 3 chronic kidney disease ([facility] 07/27/2018   Anemia of chronic disease 06/17/2018   06/2018   Asthma    CKD (chronic kidney disease), stage III ([facility] 04/16/2017   Class 3 severe obesity due to excess calories with serious comorbidity and body mass index (BMI) of 40.0 to 44.9 in adult ([facility] 06/15/2018   Diabetes mellitus without complication ([place] [id number]  Elevated blood pressure reading 05/09/2019   Fibroids    GERD (gastroesophageal reflux disease)    History of blood transfusion 04/2013   Hyperlipidemia    Hyperlipidemia associated with type 2 diabetes mellitus ([facility] 05/11/2019   Incarcerated hernia s/p lap repair w mesh 12/08/2014 12/08/2014   Neuropathy 04/04/2016   Obesity, Class III, BMI 40-49.9 (morbid obesity) ([place]    Seasonal allergic rhinitis 12/21/2015   Seizure disorder ([facility] 05/14/2018   Shortness of breath dyspnea    Sleep apnea    Tobacco dependence 10/21/2013   Type II diabetes mellitus with  renal manifestations ([place]) 12/21/2015   Vitamin D deficiency 06/15/2018     Past Surgical History:  Procedure Laterality Date   ABDOMINAL HYSTERECTOMY N/A 12/08/2014   Procedure: HYSTERECTOMY ABDOMINAL;  Surgeon: Woodroe Mode, MD;  Location: [facility];  Service: Gynecology;  Laterality: N/A;   DILATION AND CURETTAGE OF UTERUS     DILITATION & CURRETTAGE/HYSTROSCOPY WITH VERSAPOINT RESECTION N/A 01/06/2014   Procedure: Corena Pilgrim WITH VERSAPOINT;   Surgeon: Woodroe Mode, MD;  Location: [facility];  Service: Gynecology;  Laterality: N/A;   LAPAROSCOPIC LYSIS OF ADHESIONS N/A 12/08/2014   Procedure: LAPAROSCOPIC LYSIS OF ADHESIONS;  Surgeon: Michael Boston, MD;  Location: [facility];  Service: General;  Laterality: N/A;   OVARIAN CYST REMOVAL     cyst not removed. Cyst drained   SUPRA-UMBILICAL HERNIA N/A 26/10/3417   Procedure: LAPARSCOPIC SUPRA-UMBILICAL HERNIA REPAIR ;  Surgeon: Michael Boston, MD;  Location: [facility];  Service: General;  Laterality: N/A;   VENTRAL HERNIA REPAIR N/A 12/08/2014   Procedure: LAPAROSCOPIC INCARCERATED INCISIONAL VENTRAL WALL HERNIA REPAIR;  Surgeon: Michael Boston, MD;  Location: [facility];  Service: General;  Laterality: N/A;   Family History  Problem Relation Age of Onset   Hypertension Mother    Diabetes Father    Breast cancer Maternal Aunt    Diabetes Maternal Aunt    Diabetes Maternal Aunt    Diabetes Maternal Aunt    Diabetes Paternal Aunt    Colon polyps Neg Hx    Colon cancer Neg Hx    Esophageal cancer Neg Hx    Stomach cancer Neg Hx    Rectal cancer Neg Hx    Social History   Tobacco Use   Smoking status: Former    Packs/day: 0.25    Years: 25.00    Pack years: 6.25    Types: Cigarettes    Quit date: 10/02/2019    Years since quitting: 1.1   Smokeless tobacco: Never  Vaping Use   Vaping Use: Never used  Substance Use Topics   Alcohol use: Not Currently    Alcohol/week: 0.0 standard drinks    Comment: socially   Drug use: No   Current Outpatient Medications  Medication Sig Dispense Refill   Accu-Chek Softclix Lancets lancets Use to check blood sugar once daily. 100 each 2   atorvastatin (LIPITOR) 10 MG tablet Take 1 tablet (10 mg total) by mouth daily. 30 tablet 5   blood glucose meter kit and supplies Dispense based on patient and insurance preference. Use up to four times daily as directed. (FOR ICD-10 E10.9, E11.9). 1 each 0   Blood Glucose Monitoring Suppl (ACCU-CHEK GUIDE ME) w/Device  KIT Use to check blood sugar once daily. 1 kit 0   cetirizine (ZYRTEC) 10 MG tablet Take 1 tablet (10 mg total) by mouth daily. 30 tablet 2   fluticasone (FLONASE) 50 MCG/ACT nasal spray USE 1 SPRAY(S) IN EACH NOSTRIL ONCE DAILY AS NEEDED FOR ALLERGIES OR  RHINITIS 16 g 2   glucose blood (ACCU-CHEK AVIVA PLUS) test strip Use to check blood sugar once daily. 100 each 2   insulin detemir (LEVEMIR) 100 UNIT/ML injection Inject 0.13 mLs (13 Units total) into the skin 2 (two) times daily. 10 mL 11   pantoprazole (PROTONIX) 40 MG tablet Take 1 tablet (40 mg total) by mouth daily. 90 tablet 3   SUMAtriptan (IMITREX) 50 MG tablet May repeat in 2 hours if headache persists or recurs. 12 tablet 11   topiramate (TOPAMAX) 100 MG tablet  Take 1 tablet (100 mg total) by mouth 2 (two) times daily. 180 tablet 4   Dulaglutide (TRULICITY) 7.00 FV/4.9SW SOPN Inject 0.75 mg into the skin once a week. (Patient not taking: Reported on 11/30/2020) 2 mL 0   Polyethylene Glycol 3350 (MIRALAX PO) Take 238 g by mouth. (Patient not taking: Reported on 11/30/2020)     No current facility-administered medications for this visit.   Allergies  Allergen Reactions   Doxycycline     "bad dreaming, hallucination, dizziness" per pt     Review of Systems: All systems reviewed and negative except where noted in HPI.    No results found.  Physical Exam: BP 128/80   Pulse 79   Ht 5' 4" (1.626 m)   Wt 275 lb (124.7 kg)   LMP 11/26/2014 (Exact Date)   SpO2 99%   BMI 47.20 kg/m  Constitutional: Pleasant,well-developed, obese African-American female in no acute distress. HEENT: Normocephalic and atraumatic. Conjunctivae are normal. No scleral icterus.  Mallampati 2 Neck supple.  Cardiovascular: Normal rate, regular rhythm.  Pulmonary/chest: Effort normal and breath sounds normal. No wheezing, rales or rhonchi. Abdominal: Soft, nondistended, nontender. Bowel sounds active throughout. There are no masses palpable. No  hepatomegaly. Extremities: no edema Neurological: Alert and oriented to person place and time. Skin: Skin is warm and dry. No rashes noted. Psychiatric: Normal mood and affect. Behavior is normal.  CBC    Component Value Date/Time   WBC 7.4 09/27/2020 1027   RBC 6.98 (H) 09/27/2020 1027   HGB 18.0 (H) 09/27/2020 1332   HGB 12.9 05/09/2019 1153   HCT 53.0 (H) 09/27/2020 1332   HCT 40.5 05/09/2019 1153   PLT 277 09/27/2020 1027   PLT 303 05/09/2019 1153   MCV 69.3 (L) 09/27/2020 1027   MCV 69 (L) 05/09/2019 1153   MCH 21.2 (L) 09/27/2020 1027   MCHC 30.6 09/27/2020 1027   RDW 17.3 (H) 09/27/2020 1027   RDW 15.5 (H) 05/09/2019 1153   LYMPHSABS 1.4 05/17/2018 0640   MONOABS 0.7 05/17/2018 0640   EOSABS 0.2 05/17/2018 0640   BASOSABS 0.0 05/17/2018 0640    CMP     Component Value Date/Time   NA 133 (L) 09/28/2020 0300   NA 140 05/09/2019 1153   K 3.4 (L) 09/28/2020 0300   CL 103 09/28/2020 0300   CO2 21 (L) 09/28/2020 0300   GLUCOSE 277 (H) 09/28/2020 0300   GLUCOSE 79 05/06/2013 1240   BUN 18 09/28/2020 0300   BUN 21 05/09/2019 1153   CREATININE 1.58 (H) 09/28/2020 0300   CREATININE 1.65 (H) 12/21/2015 1019   CALCIUM 9.2 09/28/2020 0300   PROT 9.1 (H) 09/27/2020 1330   PROT 7.7 05/09/2019 1153   ALBUMIN 4.5 09/27/2020 1330   ALBUMIN 4.3 05/09/2019 1153   AST 16 09/27/2020 1330   ALT 11 09/27/2020 1330   ALKPHOS 170 (H) 09/27/2020 1330   BILITOT 0.6 09/27/2020 1330   BILITOT <0.2 05/09/2019 1153   GFRNONAA 40 (L) 09/28/2020 0300   GFRNONAA 37 (L) 12/21/2015 1019   GFRAA 34 (L) 05/09/2019 1153   GFRAA 43 (L) 12/21/2015 1019     ASSESSMENT AND PLAN: 50 year old female with poorly controlled diabetes (A1c 15), fairly recent admission for DKA, CKDIII and morbid obesity due for initial average rescreening colonoscopy.  Although she has multiple comorbidities, I do not see a contraindication to her having her procedure performed at our endoscopy center as opposed to  the hospital.  I will confer with our head  CRNA to see if there are concerns from his standpoint.  For now, we will plan to reschedule her colonoscopy for the [place].  I would like to repeat her A1c to ensure that it has improved from 15, as this will provide some reassurance that her diabetes is better controlled than it was back in July.  We will get a GGT to further evaluate her isolated alkaline phosphatase elevation.  If GGT is elevated, consider MRCP.  Cancer screening - Screening colonoscopy to be done at lab our endoscopy center - Lead CRNA asked to review her medical records to ensure that this is reasonable  Isolated alkaline phosphatase elevation - GGT  Uncontrolled diabetes - Repeat A1c  GERD - Continue Protonix  The details, risks (including bleeding, perforation, infection, missed lesions, medication reactions and possible hospitalization or surgery if complications occur), benefits, and alternatives to colonoscopy with possible biopsy and possible polypectomy were discussed with the patient and she consents to proceed.   I spent a total of 45 minutes reviewing the patient's medical record, interviewing and examining the patient, discussing her diagnosis and management of her condition going forward, and documenting in the medical record  Sumi Lye E. Candis Schatz, MD [facility]    CC:  Ladell Pier, MD

## 2020-11-30 NOTE — Patient Instructions (Addendum)
If you are age 51 or older, your body mass index should be between 23-30. Your Body mass index is 47.2 kg/m. If this is out of the aforementioned range listed, please consider follow up with your Primary Care Provider.  If you are age 39 or younger, your body mass index should be between 19-25. Your Body mass index is 47.2 kg/m. If this is out of the aformentioned range listed, please consider follow up with your Primary Care Provider.   Your provider has requested that you go to the basement level for lab work before leaving today. Press "B" on the elevator. The lab is located at the first door on the left as you exit the elevator.  The [facility] providers would like to encourage you to use [facility] to communicate with providers for non-urgent requests or questions.  Due to long hold times on the telephone, sending your provider a message by [facility] may be a faster and more efficient way to get a response.  Please allow 48 business hours for a response.  Please remember that this is for non-urgent requests.   You have been scheduled for a colonoscopy. Please follow written instructions given to you at your visit today.  Please pick up your prep supplies at the pharmacy within the next 1-3 days. If you use inhalers (even only as needed), please bring them with you on the day of your procedure.  Thank you for choosing me and [facility].  Scott E.Candis Schatz, MD

## 2020-12-11 NOTE — Progress Notes (Signed)
Rebecca Johnston, your GGT test was normal, indicating that the elevated alkaline phosphatase levels on your liver tests, are likely not related to any liver problems.  Alkaline phosphatase can also be elevated in the setting of arthritis.  Your hemoglobin A1c is also much better than it was earlier in the year (8 compared to 15).  Please continue working on your blood sugar control as you have been doing.  We will see you next month for your colonoscopy

## 2020-12-13 ENCOUNTER — Ambulatory Visit: Payer: Medicaid Other | Admitting: Pharmacist

## 2020-12-14 ENCOUNTER — Other Ambulatory Visit: Payer: Self-pay

## 2020-12-14 ENCOUNTER — Ambulatory Visit: Payer: Medicaid Other | Attending: Internal Medicine | Admitting: Pharmacist

## 2020-12-14 DIAGNOSIS — N183 Chronic kidney disease, stage 3 unspecified: Secondary | ICD-10-CM

## 2020-12-14 DIAGNOSIS — E1122 Type 2 diabetes mellitus with diabetic chronic kidney disease: Secondary | ICD-10-CM | POA: Diagnosis not present

## 2020-12-14 DIAGNOSIS — Z794 Long term (current) use of insulin: Secondary | ICD-10-CM | POA: Diagnosis not present

## 2020-12-14 MED ORDER — INSULIN DETEMIR 100 UNIT/ML ~~LOC~~ SOLN: 10.0000 [IU] | Freq: Two times a day (BID) | SUBCUTANEOUS | 2 refills | Status: DC

## 2020-12-14 MED ORDER — TRULICITY 1.5 MG/0.5ML ~~LOC~~ SOAJ: 1.5000 mg | SUBCUTANEOUS | 2 refills | Status: DC

## 2020-12-14 NOTE — Progress Notes (Signed)
    S:     No chief complaint on file.  Patient arrives in good spirits.  Presents for diabetes evaluation, education, and management. Patient was referred and last seen by Primary Care Provider on 10/09/2020. We last saw her on 11/16/2020. We started Trulicity and stopped Novolog. We also decreased Levemir to 13 units BID. She tells me today that she started the Trulicity ~2 weeks ago. She has taken two injections so far and has tolerated these well. Denies NV, abdominal pain. Denies any visual changes.   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   Current diabetes medications include: Levemir 13 units BID, Trulicity 6.37 mg weekly (has taken 2 shots so far)  Patient denies hypoglycemic events.  Patient reported dietary habits:  - Has eliminated most sugars and carbs from her diet since her visit with Dr. Wynetta Emery  Patient-reported exercise habits:  - Not exercising much    Patient denies nocturia (nighttime urination).  Patient denies neuropathy (nerve pain). Patient denies visual changes. Patient reports self foot exams.     O:   Lab Results  Component Value Date   HGBA1C 8.0 (H) 11/30/2020   There were no vitals filed for this visit.  Lipid Panel     Component Value Date/Time   CHOL 189 05/09/2019 1153   TRIG 131 05/09/2019 1153   HDL 47 05/09/2019 1153   CHOLHDL 4.0 05/09/2019 1153   CHOLHDL 4.2 12/21/2015 1019   VLDL 34 (H) 12/21/2015 1019   LDLCALC 119 (H) 05/09/2019 1153    Home fasting blood sugars: reports mostly 130s-140s.  2 hour post-meal/random blood sugars: Denies readings >150 since visit with Dr. Wynetta Emery .  Clinical Atherosclerotic Cardiovascular Disease (ASCVD): No  The 10-year ASCVD risk score (Arnett DK, et al., 2019) is: 5.9%   Values used to calculate the score:     Age: 50 years     Sex:  Female     Is Non-Hispanic African American: Yes     Diabetic: Yes     Tobacco smoker: No     Systolic Blood Pressure: 858 mmHg     Is BP treated: No     HDL Cholesterol: 47 mg/dL     Total Cholesterol: 189 mg/dL    A/P: Diabetes longstanding currently above goal but A1c shows good improvement. Patient is able to verbalize appropriate hypoglycemia management plan. Medication adherence appears appropriate.  -Increase Trulicity to 1.5 mg weekly.  -Decreased dose of Levemir to 10 units BID.  -Extensively discussed pathophysiology of diabetes, recommended lifestyle interventions, dietary effects on blood sugar control -Counseled on s/sx of and management of hypoglycemia -Next A1C anticipated 03/2021.   Written patient instructions provided.  Total time in face to face counseling 30 minutes.   Follow up Pharmacist Clinic Visit in 4 months.  Benard Halsted, PharmD, [place], [place] [phone]

## 2021-01-11 ENCOUNTER — Encounter: Payer: Self-pay | Admitting: Internal Medicine

## 2021-01-11 ENCOUNTER — Other Ambulatory Visit: Payer: Self-pay

## 2021-01-11 ENCOUNTER — Ambulatory Visit: Payer: Medicaid Other | Attending: Internal Medicine | Admitting: Internal Medicine

## 2021-01-11 VITALS — BP 128/88 | HR 91 | Ht 64.0 in | Wt 274.8 lb

## 2021-01-11 DIAGNOSIS — Z6841 Body Mass Index (BMI) 40.0 and over, adult: Secondary | ICD-10-CM

## 2021-01-11 DIAGNOSIS — Z794 Long term (current) use of insulin: Secondary | ICD-10-CM

## 2021-01-11 DIAGNOSIS — E1122 Type 2 diabetes mellitus with diabetic chronic kidney disease: Secondary | ICD-10-CM | POA: Diagnosis not present

## 2021-01-11 DIAGNOSIS — Z2821 Immunization not carried out because of patient refusal: Secondary | ICD-10-CM | POA: Diagnosis not present

## 2021-01-11 DIAGNOSIS — R030 Elevated blood-pressure reading, without diagnosis of hypertension: Secondary | ICD-10-CM

## 2021-01-11 DIAGNOSIS — N183 Chronic kidney disease, stage 3 unspecified: Secondary | ICD-10-CM | POA: Diagnosis not present

## 2021-01-11 LAB — GLUCOSE, POCT (MANUAL RESULT ENTRY): POC Glucose: 136 mg/dl — AB (ref 70–99)

## 2021-01-11 NOTE — Progress Notes (Signed)
Patient ID: Rebecca Johnston, female    DOB: [birth year]  MRN: [id number]  CC: Diabetes   Subjective: Rebecca Johnston is a 50 y.o. female who presents for chronic ds management Her concerns today include:  DM with neuropathy, obesity, CKD 3, former tob dep, partial SZ, ACD, Vit D def, migraine w/o aura.   DIABETES TYPE 2/Obesity Last A1C:   Results for orders placed or performed in visit on 01/11/21  POCT glucose (manual entry)  Result Value Ref Range   POC Glucose 136 (A) 70 - 99 mg/dl   Lab Results  Component Value Date   HGBA1C 8.0 (H) 11/30/2020   Med Adherence:  <VZDGLOVFIEPPIRJJ>_8<\/ACZYSAYTKZSWFUXN>_2  Yes  -Trulicity, Levevir 10 units BID.  She has been seeing a clinical pharmacist.  She was referred to endocrinology on last visit with me but patient states she never did get an appointment. Medication side effects:  _1  Yes    _2  No Home Monitoring?  _3  Yes.  Previously she was checking her blood sugars twice a day.  She reports that blood sugars before breakfast was running between 120-130.  After seeing the nutritionist she started checking blood sugars just once a day in afternoon after lunch but less than 2 hrs after.  Reports blood sugar readings after lunch in the 120s. Home glucose results range:120s Diet Adherence: _4  Yes -limiting white carbs, no sugary drinks   _5  No Exercise: _6  Yes    _7  No - not much.  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 _10  Yes    _11  No Retinopathy hx? _12  Yes    _13  No Last eye exam:  Comments:   CKD: Last saw her kidney specialist earlier this year.  She states she is seen about every 6 months.  GFR in the 130s to 140s.   Blood pressure noted to be elevated today.  Not limiting salt as much as she should in foods.  Tob dep:  stopped Aug of last yr  HM: declines all vaccines including flu shot, pneumonia vaccine and shingles vaccine.  She has been scheduled for colonoscopy. Patient Active Problem List   Diagnosis  Date Noted   DKA (diabetic ketoacidosis) ([place]) 09/27/2020   DKA, type 2 ([place]) 09/27/2020   Acute-on-chronic kidney injury ([place]) 09/27/2020   Visual complaint 09/27/2020   History of COVID-19 09/27/2020   Sleep apnea 04/17/2020   Seizure ([place]) 04/17/2020   Chronic migraine w/o aura w/o status migrainosus, not intractable 04/17/2020   Migraine 10/11/2019   Hyperlipidemia associated with type 2 diabetes mellitus ([place]) 05/11/2019   Elevated blood pressure reading 05/09/2019   Anemia due to stage 3 chronic kidney disease ([place]) 07/27/2018   Anemia of chronic disease 06/17/2018   Vitamin D deficiency 06/15/2018   Class 3 severe obesity due to excess calories with serious comorbidity and body mass index (BMI) of 40.0 to 44.9 in adult ([place]) 06/15/2018   Seizure disorder ([place]) 05/14/2018   GERD (gastroesophageal reflux disease) 05/07/2018   CKD (chronic kidney disease), stage III ([place]) 04/16/2017   Neuropathy 04/04/2016   Seasonal allergic rhinitis 12/21/2015   Type II diabetes mellitus with renal manifestations ([place]) 12/21/2015   Incarcerated hernia s/p lap repair w mesh 12/08/2014 12/08/2014   Tobacco dependence 10/21/2013   Abnormal CT scan, chest 09/02/2013     Current Outpatient Medications on File Prior to Visit  Medication Sig Dispense Refill   Accu-Chek Softclix Lancets lancets  Use to check blood sugar once daily. 100 each 2   atorvastatin (LIPITOR) 10 MG tablet Take 1 tablet (10 mg total) by mouth daily. 30 tablet 5   blood glucose meter kit and supplies Dispense based on patient and insurance preference. Use up to four times daily as directed. (FOR ICD-10 E10.9, E11.9). 1 each 0   Blood Glucose Monitoring Suppl (ACCU-CHEK GUIDE ME) w/Device KIT Use to check blood sugar once daily. 1 kit 0   cetirizine (ZYRTEC) 10 MG tablet Take 1 tablet (10 mg total) by mouth daily. 30 tablet 2   Dulaglutide (TRULICITY) 1.5 UJ/8.1XB SOPN Inject 1.5 mg into the skin once a week. 2 mL 2   fluticasone  (FLONASE) 50 MCG/ACT nasal spray USE 1 SPRAY(S) IN EACH NOSTRIL ONCE DAILY AS NEEDED FOR ALLERGIES OR  RHINITIS 16 g 2   glucose blood (ACCU-CHEK AVIVA PLUS) test strip Use to check blood sugar once daily. 100 each 2   insulin detemir (LEVEMIR) 100 UNIT/ML injection Inject 0.1 mLs (10 Units total) into the skin 2 (two) times daily. 10 mL 2   pantoprazole (PROTONIX) 40 MG tablet Take 1 tablet (40 mg total) by mouth daily. 90 tablet 3   Polyethylene Glycol 3350 (MIRALAX PO) Take 238 g by mouth.     SUMAtriptan (IMITREX) 50 MG tablet May repeat in 2 hours if headache persists or recurs. 12 tablet 11   topiramate (TOPAMAX) 100 MG tablet Take 1 tablet (100 mg total) by mouth 2 (two) times daily. 180 tablet 4   No current facility-administered medications on file prior to visit.    Allergies  Allergen Reactions   Doxycycline     "bad dreaming, hallucination, dizziness" per pt    Social History   Socioeconomic History   Marital status: Single    Spouse name: Not on file   Number of children: Not on file   Years of education: Not on file   Highest education level: Not on file  Occupational History   Not on file  Tobacco Use   Smoking status: Former    Packs/day: 0.25    Years: 25.00    Pack years: 6.25    Types: Cigarettes    Quit date: 10/02/2019    Years since quitting: 1.2   Smokeless tobacco: Never  Vaping Use   Vaping Use: Never used  Substance and Sexual Activity   Alcohol use: Not Currently    Alcohol/week: 0.0 standard drinks    Comment: socially   Drug use: No   Sexual activity: Not Currently    Birth control/protection: None  Other Topics Concern   Not on file  Social History Narrative   Lives with daughter   Right Handed   Drinks 1 cup caffeine daily   Social Determinants of Health   Financial Resource Strain: Not on file  Food Insecurity: Not on file  Transportation Needs: Not on file  Physical Activity: Not on file  Stress: Not on file  Social  Connections: Not on file  Intimate Partner Violence: Not on file    Family History  Problem Relation Age of Onset   Hypertension Mother    Diabetes Father    Breast cancer Maternal Aunt    Diabetes Maternal Aunt    Diabetes Maternal Aunt    Diabetes Maternal Aunt    Diabetes Paternal Aunt    Colon polyps Neg Hx    Colon cancer Neg Hx    Esophageal cancer Neg Hx    Stomach  cancer Neg Hx    Rectal cancer Neg Hx     Past Surgical History:  Procedure Laterality Date   ABDOMINAL HYSTERECTOMY N/A 12/08/2014   Procedure: HYSTERECTOMY ABDOMINAL;  Surgeon: Woodroe Mode, MD;  Location: [facility];  Service: Gynecology;  Laterality: N/A;   DILATION AND CURETTAGE OF UTERUS     DILITATION & CURRETTAGE/HYSTROSCOPY WITH VERSAPOINT RESECTION N/A 01/06/2014   Procedure: Corena Pilgrim WITH VERSAPOINT;  Surgeon: Woodroe Mode, MD;  Location: [facility];  Service: Gynecology;  Laterality: N/A;   LAPAROSCOPIC LYSIS OF ADHESIONS N/A 12/08/2014   Procedure: LAPAROSCOPIC LYSIS OF ADHESIONS;  Surgeon: Michael Boston, MD;  [facility]: [facility];  Service: General;  Laterality: N/A;   OVARIAN CYST REMOVAL     cyst not removed. Cyst drained   SUPRA-UMBILICAL HERNIA N/A 68/03/1570   Procedure: LAPARSCOPIC SUPRA-UMBILICAL HERNIA REPAIR ;  Surgeon: Michael Boston, MD;  [facility];  Service: General;  Laterality: N/A;   VENTRAL HERNIA REPAIR N/A 12/08/2014   Procedure: LAPAROSCOPIC INCARCERATED INCISIONAL VENTRAL WALL HERNIA REPAIR;  Surgeon: Michael Boston, MD;  [facility];  Service: General;  Laterality: N/A;    ROS: Review of Systems Negative except as stated above  PHYSICAL EXAM: BP (!) 123/92 (BP Location: Right Arm, Patient Position: Sitting, Cuff Size: Large)   Pulse 91   Ht $R'5\' 4"'hQ$  (1.626 m)   Wt 274 lb 12.8 oz (124.6 kg)   LMP 11/26/2014 (Exact Date)   SpO2 97%   BMI 47.17 kg/m   Wt Readings from Last 3 Encounters:  01/11/21 274 lb 12.8 oz (124.6 kg)  11/30/20 275 lb (124.7 kg)  11/26/20 271 lb  (122.9 kg)    Physical Exam  General appearance - alert, well appearing, middle-aged African-American female.  And in no distress Mental status - normal mood, behavior, speech, dress, motor activity, and thought processes Neck - supple, no significant adenopathy Chest - clear to auscultation, no wheezes, rales or rhonchi, symmetric air entry Heart - normal rate, regular rhythm, normal S1, S2, no murmurs, rubs, clicks or gallops Extremities - peripheral pulses normal, no pedal edema, no clubbing or cyanosis   CMP Latest Ref Rng & Units 09/28/2020 09/27/2020 09/27/2020  Glucose 70 - 99 mg/dL 277(H) 181(H) -  BUN 6 - 20 mg/dL 18 18 -  Creatinine 0.44 - 1.00 mg/dL 1.58(H) 1.47(H) -  Sodium 135 - 145 mmol/L 133(L) 136 132(L)  Potassium 3.5 - 5.1 mmol/L 3.4(L) 3.8 4.6  Chloride 98 - 111 mmol/L 103 103 -  CO2 22 - 32 mmol/L 21(L) 23 -  Calcium 8.9 - 10.3 mg/dL 9.2 9.4 -  Total Protein 6.5 - 8.1 g/dL - - -  Total Bilirubin 0.3 - 1.2 mg/dL - - -  Alkaline Phos 38 - 126 U/L - - -  AST 15 - 41 U/L - - -  ALT 0 - 44 U/L - - -   Lipid Panel     Component Value Date/Time   CHOL 189 05/09/2019 1153   TRIG 131 05/09/2019 1153   HDL 47 05/09/2019 1153   CHOLHDL 4.0 05/09/2019 1153   CHOLHDL 4.2 12/21/2015 1019   VLDL 34 (H) 12/21/2015 1019   LDLCALC 119 (H) 05/09/2019 1153    CBC    Component Value Date/Time   WBC 7.4 09/27/2020 1027   RBC 6.98 (H) 09/27/2020 1027   HGB 18.0 (H) 09/27/2020 1332   HGB 12.9 05/09/2019 1153   HCT 53.0 (H) 09/27/2020 1332   HCT 40.5 05/09/2019 1153  PLT 277 09/27/2020 1027   PLT 303 05/09/2019 1153   MCV 69.3 (L) 09/27/2020 1027   MCV 69 (L) 05/09/2019 1153   MCH 21.2 (L) 09/27/2020 1027   MCHC 30.6 09/27/2020 1027   RDW 17.3 (H) 09/27/2020 1027   RDW 15.5 (H) 05/09/2019 1153   LYMPHSABS 1.4 05/17/2018 0640   MONOABS 0.7 05/17/2018 0640   EOSABS 0.2 05/17/2018 0640   BASOSABS 0.0 05/17/2018 0640    ASSESSMENT AND PLAN: 1. Type 2 diabetes  mellitus with stage 3 chronic kidney disease, with long-term current use of insulin, unspecified whether stage 3a or 3b CKD ([place]) Reported blood sugars are good.  Her A1c when last checked several weeks ago was at 8 and had improved.  Encouraged her to continue trying to eat healthy and to move as much as she can.  Went over blood sugar goals before meals and 2 hours after meals. - POCT glucose (manual entry)  2. Elevated blood-pressure reading without diagnosis of hypertension DASH diet discussed and encouraged.  Follow-up with clinical pharmacist in 1 month for repeat blood pressure check.  3. Morbid obesity ([place]) See #1 above.  4. Influenza vaccination declined   5. Pneumococcal vaccination declined      Patient was given the opportunity to ask questions.  Patient verbalized understanding of the plan and was able to repeat key elements of the plan.   Orders Placed This Encounter  Procedures   POCT glucose (manual entry)     Requested Prescriptions    No prescriptions requested or ordered in this encounter    No follow-ups on file.  Karle Plumber, MD, FACP

## 2021-01-11 NOTE — Patient Instructions (Signed)
Your blood pressure is elevated.  Try to limit salt in the foods is much as possible.  We will have you follow-up with the clinical pharmacist in about 1 month for repeat blood pressure check.  Goal for blood blood sugar before meals is 90-130.  Goal for blood sugars 2 hours after meal is less than 180.

## 2021-01-28 ENCOUNTER — Encounter: Payer: Self-pay | Admitting: Certified Registered Nurse Anesthetist

## 2021-01-29 ENCOUNTER — Encounter: Payer: Self-pay | Admitting: Gastroenterology

## 2021-01-29 ENCOUNTER — Ambulatory Visit (AMBULATORY_SURGERY_CENTER): Payer: Medicaid Other | Admitting: Gastroenterology

## 2021-01-29 ENCOUNTER — Other Ambulatory Visit: Payer: Self-pay

## 2021-01-29 VITALS — BP 110/80 | HR 70 | Temp 97.6°F | Resp 20 | Ht 64.0 in | Wt 275.0 lb

## 2021-01-29 DIAGNOSIS — D120 Benign neoplasm of cecum: Secondary | ICD-10-CM | POA: Diagnosis not present

## 2021-01-29 DIAGNOSIS — D123 Benign neoplasm of transverse colon: Secondary | ICD-10-CM

## 2021-01-29 DIAGNOSIS — M1830 Unilateral post-traumatic osteoarthritis of first carpometacarpal joint, unspecified hand: Secondary | ICD-10-CM | POA: Diagnosis not present

## 2021-01-29 DIAGNOSIS — K5730 Diverticulosis of large intestine without perforation or abscess without bleeding: Secondary | ICD-10-CM | POA: Diagnosis not present

## 2021-01-29 DIAGNOSIS — Z1211 Encounter for screening for malignant neoplasm of colon: Secondary | ICD-10-CM

## 2021-01-29 DIAGNOSIS — G4733 Obstructive sleep apnea (adult) (pediatric): Secondary | ICD-10-CM | POA: Diagnosis not present

## 2021-01-29 DIAGNOSIS — K635 Polyp of colon: Secondary | ICD-10-CM | POA: Diagnosis not present

## 2021-01-29 MED ORDER — SODIUM CHLORIDE 0.9 % IV SOLN: 500.0000 mL | Freq: Once | INTRAVENOUS | Status: DC

## 2021-01-29 NOTE — Progress Notes (Signed)
[facility] History and Physical   Primary Care Physician:  Ladell Pier, MD   Reason for Procedure:   Colon cancer screening  Plan:    Screening colonoscopy     HPI: Rebecca Johnston is a 50 y.o. female undergoing initial average risk screening colonoscopy.  She has no family history of colon cancer and no chronic lower GI symptoms. No changes in her medical history or symptoms since her clinic visit Sept 30th.   Past Medical History:  Diagnosis Date   Abnormal CT scan, chest 09/02/2013   CTa 04/18/13  1. Study limited by body habitus with no pulmonary embolism  detected.  2. Multiple borderline and a few mildly enlarged nonspecific  mediastinal and hilar lymph nodes. Multiple pulmonary nodules  bilaterally the largest measuring 8 mm. If the patient is at high  risk for bronchogenic carcinoma, follow-up chest CT at 3-49month is  recommended.  - 12/15/13  1. Interval resolution of th   Anemia    Anemia due to stage 3 chronic kidney disease ([place] 07/27/2018   Anemia of chronic disease 06/17/2018   06/2018   Asthma    CKD (chronic kidney disease), stage III ([place] 04/16/2017   Class 3 severe obesity due to excess calories with serious comorbidity and body mass index (BMI) of 40.0 to 44.9 in adult ([facility] 06/15/2018   Diabetes mellitus without complication ([place] 178/5885  Elevated blood pressure reading 05/09/2019   Fibroids    GERD (gastroesophageal reflux disease)    History of blood transfusion 04/2013   Hyperlipidemia    Hyperlipidemia associated with type 2 diabetes mellitus ([place] 05/11/2019   Incarcerated hernia s/p lap repair w mesh 12/08/2014 12/08/2014   Neuropathy 04/04/2016   Obesity, Class III, BMI 40-49.9 (morbid obesity) ([place]    Seasonal allergic rhinitis 12/21/2015   Seizure disorder ([facility] 05/14/2018   Shortness of breath dyspnea    Sleep apnea    Tobacco dependence 10/21/2013   Type II diabetes mellitus with renal manifestations ([facility] 12/21/2015    Vitamin D deficiency 06/15/2018    Past Surgical History:  Procedure Laterality Date   ABDOMINAL HYSTERECTOMY N/A 12/08/2014   Procedure: HYSTERECTOMY ABDOMINAL;  Surgeon: JWoodroe Mode MD;  [facility];  Service: Gynecology;  Laterality: N/A;   DILATION AND CURETTAGE OF UTERUS     DILITATION & CURRETTAGE/HYSTROSCOPY WITH VERSAPOINT RESECTION N/A 01/06/2014   Procedure: HCorena PilgrimWITH VERSAPOINT;  Surgeon: JWoodroe Mode MD;  [facility];  Service: Gynecology;  Laterality: N/A;   LAPAROSCOPIC LYSIS OF ADHESIONS N/A 12/08/2014   Procedure: LAPAROSCOPIC LYSIS OF ADHESIONS;  Surgeon: SMichael Boston MD;  [facility];  Service: General;  Laterality: N/A;   OVARIAN CYST REMOVAL     cyst not removed. Cyst drained   SUPRA-UMBILICAL HERNIA N/A 102/09/7410  Procedure: LAPARSCOPIC SUPRA-UMBILICAL HERNIA REPAIR ;  Surgeon: SMichael Boston MD;  [facility];  Service: General;  Laterality: N/A;   VENTRAL HERNIA REPAIR N/A 12/08/2014   Procedure: LAPAROSCOPIC INCARCERATED INCISIONAL VENTRAL WALL HERNIA REPAIR;  Surgeon: SMichael Boston MD;  [facility];  Service: General;  Laterality: N/A;    Prior to Admission medications   Medication Sig Start Date End Date Taking? Authorizing Provider  Accu-Chek Softclix Lancets lancets Use to check blood sugar once daily. 08/15/20  Yes JLadell Pier MD  atorvastatin (LIPITOR) 10 MG tablet Take 1 tablet (10 mg total) by mouth daily. 07/06/20  Yes JLadell Pier MD  blood glucose meter kit  and supplies Dispense based on patient and insurance preference. Use up to four times daily as directed. (FOR ICD-10 E10.9, E11.9). 05/26/18  Yes Angiulli, Lavon Paganini, PA-C  Dulaglutide (TRULICITY) 1.5 UR/4.2HC SOPN Inject 1.5 mg into the skin once a week. 12/14/20  Yes Ladell Pier, MD  fluticasone (FLONASE) 50 MCG/ACT nasal spray USE 1 SPRAY(S) IN EACH NOSTRIL ONCE DAILY AS NEEDED FOR ALLERGIES OR  RHINITIS 08/15/20  Yes Ladell Pier, MD   glucose blood (ACCU-CHEK AVIVA PLUS) test strip Use to check blood sugar once daily. 09/11/20  Yes Ladell Pier, MD  insulin detemir (LEVEMIR) 100 UNIT/ML injection Inject 0.1 mLs (10 Units total) into the skin 2 (two) times daily. 12/14/20  Yes Ladell Pier, MD  pantoprazole (PROTONIX) 40 MG tablet Take 1 tablet (40 mg total) by mouth daily. 06/14/19  Yes Turner, Eber Hong, MD  Blood Glucose Monitoring Suppl (ACCU-CHEK GUIDE ME) w/Device KIT Use to check blood sugar once daily. 08/15/20   Ladell Pier, MD  cetirizine (ZYRTEC) 10 MG tablet Take 1 tablet (10 mg total) by mouth daily. 08/15/20   Ladell Pier, MD  SUMAtriptan (IMITREX) 50 MG tablet May repeat in 2 hours if headache persists or recurs. 10/16/20   Marcial Pacas, MD  topiramate (TOPAMAX) 100 MG tablet Take 1 tablet (100 mg total) by mouth 2 (two) times daily. Patient not taking: Reported on 01/29/2021 10/16/20   Marcial Pacas, MD    Current Outpatient Medications  Medication Sig Dispense Refill   Accu-Chek Softclix Lancets lancets Use to check blood sugar once daily. 100 each 2   atorvastatin (LIPITOR) 10 MG tablet Take 1 tablet (10 mg total) by mouth daily. 30 tablet 5   blood glucose meter kit and supplies Dispense based on patient and insurance preference. Use up to four times daily as directed. (FOR ICD-10 E10.9, E11.9). 1 each 0   Dulaglutide (TRULICITY) 1.5 WC/3.7SE SOPN Inject 1.5 mg into the skin once a week. 2 mL 2   fluticasone (FLONASE) 50 MCG/ACT nasal spray USE 1 SPRAY(S) IN EACH NOSTRIL ONCE DAILY AS NEEDED FOR ALLERGIES OR  RHINITIS 16 g 2   glucose blood (ACCU-CHEK AVIVA PLUS) test strip Use to check blood sugar once daily. 100 each 2   insulin detemir (LEVEMIR) 100 UNIT/ML injection Inject 0.1 mLs (10 Units total) into the skin 2 (two) times daily. 10 mL 2   pantoprazole (PROTONIX) 40 MG tablet Take 1 tablet (40 mg total) by mouth daily. 90 tablet 3   Blood Glucose Monitoring Suppl (ACCU-CHEK GUIDE ME)  w/Device KIT Use to check blood sugar once daily. 1 kit 0   cetirizine (ZYRTEC) 10 MG tablet Take 1 tablet (10 mg total) by mouth daily. 30 tablet 2   SUMAtriptan (IMITREX) 50 MG tablet May repeat in 2 hours if headache persists or recurs. 12 tablet 11   topiramate (TOPAMAX) 100 MG tablet Take 1 tablet (100 mg total) by mouth 2 (two) times daily. (Patient not taking: Reported on 01/29/2021) 180 tablet 4   Current Facility-Administered Medications  Medication Dose Route Frequency Provider Last Rate Last Admin   0.9 %  sodium chloride infusion  500 mL Intravenous Once Daryel November, MD        Allergies as of 01/29/2021 - Review Complete 01/29/2021  Allergen Reaction Noted   Doxycycline  05/07/2018    Family History  Problem Relation Age of Onset   Hypertension Mother    Diabetes Father  Breast cancer Maternal Aunt    Diabetes Maternal Aunt    Diabetes Maternal Aunt    Diabetes Maternal Aunt    Diabetes Paternal Aunt    Colon polyps Neg Hx    Colon cancer Neg Hx    Esophageal cancer Neg Hx    Stomach cancer Neg Hx    Rectal cancer Neg Hx     Social History   Socioeconomic History   Marital status: Single    Spouse name: Not on file   Number of children: Not on file   Years of education: Not on file   Highest education level: Not on file  Occupational History   Not on file  Tobacco Use   Smoking status: Former    Packs/day: 0.25    Years: 25.00    Pack years: 6.25    Types: Cigarettes    Quit date: 10/02/2019    Years since quitting: 1.3   Smokeless tobacco: Never  Vaping Use   Vaping Use: Never used  Substance and Sexual Activity   Alcohol use: Not Currently    Alcohol/week: 0.0 standard drinks    Comment: socially   Drug use: No   Sexual activity: Not Currently    Birth control/protection: None  Other Topics Concern   Not on file  Social History Narrative   Lives with daughter   Right Handed   Drinks 1 cup caffeine daily   Social Determinants of  Health   Financial Resource Strain: Not on file  Food Insecurity: Not on file  Transportation Needs: Not on file  Physical Activity: Not on file  Stress: Not on file  Social Connections: Not on file  Intimate Partner Violence: Not on file    Review of Systems:  All other review of systems negative except as mentioned in the HPI.  Physical Exam: Vital signs BP (!) 146/102   Pulse 70   Temp 97.6 F (36.4 C) (Temporal)   Resp 18   Ht '5\' 4"'  (1.626 m)   Wt 275 lb (124.7 kg)   LMP 11/26/2014 (Exact Date)   SpO2 98%   BMI 47.20 kg/m   General:   Alert,  Well-developed, well-nourished, pleasant and cooperative in NAD Airway:  Mallampati 2 Lungs:  Clear throughout to auscultation.   Heart:  Regular rate and rhythm; no murmurs, clicks, rubs,  or gallops. Abdomen:  Soft, nontender and nondistended. Normal bowel sounds.   Neuro/Psych:  Normal mood and affect. A and O x 3   Jeraldine Primeau E. Candis Schatz, MD [facility]

## 2021-01-29 NOTE — Progress Notes (Signed)
Report given to PACU, vss 

## 2021-01-29 NOTE — Progress Notes (Signed)
Called to room to assist during endoscopic procedure.  Patient ID and intended procedure confirmed with present staff. Received instructions for my participation in the procedure from the performing physician.  

## 2021-01-29 NOTE — Op Note (Signed)
[place] Patient Name: Rebecca Johnston Procedure Date: 01/29/2021 9:10 AM MRN: [id number] Endoscopist: Nicki Reaper E. Candis Schatz , MD Age: 50 Referring MD:  Date of Birth: 02/06/1971 Gender: Female Account #: [id number] Procedure:                Colonoscopy Indications:              Screening for colorectal malignant neoplasm, This                            is the patient's first colonoscopy Medicines:                Monitored Anesthesia Care Procedure:                Pre-Anesthesia Assessment:                           - Prior to the procedure, a History and Physical                            was performed, and patient medications and                            allergies were reviewed. The patient's tolerance of                            previous anesthesia was also reviewed. The risks                            and benefits of the procedure and the sedation                            options and risks were discussed with the patient.                            All questions were answered, and informed consent                            was obtained. Prior Anticoagulants: The patient has                            taken no previous anticoagulant or antiplatelet                            agents. ASA Grade Assessment: III - A patient with                            severe systemic disease. After reviewing the risks                            and benefits, the patient was deemed in                            satisfactory condition to undergo the procedure.  After obtaining informed consent, the colonoscope                            was passed under direct vision. Throughout the                            procedure, the patient's blood pressure, pulse, and                            oxygen saturations were monitored continuously. The                            CF [id number] #[id number] was introduced through the anus                            and advanced to  the the terminal ileum, with                            identification of the appendiceal orifice and IC                            valve. The colonoscopy was performed without                            difficulty. The patient tolerated the procedure                            well. The quality of the bowel preparation was                            adequate. The terminal ileum, ileocecal valve,                            appendiceal orifice, and rectum were photographed.                            The bowel preparation used was Miralax via split                            dose instruction. Scope In: 9:21:49 AM Scope Out: 9:42:19 AM Scope Withdrawal Time: 0 hours 16 minutes 29 seconds  Total Procedure Duration: 0 hours 20 minutes 30 seconds  Findings:                 The perianal and digital rectal examinations were                            normal. Pertinent negatives include normal                            sphincter tone and no palpable rectal lesions.                           A 5 mm polyp was found in the cecum. The polyp was  sessile. The polyp was removed with a cold snare.                            Resection and retrieval were complete. Estimated                            blood loss was minimal.                           A 4 mm polyp was found in the hepatic flexure. The                            polyp was sessile. The polyp was removed with a                            cold snare. Resection and retrieval were complete.                            Estimated blood loss was minimal.                           A 3 mm polyp was found in the splenic flexure. The                            polyp was sessile. The polyp was removed with a                            cold snare. Resection and retrieval were complete.                            Estimated blood loss was minimal.                           Multiple small and large-mouthed diverticula were                             found in the sigmoid colon and descending colon.                            There was no evidence of diverticular bleeding.                           The exam was otherwise normal throughout the                            examined colon.                           The terminal ileum appeared normal.                           The retroflexed view of the distal rectum and anal  verge was normal and showed no anal or rectal                            abnormalities. Complications:            No immediate complications. Estimated Blood Loss:     Estimated blood loss was minimal. Impression:               - One 5 mm polyp in the cecum, removed with a cold                            snare. Resected and retrieved.                           - One 4 mm polyp at the hepatic flexure, removed                            with a cold snare. Resected and retrieved.                           - One 3 mm polyp at the splenic flexure, removed                            with a cold snare. Resected and retrieved.                           - Moderate diverticulosis in the sigmoid colon and                            in the descending colon. There was no evidence of                            diverticular bleeding.                           - The examined portion of the ileum was normal.                           - The distal rectum and anal verge are normal on                            retroflexion view. Recommendation:           - Patient has a contact number available for                            emergencies. The signs and symptoms of potential                            delayed complications were discussed with the                            patient. Return to normal activities tomorrow.  Written discharge instructions were provided to the                            patient.                           - Resume previous diet.                            - Continue present medications.                           - Await pathology results.                           - Repeat colonoscopy (date not yet determined) for                            surveillance based on pathology results. [name], MD 01/29/2021 9:48:24 AM This report has been signed electronically.

## 2021-01-29 NOTE — Patient Instructions (Signed)
Handouts on diverticulosis and polyps given.  Await pathology results. Resume previous diet and continue present medications. Repeat colonoscopy for surveillance will be determined based off of pathology results.   YOU HAD AN ENDOSCOPIC PROCEDURE TODAY AT [facility]:   Refer to the procedure report that was given to you for any specific questions about what was found during the examination.  If the procedure report does not answer your questions, please call your gastroenterologist to clarify.  If you requested that your care partner not be given the details of your procedure findings, then the procedure report has been included in a sealed envelope for you to review at your convenience later.  YOU SHOULD EXPECT: Some feelings of bloating in the abdomen. Passage of more gas than usual.  Walking can help get rid of the air that was put into your GI tract during the procedure and reduce the bloating. If you had a lower endoscopy (such as a colonoscopy or flexible sigmoidoscopy) you may notice spotting of blood in your stool or on the toilet paper. If you underwent a bowel prep for your procedure, you may not have a normal bowel movement for a few days.  Please Note:  You might notice some irritation and congestion in your nose or some drainage.  This is from the oxygen used during your procedure.  There is no need for concern and it should clear up in a day or so.  SYMPTOMS TO REPORT IMMEDIATELY:  Following lower endoscopy (colonoscopy or flexible sigmoidoscopy):  Excessive amounts of blood in the stool  Significant tenderness or worsening of abdominal pains  Swelling of the abdomen that is new, acute  Fever of 100F or higher  For urgent or emergent issues, a gastroenterologist can be reached at any hour by calling [phone]. Do not use MyChart messaging for urgent concerns.    DIET:  We do recommend a small meal at first, but then you may proceed to your regular diet.   Drink plenty of fluids but you should avoid alcoholic beverages for 24 hours.  ACTIVITY:  You should plan to take it easy for the rest of today and you should NOT DRIVE or use heavy machinery until tomorrow (because of the sedation medicines used during the test).    FOLLOW UP: Our staff will call the number listed on your records 48-72 hours following your procedure to check on you and address any questions or concerns that you may have regarding the information given to you following your procedure. If we do not reach you, we will leave a message.  We will attempt to reach you two times.  During this call, we will ask if you have developed any symptoms of COVID 19. If you develop any symptoms (ie: fever, flu-like symptoms, shortness of breath, cough etc.) before then, please call [phone].  If you test positive for Covid 19 in the 2 weeks post procedure, please call and report this information to [place].    If any biopsies were taken you will be contacted by phone or by letter within the next 1-3 weeks.  Please call us at [phone] if you have not heard about the biopsies in 3 weeks.    SIGNATURES/CONFIDENTIALITY: You and/or your care partner have signed paperwork which will be entered into your electronic medical record.  These signatures attest to the fact that that the information above on your After Visit Summary has been reviewed and is understood.  Full responsibility of the  confidentiality of this discharge information lies with you and/or your care-partner.  

## 2021-01-31 ENCOUNTER — Telehealth: Payer: Self-pay | Admitting: *Deleted

## 2021-01-31 NOTE — Telephone Encounter (Signed)
  Follow up Call-  Call back number 01/29/2021  Post procedure Call Back phone  # [phone]  Permission to leave phone message Yes  Some recent data might be hidden     Patient questions:  Do you have a fever, pain , or abdominal swelling? No. Pain Score  0   Have you tolerated food without any problems? Yes.    Have you been able to return to your normal activities? Yes.    Do you have any questions about your discharge instructions: Diet   No. Medications  No. Follow up visit  No.  Do you have questions or concerns about your Care? No.  Actions: * If pain score is 4 or above: No action needed, pain <4.

## 2021-02-01 NOTE — Progress Notes (Signed)
Rebecca Johnston,   The three polyps that I removed during your recent procedure were completely benign but were proven to be "pre-cancerous" polyps that MAY have grown into cancers if they had not been removed.  Studies shows that at least 20% of women over age 51 and 30% of men over age 75 have pre-cancerous polyps.  Based on current nationally recognized surveillance guidelines, I recommend that you have a repeat colonoscopy in 5 years.   If you develop any new rectal bleeding, abdominal pain or significant bowel habit changes, please contact me before then.

## 2021-02-11 ENCOUNTER — Other Ambulatory Visit: Payer: Self-pay | Admitting: Internal Medicine

## 2021-02-11 DIAGNOSIS — Z1231 Encounter for screening mammogram for malignant neoplasm of breast: Secondary | ICD-10-CM

## 2021-03-12 ENCOUNTER — Ambulatory Visit
Admission: RE | Admit: 2021-03-12 | Discharge: 2021-03-12 | Disposition: A | Discharge status: Home / Self Care | Payer: Medicaid Other | Source: Ambulatory Visit | Attending: Internal Medicine | Admitting: Internal Medicine

## 2021-03-12 ENCOUNTER — Ambulatory Visit: Payer: Medicaid Other

## 2021-03-12 DIAGNOSIS — Z1231 Encounter for screening mammogram for malignant neoplasm of breast: Secondary | ICD-10-CM

## 2021-04-10 ENCOUNTER — Other Ambulatory Visit: Payer: Self-pay

## 2021-04-10 ENCOUNTER — Encounter: Payer: Self-pay | Admitting: Internal Medicine

## 2021-04-10 ENCOUNTER — Ambulatory Visit (INDEPENDENT_AMBULATORY_CARE_PROVIDER_SITE_OTHER): Payer: Medicaid Other | Admitting: Internal Medicine

## 2021-04-10 VITALS — BP 126/82 | HR 88 | Ht 64.0 in | Wt 278.0 lb

## 2021-04-10 DIAGNOSIS — E1122 Type 2 diabetes mellitus with diabetic chronic kidney disease: Secondary | ICD-10-CM

## 2021-04-10 DIAGNOSIS — E1165 Type 2 diabetes mellitus with hyperglycemia: Secondary | ICD-10-CM | POA: Diagnosis not present

## 2021-04-10 DIAGNOSIS — R739 Hyperglycemia, unspecified: Secondary | ICD-10-CM

## 2021-04-10 DIAGNOSIS — N1832 Chronic kidney disease, stage 3b: Secondary | ICD-10-CM

## 2021-04-10 DIAGNOSIS — Z794 Long term (current) use of insulin: Secondary | ICD-10-CM

## 2021-04-10 LAB — POCT GLYCOSYLATED HEMOGLOBIN (HGB A1C): Hemoglobin A1C: 8.2 % — AB (ref 4.0–5.6)

## 2021-04-10 LAB — POCT GLUCOSE (DEVICE FOR HOME USE): Glucose Fasting, POC: 210 mg/dL — AB (ref 70–99)

## 2021-04-10 NOTE — Patient Instructions (Addendum)
Trulicity 1.5 mg daiy  Levemir 10 units twice daily    HOW TO TREAT LOW BLOOD SUGARS (Blood sugar LESS THAN 70 MG/DL) Please follow the RULE OF 15 for the treatment of hypoglycemia treatment (when your (blood sugars are less than 70 mg/dL)   STEP 1: Take 15 grams of carbohydrates when your blood sugar is low, which includes:  3-4 GLUCOSE TABS  OR 3-4 OZ OF JUICE OR REGULAR SODA OR ONE TUBE OF GLUCOSE GEL    STEP 2: RECHECK blood sugar in 15 MINUTES STEP 3: If your blood sugar is still low at the 15 minute recheck --> then, go back to STEP 1 and treat AGAIN with another 15 grams of carbohydrates.

## 2021-04-10 NOTE — Progress Notes (Signed)
Name: Rebecca Johnston  MRN/ DOB: [id number], 1970-09-04   Age/ Sex: 51 y.o., female    PCP: Ladell Pier, MD   Reason for Endocrinology Evaluation: Type 2 Diabetes Mellitus     Date of Initial Endocrinology Visit: 04/10/2021     PATIENT IDENTIFIER: Rebecca Johnston is a 51 y.o. female with a past medical history of T2DM, Migraine headaches, T2DM and seizure disorcer . The patient presented for initial endocrinology clinic visit on 04/10/2021 for consultative assistance with her diabetes management.    HPI: Rebecca Johnston was    Diagnosed with DM 2013 Prior Medications tried/Intolerance: insulin started 08/2020.  Trulicity started 07/9739 Currently checking blood sugars 1-2 x / day Hypoglycemia episodes : no                Hemoglobin A1c has ranged from 6.8% in 2018, peaking at 14.9% in 2022. Patient has required hospitalization within the last 1 year from hyper or hypoglycemia:08/2020 with DKA  In terms of diet, the patient eats 2 meals, snacks on pop corn. Avoids sugar-sweetened beverages    HOME DIABETES REGIMEN: Trulicity 1.5 mg daiy  Levemir 10 units twice daily       Statin: yes ACE-I/ARB: no   METER DOWNLOAD SUMMARY: Did not bring   DIABETIC COMPLICATIONS: Microvascular complications:  CKD III  Denies: neuropathy,  Last eye exam: Completed 07/2020  Macrovascular complications:   Denies: CAD, PVD, CVA   PAST HISTORY: Past Medical History:  Past Medical History:  Diagnosis Date   Abnormal CT scan, chest 09/02/2013   CTa 04/18/13  1. Study limited by body habitus with no pulmonary embolism  detected.  2. Multiple borderline and a few mildly enlarged nonspecific  mediastinal and hilar lymph nodes. Multiple pulmonary nodules  bilaterally the largest measuring 8 mm. If the patient is at high  risk for bronchogenic carcinoma, follow-up chest CT at 3-58month is  recommended.  - 12/15/13  1. 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 ([facility] 07/27/2018   Anemia of chronic disease 06/17/2018   06/2018   Asthma    CKD (chronic kidney disease), stage III ([facility] 04/16/2017   Class 3 severe obesity due to excess calories with serious comorbidity and body mass index (BMI) of 40.0 to 44.9 in adult ([facility] [place] 06/15/2018   Diabetes mellitus without complication ([facility] 163/8453  Elevated blood pressure reading 05/09/2019   Fibroids    GERD (gastroesophageal reflux disease)    History of blood transfusion 04/2013   Hyperlipidemia    Hyperlipidemia associated with type 2 diabetes mellitus ([facility] 05/11/2019   Incarcerated hernia s/p lap repair w mesh 12/08/2014 12/08/2014   Neuropathy 04/04/2016   Obesity, Class III, BMI 40-49.9 (morbid obesity) ([facility]    Seasonal allergic rhinitis 12/21/2015   Seizure disorder ([facility] 05/14/2018   Shortness of breath dyspnea    Sleep apnea    Tobacco dependence 10/21/2013   Type II diabetes mellitus with renal manifestations ([facility] 12/21/2015   Vitamin D deficiency 06/15/2018   Past Surgical History:  Past Surgical History:  Procedure Laterality Date   ABDOMINAL HYSTERECTOMY N/A 12/08/2014   Procedure: HYSTERECTOMY ABDOMINAL;  Surgeon: JWoodroe Mode MD;  Location: [facility];  Service: Gynecology;  Laterality: N/A;   DILATION AND CURETTAGE OF UTERUS     DILITATION & CURRETTAGE/HYSTROSCOPY WITH VERSAPOINT RESECTION N/A 01/06/2014   Procedure: HCorena PilgrimWITH VERSAPOINT;  Surgeon: JWoodroe Mode MD;  [facility];  Service: Gynecology;  Laterality: N/A;   LAPAROSCOPIC LYSIS OF ADHESIONS N/A 12/08/2014   Procedure: LAPAROSCOPIC LYSIS OF ADHESIONS;  Surgeon: Michael Boston, MD;  Location: [facility];  Service: General;  Laterality: N/A;   OVARIAN CYST REMOVAL     cyst not removed. Cyst drained   SUPRA-UMBILICAL HERNIA N/A 31/07/1759   Procedure: LAPARSCOPIC SUPRA-UMBILICAL HERNIA REPAIR ;  Surgeon: Michael Boston, MD;  Location: [facility];  Service: General;  Laterality: N/A;   VENTRAL HERNIA REPAIR  N/A 12/08/2014   Procedure: LAPAROSCOPIC INCARCERATED INCISIONAL VENTRAL WALL HERNIA REPAIR;  Surgeon: Michael Boston, MD;  Location: [facility];  Service: General;  Laterality: N/A;    Social History:  reports that she quit smoking about 18 months ago. Her smoking use included cigarettes. She has a 6.25 pack-year smoking history. She has never used smokeless tobacco. She reports that she does not currently use alcohol. She reports that she does not use drugs. Family History:  Family History  Problem Relation Age of Onset   Hypertension Mother    Diabetes Father    Breast cancer Maternal Aunt    Diabetes Maternal Aunt    Diabetes Maternal Aunt    Diabetes Maternal Aunt    Diabetes Paternal Aunt    Colon polyps Neg Hx    Colon cancer Neg Hx    Esophageal cancer Neg Hx    Stomach cancer Neg Hx    Rectal cancer Neg Hx      HOME MEDICATIONS: Allergies as of 04/10/2021       Reactions   Doxycycline    "bad dreaming, hallucination, dizziness" per pt        Medication List        Accurate as of April 10, 2021  3:16 PM. If you have any questions, ask your nurse or doctor.          Accu-Chek Aviva Plus test strip Generic drug: glucose blood Use to check blood sugar once daily.   Accu-Chek Guide Me w/Device Kit Use to check blood sugar once daily.   Accu-Chek Softclix Lancets lancets Use to check blood sugar once daily.   atorvastatin 10 MG tablet Commonly known as: LIPITOR Take 1 tablet (10 mg total) by mouth daily.   blood glucose meter kit and supplies Dispense based on patient and insurance preference. Use up to four times daily as directed. (FOR ICD-10 E10.9, E11.9).   cetirizine 10 MG tablet Commonly known as: ZYRTEC Take 1 tablet (10 mg total) by mouth daily.   fluticasone 50 MCG/ACT nasal spray Commonly known as: FLONASE USE 1 SPRAY(S) IN EACH NOSTRIL ONCE DAILY AS NEEDED FOR ALLERGIES OR  RHINITIS   insulin detemir 100 UNIT/ML injection Commonly known as:  LEVEMIR Inject 0.1 mLs (10 Units total) into the skin 2 (two) times daily.   pantoprazole 40 MG tablet Commonly known as: PROTONIX Take 1 tablet (40 mg total) by mouth daily.   SUMAtriptan 50 MG tablet Commonly known as: Imitrex May repeat in 2 hours if headache persists or recurs.   topiramate 100 MG tablet Commonly known as: TOPAMAX Take 1 tablet (100 mg total) by mouth 2 (two) times daily.   Trulicity 1.5 YW/7.3XT Sopn Generic drug: Dulaglutide Inject 1.5 mg into the skin once a week.         ALLERGIES: Allergies  Allergen Reactions   Doxycycline     "bad dreaming, hallucination, dizziness" per pt     REVIEW OF SYSTEMS: A comprehensive ROS was conducted with  the patient and is negative except as per HPI and below:  ROS    OBJECTIVE:   VITAL SIGNS: LMP 11/26/2014 (Exact Date)    PHYSICAL EXAM:  General: Pt appears well and is in NAD  Hydration: Well-hydrated with moist mucous membranes and good skin turgor  HEENT: Head: Unremarkable with good dentition. Oropharynx clear without exudate.  Eyes: External eye exam normal without stare, lid lag or exophthalmos.  EOM intact.  PERRL.  Neck: General: Supple without adenopathy or carotid bruits. Thyroid: Thyroid size normal.  No goiter or nodules appreciated. No thyroid bruit.  Lungs: Clear with good BS bilat with no rales, rhonchi, or wheezes  Heart: RRR with normal S1 and S2 and no gallops; no murmurs; no rub  Abdomen: Normoactive bowel sounds, soft, nontender, without masses or organomegaly palpable  Extremities:  Lower extremities - No pretibial edema. No lesions.  Skin: Normal texture and temperature to palpation. No rash noted. No Acanthosis nigricans/skin tags. No lipohypertrophy.  Neuro: MS is good with appropriate affect, pt is alert and Ox3    DM foot exam:    DATA REVIEWED:  Lab Results  Component Value Date   HGBA1C 8.0 (H) 11/30/2020   HGBA1C 14.9 (H) 09/27/2020   HGBA1C 5.7 05/09/2019   Lab  Results  Component Value Date   LDLCALC 119 (H) 05/09/2019   CREATININE 1.58 (H) 09/28/2020   Lab Results  Component Value Date   MICRALBCREAT 6 05/09/2019    Lab Results  Component Value Date   CHOL 189 05/09/2019   HDL 47 05/09/2019   LDLCALC 119 (H) 05/09/2019   TRIG 131 05/09/2019   CHOLHDL 4.0 05/09/2019      In office BG 210 mg/dL   ASSESSMENT / PLAN / RECOMMENDATIONS:   1) Type 2 Diabetes Mellitus, poorly controlled, With CKD III complications - Most recent A1c of 8.2%. Goal A1c <7.0%.    Plan: GENERAL: I have discussed with the patient the pathophysiology of diabetes. We went over the natural progression of the disease. We talked about both insulin resistance and insulin deficiency. We stressed the importance of lifestyle changes including diet and exercise. I explained the complications associated with diabetes including retinopathy, nephropathy, neuropathy as well as increased risk of cardiovascular disease. We went over the benefit seen with glycemic control.  Patient is not a candidate for SGLT2 inhibitors due to history of DKA The patient did not bring a glucose meter on today's visit Her postprandial BG was 210 mg/DL She could not remember what her morning BG was 4 this morning She is supposed to making any glycemic changes at this time, I have recommended increasing her Trulicity but declined stating that another provider has adjusted her medication not long time ago, she was not sure about the name but in review of her chart I am going to assume this was [facility] which [facility] 12[facility]14/2020 Patient is under the impression she is getting her glucose under control, I did explain that the A1c goal needs to be < 7% to prevent microvascular complications Patient would like to revisit in 3 months    MEDICATIONS: Continue Levemir 10 units twice daily Continue Trulicity 1.5 Mg weekly  EDUCATION /  INSTRUCTIONS: BG monitoring instructions: Patient is instructed to check her blood sugars 1 times a day, fasting. Call [facility] if: BG persistently < 70  I reviewed the Rule of 15 for the treatment of hypoglycemia in detail  with the patient. Literature supplied.   2) Diabetic complications:  Eye: Does not have known diabetic retinopathy.  Neuro/ Feet: Does not have known diabetic peripheral neuropathy. Renal: Patient does  have known baseline CKD. She is not on an ACEI/ARB at present.         Signed electronically by: Mack Guise, MD  [facility]  [facility] [address], [place], [place] [address] Phone: [phone] FAX: [phone]   CC: Ladell Pier, MD [place] [address] Phone: [phone]  Fax: [phone]    Return to Endocrinology clinic as below: Future Appointments  Date Time Provider [place]  04/10/2021  3:20 PM Sabian Kuba, Melanie Crazier, MD [facility] None

## 2021-04-11 DIAGNOSIS — Z794 Long term (current) use of insulin: Secondary | ICD-10-CM | POA: Insufficient documentation

## 2021-04-11 DIAGNOSIS — E1165 Type 2 diabetes mellitus with hyperglycemia: Secondary | ICD-10-CM | POA: Insufficient documentation

## 2021-04-11 DIAGNOSIS — E1122 Type 2 diabetes mellitus with diabetic chronic kidney disease: Secondary | ICD-10-CM | POA: Insufficient documentation

## 2021-05-02 DIAGNOSIS — Z862 Personal history of diseases of the blood and blood-forming organs and certain disorders involving the immune mechanism: Secondary | ICD-10-CM | POA: Diagnosis not present

## 2021-05-02 DIAGNOSIS — Z6841 Body Mass Index (BMI) 40.0 and over, adult: Secondary | ICD-10-CM | POA: Diagnosis not present

## 2021-05-02 DIAGNOSIS — G40909 Epilepsy, unspecified, not intractable, without status epilepticus: Secondary | ICD-10-CM | POA: Diagnosis not present

## 2021-05-02 DIAGNOSIS — E1122 Type 2 diabetes mellitus with diabetic chronic kidney disease: Secondary | ICD-10-CM | POA: Diagnosis not present

## 2021-05-02 DIAGNOSIS — E1129 Type 2 diabetes mellitus with other diabetic kidney complication: Secondary | ICD-10-CM | POA: Diagnosis not present

## 2021-05-02 DIAGNOSIS — N183 Chronic kidney disease, stage 3 unspecified: Secondary | ICD-10-CM | POA: Diagnosis not present

## 2021-05-08 ENCOUNTER — Encounter: Payer: Self-pay | Admitting: Internal Medicine

## 2021-05-08 NOTE — Progress Notes (Signed)
Note received from patient's nephrologist Dr. Johnney Ou.  Patient seen 05/02/2021. ?Assessment is CKD stage III: Very slowly progressive CKD.  BP normal and no history of proteinuria so not currently on RAAS inhibitor or SGLT2i ?Labs: Creatinine 1.99/GFR 30 ?Urine microalbumin/creatinine ratio 155 ?Hemoglobin 13.8 ?Ferritin 81/iron 53/iron saturation 16%/TIBC 327. ? ? ?

## 2021-07-09 ENCOUNTER — Telehealth: Payer: Self-pay | Admitting: Internal Medicine

## 2021-09-23 ENCOUNTER — Other Ambulatory Visit: Payer: Self-pay | Admitting: Cardiology

## 2021-09-24 MED ORDER — PANTOPRAZOLE SODIUM 40 MG PO TBEC: 40.0000 mg | DELAYED_RELEASE_TABLET | Freq: Every day | ORAL | 1 refills | Status: DC

## 2021-09-24 MED ORDER — ACCU-CHEK AVIVA PLUS VI STRP: ORAL_STRIP | 2 refills | Status: DC

## 2021-09-24 NOTE — Telephone Encounter (Signed)
Pt called asking why her test strips and pantoprazole has not been sent to the pharmacy.  She said she has contacted the pharmacy and they called her back saying they requested these and have was told Dr. Wynetta Emery would not authorize..  She uses [facility].  CB#  [phone]

## 2021-09-24 NOTE — Addendum Note (Signed)
Addended by: Karle Plumber B on: 09/24/2021 01:45 PM   Modules accepted: Orders

## 2021-09-26 NOTE — Telephone Encounter (Signed)
Pt has been informed of medications being sent to pharmacy.

## 2021-10-11 ENCOUNTER — Other Ambulatory Visit: Payer: Self-pay | Admitting: Internal Medicine

## 2021-10-11 NOTE — Telephone Encounter (Signed)
Medication Refill - Medication: Dulaglutide (TRULICITY) 1.5 DC/3.0DT SOPN  Has the patient contacted their pharmacy? Yes.    (Agent: If yes, when and what did the pharmacy advise?) no response from provider  Preferred Pharmacy (with phone number or street name):   [facility] [place], [place] Phone:  [phone]  Fax:  [phone]      Has the patient been seen for an appointment in the last year OR does the patient have an upcoming appointment? No.  Agent: Please be advised that RX refills may take up to 3 business days. We ask that you follow-up with your pharmacy.

## 2021-10-11 NOTE — Telephone Encounter (Signed)
Requested medication (s) are due for refill today: yes  Requested medication (s) are on the active medication list:yes  Last refill:  07/10/21  Future visit scheduled: no  Notes to clinic:  Unable to refill per protocol, appointment needed.      Requested Prescriptions  Pending Prescriptions Disp Refills   Dulaglutide (TRULICITY) 1.5 FK/8.1EX SOPN 2 mL 0     Endocrinology:  Diabetes - GLP-1 Receptor Agonists Failed - 10/11/2021 10:45 AM      Failed - HBA1C is between 0 and 7.9 and within 180 days    Hemoglobin A1C  Date Value Ref Range Status  04/10/2021 8.2 (A) 4.0 - 5.6 % Final   HbA1c, POC (prediabetic range)  Date Value Ref Range Status  05/09/2019 5.7 5.7 - 6.4 % Final   Hgb A1c MFr Bld  Date Value Ref Range Status  11/30/2020 8.0 (H) 4.6 - 6.5 % Final    Comment:    Glycemic Control Guidelines for People with Diabetes:Non Diabetic:  <6%Goal of Therapy: <7%Additional Action Suggested:  >8%          Failed - Valid encounter within last 6 months    Recent Outpatient Visits           9 months ago Type 2 diabetes mellitus with stage 3 chronic kidney disease, with long-term current use of insulin, unspecified whether stage 3a or 3b CKD ([place])   [place], Neoma Laming B, MD   10 months ago Type 2 diabetes mellitus with stage 3 chronic kidney disease, with long-term current use of insulin, unspecified whether stage 3a or 3b CKD ([place])   [place] Ausdall, Annie Main L, RPH-CPP   10 months ago Type 2 diabetes mellitus with stage 3 chronic kidney disease, with long-term current use of insulin, unspecified whether stage 3a or 3b CKD [facility])   [place] Ausdall, Stephen L, RPH-CPP   1 year ago Hospital discharge follow-up   [place] Ladell Pier, MD   1 year ago Annual physical exam   [facility] Ladell Pier, MD

## 2021-10-16 ENCOUNTER — Encounter: Payer: Self-pay | Admitting: Internal Medicine

## 2021-10-16 ENCOUNTER — Ambulatory Visit (INDEPENDENT_AMBULATORY_CARE_PROVIDER_SITE_OTHER): Payer: Medicaid Other | Admitting: Internal Medicine

## 2021-10-16 VITALS — BP 124/76 | HR 102 | Ht 64.0 in | Wt 254.0 lb

## 2021-10-16 DIAGNOSIS — Z794 Long term (current) use of insulin: Secondary | ICD-10-CM

## 2021-10-16 DIAGNOSIS — E1165 Type 2 diabetes mellitus with hyperglycemia: Secondary | ICD-10-CM

## 2021-10-16 DIAGNOSIS — E1122 Type 2 diabetes mellitus with diabetic chronic kidney disease: Secondary | ICD-10-CM

## 2021-10-16 DIAGNOSIS — N183 Chronic kidney disease, stage 3 unspecified: Secondary | ICD-10-CM

## 2021-10-16 LAB — POCT GLUCOSE (DEVICE FOR HOME USE): POC Glucose: >600 mg/dl (ref 70–99)

## 2021-10-16 LAB — POCT GLYCOSYLATED HEMOGLOBIN (HGB A1C): HbA1c POC (<> result, manual entry): >15 % (ref 4.0–5.6)

## 2021-10-16 MED ORDER — INSULIN PEN NEEDLE 32G X 4 MM MISC: 1.0000 | Freq: Four times a day (QID) | 1 refills | Status: DC

## 2021-10-16 MED ORDER — TRULICITY 3 MG/0.5ML ~~LOC~~ SOAJ: 3.0000 mg | SUBCUTANEOUS | 2 refills | Status: DC

## 2021-10-16 MED ORDER — DEXCOM G7 SENSOR MISC: 1.0000 | 3 refills | Status: DC

## 2021-10-16 MED ORDER — LEVEMIR FLEXPEN 100 UNIT/ML ~~LOC~~ SOPN: 20.0000 [IU] | PEN_INJECTOR | Freq: Two times a day (BID) | SUBCUTANEOUS | 2 refills | Status: DC

## 2021-10-16 NOTE — Progress Notes (Signed)
Name: Rebecca Johnston  MRN/ DOB: [id number], [birth year]   Age/ Sex: 51 y.o., female    PCP: Ladell Pier, MD   Reason for Endocrinology Evaluation: Type 2 Diabetes Mellitus     Date of Initial Endocrinology Visit: 04/10/2021    PATIENT IDENTIFIER: Rebecca Johnston is a 51 y.o. female with a past medical history of T2DM, Migraine headaches, T2DM and seizure disorcer . The patient presented for initial endocrinology clinic visit on 04/10/2021 for consultative assistance with her diabetes management.    HPI: Rebecca Johnston was    Diagnosed with DM 2013 Prior Medications tried/Intolerance: insulin started 08/2020.  Trulicity started 07/6859             Hemoglobin A1c has ranged from 6.8% in 2018, peaking at 14.9% in 2022. Patient has required hospitalization within the last 1 year from hyper or hypoglycemia:08/2020 with DKA  On her initial visit to our clinic she had an A1c 8.2%, she was on levemir and Trulicity . She declined increasing Trulicity on her visit with me     SUBJECTIVE:   During the last visit (04/10/2021): A1c 8.2 % She declined increasing trulicity      Today (68/37/29): Rebecca Johnston is here for a follow up on diabetes management. She checks her blood sugars occasionally . The patient has not had hypoglycemic episodes since the last clinic visit  She has been off trulicity for 2 weeks as she ran out   Denies nausea or vomiting but feels weak  Has noted polyuria and polydipsia      HOME DIABETES REGIMEN: Trulicity 1.5 mg daiy  Levemir 14 units twice daily       Statin: yes ACE-I/ARB: no   METER DOWNLOAD SUMMARY: Did not bring   DIABETIC COMPLICATIONS: Microvascular complications:  CKD III  Denies: neuropathy,  Last eye exam: Completed 07/2020  Macrovascular complications:   Denies: CAD, PVD, CVA   PAST HISTORY: Past Medical History:  Past Medical History:  Diagnosis Date   Abnormal CT scan, chest 09/02/2013   CTa 04/18/13  1. Study  limited by body habitus with no pulmonary embolism  detected.  2. Multiple borderline and a few mildly enlarged nonspecific  mediastinal and hilar lymph nodes. Multiple pulmonary nodules  bilaterally the largest measuring 8 mm. If the patient is at high  risk for bronchogenic carcinoma, follow-up chest CT at 3-3month is  recommended.  - 12/15/13  1. Interval resolution of th   Anemia    Anemia due to stage 3 chronic kidney disease ([place] 07/27/2018   Anemia of chronic disease 06/17/2018   06/2018   Asthma    CKD (chronic kidney disease), stage III ([place] 04/16/2017   Class 3 severe obesity due to excess calories with serious comorbidity and body mass index (BMI) of 40.0 to 44.9 in adult ([facility] 06/15/2018   Diabetes mellitus without complication ([facility] 102/1115  Elevated blood pressure reading 05/09/2019   Fibroids    GERD (gastroesophageal reflux disease)    History of blood transfusion 04/2013   Hyperlipidemia    Hyperlipidemia associated with type 2 diabetes mellitus ([facility] 05/11/2019   Incarcerated hernia s/p lap repair w mesh 12/08/2014 12/08/2014   Neuropathy 04/04/2016   Obesity, Class III, BMI 40-49.9 (morbid obesity) ([facility]    Seasonal allergic rhinitis 12/21/2015   Seizure disorder ([place] 05/14/2018   Shortness of breath dyspnea    Sleep apnea    Tobacco dependence 10/21/2013   Type II diabetes mellitus with renal manifestations (  [place]) 12/21/2015   Vitamin D deficiency 06/15/2018   Past Surgical History:  Past Surgical History:  Procedure Laterality Date   ABDOMINAL HYSTERECTOMY N/A 12/08/2014   Procedure: HYSTERECTOMY ABDOMINAL;  Surgeon: Woodroe Mode, MD;  Location: [facility];  Service: Gynecology;  Laterality: N/A;   DILATION AND CURETTAGE OF UTERUS     DILITATION & CURRETTAGE/HYSTROSCOPY WITH VERSAPOINT RESECTION N/A 01/06/2014   Procedure: Corena Pilgrim WITH VERSAPOINT;  Surgeon: Woodroe Mode, MD;  Location: [place] [facility];  Service: Gynecology;  Laterality: N/A;   LAPAROSCOPIC LYSIS OF  ADHESIONS N/A 12/08/2014   Procedure: LAPAROSCOPIC LYSIS OF ADHESIONS;  Surgeon: Michael Boston, MD;  Location: [facility];  Service: General;  Laterality: N/A;   OVARIAN CYST REMOVAL     cyst not removed. Cyst drained   SUPRA-UMBILICAL HERNIA N/A 96/09/5914   Procedure: LAPARSCOPIC SUPRA-UMBILICAL HERNIA REPAIR ;  Surgeon: Michael Boston, MD;  Location: [facility];  Service: General;  Laterality: N/A;   VENTRAL HERNIA REPAIR N/A 12/08/2014   Procedure: LAPAROSCOPIC INCARCERATED INCISIONAL VENTRAL WALL HERNIA REPAIR;  Surgeon: Michael Boston, MD;  Location: [facility];  Service: General;  Laterality: N/A;    Social History:  reports that she quit smoking about 2 years ago. Her smoking use included cigarettes. She has a 6.25 pack-year smoking history. She has never used smokeless tobacco. She reports that she does not currently use alcohol. She reports that she does not use drugs. Family History:  Family History  Problem Relation Age of Onset   Hypertension Mother    Diabetes Father    Breast cancer Maternal Aunt    Diabetes Maternal Aunt    Diabetes Maternal Aunt    Diabetes Maternal Aunt    Diabetes Paternal Aunt    Colon polyps Neg Hx    Colon cancer Neg Hx    Esophageal cancer Neg Hx    Stomach cancer Neg Hx    Rectal cancer Neg Hx      HOME MEDICATIONS: Allergies as of 10/16/2021       Reactions   Doxycycline    "bad dreaming, hallucination, dizziness" per pt        Medication List        Accurate as of October 16, 2021  5:43 PM. If you have any questions, ask your nurse or doctor.          STOP taking these medications    insulin detemir 100 UNIT/ML injection Commonly known as: LEVEMIR Replaced by: Levemir FlexPen 100 UNIT/ML FlexPen Stopped by: Dorita Sciara, MD   topiramate 100 MG tablet Commonly known as: TOPAMAX Stopped by: Dorita Sciara, MD   Trulicity 1.5 BW/4.6KZ Sopn Generic drug: Dulaglutide Replaced by: Trulicity 3 LD/3.5TS Sopn Stopped by: Dorita Sciara, MD       TAKE these medications    Accu-Chek Aviva Plus test strip Generic drug: glucose blood Use to check blood sugar once daily.   Accu-Chek Softclix Lancets lancets Use to check blood sugar once daily.   atorvastatin 10 MG tablet Commonly known as: LIPITOR Take 1 tablet (10 mg total) by mouth daily.   blood glucose meter kit and supplies Dispense based on patient and insurance preference. Use up to four times daily as directed. (FOR ICD-10 E10.9, E11.9).   cetirizine 10 MG tablet Commonly known as: ZYRTEC Take 1 tablet (10 mg total) by mouth daily.   Dexcom G7 Sensor Misc 1 Device by Does not apply route as directed. Started by: Dorita Sciara, MD  fluticasone 50 MCG/ACT nasal spray Commonly known as: FLONASE USE 1 SPRAY(S) IN EACH NOSTRIL ONCE DAILY AS NEEDED FOR ALLERGIES OR  RHINITIS   Insulin Pen Needle 32G X 4 MM Misc 1 Device by Does not apply route in the morning, at noon, in the evening, and at bedtime. Started by: Dorita Sciara, MD   Levemir FlexPen 100 UNIT/ML FlexPen Generic drug: insulin detemir Inject 20 Units into the skin 2 (two) times daily. Replaces: insulin detemir 100 UNIT/ML injection Started by: Dorita Sciara, MD   pantoprazole 40 MG tablet Commonly known as: PROTONIX Take 1 tablet (40 mg total) by mouth daily.   SUMAtriptan 50 MG tablet Commonly known as: Imitrex May repeat in 2 hours if headache persists or recurs.   Trulicity 3 XV/4.0GQ Sopn Generic drug: Dulaglutide Inject 3 mg as directed once a week. Replaces: Trulicity 1.5 QP/6.1PJ Sopn Started by: Dorita Sciara, MD         ALLERGIES: Allergies  Allergen Reactions   Doxycycline     "bad dreaming, hallucination, dizziness" per pt     REVIEW OF SYSTEMS: A comprehensive ROS was conducted with the patient and is negative except as per HPI and below:  ROS    OBJECTIVE:   VITAL SIGNS: BP 124/76 (BP Location: Left Arm,  Patient Position: Sitting, Cuff Size: Large)   Pulse (!) 102   Ht '5\' 4"'  (1.626 m)   Wt 254 lb (115.2 kg)   LMP 11/26/2014 Comment: negative preg test   SpO2 96%   BMI 43.60 kg/m    PHYSICAL EXAM:  General: Pt appears well and is in NAD  Hydration: Well-hydrated with moist mucous membranes and good skin turgor  HEENT: Head: Unremarkable with good dentition. Oropharynx clear without exudate.  Eyes: External eye exam normal without stare, lid lag or exophthalmos.  EOM intact.  PERRL.  Neck: General: Supple without adenopathy or carotid bruits. Thyroid: Thyroid size normal.  No goiter or nodules appreciated. No thyroid bruit.  Lungs: Clear with good BS bilat with no rales, rhonchi, or wheezes  Heart: RRR with normal S1 and S2 and no gallops; no murmurs; no rub  Abdomen: Normoactive bowel sounds, soft, nontender, without masses or organomegaly palpable  Extremities:  Lower extremities - No pretibial edema. No lesions.  Neuro: MS is good with appropriate affect, pt is alert and Ox3     DATA REVIEWED:  Lab Results  Component Value Date   HGBA1C >15.0 10/16/2021   HGBA1C 8.2 (A) 04/10/2021   HGBA1C 8.0 (H) 11/30/2020   Lab Results  Component Value Date   LDLCALC 119 (H) 05/09/2019   CREATININE 1.58 (H) 09/28/2020   Lab Results  Component Value Date   MICRALBCREAT 6 05/09/2019    Lab Results  Component Value Date   CHOL 189 05/09/2019   HDL 47 05/09/2019   LDLCALC 119 (H) 05/09/2019   TRIG 131 05/09/2019   CHOLHDL 4.0 05/09/2019      In office BG 210 mg/dL   ASSESSMENT / PLAN / RECOMMENDATIONS:   1) Type 2 Diabetes Mellitus, poorly controlled, With CKD III complications - Most recent A1c of >15.0%. Goal A1c <7.0%.    -Patient with worsening glycemic control and severe hyperglycemia at this time, however in office BG was high , indicating BG reading >500 mg/dL.  Patient is asymptomatic at this time, she was advised to report to the ED should she start having nausea,  vomiting, or confusion -She was provided with a sample pen  of Fiasp to use based on correction scale -We will increase her Levemir and Trulicity as below - Patient is not a candidate for SGLT2 inhibitors due to history of DKA -A prescription of Dexcom was faxed to her pharmacy and she was provided with a sample of Dexcom G7   MEDICATIONS: Increase Levemir 20 units twice daily Increase Trulicity 3 Mg weekly Correction factor : Fiasp (BG -130/30)  EDUCATION / INSTRUCTIONS: BG monitoring instructions: Patient is instructed to check her blood sugars 1 times a day, fasting. Call [facility] if: BG persistently < 70  I reviewed the Rule of 15 for the treatment of hypoglycemia in detail with the patient. Literature supplied.   2) Diabetic complications:  Eye: Does not have known diabetic retinopathy.  Neuro/ Feet: Does not have known diabetic peripheral neuropathy. Renal: Patient does  have known baseline CKD. She is not on an ACEI/ARB at present.    Follow-up in 3  Signed electronically by: Mack Guise, MD  [facility] Endocrinology  [facility] [address] [place], [place] [address] Phone: [phone] FAX: [phone]   CC: Ladell Pier, MD [address] [place] [address] Phone: [phone]  Fax: [phone]    Return to Endocrinology clinic as below: Future Appointments  Date Time Provider [place]  03/11/2022  2:40 PM Sonni Barse, Melanie Crazier, MD [facility] None

## 2021-10-16 NOTE — Patient Instructions (Addendum)
Increase Trulicity 3 mg weekly  Increase Levemir 20 units twice daily  FIASP correctional insulin: Use the scale below to help guide you before each meal. Separate doses by 3 hours   Blood sugar before meal Number of units to inject  Less than 160 0 unit  161 -  190 1 units  191 -  220 2 units  221 -  250 3 units  251 -  280 4 units  281 -  310 5 units  311 -  340 6 units  341 -  370 7 units  371 -  400 8 units  401 - 430 9 units      HOW TO TREAT LOW BLOOD SUGARS (Blood sugar LESS THAN 70 MG/DL) Please follow the RULE OF 15 for the treatment of hypoglycemia treatment (when your (blood sugars are less than 70 mg/dL)   STEP 1: Take 15 grams of carbohydrates when your blood sugar is low, which includes:  3-4 GLUCOSE TABS  OR 3-4 OZ OF JUICE OR REGULAR SODA OR ONE TUBE OF GLUCOSE GEL    STEP 2: RECHECK blood sugar in 15 MINUTES STEP 3: If your blood sugar is still low at the 15 minute recheck --> then, go back to STEP 1 and treat AGAIN with another 15 grams of carbohydrates.

## 2021-10-23 ENCOUNTER — Telehealth: Payer: Self-pay

## 2021-10-23 ENCOUNTER — Other Ambulatory Visit (HOSPITAL_COMMUNITY): Payer: Self-pay

## 2021-10-23 NOTE — Telephone Encounter (Signed)
Patient Advocate Encounter   Received notification from [employer] that prior authorization is required for [facility] G7 Sensor. PA submitted and APPROVED on 10/23/2021.  Key [id number]  Effective: 10/23/2021 - 04/21/2022  Georgia Duff [employer] Patient Advocate Specialist Phone: [phone]

## 2021-11-06 DIAGNOSIS — G4733 Obstructive sleep apnea (adult) (pediatric): Secondary | ICD-10-CM | POA: Diagnosis not present

## 2021-11-06 DIAGNOSIS — E1129 Type 2 diabetes mellitus with other diabetic kidney complication: Secondary | ICD-10-CM | POA: Diagnosis not present

## 2021-11-06 DIAGNOSIS — E1122 Type 2 diabetes mellitus with diabetic chronic kidney disease: Secondary | ICD-10-CM | POA: Diagnosis not present

## 2021-11-06 DIAGNOSIS — G40909 Epilepsy, unspecified, not intractable, without status epilepticus: Secondary | ICD-10-CM | POA: Diagnosis not present

## 2021-11-06 DIAGNOSIS — Z6841 Body Mass Index (BMI) 40.0 and over, adult: Secondary | ICD-10-CM | POA: Diagnosis not present

## 2021-11-06 DIAGNOSIS — N183 Chronic kidney disease, stage 3 unspecified: Secondary | ICD-10-CM | POA: Diagnosis not present

## 2021-11-06 DIAGNOSIS — Z862 Personal history of diseases of the blood and blood-forming organs and certain disorders involving the immune mechanism: Secondary | ICD-10-CM | POA: Diagnosis not present

## 2022-03-11 ENCOUNTER — Ambulatory Visit: Payer: Medicaid Other | Admitting: Internal Medicine

## 2022-03-11 NOTE — Progress Notes (Deleted)
Name: Rebecca Johnston  MRN/ DOB: [id number], 01-20-1971   Age/ Sex: 52 y.o., female    PCP: Ladell Pier, MD   Reason for Endocrinology Evaluation: Type 2 Diabetes Mellitus     Date of Initial Endocrinology Visit: 04/10/2021    PATIENT IDENTIFIER: Rebecca Johnston is a 52 y.o. female with a past medical history of T2DM, Migraine headaches, T2DM and seizure disorcer . The patient presented for initial endocrinology clinic visit on 04/10/2021 for consultative assistance with her diabetes management.    HPI: Ms. Gaultney was    Diagnosed with DM 2013 Prior Medications tried/Intolerance: insulin started 08/2020.  Trulicity started 10/4694             Hemoglobin A1c has ranged from 6.8% in 2018, peaking at 14.9% in 2022. Patient has required hospitalization within the last 1 year from hyper or hypoglycemia:08/2020 with DKA  On her initial visit to our clinic she had an A1c 8.2%, she was on levemir and Trulicity . She declined increasing Trulicity on her visit with me    She was approved for dexcom 10/2021 SUBJECTIVE:   During the last visit (10/16/2021): A1c >15.0 %     Today (03/11/22): Ms. Marmo is here for a follow up on diabetes management. She checks her blood sugars occasionally . The patient has not had hypoglycemic episodes since the last clinic visit  She has been off trulicity for 2 weeks as she ran out   Denies nausea or vomiting but feels weak  Has noted polyuria and polydipsia      HOME DIABETES REGIMEN: Trulicity 3 mg daiy  Levemir 20 units twice daily  Correction factor: Fiasp (BG-130/30)      Statin: yes ACE-I/ARB: no   METER DOWNLOAD SUMMARY: Did not bring   DIABETIC COMPLICATIONS: Microvascular complications:  CKD III  Denies: neuropathy,  Last eye exam: Completed 07/2020  Macrovascular complications:   Denies: CAD, PVD, CVA   PAST HISTORY: Past Medical History:  Past Medical History:  Diagnosis Date   Abnormal CT scan, chest  09/02/2013   CTa 04/18/13  1. Study limited by body habitus with no pulmonary embolism  detected.  2. Multiple borderline and a few mildly enlarged nonspecific  mediastinal and hilar lymph nodes. Multiple pulmonary nodules  bilaterally the largest measuring 8 mm. If the patient is at high  risk for bronchogenic carcinoma, follow-up chest CT at 3-30month is  recommended.  - 12/15/13  1. Interval resolution of th   Anemia    Anemia due to stage 3 chronic kidney disease ([place] 07/27/2018   Anemia of chronic disease 06/17/2018   06/2018   Asthma    CKD (chronic kidney disease), stage III ([place] 04/16/2017   Class 3 severe obesity due to excess calories with serious comorbidity and body mass index (BMI) of 40.0 to 44.9 in adult ([facility] 06/15/2018   Diabetes mellitus without complication ([place] 129/5284  Elevated blood pressure reading 05/09/2019   Fibroids    GERD (gastroesophageal reflux disease)    History of blood transfusion 04/2013   Hyperlipidemia    Hyperlipidemia associated with type 2 diabetes mellitus ([facility] 05/11/2019   Incarcerated hernia s/p lap repair w mesh 12/08/2014 12/08/2014   Neuropathy 04/04/2016   Obesity, Class III, BMI 40-49.9 (morbid obesity) ([facility]    Seasonal allergic rhinitis 12/21/2015   Seizure disorder ([facility] 05/14/2018   Shortness of breath dyspnea    Sleep apnea    Tobacco dependence 10/21/2013   Type II diabetes  mellitus with renal manifestations ([place]) 12/21/2015   Vitamin D deficiency 06/15/2018   Past Surgical History:  Past Surgical History:  Procedure Laterality Date   ABDOMINAL HYSTERECTOMY N/A 12/08/2014   Procedure: HYSTERECTOMY ABDOMINAL;  Surgeon: Woodroe Mode, MD;  [facility];  Service: Gynecology;  Laterality: N/A;   DILATION AND CURETTAGE OF UTERUS     DILITATION & CURRETTAGE/HYSTROSCOPY WITH VERSAPOINT RESECTION N/A 01/06/2014   Procedure: Corena Pilgrim WITH VERSAPOINT;  Surgeon: Woodroe Mode, MD;  Location: [place] [facility];  Service: Gynecology;   Laterality: N/A;   LAPAROSCOPIC LYSIS OF ADHESIONS N/A 12/08/2014   Procedure: LAPAROSCOPIC LYSIS OF ADHESIONS;  Surgeon: Michael Boston, MD;  [facility];  Service: General;  Laterality: N/A;   OVARIAN CYST REMOVAL     cyst not removed. Cyst drained   SUPRA-UMBILICAL HERNIA N/A 10/07/5782   Procedure: LAPARSCOPIC SUPRA-UMBILICAL HERNIA REPAIR ;  Surgeon: Michael Boston, MD;  [facility];  Service: General;  Laterality: N/A;   VENTRAL HERNIA REPAIR N/A 12/08/2014   Procedure: LAPAROSCOPIC INCARCERATED INCISIONAL VENTRAL WALL HERNIA REPAIR;  Surgeon: Michael Boston, MD;  [facility];  Service: General;  Laterality: N/A;    Social History:  reports that she quit smoking about 2 years ago. Her smoking use included cigarettes. She has a 6.25 pack-year smoking history. She has never used smokeless tobacco. She reports that she does not currently use alcohol. She reports that she does not use drugs. Family History:  Family History  Problem Relation Age of Onset   Hypertension Mother    Diabetes Father    Breast cancer Maternal Aunt    Diabetes Maternal Aunt    Diabetes Maternal Aunt    Diabetes Maternal Aunt    Diabetes Paternal Aunt    Colon polyps Neg Hx    Colon cancer Neg Hx    Esophageal cancer Neg Hx    Stomach cancer Neg Hx    Rectal cancer Neg Hx      HOME MEDICATIONS: Allergies as of 03/11/2022       Reactions   Doxycycline    "bad dreaming, hallucination, dizziness" per pt        Medication List        Accurate as of March 11, 2022  7:38 AM. If you have any questions, ask your nurse or doctor.          Accu-Chek Aviva Plus test strip Generic drug: glucose blood Use to check blood sugar once daily.   Accu-Chek Softclix Lancets lancets Use to check blood sugar once daily.   atorvastatin 10 MG tablet Commonly known as: LIPITOR Take 1 tablet (10 mg total) by mouth daily.   blood glucose meter kit and supplies Dispense based on patient and insurance  preference. Use up to four times daily as directed. (FOR ICD-10 E10.9, E11.9).   cetirizine 10 MG tablet Commonly known as: ZYRTEC Take 1 tablet (10 mg total) by mouth daily.   Dexcom G7 Sensor Misc 1 Device by Does not apply route as directed.   fluticasone 50 MCG/ACT nasal spray Commonly known as: FLONASE USE 1 SPRAY(S) IN EACH NOSTRIL ONCE DAILY AS NEEDED FOR ALLERGIES OR  RHINITIS   Insulin Pen Needle 32G X 4 MM Misc 1 Device by Does not apply route in the morning, at noon, in the evening, and at bedtime.   Levemir FlexPen 100 UNIT/ML FlexPen Generic drug: insulin detemir Inject 20 Units into the skin 2 (two) times daily.   pantoprazole 40 MG tablet  Commonly known as: PROTONIX Take 1 tablet (40 mg total) by mouth daily.   SUMAtriptan 50 MG tablet Commonly known as: Imitrex May repeat in 2 hours if headache persists or recurs.   Trulicity 3 GE/9.5MW Sopn Generic drug: Dulaglutide Inject 3 mg as directed once a week.         ALLERGIES: Allergies  Allergen Reactions   Doxycycline     "bad dreaming, hallucination, dizziness" per pt     REVIEW OF SYSTEMS: A comprehensive ROS was conducted with the patient and is negative except as per HPI and below:  ROS    OBJECTIVE:   VITAL SIGNS: LMP 11/26/2014 Comment: negative preg test    PHYSICAL EXAM:  General: Pt appears well and is in NAD  Hydration: Well-hydrated with moist mucous membranes and good skin turgor  HEENT: Head: Unremarkable with good dentition. Oropharynx clear without exudate.  Eyes: External eye exam normal without stare, lid lag or exophthalmos.  EOM intact.  PERRL.  Neck: General: Supple without adenopathy or carotid bruits. Thyroid: Thyroid size normal.  No goiter or nodules appreciated. No thyroid bruit.  Lungs: Clear with good BS bilat with no rales, rhonchi, or wheezes  Heart: RRR with normal S1 and S2 and no gallops; no murmurs; no rub  Abdomen: Normoactive bowel sounds, soft, nontender,  without masses or organomegaly palpable  Extremities:  Lower extremities - No pretibial edema. No lesions.  Neuro: MS is good with appropriate affect, pt is alert and Ox3     DATA REVIEWED:  Lab Results  Component Value Date   HGBA1C >15.0 10/16/2021   HGBA1C 8.2 (A) 04/10/2021   HGBA1C 8.0 (H) 11/30/2020   Lab Results  Component Value Date   LDLCALC 119 (H) 05/09/2019   CREATININE 1.58 (H) 09/28/2020   Lab Results  Component Value Date   MICRALBCREAT 6 05/09/2019    Lab Results  Component Value Date   CHOL 189 05/09/2019   HDL 47 05/09/2019   LDLCALC 119 (H) 05/09/2019   TRIG 131 05/09/2019   CHOLHDL 4.0 05/09/2019      In office BG 210 mg/dL   ASSESSMENT / PLAN / RECOMMENDATIONS:   1) Type 2 Diabetes Mellitus, poorly controlled, With CKD III complications - Most recent A1c of >15.0%. Goal A1c <7.0%.    -Patient with worsening glycemic control and severe hyperglycemia at this time, however in office BG was high , indicating BG reading >500 mg/dL.  Patient is asymptomatic at this time, she was advised to report to the ED should she start having nausea, vomiting, or confusion -She was provided with a sample pen of Fiasp to use based on correction scale -We will increase her Levemir and Trulicity as below - Patient is not a candidate for SGLT2 inhibitors due to history of DKA -A prescription of Dexcom was faxed to her pharmacy and she was provided with a sample of Dexcom G7   MEDICATIONS: Increase Levemir 20 units twice daily Increase Trulicity 3 Mg weekly Correction factor : Fiasp (BG -130/30)  EDUCATION / INSTRUCTIONS: BG monitoring instructions: Patient is instructed to check her blood sugars 1 times a day, fasting. Call [facility] if: BG persistently < 70  I reviewed the Rule of 15 for the treatment of hypoglycemia in detail with the patient. Literature supplied.   2) Diabetic complications:  Eye: Does not have known diabetic retinopathy.   Neuro/ Feet: Does not have known diabetic peripheral neuropathy. Renal: Patient does  have known baseline CKD. She  is not on an ACEI/ARB at present.    Follow-up in 3  Signed electronically by: Mack Guise, MD  [facility]  [facility] [address] [place], [place] [address] Phone: [phone] FAX: [phone]   CC: Ladell Pier, MD [address] [place] [address] Phone: [phone]  Fax: [phone]    Return to Endocrinology clinic as below: Future Appointments  Date Time Provider [place]  03/11/2022  2:40 PM Jehan Ranganathan, Melanie Crazier, MD [facility] None

## 2022-03-17 NOTE — Progress Notes (Signed)
Name: Sagal Gayton  MRN/ DOB: [id number], 1970/08/17   Age/ Sex: 52 y.o., female    PCP: Ladell Pier, MD   Reason for Endocrinology Evaluation: Type 2 Diabetes Mellitus     Date of Initial Endocrinology Visit: 04/10/2021    PATIENT IDENTIFIER: Rebecca Johnston is a 52 y.o. female with a past medical history of T2DM, Migraine headaches, T2DM and seizure disorcer . The patient presented for initial endocrinology clinic visit on 04/10/2021 for consultative assistance with her diabetes management.    HPI: Ms. Latorre was    Diagnosed with DM 2013 Prior Medications tried/Intolerance: insulin started 08/2020.  Trulicity started 09/8674             Hemoglobin A1c has ranged from 6.8% in 2018, peaking at 14.9% in 2022. Patient has required hospitalization :08/2020 with DKA  On her initial visit to our clinic she had an A1c 8.2%, she was on levemir and Trulicity . She declined increasing Trulicity on her visit with me    She was approved for dexcom 10/2021 SUBJECTIVE:   During the last visit (10/16/2021): A1c >15.0 %     Today (03/18/22): Ms. Machamer is here for a follow up on diabetes management. She stopped using the dexcom  2 days ago as the sensor fell off but somehow she did not have the app downloaded on her phone today ,so we were unable to download any data    Denies nausea, vomiting or diarrhea recently  She drinks mix sweet tea  but no soda    HOME DIABETES REGIMEN: Trulicity 3 mg daiy  Levemir 20 units twice daily  Correction factor: Fiasp (BG-130/30)      Statin: yes ACE-I/ARB: no   METER DOWNLOAD SUMMARY: Did not bring   DIABETIC COMPLICATIONS: Microvascular complications:  CKD III  Denies: neuropathy,  Last eye exam: Completed 07/2020  Macrovascular complications:   Denies: CAD, PVD, CVA   PAST HISTORY: Past Medical History:  Past Medical History:  Diagnosis Date   Abnormal CT scan, chest 09/02/2013   CTa 04/18/13  1. Study limited by  body habitus with no pulmonary embolism  detected.  2. Multiple borderline and a few mildly enlarged nonspecific  mediastinal and hilar lymph nodes. Multiple pulmonary nodules  bilaterally the largest measuring 8 mm. If the patient is at high  risk for bronchogenic carcinoma, follow-up chest CT at 3-63month is  recommended.  - 12/15/13  1. Interval resolution of th   Anemia    Anemia due to stage 3 chronic kidney disease ([facility] 07/27/2018   Anemia of chronic disease 06/17/2018   06/2018   Asthma    CKD (chronic kidney disease), stage III ([facility] 04/16/2017   Class 3 severe obesity due to excess calories with serious comorbidity and body mass index (BMI) of 40.0 to 44.9 in adult ([facility] 06/15/2018   Diabetes mellitus without complication ([facility] 172/0947  Elevated blood pressure reading 05/09/2019   Fibroids    GERD (gastroesophageal reflux disease)    History of blood transfusion 04/2013   Hyperlipidemia    Hyperlipidemia associated with type 2 diabetes mellitus ([facility] 05/11/2019   Incarcerated hernia s/p lap repair w mesh 12/08/2014 12/08/2014   Neuropathy 04/04/2016   Obesity, Class III, BMI 40-49.9 (morbid obesity) ([facility]    Seasonal allergic rhinitis 12/21/2015   Seizure disorder ([facility] 05/14/2018   Shortness of breath dyspnea    Sleep apnea    Tobacco dependence 10/21/2013   Type II diabetes mellitus with renal  manifestations ([place]) 12/21/2015   Vitamin D deficiency 06/15/2018   Past Surgical History:  Past Surgical History:  Procedure Laterality Date   ABDOMINAL HYSTERECTOMY N/A 12/08/2014   Procedure: HYSTERECTOMY ABDOMINAL;  Surgeon: Woodroe Mode, MD;  [facility];  Service: Gynecology;  Laterality: N/A;   DILATION AND CURETTAGE OF UTERUS     DILITATION & CURRETTAGE/HYSTROSCOPY WITH VERSAPOINT RESECTION N/A 01/06/2014   Procedure: Corena Pilgrim WITH VERSAPOINT;  Surgeon: Woodroe Mode, MD;  Location: [place] [facility];  Service: Gynecology;  Laterality: N/A;   LAPAROSCOPIC LYSIS OF ADHESIONS  N/A 12/08/2014   Procedure: LAPAROSCOPIC LYSIS OF ADHESIONS;  Surgeon: Michael Boston, MD;  [facility];  Service: General;  Laterality: N/A;   OVARIAN CYST REMOVAL     cyst not removed. Cyst drained   SUPRA-UMBILICAL HERNIA N/A 90/04/4095   Procedure: LAPARSCOPIC SUPRA-UMBILICAL HERNIA REPAIR ;  Surgeon: Michael Boston, MD;  [facility];  Service: General;  Laterality: N/A;   VENTRAL HERNIA REPAIR N/A 12/08/2014   Procedure: LAPAROSCOPIC INCARCERATED INCISIONAL VENTRAL WALL HERNIA REPAIR;  Surgeon: Michael Boston, MD;  [facility];  Service: General;  Laterality: N/A;    Social History:  reports that she quit smoking about 2 years ago. Her smoking use included cigarettes. She has a 6.25 pack-year smoking history. She has never used smokeless tobacco. She reports that she does not currently use alcohol. She reports that she does not use drugs. Family History:  Family History  Problem Relation Age of Onset   Hypertension Mother    Diabetes Father    Breast cancer Maternal Aunt    Diabetes Maternal Aunt    Diabetes Maternal Aunt    Diabetes Maternal Aunt    Diabetes Paternal Aunt    Colon polyps Neg Hx    Colon cancer Neg Hx    Esophageal cancer Neg Hx    Stomach cancer Neg Hx    Rectal cancer Neg Hx      HOME MEDICATIONS: Allergies as of 03/18/2022       Reactions   Doxycycline    "bad dreaming, hallucination, dizziness" per pt        Medication List        Accurate as of March 18, 2022  8:42 AM. If you have any questions, ask your nurse or doctor.          Accu-Chek Aviva Plus test strip Generic drug: glucose blood Use to check blood sugar once daily.   Accu-Chek Softclix Lancets lancets Use to check blood sugar once daily.   atorvastatin 10 MG tablet Commonly known as: LIPITOR Take 1 tablet (10 mg total) by mouth daily.   blood glucose meter kit and supplies Dispense based on patient and insurance preference. Use up to four times daily as directed.  (FOR ICD-10 E10.9, E11.9).   cetirizine 10 MG tablet Commonly known as: ZYRTEC Take 1 tablet (10 mg total) by mouth daily.   Dexcom G7 Sensor Misc 1 Device by Does not apply route as directed.   fluticasone 50 MCG/ACT nasal spray Commonly known as: FLONASE USE 1 SPRAY(S) IN EACH NOSTRIL ONCE DAILY AS NEEDED FOR ALLERGIES OR  RHINITIS   Insulin Pen Needle 32G X 4 MM Misc 1 Device by Does not apply route in the morning, at noon, in the evening, and at bedtime.   Levemir FlexPen 100 UNIT/ML FlexPen Generic drug: insulin detemir Inject 20 Units into the skin 2 (two) times daily.   pantoprazole 40 MG tablet Commonly known as:  PROTONIX Take 1 tablet (40 mg total) by mouth daily.   SUMAtriptan 50 MG tablet Commonly known as: Imitrex May repeat in 2 hours if headache persists or recurs.   Trulicity 3 BS/4.9QP Sopn Generic drug: Dulaglutide Inject 3 mg as directed once a week.         ALLERGIES: Allergies  Allergen Reactions   Doxycycline     "bad dreaming, hallucination, dizziness" per pt     REVIEW OF SYSTEMS: A comprehensive ROS was conducted with the patient and is negative except as per HPI     OBJECTIVE:   VITAL SIGNS: BP 126/82 (BP Location: Left Arm, Patient Position: Sitting, Cuff Size: Large)   Pulse 95   Ht '5\' 4"'$  (1.626 m)   Wt 269 lb (122 kg)   LMP 11/26/2014 Comment: negative preg test   SpO2 98%   BMI 46.17 kg/m    PHYSICAL EXAM:  General: Pt appears well and is in NAD  Lungs: Clear with good BS bilat with no rales, rhonchi, or wheezes  Heart: RRR   Abdomen: soft, nontender, without masses or organomegaly palpable  Extremities:  Lower extremities - No pretibial edema.   Neuro: MS is good with appropriate affect, pt is alert and Ox3     DATA REVIEWED:  Lab Results  Component Value Date   HGBA1C >15.0 10/16/2021   HGBA1C 8.2 (A) 04/10/2021   HGBA1C 8.0 (H) 11/30/2020   Lab Results  Component Value Date   LDLCALC 119 (H) 05/09/2019    CREATININE 1.58 (H) 09/28/2020   Lab Results  Component Value Date   MICRALBCREAT 6 05/09/2019    Lab Results  Component Value Date   CHOL 189 05/09/2019   HDL 47 05/09/2019   LDLCALC 119 (H) 05/09/2019   TRIG 131 05/09/2019   CHOLHDL 4.0 05/09/2019        In office BG 211 mg/dL   ASSESSMENT / PLAN / RECOMMENDATIONS:   1) Type 2 Diabetes Mellitus, Sub-Optimally controlled, With CKD III complications - Most recent A1c of 7.8 %. Goal A1c <7.0%.    - A1c trended down from >15.0% to 7.8% , I have praised the patient on drastic improvement in her glycemic control and I have encouraged her to continue with lifestyle changes and avoiding sugar sweetened beverages - We will increase her Levemir  - Patient is not a candidate for SGLT2 inhibitors due to history of DKA    MEDICATIONS: Increase Levemir 22 units twice daily Continue Trulicity 3 Mg weekly  EDUCATION / INSTRUCTIONS: BG monitoring instructions: Patient is instructed to check her blood sugars 1 times a day, fasting. Call [facility] [facility] if: BG persistently < 70  I reviewed the Rule of 15 for the treatment of hypoglycemia in detail with the patient. Literature supplied.   2) Diabetic complications:  Eye: Does not have known diabetic retinopathy.  Neuro/ Feet: Does not have known diabetic peripheral neuropathy. Renal: Patient does  have known baseline CKD. She is not on an ACEI/ARB at present.  3) Dyslipidemia:   - lipid panel today shows LDL above goal  - Will increase Atorvastatin   Medication   Stop Atorvastatin 10 mg  Start Atorvastatin 20 mg daily     Follow-up in 6 months   Signed electronically by: Mack Guise, MD  [facility]  [facility] [address] [place], [place] [address] Phone: [phone] FAX: [phone]   CC: Ladell Pier, MD [place]  [address] [place] [address] Phone: [phone]  Fax:  [phone]    Return to Endocrinology clinic as below: No future appointments.

## 2022-03-18 ENCOUNTER — Ambulatory Visit (INDEPENDENT_AMBULATORY_CARE_PROVIDER_SITE_OTHER): Payer: Medicaid Other | Admitting: Internal Medicine

## 2022-03-18 ENCOUNTER — Encounter: Payer: Self-pay | Admitting: Internal Medicine

## 2022-03-18 VITALS — BP 126/82 | HR 95 | Ht 64.0 in | Wt 269.0 lb

## 2022-03-18 DIAGNOSIS — Z794 Long term (current) use of insulin: Secondary | ICD-10-CM | POA: Diagnosis not present

## 2022-03-18 DIAGNOSIS — E1165 Type 2 diabetes mellitus with hyperglycemia: Secondary | ICD-10-CM | POA: Diagnosis not present

## 2022-03-18 DIAGNOSIS — N183 Chronic kidney disease, stage 3 unspecified: Secondary | ICD-10-CM | POA: Diagnosis not present

## 2022-03-18 DIAGNOSIS — E1122 Type 2 diabetes mellitus with diabetic chronic kidney disease: Secondary | ICD-10-CM | POA: Diagnosis not present

## 2022-03-18 DIAGNOSIS — E785 Hyperlipidemia, unspecified: Secondary | ICD-10-CM

## 2022-03-18 LAB — MICROALBUMIN / CREATININE URINE RATIO
Creatinine,U: 147.8 mg/dL
Microalb Creat Ratio: 2.9 mg/g (ref 0.0–30.0)
Microalb, Ur: 4.3 mg/dL — ABNORMAL HIGH (ref 0.0–1.9)

## 2022-03-18 LAB — LIPID PANEL
Cholesterol: 178 mg/dL (ref 0–200)
HDL: 45.4 mg/dL (ref >39.00)
LDL Cholesterol: 103 mg/dL — ABNORMAL HIGH (ref 0–99)
NonHDL: 132.24
Total CHOL/HDL Ratio: 4
Triglycerides: 147 mg/dL (ref 0.0–149.0)
VLDL: 29.4 mg/dL (ref 0.0–40.0)

## 2022-03-18 LAB — BASIC METABOLIC PANEL
BUN: 24 mg/dL — ABNORMAL HIGH (ref 6–23)
CO2: 23 mEq/L (ref 19–32)
Calcium: 9.9 mg/dL (ref 8.4–10.5)
Chloride: 104 mEq/L (ref 96–112)
Creatinine, Ser: 1.76 mg/dL — ABNORMAL HIGH (ref 0.40–1.20)
GFR: 32.89 mL/min — ABNORMAL LOW (ref >60.00)
Glucose, Bld: 206 mg/dL — ABNORMAL HIGH (ref 70–99)
Potassium: 4.3 mEq/L (ref 3.5–5.1)
Sodium: 136 mEq/L (ref 135–145)

## 2022-03-18 LAB — POCT GLUCOSE (DEVICE FOR HOME USE): Glucose Fasting, POC: 211 mg/dL — AB (ref 70–99)

## 2022-03-18 LAB — POCT GLYCOSYLATED HEMOGLOBIN (HGB A1C): Hemoglobin A1C: 7.8 % — AB (ref 4.0–5.6)

## 2022-03-18 MED ORDER — LEVEMIR FLEXPEN 100 UNIT/ML ~~LOC~~ SOPN: 22.0000 [IU] | PEN_INJECTOR | Freq: Two times a day (BID) | SUBCUTANEOUS | 3 refills | Status: DC

## 2022-03-18 MED ORDER — TRULICITY 3 MG/0.5ML ~~LOC~~ SOAJ: 3.0000 mg | SUBCUTANEOUS | 3 refills | Status: DC

## 2022-03-18 MED ORDER — INSULIN PEN NEEDLE 32G X 4 MM MISC: 1.0000 | Freq: Two times a day (BID) | 3 refills | Status: DC

## 2022-03-18 NOTE — Patient Instructions (Signed)
Continue  Trulicity 3 mg weekly  Increase Levemir 22 units twice daily      HOW TO TREAT LOW BLOOD SUGARS (Blood sugar LESS THAN 70 MG/DL) Please follow the RULE OF 15 for the treatment of hypoglycemia treatment (when your (blood sugars are less than 70 mg/dL)   STEP 1: Take 15 grams of carbohydrates when your blood sugar is low, which includes:  3-4 GLUCOSE TABS  OR 3-4 OZ OF JUICE OR REGULAR SODA OR ONE TUBE OF GLUCOSE GEL    STEP 2: RECHECK blood sugar in 15 MINUTES STEP 3: If your blood sugar is still low at the 15 minute recheck --> then, go back to STEP 1 and treat AGAIN with another 15 grams of carbohydrates.

## 2022-03-19 MED ORDER — ATORVASTATIN CALCIUM 20 MG PO TABS: 20.0000 mg | ORAL_TABLET | Freq: Every day | ORAL | 3 refills | Status: DC

## 2022-03-25 DIAGNOSIS — N183 Chronic kidney disease, stage 3 unspecified: Secondary | ICD-10-CM | POA: Diagnosis not present

## 2022-03-25 DIAGNOSIS — G4733 Obstructive sleep apnea (adult) (pediatric): Secondary | ICD-10-CM | POA: Diagnosis not present

## 2022-03-25 DIAGNOSIS — Z6841 Body Mass Index (BMI) 40.0 and over, adult: Secondary | ICD-10-CM | POA: Diagnosis not present

## 2022-03-25 DIAGNOSIS — E1122 Type 2 diabetes mellitus with diabetic chronic kidney disease: Secondary | ICD-10-CM | POA: Diagnosis not present

## 2022-03-25 DIAGNOSIS — Z862 Personal history of diseases of the blood and blood-forming organs and certain disorders involving the immune mechanism: Secondary | ICD-10-CM | POA: Diagnosis not present

## 2022-04-06 IMAGING — MG MM DIGITAL SCREENING BILAT W/ TOMO AND CAD
6 of 10 series · 6 of 30 positions shown · non-contrast
Comparison: Previous exam(s).

ACR Breast Density Category a: The breast tissue is almost entirely
fatty.

CLINICAL DATA: Screening.

EXAM:
DIGITAL SCREENING BILATERAL MAMMOGRAM WITH TOMOSYNTHESIS AND CAD
TECHNIQUE: Bilateral screening digital craniocaudal and mediolateral oblique
mammograms were obtained. Bilateral screening digital breast
tomosynthesis was performed. The images were evaluated with
computer-aided detection.

[L MLO synth-2D (1 of 2)]
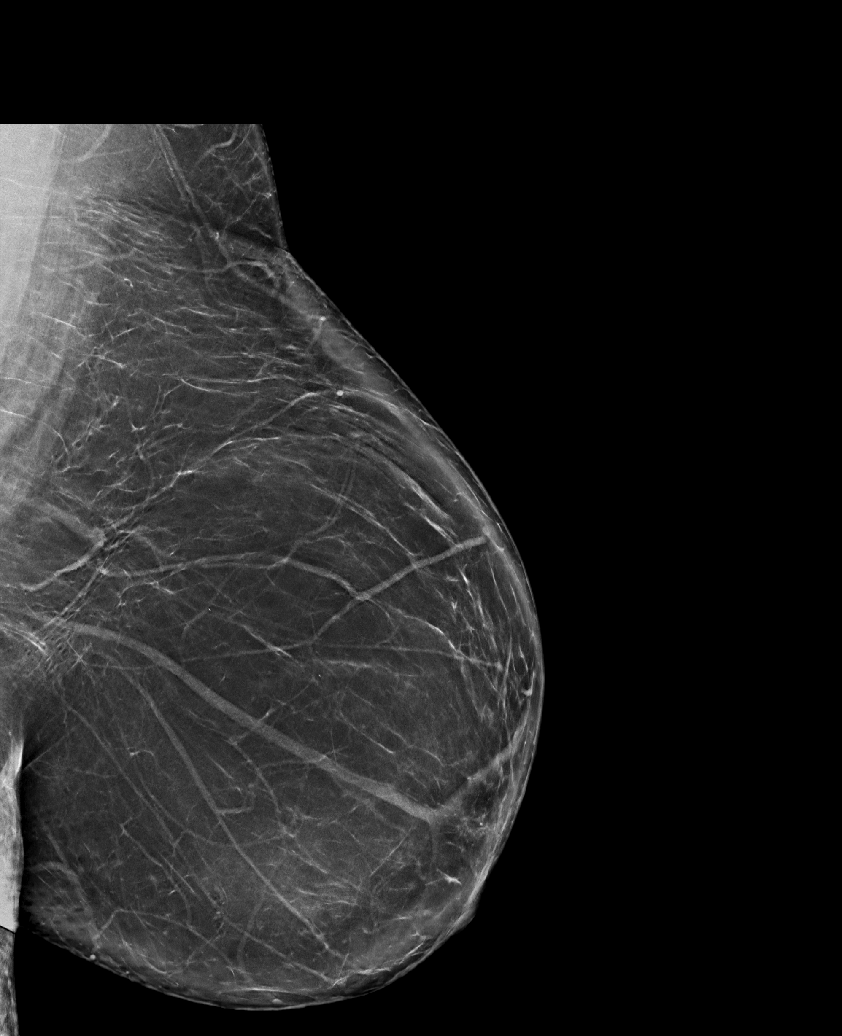

[R MLO synth-2D]
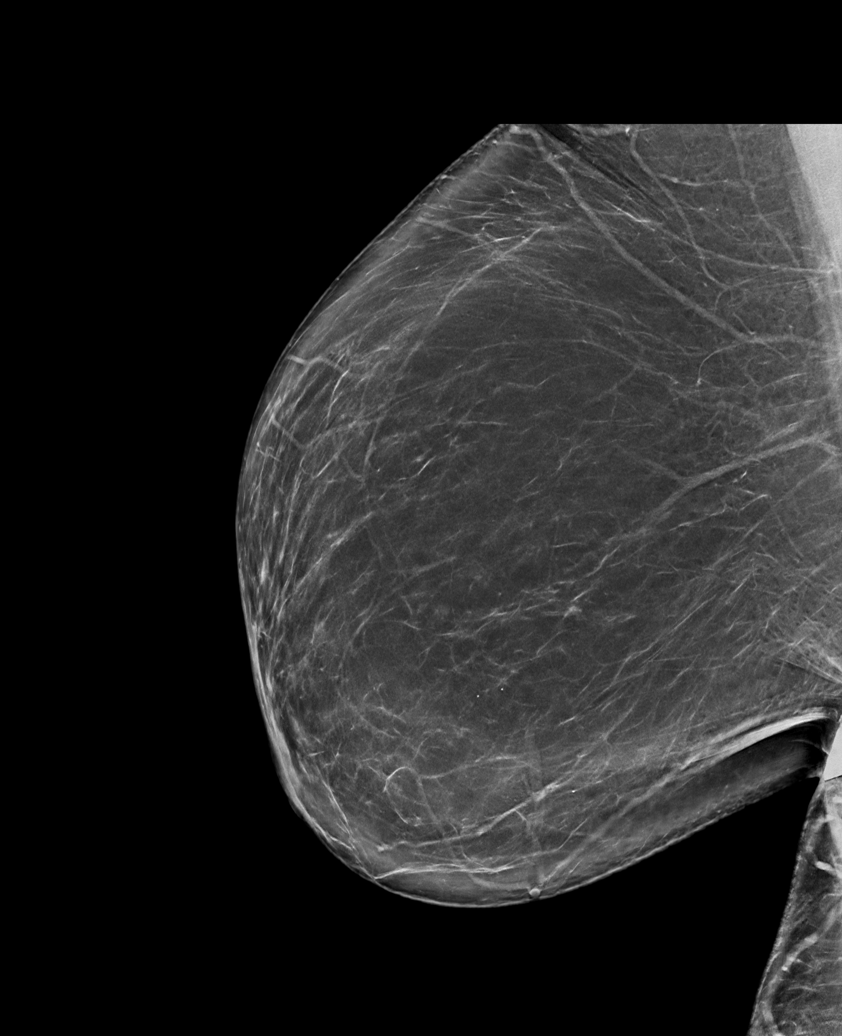

[L MLO synth-2D (2 of 2)]
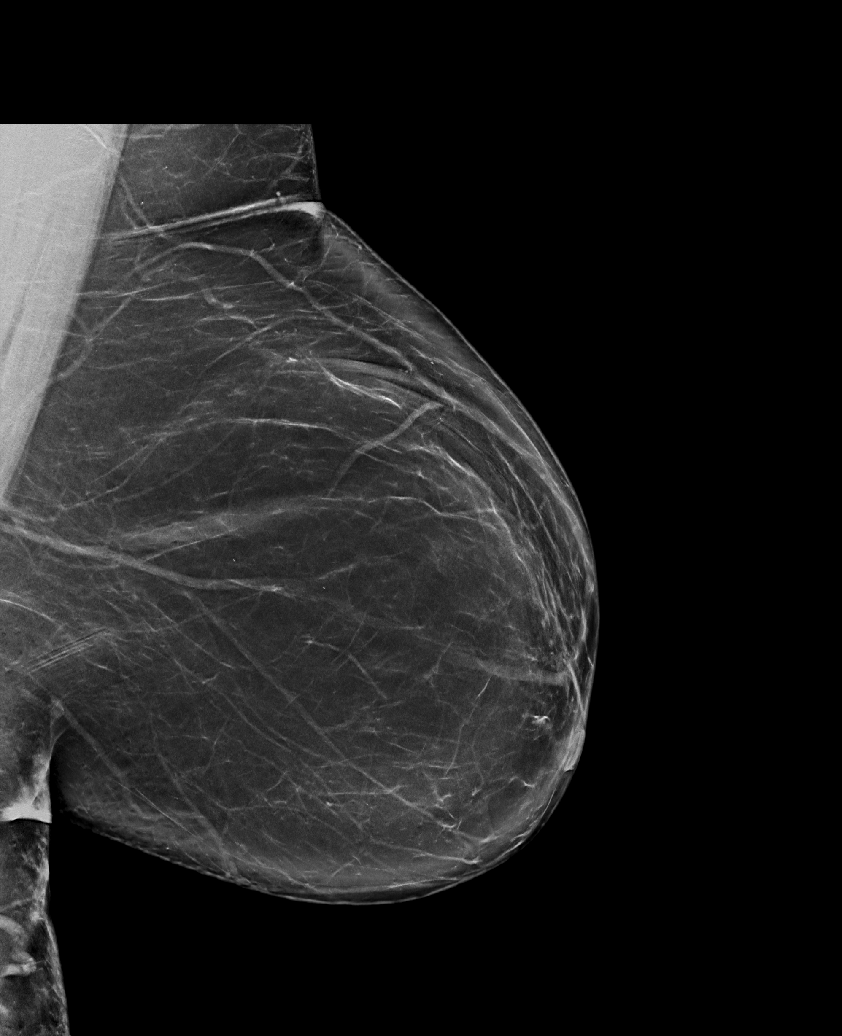

[L CC synth-2D]
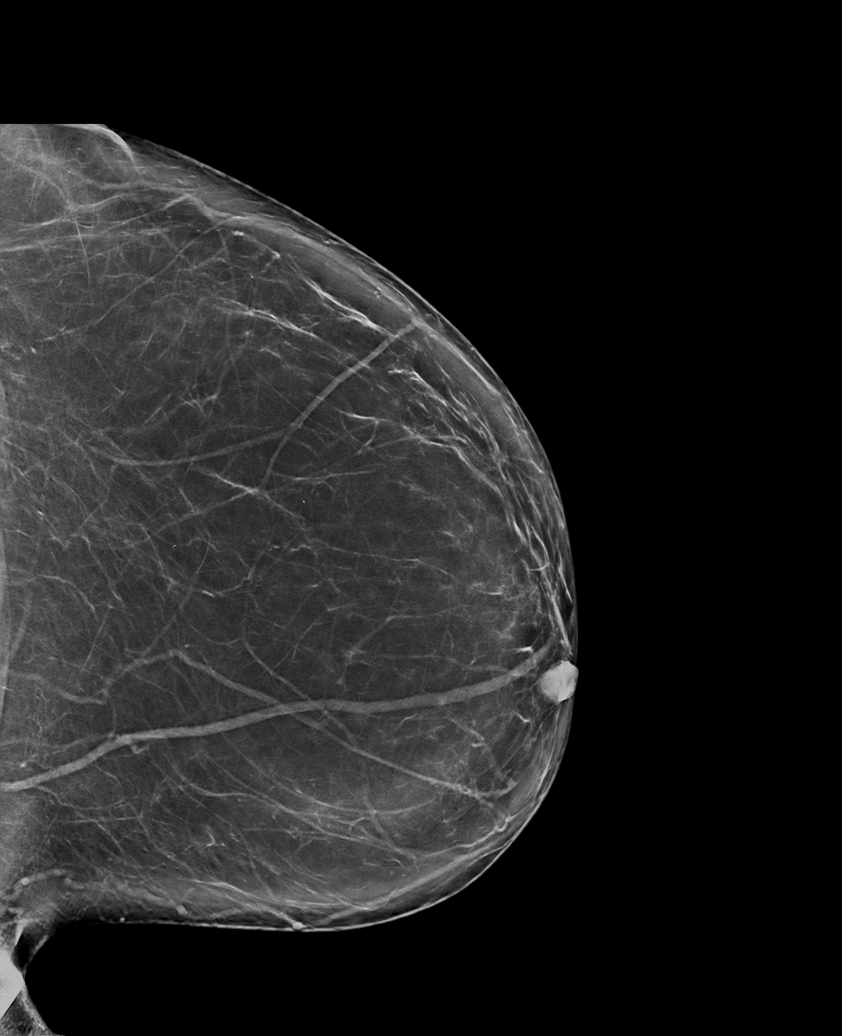

[R CC synth-2D]
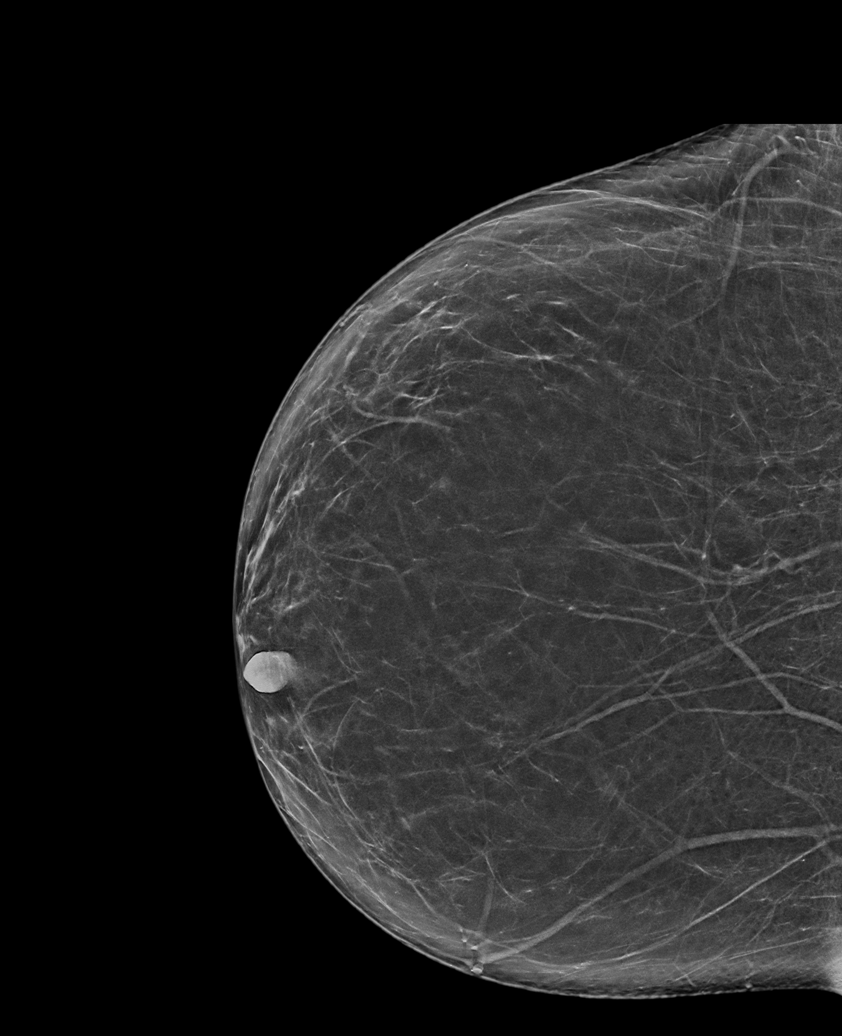

[R MLO tomo · tomo slice 46/91.0]
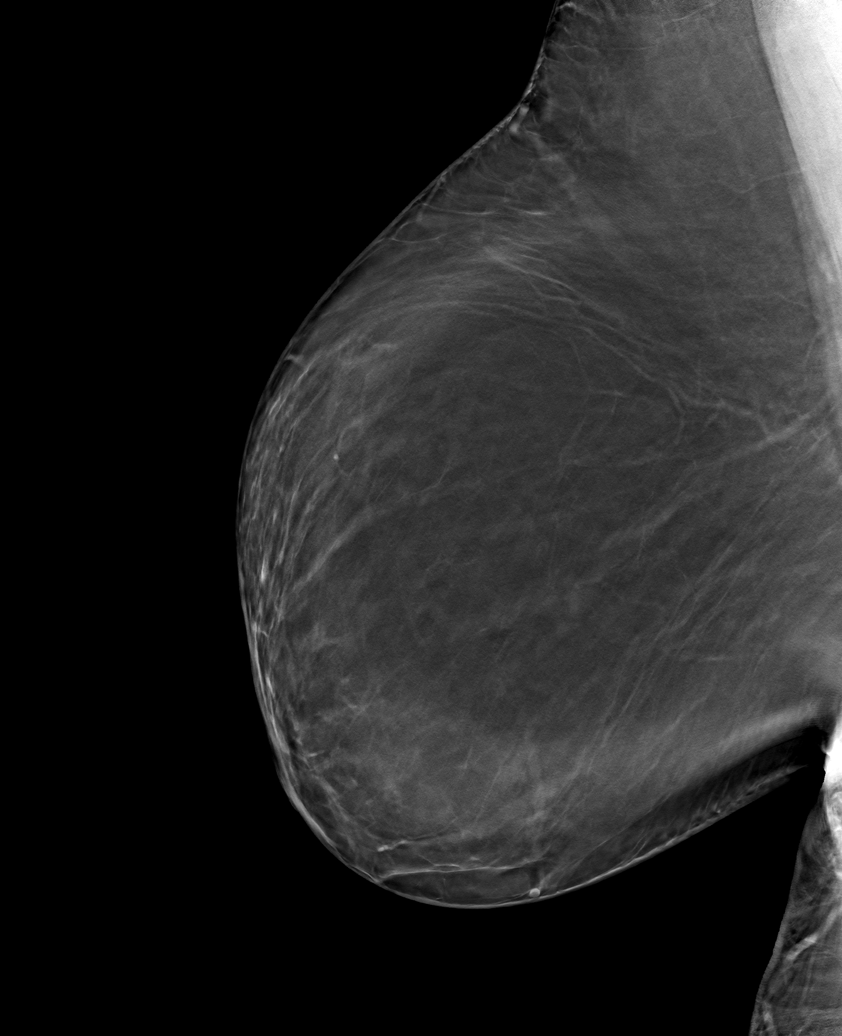

[6 of 30 positions shown; findings below may reference images not displayed]

FINDINGS: There are no findings suspicious for malignancy.
IMPRESSION: No mammographic evidence of malignancy. A result letter of this
screening mammogram will be mailed directly to the patient.

RECOMMENDATION:
Screening mammogram in one year. (Code:0E-3-N98)

BI-RADS CATEGORY  1: Negative.

## 2022-05-05 ENCOUNTER — Other Ambulatory Visit: Payer: Self-pay | Admitting: Internal Medicine

## 2022-05-05 DIAGNOSIS — Z1231 Encounter for screening mammogram for malignant neoplasm of breast: Secondary | ICD-10-CM

## 2022-06-02 ENCOUNTER — Telehealth: Payer: Self-pay

## 2022-06-02 NOTE — Telephone Encounter (Signed)
Patient needs PA on Dexcom

## 2022-06-12 ENCOUNTER — Telehealth: Payer: Self-pay

## 2022-06-12 NOTE — Telephone Encounter (Signed)
Patient will stop by and pick up a sample of the G7.

## 2022-06-12 NOTE — Telephone Encounter (Signed)
Pharmacy calling regard PA. Request was sent on 06/02/22

## 2022-06-16 ENCOUNTER — Telehealth: Payer: Self-pay

## 2022-06-16 NOTE — Telephone Encounter (Signed)
Patient Advocate Encounter   Received notification from pt msgs that prior authorization is required for Dexcom G7 Sensor  Submitted: 06/16/22 Key [id number]  Status is pending

## 2022-06-17 ENCOUNTER — Ambulatory Visit
Admission: RE | Admit: 2022-06-17 | Discharge: 2022-06-17 | Disposition: A | Discharge status: Home / Self Care | Payer: Medicaid Other | Source: Ambulatory Visit | Attending: Internal Medicine | Admitting: Internal Medicine

## 2022-06-17 DIAGNOSIS — Z1231 Encounter for screening mammogram for malignant neoplasm of breast: Secondary | ICD-10-CM | POA: Diagnosis not present

## 2022-06-17 NOTE — Telephone Encounter (Signed)
PA has been APPROVED. Approval letter has been attached in patients documents. 

## 2022-08-06 DIAGNOSIS — Z862 Personal history of diseases of the blood and blood-forming organs and certain disorders involving the immune mechanism: Secondary | ICD-10-CM | POA: Diagnosis not present

## 2022-08-06 DIAGNOSIS — Z6841 Body Mass Index (BMI) 40.0 and over, adult: Secondary | ICD-10-CM | POA: Diagnosis not present

## 2022-08-06 DIAGNOSIS — G4733 Obstructive sleep apnea (adult) (pediatric): Secondary | ICD-10-CM | POA: Diagnosis not present

## 2022-08-06 DIAGNOSIS — E1129 Type 2 diabetes mellitus with other diabetic kidney complication: Secondary | ICD-10-CM | POA: Diagnosis not present

## 2022-08-06 DIAGNOSIS — N183 Chronic kidney disease, stage 3 unspecified: Secondary | ICD-10-CM | POA: Diagnosis not present

## 2022-08-06 DIAGNOSIS — E1122 Type 2 diabetes mellitus with diabetic chronic kidney disease: Secondary | ICD-10-CM | POA: Diagnosis not present

## 2022-08-07 LAB — LAB REPORT - SCANNED
A1c: 15.5
Albumin, Urine POC: 21.1
Albumin/Creatinine Ratio, Urine, POC: 29
Creatinine, POC: 73.9 mg/dL
EGFR: 44

## 2022-08-14 ENCOUNTER — Telehealth: Payer: Self-pay | Admitting: Internal Medicine

## 2022-08-20 NOTE — Telephone Encounter (Signed)
Called & spoke to the patient. Verified name & DOB. Informed patient of lab results from her kidney specialist Dr.Kruska of A1C 15.5 in 08/06/22. Instructed patient to reach out to her endocrinologist Dr.Shamleffler about this ASAP due to Dr.Johnson being out of the office. Patient expressed verbal understanding of all discussed.

## 2022-08-24 ENCOUNTER — Ambulatory Visit (HOSPITAL_COMMUNITY)
Admission: EM | Admit: 2022-08-24 | Discharge: 2022-08-24 | Disposition: A | Discharge status: Home / Self Care | Payer: Medicaid Other | Attending: Internal Medicine | Admitting: Internal Medicine

## 2022-08-24 ENCOUNTER — Encounter (HOSPITAL_COMMUNITY): Payer: Self-pay

## 2022-08-24 DIAGNOSIS — L732 Hidradenitis suppurativa: Secondary | ICD-10-CM | POA: Diagnosis not present

## 2022-08-24 MED ORDER — SULFAMETHOXAZOLE-TRIMETHOPRIM 800-160 MG PO TABS: 1.0000 | ORAL_TABLET | Freq: Two times a day (BID) | ORAL | 0 refills | Status: AC

## 2022-08-24 NOTE — ED Provider Notes (Signed)
[facility]    CSN: [id number] Arrival date & time: 08/24/22  1430      History   Chief Complaint Chief Complaint  Patient presents with   Mass    HPI Rebecca Johnston is a 52 y.o. female presents to UC today with complaint of multiple boils.  She reports this started 1 month ago.  She reports she often gets these under her axilla, underneath her abdominal fold and in her groins.  She reports she currently has 1 that is open and draining in her right lower abdominal fold in her inner right thigh.  She reports they have been draining pus.  She reports they are slightly tender.  She denies fever, chills, nausea or vomiting.  She has not tried anything OTC for this.  She has a history of DM2, last A1c 7.8%, 03/2022.  HPI  Past Medical History:  Diagnosis Date   Abnormal CT scan, chest 09/02/2013   CTa 04/18/13  1. Study limited by body habitus with no pulmonary embolism  detected.  2. Multiple borderline and a few mildly enlarged nonspecific  mediastinal and hilar lymph nodes. Multiple pulmonary nodules  bilaterally the largest measuring 8 mm. If the patient is at high  risk for bronchogenic carcinoma, follow-up chest CT at 3-9months is  recommended.  - 12/15/13  1. Interval resolution of th   Anemia    Anemia due to stage 3 chronic kidney disease (HCC) 07/27/2018   Anemia of chronic disease 06/17/2018   06/2018   Asthma    CKD (chronic kidney disease), stage III (HCC) 04/16/2017   Class 3 severe obesity due to excess calories with serious comorbidity and body mass index (BMI) of 40.0 to 44.9 in adult [facility]) 06/15/2018   Diabetes mellitus without complication (HCC) 12/2015   Elevated blood pressure reading 05/09/2019   Fibroids    GERD (gastroesophageal reflux disease)    History of blood transfusion 04/2013   Hyperlipidemia    Hyperlipidemia associated with type 2 diabetes mellitus (HCC) 05/11/2019   Incarcerated hernia s/p lap repair w mesh 12/08/2014 12/08/2014    Neuropathy 04/04/2016   Obesity, Class III, BMI 40-49.9 (morbid obesity) (HCC)    Seasonal allergic rhinitis 12/21/2015   Seizure disorder (HCC) 05/14/2018   Shortness of breath dyspnea    Sleep apnea    Tobacco dependence 10/21/2013   Type II diabetes mellitus with renal manifestations (HCC) 12/21/2015   Vitamin D deficiency 06/15/2018    Patient Active Problem List   Diagnosis Date Noted   Type 2 diabetes mellitus with hyperglycemia, with long-term current use of insulin (HCC) 04/11/2021   Type 2 diabetes mellitus with stage 3b chronic kidney disease, with long-term current use of insulin (HCC) 04/11/2021   DKA (diabetic ketoacidosis) (HCC) 09/27/2020   DKA, type 2 (HCC) 09/27/2020   Acute-on-chronic kidney injury (HCC) 09/27/2020   Visual complaint 09/27/2020   History of COVID-19 09/27/2020   Sleep apnea 04/17/2020   Seizure (HCC) 04/17/2020   Chronic migraine w/o aura w/o status migrainosus, not intractable 04/17/2020   Migraine 10/11/2019   Hyperlipidemia associated with type 2 diabetes mellitus (HCC) 05/11/2019   Elevated blood pressure reading 05/09/2019   Anemia due to stage 3 chronic kidney disease (HCC) 07/27/2018   Anemia of chronic disease 06/17/2018   Vitamin D deficiency 06/15/2018   Class 3 severe obesity due to excess calories with serious comorbidity and body mass index (BMI) of 40.0 to 44.9 in adult [facility]) 06/15/2018   Seizure disorder (  HCC) 05/14/2018   GERD (gastroesophageal reflux disease) 05/07/2018   CKD (chronic kidney disease), stage III (HCC) 04/16/2017   Neuropathy 04/04/2016   Seasonal allergic rhinitis 12/21/2015   Type II diabetes mellitus with renal manifestations (HCC) 12/21/2015   Incarcerated hernia s/p lap repair w mesh 12/08/2014 12/08/2014   Tobacco dependence 10/21/2013   Abnormal CT scan, chest 09/02/2013    Past Surgical History:  Procedure Laterality Date   ABDOMINAL HYSTERECTOMY N/A 12/08/2014   Procedure: HYSTERECTOMY ABDOMINAL;   Surgeon: Adam Phenix, MD;  Location: [facility];  Service: Gynecology;  Laterality: N/A;   DILATION AND CURETTAGE OF UTERUS     DILITATION & CURRETTAGE/HYSTROSCOPY WITH VERSAPOINT RESECTION N/A 01/06/2014   Procedure: Riley Kill WITH VERSAPOINT;  Surgeon: Adam Phenix, MD;  Location: [facility];  Service: Gynecology;  Laterality: N/A;   LAPAROSCOPIC LYSIS OF ADHESIONS N/A 12/08/2014   Procedure: LAPAROSCOPIC LYSIS OF ADHESIONS;  Surgeon: Karie Soda, MD;  Location: [facility];  Service: General;  Laterality: N/A;   OVARIAN CYST REMOVAL     cyst not removed. Cyst drained   SUPRA-UMBILICAL HERNIA N/A 12/08/2014   Procedure: LAPARSCOPIC SUPRA-UMBILICAL HERNIA REPAIR ;  Surgeon: Karie Soda, MD;  Location: [facility];  Service: General;  Laterality: N/A;   VENTRAL HERNIA REPAIR N/A 12/08/2014   Procedure: LAPAROSCOPIC INCARCERATED INCISIONAL VENTRAL WALL HERNIA REPAIR;  Surgeon: Karie Soda, MD;  Location: [facility];  Service: General;  Laterality: N/A;    OB History     Gravida  1   Para  0   Term  0   Preterm  0   AB  1   Living  0      SAB  0   IAB  0   Ectopic  1   Multiple  0   Live Births               Home Medications    Prior to Admission medications   Medication Sig Start Date End Date Taking? Authorizing Provider  Accu-Chek Softclix Lancets lancets Use to check blood sugar once daily. 08/15/20  Yes Marcine Matar, MD  atorvastatin (LIPITOR) 20 MG tablet Take 1 tablet (20 mg total) by mouth daily. 03/19/22  Yes Shamleffer, Konrad Dolores, MD  blood glucose meter kit and supplies Dispense based on patient and insurance preference. Use up to four times daily as directed. (FOR ICD-10 E10.9, E11.9). 05/26/18  Yes Angiulli, Mcarthur Rossetti, PA-C  cetirizine (ZYRTEC) 10 MG tablet Take 1 tablet (10 mg total) by mouth daily. 08/15/20  Yes Marcine Matar, MD  Continuous Blood Gluc Sensor (DEXCOM G7 SENSOR) MISC 1 Device by Does not apply route as directed. 10/16/21  Yes Shamleffer,  Konrad Dolores, MD  Dulaglutide (TRULICITY) 3 MG/0.5ML SOPN Inject 3 mg as directed once a week. 03/18/22  Yes Shamleffer, Konrad Dolores, MD  fluticasone (FLONASE) 50 MCG/ACT nasal spray USE 1 SPRAY(S) IN EACH NOSTRIL ONCE DAILY AS NEEDED FOR ALLERGIES OR  RHINITIS 08/15/20  Yes Marcine Matar, MD  glucose blood (ACCU-CHEK AVIVA PLUS) test strip Use to check blood sugar once daily. 09/24/21  Yes Marcine Matar, MD  insulin detemir (LEVEMIR FLEXPEN) 100 UNIT/ML FlexPen Inject 22 Units into the skin 2 (two) times daily. 03/18/22  Yes Shamleffer, Konrad Dolores, MD  Insulin Pen Needle 32G X 4 MM MISC 1 Device by Does not apply route 2 (two) times daily. 03/18/22  Yes Shamleffer, Konrad Dolores, MD  pantoprazole (PROTONIX) 40 MG tablet Take 1 tablet (  40 mg total) by mouth daily. 09/24/21  Yes Marcine Matar, MD  sulfamethoxazole-trimethoprim (BACTRIM DS) 800-160 MG tablet Take 1 tablet by mouth 2 (two) times daily for 7 days. 08/24/22 08/31/22 Yes BaitySalvadore Oxford, NP  SUMAtriptan (IMITREX) 50 MG tablet May repeat in 2 hours if headache persists or recurs. 10/16/20  Yes Levert Feinstein, MD    Family History Family History  Problem Relation Age of Onset   Hypertension Mother    Diabetes Father    Breast cancer Maternal Aunt    Diabetes Maternal Aunt    Diabetes Maternal Aunt    Diabetes Maternal Aunt    Diabetes Paternal Aunt    Colon polyps Neg Hx    Colon cancer Neg Hx    Esophageal cancer Neg Hx    Stomach cancer Neg Hx    Rectal cancer Neg Hx     Social History Social History   Tobacco Use   Smoking status: Former    Packs/day: 0.25    Years: 25.00    Additional pack years: 0.00    Total pack years: 6.25    Types: Cigarettes    Quit date: 10/02/2019    Years since quitting: 2.8   Smokeless tobacco: Never  Vaping Use   Vaping Use: Never used  Substance Use Topics   Alcohol use: Not Currently    Alcohol/week: 0.0 standard drinks of alcohol    Comment: socially   Drug use:  No     Allergies   Doxycycline   Review of Systems Review of Systems  Constitutional:  Negative for chills, fatigue and fever.  Respiratory:  Negative for cough, chest tightness and shortness of breath.   Cardiovascular:  Negative for chest pain.  Gastrointestinal:  Negative for diarrhea, nausea and vomiting.  Skin:  Positive for wound.       Patient reports abscess to right lower abdomen and right inner thigh.  Neurological:  Negative for dizziness and headaches.     Physical Exam Triage Vital Signs ED Triage Vitals [08/24/22 1506]  Enc Vitals Group     BP 112/80     Pulse Rate 89     Resp 18     Temp 98.3 F (36.8 C)     Temp Source Oral     SpO2 95 %     Weight      Height      Head Circumference      Peak Flow      Pain Score      Pain Loc      Pain Edu?      Excl. in GC?    No data found.  Updated Vital Signs BP 112/80 (BP Location: Left Arm)   Pulse 89   Temp 98.3 F (36.8 C) (Oral)   Resp 18   LMP 11/26/2014 Comment: negative preg test   SpO2 95%   Visual Acuity Right Eye Distance:   Left Eye Distance:   Bilateral Distance:    Right Eye Near:   Left Eye Near:    Bilateral Near:     Physical Exam Constitutional:      General: She is not in acute distress.    Appearance: She is obese.  HENT:     Head: Normocephalic.  Cardiovascular:     Rate and Rhythm: Normal rate and regular rhythm.     Heart sounds: Normal heart sounds.  Pulmonary:     Effort: Pulmonary effort is normal.     Breath  sounds: Normal breath sounds.  Abdominal:     General: Abdomen is flat.     Palpations: Abdomen is soft.     Tenderness: There is no abdominal tenderness.  Skin:    General: Skin is warm and dry.     Comments: She has a 2 cm open abscess noted underneath the right pannus.  She has a 1.5 cm nonfluctuant abscess noted of the proximal right thigh close to the groin.  No evidence of surrounding cellulitis.  Neurological:     Mental Status: She is alert and  oriented to person, place, and time.      UC Treatments / Results    Initial Impression / Assessment and Plan / UC Course  I have reviewed the triage vital signs and the nursing notes.  Pertinent labs & imaging results that were available during my care of the patient were reviewed by me and considered in my medical decision making (see chart for details).  52 year old female with 1 month history of recurrent boils.  Her exam is consistent with hidradenitis suppurativa.  Advised her to keep these areas clean and dry.  Wash with warm water and soap, pat dry.  Cover the open abscess with Neosporin and a Band-Aid and change daily.  Rx for Septra DS 1 tab p.o. twice daily x 10 days.  She will follow-up with her PCP to discuss referral to dermatology for further evaluation.  Final Clinical Impressions(s) / UC Diagnoses   Final diagnoses:  Hidradenitis suppurativa of multiple sites     Discharge Instructions      You were seen today for multiple boils in your abdominal fold and right thigh.  This is a condition called hidradenitis suppurativa.  I am putting you on antibiotics 2 times daily for the next 10 days.  Wash the areas with warm water and soap, pat dry and place Neosporin over the open area and cover with a Band-Aid.  Change this daily.  Please follow with your PCP to discuss referral to dermatology if symptoms persist or worsen.     ED Prescriptions     Medication Sig Dispense Auth. Provider   sulfamethoxazole-trimethoprim (BACTRIM DS) 800-160 MG tablet Take 1 tablet by mouth 2 (two) times daily for 7 days. 20 tablet Lorre Munroe, NP      PDMP not reviewed this encounter. Nicki Reaper, NP    Lorre Munroe, [place] 08/24/22 [phone]

## 2022-08-24 NOTE — Discharge Instructions (Signed)
You were seen today for multiple boils in your abdominal fold and right thigh.  This is a condition called hidradenitis suppurativa.  I am putting you on antibiotics 2 times daily for the next 10 days.  Wash the areas with warm water and soap, pat dry and place Neosporin over the open area and cover with a Band-Aid.  Change this daily.  Please follow with your PCP to discuss referral to dermatology if symptoms persist or worsen.

## 2022-08-24 NOTE — ED Triage Notes (Signed)
Pt is here for bumps on her abdomen x 2-3 months.  Pt is a diabetic.

## 2022-09-17 ENCOUNTER — Telehealth: Payer: Self-pay | Admitting: Internal Medicine

## 2022-09-17 DIAGNOSIS — L732 Hidradenitis suppurativa: Secondary | ICD-10-CM

## 2022-09-17 MED ORDER — LEVEMIR FLEXPEN 100 UNIT/ML ~~LOC~~ SOPN: 30.0000 [IU] | PEN_INJECTOR | Freq: Two times a day (BID) | SUBCUTANEOUS | 3 refills | Status: DC

## 2022-09-17 MED ORDER — NOVOLOG FLEXPEN 100 UNIT/ML ~~LOC~~ SOPN: PEN_INJECTOR | SUBCUTANEOUS | 0 refills | Status: DC

## 2022-09-17 NOTE — Telephone Encounter (Signed)
LMTRC  JMiller,RMA 

## 2022-09-17 NOTE — Telephone Encounter (Signed)
Patient advising that she is having issues getting her medication and the her insulin is not keeping her BGL regulated. She is wanting to speak to a clinical staff member.Marland Kitchen

## 2022-09-17 NOTE — Addendum Note (Signed)
Addended by: Lisabeth Pick on: 09/17/2022 02:24 PM   Modules accepted: Orders

## 2022-09-17 NOTE — Telephone Encounter (Signed)
Referral Request - Has patient seen PCP for this complaint? No.  *If NO, is insurance requiring patient see PCP for this issue before PCP can refer them? Patient does not know  Referral for which specialty: Dermatologist  Preferred provider/office: Patient does not have a specific one.  Reason for referral: did not want to disclose this information to me, patient just stated this requires a dermatologist  Patients callback # [phone]

## 2022-09-17 NOTE — Telephone Encounter (Signed)
 Patient has been advised and verbalized understanding.

## 2022-09-17 NOTE — Telephone Encounter (Signed)
Patient had her A1c done at her kidney doctor and it was 15.5. She has not been checking her BS. She has not been able to get the Trulicity and contact pharmacy to see what they have. She is unable to make appointment next week and would like to have some short acting insulin sent in. Patient has been taking the  Levemir 22 units BID. Please advise

## 2022-09-17 NOTE — Telephone Encounter (Signed)
Patient has been notified and medication has been sent to [facility]. Patient states that they have the 1.5mg , 4.5 mg dose of Trulicity and they have all the doses of Ozempic.  Please advise on which to send in. Patient has been placed on waiting list for cancellation

## 2022-09-18 NOTE — Telephone Encounter (Signed)
Spoke with patient . Patient refused to disclosed the need for Dermatologist  referral. Patient voiced that I do not want to talk to any nurse, just have my doctor call me back.

## 2022-09-18 NOTE — Addendum Note (Signed)
Addended by: Jonah Blue B on: 09/18/2022 04:35 PM   Modules accepted: Orders

## 2022-09-23 ENCOUNTER — Ambulatory Visit: Payer: Medicaid Other | Admitting: Internal Medicine

## 2022-10-16 ENCOUNTER — Encounter: Payer: Self-pay | Admitting: Internal Medicine

## 2022-10-16 ENCOUNTER — Ambulatory Visit: Payer: Medicaid Other | Attending: Internal Medicine | Admitting: Internal Medicine

## 2022-10-16 VITALS — BP 113/80 | HR 90 | Ht 64.0 in | Wt 245.0 lb

## 2022-10-16 DIAGNOSIS — E1122 Type 2 diabetes mellitus with diabetic chronic kidney disease: Secondary | ICD-10-CM

## 2022-10-16 DIAGNOSIS — N183 Chronic kidney disease, stage 3 unspecified: Secondary | ICD-10-CM

## 2022-10-16 DIAGNOSIS — Z794 Long term (current) use of insulin: Secondary | ICD-10-CM | POA: Diagnosis not present

## 2022-10-16 DIAGNOSIS — Z7985 Long-term (current) use of injectable non-insulin antidiabetic drugs: Secondary | ICD-10-CM

## 2022-10-16 DIAGNOSIS — E1169 Type 2 diabetes mellitus with other specified complication: Secondary | ICD-10-CM | POA: Diagnosis not present

## 2022-10-16 DIAGNOSIS — E785 Hyperlipidemia, unspecified: Secondary | ICD-10-CM | POA: Diagnosis not present

## 2022-10-16 DIAGNOSIS — Z0000 Encounter for general adult medical examination without abnormal findings: Secondary | ICD-10-CM | POA: Diagnosis not present

## 2022-10-16 DIAGNOSIS — R569 Unspecified convulsions: Secondary | ICD-10-CM | POA: Diagnosis not present

## 2022-10-16 DIAGNOSIS — Z2821 Immunization not carried out because of patient refusal: Secondary | ICD-10-CM

## 2022-10-16 DIAGNOSIS — L732 Hidradenitis suppurativa: Secondary | ICD-10-CM | POA: Diagnosis not present

## 2022-10-16 LAB — POCT GLYCOSYLATED HEMOGLOBIN (HGB A1C): HbA1c, POC (controlled diabetic range): 14.1 % — AB (ref 0.0–7.0)

## 2022-10-16 MED ORDER — ATORVASTATIN CALCIUM 20 MG PO TABS: 10.0000 mg | ORAL_TABLET | Freq: Every day | ORAL | 3 refills | Status: DC

## 2022-10-16 NOTE — Progress Notes (Signed)
Patient ID: Rebecca Johnston, female    DOB: 1970-11-04  MRN: [id number]  CC: Annual Exam (Physical. /No questions / concerns/No to shingles vax)   Subjective: Rebecca Johnston is a 52 y.o. female who presents for physical Her concerns today include:  DM with neuropathy, obesity, CKD 3, former tob dep, partial SZ, ACD, Vit D def, migraine w/o aura.    HM: Up-to-date with mammogram.  Declines influenza and Shingrix vaccine.  Reports having had eye exam done within the last year at [facility].  DM: Results for orders placed or performed in visit on 10/16/22  POCT glycosylated hemoglobin (Hb A1C)  Result Value Ref Range   Hemoglobin A1C     HbA1c POC (<> result, manual entry)     HbA1c, POC (prediabetic range)     HbA1c, POC (controlled diabetic range) 14.1 (A) 0.0 - 7.0 %  Followed by endocrinologist Dr. Brooks Sailors.  Last seen 03/2022.  A1c at that time was 7.8. Currently on Levemir insulin 30 units twice a day.  I see NovoLog on her med list but she tells me that was prescribed for only a short period and she is currently not taking.  On Trulicity 3 mg once a week but had been out of it for 2 months due to [place] wide shortage.  Just restarted 1 to 2 weeks ago. Has Dexcom device.  Review of app on her phone shows that blood sugars have been very high 96% of the times over the past 2 weeks with average blood sugars around 348 -Reports eating habits have been up-and-down.  She has cut out some stuff like sodas and white starches. Not getting in much exercise outside of work.  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 Should be on atorvastatin for hyperlipidemia.  It was increased to 20 mg by her endocrinologist.  However patient states she has not been taking because the higher dose caused muscle cramps. CKD 3: Saw nephrologist in June.  GFR at that time was 44.  Seizures: She has had only 1 seizure and nothing since then.  Not on any seizure  medication.  Reports issue with recurrent boils in the axilla, under the abdominal fold and buttocks x 6 months.  Seen at urgent care in June and was diagnosed with hidradenitis.  Patient had called in requesting referral to dermatologist.  Referral submitted.  She has an appointment coming up in September. Patient Active Problem List   Diagnosis Date Noted   Type 2 diabetes mellitus with hyperglycemia, with long-term current use of insulin (HCC) 04/11/2021   Type 2 diabetes mellitus with stage 3b chronic kidney disease, with long-term current use of insulin (HCC) 04/11/2021   DKA (diabetic ketoacidosis) (HCC) 09/27/2020   DKA, type 2 (HCC) 09/27/2020   Acute-on-chronic kidney injury (HCC) 09/27/2020   Visual complaint 09/27/2020   History of COVID-19 09/27/2020   Sleep apnea 04/17/2020   Seizure (HCC) 04/17/2020   Chronic migraine w/o aura w/o status migrainosus, not intractable 04/17/2020   Migraine 10/11/2019   Hyperlipidemia associated with type 2 diabetes mellitus (HCC) 05/11/2019   Elevated blood pressure reading 05/09/2019   Anemia due to stage 3 chronic kidney disease (HCC) 07/27/2018   Anemia of chronic disease 06/17/2018   Vitamin D deficiency 06/15/2018   Class 3 severe obesity due to excess calories with serious comorbidity and body mass index (BMI) of 40.0 to 44.9 in adult [facility]) 06/15/2018   Seizure disorder (HCC) 05/14/2018  GERD (gastroesophageal reflux disease) 05/07/2018   CKD (chronic kidney disease), stage III (HCC) 04/16/2017   Neuropathy 04/04/2016   Seasonal allergic rhinitis 12/21/2015   Type II diabetes mellitus with renal manifestations (HCC) 12/21/2015   Incarcerated hernia s/p lap repair w mesh 12/08/2014 12/08/2014   Tobacco dependence 10/21/2013   Abnormal CT scan, chest 09/02/2013     Current Outpatient Medications on File Prior to Visit  Medication Sig Dispense Refill   Accu-Chek Softclix Lancets lancets Use to check blood sugar once daily. 100 each 2    blood glucose meter kit and supplies Dispense based on patient and insurance preference. Use up to four times daily as directed. (FOR ICD-10 E10.9, E11.9). 1 each 0   cetirizine (ZYRTEC) 10 MG tablet Take 1 tablet (10 mg total) by mouth daily. 30 tablet 2   Continuous Blood Gluc Sensor (DEXCOM G7 SENSOR) MISC 1 Device by Does not apply route as directed. 9 each 3   Dulaglutide (TRULICITY) 3 MG/0.5ML SOPN Inject 3 mg as directed once a week. 6 mL 3   fluticasone (FLONASE) 50 MCG/ACT nasal spray USE 1 SPRAY(S) IN EACH NOSTRIL ONCE DAILY AS NEEDED FOR ALLERGIES OR  RHINITIS 16 g 2   glucose blood (ACCU-CHEK AVIVA PLUS) test strip Use to check blood sugar once daily. 100 each 2   insulin aspart (NOVOLOG FLEXPEN) 100 UNIT/ML FlexPen 10 units  before each meal and extra 5 units if blood sugars are over 300 3 mL 0   insulin detemir (LEVEMIR FLEXPEN) 100 UNIT/ML FlexPen Inject 30 Units into the skin 2 (two) times daily. 45 mL 3   Insulin Pen Needle 32G X 4 MM MISC 1 Device by Does not apply route 2 (two) times daily. 200 each 3   pantoprazole (PROTONIX) 40 MG tablet Take 1 tablet (40 mg total) by mouth daily. 90 tablet 1   SUMAtriptan (IMITREX) 50 MG tablet May repeat in 2 hours if headache persists or recurs. 12 tablet 11   No current facility-administered medications on file prior to visit.    Allergies  Allergen Reactions   Doxycycline     "bad dreaming, hallucination, dizziness" per pt    Social History   Socioeconomic History   Marital status: Single    Spouse name: Not on file   Number of children: Not on file   Years of education: Not on file   Highest education level: Not on file  Occupational History   Not on file  Tobacco Use   Smoking status: Former    Current packs/day: 0.00    Average packs/day: 0.3 packs/day for 25.0 years (6.3 ttl pk-yrs)    Types: Cigarettes    Start date: 10/02/1994    Quit date: 10/02/2019    Years since quitting: 3.0   Smokeless tobacco: Never   Vaping Use   Vaping status: Never Used  Substance and Sexual Activity   Alcohol use: Not Currently    Alcohol/week: 0.0 standard drinks of alcohol    Comment: socially   Drug use: No   Sexual activity: Not Currently    Birth control/protection: None  Other Topics Concern   Not on file  Social History Narrative   Lives with daughter   Right Handed   Drinks 1 cup caffeine daily   Social Determinants of Health   Financial Resource Strain: Not on file  Food Insecurity: Not on file  Transportation Needs: Not on file  Physical Activity: Not on file  Stress: Not on file  Social Connections: Not on file  Intimate Partner Violence: Not on file    Family History  Problem Relation Age of Onset   Hypertension Mother    Diabetes Father    Breast cancer Maternal Aunt    Diabetes Maternal Aunt    Diabetes Maternal Aunt    Diabetes Maternal Aunt    Diabetes Paternal Aunt    Colon polyps Neg Hx    Colon cancer Neg Hx    Esophageal cancer Neg Hx    Stomach cancer Neg Hx    Rectal cancer Neg Hx     Past Surgical History:  Procedure Laterality Date   ABDOMINAL HYSTERECTOMY N/A 12/08/2014   Procedure: HYSTERECTOMY ABDOMINAL;  Surgeon: Adam Phenix, MD;  Location: [facility];  Service: Gynecology;  Laterality: N/A;   DILATION AND CURETTAGE OF UTERUS     DILITATION & CURRETTAGE/HYSTROSCOPY WITH VERSAPOINT RESECTION N/A 01/06/2014   Procedure: Riley Kill WITH VERSAPOINT;  Surgeon: Adam Phenix, MD;  Location: [facility];  Service: Gynecology;  Laterality: N/A;   LAPAROSCOPIC LYSIS OF ADHESIONS N/A 12/08/2014   Procedure: LAPAROSCOPIC LYSIS OF ADHESIONS;  Surgeon: Karie Soda, MD;  Location: [facility];  Service: General;  Laterality: N/A;   OVARIAN CYST REMOVAL     cyst not removed. Cyst drained   SUPRA-UMBILICAL HERNIA N/A 12/08/2014   Procedure: LAPARSCOPIC SUPRA-UMBILICAL HERNIA REPAIR ;  Surgeon: Karie Soda, MD;  Location: [facility];  Service: General;  Laterality: N/A;   VENTRAL HERNIA  REPAIR N/A 12/08/2014   Procedure: LAPAROSCOPIC INCARCERATED INCISIONAL VENTRAL WALL HERNIA REPAIR;  Surgeon: Karie Soda, MD;  Location: [facility];  Service: General;  Laterality: N/A;    ROS: Review of Systems  Constitutional:  Negative for activity change.  HENT:  Negative for hearing loss and sore throat.   Eyes:  Negative for visual disturbance.  Respiratory:  Negative for cough and shortness of breath.   Cardiovascular:  Negative for chest pain.   Negative except as stated above  PHYSICAL EXAM: BP 113/80 (BP Location: Left Arm, Patient Position: Sitting, Cuff Size: Normal)   Pulse 90   Ht 5\' 4"  (1.626 m)   Wt 245 lb (111.1 kg)   LMP 11/26/2014 Comment: negative preg test   SpO2 97%   BMI 42.05 kg/m   Wt Readings from Last 3 Encounters:  10/16/22 245 lb (111.1 kg)  03/18/22 269 lb (122 kg)  10/16/21 254 lb (115.2 kg)    Physical Exam  General appearance - alert, well appearing, middle-aged African-American female and in no distress Mental status - normal mood, behavior, speech, dress, motor activity, and thought processes Eyes - pupils equal and reactive, extraocular eye movements intact Ears -she has moderate amount of hard wax in the right ear canal obstructing view of the tympanic membrane and most of the canal.  Left ear canal and tympanic membrane within normal limits Nose - normal and patent, no erythema, discharge or polyps Mouth - mucous membranes moist, pharynx normal without lesions Neck - supple, no significant adenopathy.  No thyroid enlargement or thyroid nodules. Chest-clear to auscultation, no wheezes, rales or rhonchi, symmetric air entry Heart - normal rate, regular rhythm, normal S1, S2, no murmurs, rubs, clicks or gallops Musculoskeletal - no joint tenderness, deformity or swelling Extremities - peripheral pulses normal, no pedal edema, no clubbing or cyanosis Skin: She is noted to have moderate amount of scarring under both axilla right greater than  left.  No abscess seen at this time. Diabetic Foot Exam - Simple  Simple Foot Form Diabetic Foot exam was performed with the following findings: Yes 10/16/2022  5:33 PM  Visual Inspection See comments: Yes Sensation Testing Intact to touch and monofilament testing bilaterally: Yes Pulse Check Posterior Tibialis and Dorsalis pulse intact bilaterally: Yes Comments Small nonulcerative callus on the medial aspect of both big toes, noninflamed bunions of both big toes.        10/16/2022    3:35 PM 01/11/2021    3:43 PM 11/26/2020    3:36 PM  Depression screen PHQ 2/9  Decreased Interest 0 0 0  Down, Depressed, Hopeless 0 0 0  PHQ - 2 Score 0 0 0  Altered sleeping 0 0   Tired, decreased energy 0 0   Change in appetite 0 0   Feeling bad or failure about yourself  0 0   Trouble concentrating 0 0   Moving slowly or fidgety/restless 0 0   Suicidal thoughts 0 0   PHQ-9 Score 0 0         Latest Ref Rng & Units 03/18/2022    8:57 AM 09/28/2020    3:00 AM 09/27/2020    6:45 PM  CMP  Glucose 70 - 99 mg/dL 295  284  132   BUN 6 - 23 mg/dL 24  18  18    Creatinine 0.40 - 1.20 mg/dL 4.40  1.02  7.25   Sodium 135 - 145 mEq/L 136  133  136   Potassium 3.5 - 5.1 mEq/L 4.3  3.4  3.8   Chloride 96 - 112 mEq/L 104  103  103   CO2 19 - 32 mEq/L 23  21  23    Calcium 8.4 - 10.5 mg/dL 9.9  9.2  9.4    Lipid Panel     Component Value Date/Time   CHOL 178 03/18/2022 0857   CHOL 189 05/09/2019 1153   TRIG 147.0 03/18/2022 0857   HDL 45.40 03/18/2022 0857   HDL 47 05/09/2019 1153   CHOLHDL 4 03/18/2022 0857   VLDL 29.4 03/18/2022 0857   LDLCALC 103 (H) 03/18/2022 0857   LDLCALC 119 (H) 05/09/2019 1153    CBC    Component Value Date/Time   WBC 7.4 09/27/2020 1027   RBC 6.98 (H) 09/27/2020 1027   HGB 18.0 (H) 09/27/2020 1332   HGB 12.9 05/09/2019 1153   HCT 53.0 (H) 09/27/2020 1332   HCT 40.5 05/09/2019 1153   PLT 277 09/27/2020 1027   PLT 303 05/09/2019 1153   MCV 69.3 (L)  09/27/2020 1027   MCV 69 (L) 05/09/2019 1153   MCH 21.2 (L) 09/27/2020 1027   MCHC 30.6 09/27/2020 1027   RDW 17.3 (H) 09/27/2020 1027   RDW 15.5 (H) 05/09/2019 1153   LYMPHSABS 1.4 05/17/2018 0640   MONOABS 0.7 05/17/2018 0640   EOSABS 0.2 05/17/2018 0640   BASOSABS 0.0 05/17/2018 0640    ASSESSMENT AND PLAN:  1. Annual physical exam   2. Type 2 diabetes mellitus with stage 3 chronic kidney disease, with long-term current use of insulin, unspecified whether stage 3a or 3b CKD (HCC) Not at goal.  She had been out of Trulicity 3 mg for 2 months and just restarted 1 to 2 weeks ago.  Currently on Levemir 30 units twice a day.  Recommend increase to 35 units twice a day.  Keep appointment with endocrinologist tomorrow.  Likely will need to restart NovoLog for mealtime coverage. Discussed on encourage healthy eating habits.  She declines referral to nutritionist stating that  she has seen one before and did not find it helpful. Encouraged her to move more with the goal of getting in at least 30 minutes of moderate intensity exercise 3 to 5 days a week. - POCT glycosylated hemoglobin (Hb A1C) - POCT glucose (manual entry) - CBC; Future - Hepatic Function Panel; Future  3. Insulin long-term use (HCC) 4. Long-term (current) use of injectable non-insulin antidiabetic drugs See #2 above.  5. Morbid obesity (HCC) See #2 above.  6. Hyperlipidemia associated with type 2 diabetes mellitus (HCC) Recommend restarting atorvastatin 20 mg but taking half a tablet daily.  Let me know if she still gets muscle spasms at which time we can try her with a different statin.  7. Hidradenitis Currently she does not have any active drainage.  Keep upcoming appointment with dermatology  8. Seizure [facility]) Had only 1 episode several years ago without recurrence.  Not on seizure medicine.  9. Influenza vaccination declined   Patient was given the opportunity to ask questions.  Patient verbalized  understanding of the plan and was able to repeat key elements of the plan.   This documentation was completed using Paediatric nurse.  Any transcriptional errors are unintentional.  Orders Placed This Encounter  Procedures   CBC   Hepatic Function Panel   POCT glycosylated hemoglobin (Hb A1C)   POCT glucose (manual entry)     Requested Prescriptions   Signed Prescriptions Disp Refills   atorvastatin (LIPITOR) 20 MG tablet 45 tablet 3    Sig: Take 0.5 tablets (10 mg total) by mouth daily.    Return in about 4 months (around 02/15/2023) for give lab appt for this week or next week.  Jonah Blue, MD, FACP

## 2022-10-16 NOTE — Patient Instructions (Signed)
Increase Levemir insulin to 35 units twice a day. Speak with your endocrinologist tomorrow about changing to Ozempic. Take the atorvastatin 20 mg half a tablet daily. Please remember to return to the lab to have blood test done as discussed today.

## 2022-10-17 ENCOUNTER — Other Ambulatory Visit: Payer: Self-pay

## 2022-10-17 ENCOUNTER — Encounter: Payer: Self-pay | Admitting: Internal Medicine

## 2022-10-17 ENCOUNTER — Ambulatory Visit: Payer: Medicaid Other | Admitting: Internal Medicine

## 2022-10-17 VITALS — BP 130/90 | HR 103 | Ht 64.0 in | Wt 245.4 lb

## 2022-10-17 DIAGNOSIS — E1165 Type 2 diabetes mellitus with hyperglycemia: Secondary | ICD-10-CM

## 2022-10-17 DIAGNOSIS — E785 Hyperlipidemia, unspecified: Secondary | ICD-10-CM | POA: Diagnosis not present

## 2022-10-17 DIAGNOSIS — Z794 Long term (current) use of insulin: Secondary | ICD-10-CM | POA: Diagnosis not present

## 2022-10-17 DIAGNOSIS — E1122 Type 2 diabetes mellitus with diabetic chronic kidney disease: Secondary | ICD-10-CM | POA: Diagnosis not present

## 2022-10-17 DIAGNOSIS — N183 Chronic kidney disease, stage 3 unspecified: Secondary | ICD-10-CM | POA: Diagnosis not present

## 2022-10-17 MED ORDER — NOVOLOG FLEXPEN 100 UNIT/ML ~~LOC~~ SOPN: PEN_INJECTOR | SUBCUTANEOUS | 3 refills | Status: AC

## 2022-10-17 MED ORDER — NOVOLOG FLEXPEN 100 UNIT/ML ~~LOC~~ SOPN: PEN_INJECTOR | SUBCUTANEOUS | 3 refills | Status: DC

## 2022-10-17 MED ORDER — LANTUS SOLOSTAR 100 UNIT/ML ~~LOC~~ SOPN: 50.0000 [IU] | PEN_INJECTOR | Freq: Every day | SUBCUTANEOUS | 4 refills | Status: DC

## 2022-10-17 MED ORDER — INSULIN PEN NEEDLE 32G X 4 MM MISC: 1.0000 | Freq: Four times a day (QID) | 3 refills | Status: DC

## 2022-10-17 MED ORDER — SEMAGLUTIDE (1 MG/DOSE) 4 MG/3ML ~~LOC~~ SOPN: 1.0000 mg | PEN_INJECTOR | SUBCUTANEOUS | 3 refills | Status: DC

## 2022-10-17 NOTE — Patient Instructions (Addendum)
Switch Trulicity to Ozempic 1 mg weekly Switch Levemir to Lantus 50 units once daily (or you can take 25 units twice daily) Take NovoLog 6 units before each meal Novolog correctional insulin: ADD extra units on insulin to your meal-time Novolog dose if your blood sugars are higher than 160. Use the scale below to help guide you:   Blood sugar before meal Number of units to inject  Less than 160 0 unit  161 -  190 1 units  191 -  220 2 units  221 -  250 3 units  251 -  280 4 units  281 -  310 5 units  311 -  340 6 units  341 -  370 7 units  371 -  400 8 units        HOW TO TREAT LOW BLOOD SUGARS (Blood sugar LESS THAN 70 MG/DL) Please follow the RULE OF 15 for the treatment of hypoglycemia treatment (when your (blood sugars are less than 70 mg/dL)   STEP 1: Take 15 grams of carbohydrates when your blood sugar is low, which includes:  3-4 GLUCOSE TABS  OR 3-4 OZ OF JUICE OR REGULAR SODA OR ONE TUBE OF GLUCOSE GEL    STEP 2: RECHECK blood sugar in 15 MINUTES STEP 3: If your blood sugar is still low at the 15 minute recheck --> then, go back to STEP 1 and treat AGAIN with another 15 grams of carbohydrates.

## 2022-10-17 NOTE — Progress Notes (Signed)
Name: Rebecca Johnston  MRN/ DOB: [id number], 07-Jan-1971   Age/ Sex: 52 y.o., female    PCP: Marcine Matar, MD   Reason for Endocrinology Evaluation: Type 2 Diabetes Mellitus     Date of Initial Endocrinology Visit: 04/10/2021    PATIENT IDENTIFIER: Rebecca Johnston is a 52 y.o. female with a past medical history of T2DM, Migraine headaches, T2DM and seizure disorcer . The patient presented for initial endocrinology clinic visit on 04/10/2021 for consultative assistance with her diabetes management.    HPI: [name] was    Diagnosed with DM 2013 Prior Medications tried/Intolerance: insulin started 08/2020.  Trulicity started 11/2020             Hemoglobin A1c has ranged from 6.8% in 2018, peaking at 14.9% in 2022. Patient has required hospitalization :08/2020 with DKA  On her initial visit to our clinic she had an A1c 8.2%, she was on levemir and Trulicity . She declined increasing Trulicity on her visit with me    She was approved for dexcom 10/2021 SUBJECTIVE:   During the last visit (10/16/2021): A1c >15.0 %     Today (10/17/22): [name] is here for a follow up on diabetes management. She stopped using the dexcom  2 days ago as the sensor fell off but somehow she did not have the app downloaded on her phone today ,so we were unable to download any data    Denies nausea, vomiting  Denies constipation or diarrhea recently  Has noted polydipsia and polyuria   Has noted leg pains that she att    HOME DIABETES REGIMEN: Trulicity 3 mg daiy  Levemir 30 units twice daily  Novolog 10 units TIDQAC Correction factor: Novolog (BG-130/30) Atorvastatin 20 mg daily- not taking      Statin: yes ACE-I/ARB: no   METER DOWNLOAD SUMMARY: Did not bring   DIABETIC COMPLICATIONS: Microvascular complications:  CKD III  Denies: neuropathy,  Last eye exam: Completed 07/2020  Macrovascular complications:   Denies: CAD, PVD, CVA   PAST HISTORY: Past Medical  History:  Past Medical History:  Diagnosis Date   Abnormal CT scan, chest 09/02/2013   CTa 04/18/13  1. Study limited by body habitus with no pulmonary embolism  detected.  2. Multiple borderline and a few mildly enlarged nonspecific  mediastinal and hilar lymph nodes. Multiple pulmonary nodules  bilaterally the largest measuring 8 mm. If the patient is at high  risk for bronchogenic carcinoma, follow-up chest CT at 3-70months is  recommended.  - 12/15/13  1. Interval resolution of th   Anemia    Anemia due to stage 3 chronic kidney disease (HCC) 07/27/2018   Anemia of chronic disease 06/17/2018   06/2018   Asthma    CKD (chronic kidney disease), stage III (HCC) 04/16/2017   Class 3 severe obesity due to excess calories with serious comorbidity and body mass index (BMI) of 40.0 to 44.9 in adult [facility]) 06/15/2018   Diabetes mellitus without complication (HCC) 12/2015   Elevated blood pressure reading 05/09/2019   Fibroids    GERD (gastroesophageal reflux disease)    History of blood transfusion 04/2013   Hyperlipidemia    Hyperlipidemia associated with type 2 diabetes mellitus (HCC) 05/11/2019   Incarcerated hernia s/p lap repair w mesh 12/08/2014 12/08/2014   Neuropathy 04/04/2016   Obesity, Class III, BMI 40-49.9 (morbid obesity) (HCC)    Seasonal allergic rhinitis 12/21/2015   Seizure disorder (HCC) 05/14/2018   Shortness of breath dyspnea  Sleep apnea    Tobacco dependence 10/21/2013   Type II diabetes mellitus with renal manifestations (HCC) 12/21/2015   Vitamin D deficiency 06/15/2018   Past Surgical History:  Past Surgical History:  Procedure Laterality Date   ABDOMINAL HYSTERECTOMY N/A 12/08/2014   Procedure: HYSTERECTOMY ABDOMINAL;  Surgeon: Adam Phenix, MD;  Location: [facility];  Service: Gynecology;  Laterality: N/A;   DILATION AND CURETTAGE OF UTERUS     DILITATION & CURRETTAGE/HYSTROSCOPY WITH VERSAPOINT RESECTION N/A 01/06/2014   Procedure: Riley Kill WITH VERSAPOINT;   Surgeon: Adam Phenix, MD;  Location: [facility];  Service: Gynecology;  Laterality: N/A;   LAPAROSCOPIC LYSIS OF ADHESIONS N/A 12/08/2014   Procedure: LAPAROSCOPIC LYSIS OF ADHESIONS;  Surgeon: Karie Soda, MD;  Location: [facility];  Service: General;  Laterality: N/A;   OVARIAN CYST REMOVAL     cyst not removed. Cyst drained   SUPRA-UMBILICAL HERNIA N/A 12/08/2014   Procedure: LAPARSCOPIC SUPRA-UMBILICAL HERNIA REPAIR ;  Surgeon: Karie Soda, MD;  [facility];  Service: General;  Laterality: N/A;   VENTRAL HERNIA REPAIR N/A 12/08/2014   Procedure: LAPAROSCOPIC INCARCERATED INCISIONAL VENTRAL WALL HERNIA REPAIR;  Surgeon: Karie Soda, MD;  [facility];  Service: General;  Laterality: N/A;    Social History:  reports that she quit smoking about 3 years ago. Her smoking use included cigarettes. She started smoking about 28 years ago. She has a 6.3 pack-year smoking history. She has never used smokeless tobacco. She reports that she does not currently use alcohol. She reports that she does not use drugs. Family History:  Family History  Problem Relation Age of Onset   Hypertension Mother    Diabetes Father    Breast cancer Maternal Aunt    Diabetes Maternal Aunt    Diabetes Maternal Aunt    Diabetes Maternal Aunt    Diabetes Paternal Aunt    Colon polyps Neg Hx    Colon cancer Neg Hx    Esophageal cancer Neg Hx    Stomach cancer Neg Hx    Rectal cancer Neg Hx      HOME MEDICATIONS: Allergies as of 10/17/2022       Reactions   Doxycycline    "bad dreaming, hallucination, dizziness" per pt        Medication List        Accurate as of October 17, 2022 11:57 AM. If you have any questions, ask your nurse or doctor.          Accu-Chek Aviva Plus test strip Generic drug: glucose blood Use to check blood sugar once daily.   Accu-Chek Softclix Lancets lancets Use to check blood sugar once daily.   atorvastatin 20 MG tablet Commonly known as: LIPITOR Take 0.5 tablets  (10 mg total) by mouth daily.   blood glucose meter kit and supplies Dispense based on patient and insurance preference. Use up to four times daily as directed. (FOR ICD-10 E10.9, E11.9).   cetirizine 10 MG tablet Commonly known as: ZYRTEC Take 1 tablet (10 mg total) by mouth daily.   Dexcom G7 Sensor Misc 1 Device by Does not apply route as directed.   fluticasone 50 MCG/ACT nasal spray Commonly known as: FLONASE USE 1 SPRAY(S) IN EACH NOSTRIL ONCE DAILY AS NEEDED FOR ALLERGIES OR  RHINITIS   Insulin Pen Needle 32G X 4 MM Misc 1 Device by Does not apply route 2 (two) times daily.   Levemir FlexPen 100 UNIT/ML FlexPen Generic drug: insulin detemir Inject 30 Units into  the skin 2 (two) times daily.   NovoLOG FlexPen 100 UNIT/ML FlexPen Generic drug: insulin aspart 10 units  before each meal and extra 5 units if blood sugars are over 300   pantoprazole 40 MG tablet Commonly known as: PROTONIX Take 1 tablet (40 mg total) by mouth daily.   SUMAtriptan 50 MG tablet Commonly known as: Imitrex May repeat in 2 hours if headache persists or recurs.   Trulicity 3 MG/0.5ML Sopn Generic drug: Dulaglutide Inject 3 mg as directed once a week.         ALLERGIES: Allergies  Allergen Reactions   Doxycycline     "bad dreaming, hallucination, dizziness" per pt     REVIEW OF SYSTEMS: A comprehensive ROS was conducted with the patient and is negative except as per HPI     OBJECTIVE:   VITAL SIGNS: BP (!) 130/90   Pulse (!) 103   Ht 5\' 4"  (1.626 m)   Wt 245 lb 6.4 oz (111.3 kg)   LMP 11/26/2014 Comment: negative preg test   SpO2 99%   BMI 42.12 kg/m    PHYSICAL EXAM:  General: Pt appears well and is in NAD  Lungs: Clear with good BS bilat   Heart: RRR   Abdomen: soft, nontender  Extremities:  Lower extremities - No pretibial edema.   Neuro: MS is good with appropriate affect, pt is alert and Ox3    DM Foot Exam 10/17/2022  The skin of the feet is intact  without sores or ulcerations. The pedal pulses are 2+ on right and 2+ on left. The sensation is intact to a screening 5.07, 10 gram monofilament bilaterally    DATA REVIEWED:  Lab Results  Component Value Date   HGBA1C 14.1 (A) 10/16/2022   HGBA1C 7.8 (A) 03/18/2022   HGBA1C >15.0 10/16/2021    Latest Reference Range & Units 10/16/22 15:37  HbA1c, POC (controlled diabetic range) 0.0 - 7.0 % 14.1 !    Latest Reference Range & Units 03/18/22 08:57 08/06/22 00:00  Sodium 135 - 145 mEq/L 136   Potassium 3.5 - 5.1 mEq/L 4.3   Chloride 96 - 112 mEq/L 104   CO2 19 - 32 mEq/L 23   Glucose 70 - 99 mg/dL 782 (H)   BUN 6 - 23 mg/dL 24 (H)   Creatinine 9.56 - 1.20 mg/dL 2.13 (H)   Calcium 8.4 - 10.5 mg/dL 9.9   eGFR   08.6 (E)  (H): Data is abnormally high (E): External lab result   ASSESSMENT / PLAN / RECOMMENDATIONS:   1) Type 2 Diabetes Mellitus, Poorly Controlled, With CKD III complications - Most recent A1c of 14.1 %. Goal A1c <7.0%.    -Her A1c has trended up from 7.8% to 14.1% -Patient attributes hyperglycemia due to lack of Trulicity over 2 months. -Emphasized the importance of low-carb diet and avoiding sugar sweetened beverages -I will switch Trulicity to Ozempic due to shortage of supplies -I will switch Levemir to Lantus, I have advised the patient that she could take Lantus all-in-one dose but she is concerned that it is too much insulin, patient could divide the dose if needed -She will be provided with a standing dose of prandial insulin with each meal plus a correction scale as below - Patient is not a candidate for SGLT2 inhibitors due to history of DKA    MEDICATIONS: Stop Trulicity Start Ozempic 1 mg weekly Stop Levemir Start Lantus 50 units daily Start NovoLog 6 units 3 times daily before every  meal Start CF: NovoLog(BG -130/30) TIDQAC  EDUCATION / INSTRUCTIONS: BG monitoring instructions: Patient is instructed to check her blood sugars 1 times a day,  fasting. Call [facility] if: BG persistently < 70  I reviewed the Rule of 15 for the treatment of hypoglycemia in detail with the patient. Literature supplied.   2) Diabetic complications:  Eye: Does not have known diabetic retinopathy.  Neuro/ Feet: Does not have known diabetic peripheral neuropathy. Renal: Patient does  have known baseline CKD. She is not on an ACEI/ARB at present.  3) Dyslipidemia:   - lipid panel today shows LDL above goal  -She does not want to take statins due to leg pain    Follow-up in 2 months   Signed electronically by: Lyndle Herrlich, MD  [facility] Endocrinology  [facility] [address] [place] [place], [place] [address] Phone: [phone] FAX: [phone]   CC: Marcine Matar, MD [address] [place] [address] [place] [address] Phone: [phone]  Fax: [phone]    Return to Endocrinology clinic as below: Future Appointments  Date Time Provider Department Center  02/20/2023  9:50 AM Marcine Matar, MD [id number] None

## 2022-10-20 ENCOUNTER — Other Ambulatory Visit (HOSPITAL_COMMUNITY): Payer: Self-pay

## 2022-10-20 ENCOUNTER — Telehealth: Payer: Self-pay

## 2022-10-20 NOTE — Telephone Encounter (Signed)
Pharmacy Patient Advocate Encounter   Received notification from CoverMyMeds that prior authorization for Ozempic is required/requested.   Insurance verification completed.   The patient is insured through [facility] .   Per test claim: PA required; PA submitted to [facility] via CoverMyMeds Key/confirmation #/EOC [id number] Status is pending  PA has been APPROVED from 10/20/22 to 10/20/23

## 2022-10-22 NOTE — Telephone Encounter (Signed)
Error

## 2022-11-21 DIAGNOSIS — L02429 Furuncle of limb, unspecified: Secondary | ICD-10-CM | POA: Diagnosis not present

## 2022-11-23 ENCOUNTER — Other Ambulatory Visit: Payer: Self-pay | Admitting: Internal Medicine

## 2022-12-05 DIAGNOSIS — E1122 Type 2 diabetes mellitus with diabetic chronic kidney disease: Secondary | ICD-10-CM | POA: Diagnosis not present

## 2022-12-05 DIAGNOSIS — Z6841 Body Mass Index (BMI) 40.0 and over, adult: Secondary | ICD-10-CM | POA: Diagnosis not present

## 2022-12-05 DIAGNOSIS — Z862 Personal history of diseases of the blood and blood-forming organs and certain disorders involving the immune mechanism: Secondary | ICD-10-CM | POA: Diagnosis not present

## 2022-12-05 DIAGNOSIS — N183 Chronic kidney disease, stage 3 unspecified: Secondary | ICD-10-CM | POA: Diagnosis not present

## 2022-12-06 LAB — LAB REPORT - SCANNED
Albumin, Urine POC: 12.3
Creatinine, POC: 109.5 mg/dL
Microalb Creat Ratio: 11

## 2022-12-10 ENCOUNTER — Encounter: Payer: Self-pay | Admitting: Internal Medicine

## 2022-12-10 ENCOUNTER — Ambulatory Visit: Payer: Medicaid Other | Admitting: Internal Medicine

## 2022-12-10 VITALS — BP 136/80 | HR 95 | Ht 64.0 in | Wt 256.0 lb

## 2022-12-10 DIAGNOSIS — E1165 Type 2 diabetes mellitus with hyperglycemia: Secondary | ICD-10-CM | POA: Diagnosis not present

## 2022-12-10 DIAGNOSIS — E1122 Type 2 diabetes mellitus with diabetic chronic kidney disease: Secondary | ICD-10-CM | POA: Diagnosis not present

## 2022-12-10 DIAGNOSIS — Z794 Long term (current) use of insulin: Secondary | ICD-10-CM | POA: Diagnosis not present

## 2022-12-10 DIAGNOSIS — N183 Chronic kidney disease, stage 3 unspecified: Secondary | ICD-10-CM

## 2022-12-10 LAB — POCT GLYCOSYLATED HEMOGLOBIN (HGB A1C): Hemoglobin A1C: 7.7 % — AB (ref 4.0–5.6)

## 2022-12-10 MED ORDER — INSULIN PEN NEEDLE 32G X 4 MM MISC: 1.0000 | Freq: Four times a day (QID) | 3 refills | Status: DC

## 2022-12-10 MED ORDER — LANTUS SOLOSTAR 100 UNIT/ML ~~LOC~~ SOPN: 46.0000 [IU] | PEN_INJECTOR | Freq: Every day | SUBCUTANEOUS | 4 refills | Status: DC

## 2022-12-10 MED ORDER — SEMAGLUTIDE (1 MG/DOSE) 4 MG/3ML ~~LOC~~ SOPN: 1.0000 mg | PEN_INJECTOR | SUBCUTANEOUS | 3 refills | Status: DC

## 2022-12-10 NOTE — Progress Notes (Signed)
Name: Rebecca Johnston  MRN/ DOB: [id number], 1970/07/20   Age/ Sex: 52 y.o., female    PCP: Marcine Matar, MD   Reason for Endocrinology Evaluation: Type 2 Diabetes Mellitus     Date of Initial Endocrinology Visit: 04/10/2021    PATIENT IDENTIFIER: Rebecca Johnston is a 52 y.o. female with a past medical history of T2DM, Migraine headaches, T2DM and seizure disorcer . The patient presented for initial endocrinology clinic visit on 04/10/2021 for consultative assistance with her diabetes management.    HPI: Rebecca Johnston was    Diagnosed with DM 2013 Prior Medications tried/Intolerance: insulin started 08/2020.  Trulicity started 11/2020             Hemoglobin A1c has ranged from 6.8% in 2018, peaking at 14.9% in 2022. Patient has required hospitalization :08/2020 with DKA  On her initial visit to our clinic she had an A1c 8.2%, she was on levemir and Trulicity . She declined increasing Trulicity on her visit with me    She was approved for dexcom 10/2021 SUBJECTIVE:   During the last visit (10/17/2022): A1c 14.1 %     Today (12/10/22): Rebecca Johnston is here for a follow up on diabetes management. She checks glucose multipel times a day through CGM, she is symptomatic with these episides    Denies nausea, vomiting  Denies constipation or diarrhea Has noted polydipsia and polyuria      HOME DIABETES REGIMEN: Ozempic 1 mg weekly Lantus 50 units daily NovoLog 6 units 3 times daily before every meal CF: NovoLog(BG -130/30) TIDQAC      Statin: yes ACE-I/ARB: no   CONTINUOUS GLUCOSE MONITORING RECORD INTERPRETATION    Dates of Recording: 9/26 - 12/10/2022  Sensor description: Dexcom  Results statistics:   CGM use % of time 57  Average and SD 147/34  Time in range    83    %  % Time Above 180 16  % Time above 250 1  % Time Below target 0   Glycemic patterns summary: BGs are optimal overnight and most of the day with fluctuation at the end of the  day  Hyperglycemic episodes postprandial  Hypoglycemic episodes occurred N/A  Overnight periods: Optimal   DIABETIC COMPLICATIONS: Microvascular complications:  CKD III  Denies: neuropathy,  Last eye exam: Completed 07/2020  Macrovascular complications:   Denies: CAD, PVD, CVA   PAST HISTORY: Past Medical History:  Past Medical History:  Diagnosis Date   Abnormal CT scan, chest 09/02/2013   CTa 04/18/13  1. Study limited by body habitus with no pulmonary embolism  detected.  2. Multiple borderline and a few mildly enlarged nonspecific  mediastinal and hilar lymph nodes. Multiple pulmonary nodules  bilaterally the largest measuring 8 mm. If the patient is at high  risk for bronchogenic carcinoma, follow-up chest CT at 3-23months is  recommended.  - 12/15/13  1. Interval resolution of th   Anemia    Anemia due to stage 3 chronic kidney disease (HCC) 07/27/2018   Anemia of chronic disease 06/17/2018   06/2018   Asthma    CKD (chronic kidney disease), stage III (HCC) 04/16/2017   Class 3 severe obesity due to excess calories with serious comorbidity and body mass index (BMI) of 40.0 to 44.9 in adult [facility]) 06/15/2018   Diabetes mellitus without complication (HCC) 12/2015   Elevated blood pressure reading 05/09/2019   Fibroids    GERD (gastroesophageal reflux disease)    History of blood transfusion 04/2013  Hyperlipidemia    Hyperlipidemia associated with type 2 diabetes mellitus (HCC) 05/11/2019   Incarcerated hernia s/p lap repair w mesh 12/08/2014 12/08/2014   Neuropathy 04/04/2016   Obesity, Class III, BMI 40-49.9 (morbid obesity) (HCC)    Seasonal allergic rhinitis 12/21/2015   Seizure disorder (HCC) 05/14/2018   Shortness of breath dyspnea    Sleep apnea    Tobacco dependence 10/21/2013   Type II diabetes mellitus with renal manifestations (HCC) 12/21/2015   Vitamin D deficiency 06/15/2018   Past Surgical History:  Past Surgical History:  Procedure Laterality Date    ABDOMINAL HYSTERECTOMY N/A 12/08/2014   Procedure: HYSTERECTOMY ABDOMINAL;  Surgeon: Adam Phenix, MD;  [facility];  Service: Gynecology;  Laterality: N/A;   DILATION AND CURETTAGE OF UTERUS     DILITATION & CURRETTAGE/HYSTROSCOPY WITH VERSAPOINT RESECTION N/A 01/06/2014   Procedure: Riley Kill WITH VERSAPOINT;  Surgeon: Adam Phenix, MD;  Location: [facility];  Service: Gynecology;  Laterality: N/A;   LAPAROSCOPIC LYSIS OF ADHESIONS N/A 12/08/2014   Procedure: LAPAROSCOPIC LYSIS OF ADHESIONS;  Surgeon: Karie Soda, MD;  [facility]: [facility];  Service: General;  Laterality: N/A;   OVARIAN CYST REMOVAL     cyst not removed. Cyst drained   SUPRA-UMBILICAL HERNIA N/A 12/08/2014   Procedure: LAPARSCOPIC SUPRA-UMBILICAL HERNIA REPAIR ;  Surgeon: Karie Soda, MD;  [facility];  Service: General;  Laterality: N/A;   VENTRAL HERNIA REPAIR N/A 12/08/2014   Procedure: LAPAROSCOPIC INCARCERATED INCISIONAL VENTRAL WALL HERNIA REPAIR;  Surgeon: Karie Soda, MD;  [facility];  Service: General;  Laterality: N/A;    Social History:  reports that she quit smoking about 3 years ago. Her smoking use included cigarettes. She started smoking about 28 years ago. She has a 6.3 pack-year smoking history. She has never used smokeless tobacco. She reports that she does not currently use alcohol. She reports that she does not use drugs. Family History:  Family History  Problem Relation Age of Onset   Hypertension Mother    Diabetes Father    Breast cancer Maternal Aunt    Diabetes Maternal Aunt    Diabetes Maternal Aunt    Diabetes Maternal Aunt    Diabetes Paternal Aunt    Colon polyps Neg Hx    Colon cancer Neg Hx    Esophageal cancer Neg Hx    Stomach cancer Neg Hx    Rectal cancer Neg Hx      HOME MEDICATIONS: Allergies as of 12/10/2022       Reactions   Doxycycline    "bad dreaming, hallucination, dizziness" per pt        Medication List        Accurate as of December 10, 2022  8:31  AM. If you have any questions, ask your nurse or doctor.          Accu-Chek Aviva Plus test strip Generic drug: glucose blood Use to check blood sugar once daily.   Accu-Chek Softclix Lancets lancets Use to check blood sugar once daily.   atorvastatin 20 MG tablet Commonly known as: LIPITOR Take 0.5 tablets (10 mg total) by mouth daily.   blood glucose meter kit and supplies Dispense based on patient and insurance preference. Use up to four times daily as directed. (FOR ICD-10 E10.9, E11.9).   cetirizine 10 MG tablet Commonly known as: ZYRTEC Take 1 tablet (10 mg total) by mouth daily.   Dexcom G7 Sensor Misc USE AS DIRECTED   fluticasone 50 MCG/ACT nasal spray  Commonly known as: FLONASE USE 1 SPRAY(S) IN EACH NOSTRIL ONCE DAILY AS NEEDED FOR ALLERGIES OR  RHINITIS   Insulin Pen Needle 32G X 4 MM Misc 1 Device by Does not apply route in the morning, at noon, in the evening, and at bedtime.   Lantus SoloStar 100 UNIT/ML Solostar Pen Generic drug: insulin glargine Inject 50 Units into the skin daily.   NovoLOG FlexPen 100 UNIT/ML FlexPen Generic drug: insulin aspart Take NovoLog 6 units before each meal Novolog correctional insulin: ADD extra units on insulin to your meal-time Novolog dose if your blood sugars are higher than 160. Use the scale below to help guide you:  Blood sugar before meal Number of units to inject Less than 160 0 unit 161 -  190 1 units 191 -  220 2 units 221 -  250 3 units 251 -  280 4 units 281 -  310 5 units 311 -  340 6 units 341 -  370 7 units 371 -  400 8 units   pantoprazole 40 MG tablet Commonly known as: PROTONIX Take 1 tablet (40 mg total) by mouth daily.   Semaglutide (1 MG/DOSE) 4 MG/3ML Sopn Inject 1 mg as directed once a week.   SUMAtriptan 50 MG tablet Commonly known as: Imitrex May repeat in 2 hours if headache persists or recurs.         ALLERGIES: Allergies  Allergen Reactions   Doxycycline     "bad dreaming,  hallucination, dizziness" per pt     REVIEW OF SYSTEMS: A comprehensive ROS was conducted with the patient and is negative except as per HPI     OBJECTIVE:   VITAL SIGNS: BP 136/80 (BP Location: Left Arm, Patient Position: Sitting, Cuff Size: Large)   Pulse 95   Ht 5\' 4"  (1.626 m)   Wt 256 lb (116.1 kg)   LMP 11/26/2014 Comment: negative preg test   SpO2 99%   BMI 43.94 kg/m    PHYSICAL EXAM:  General: Pt appears well and is in NAD  Lungs: Clear with good BS bilat   Heart: RRR   Abdomen: soft, nontender  Extremities:  Lower extremities - No pretibial edema.   Neuro: MS is good with appropriate affect, pt is alert and Ox3    DM Foot Exam 10/17/2022  The skin of the feet is intact without sores or ulcerations. The pedal pulses are 2+ on right and 2+ on left. The sensation is intact to a screening 5.07, 10 gram monofilament bilaterally    DATA REVIEWED:  Lab Results  Component Value Date   HGBA1C 14.1 (A) 10/16/2022   HGBA1C 7.8 (A) 03/18/2022   HGBA1C >15.0 10/16/2021    Latest Reference Range & Units 10/16/22 15:37  HbA1c, POC (controlled diabetic range) 0.0 - 7.0 % 14.1 !    Latest Reference Range & Units 03/18/22 08:57 08/06/22 00:00  Sodium 135 - 145 mEq/L 136   Potassium 3.5 - 5.1 mEq/L 4.3   Chloride 96 - 112 mEq/L 104   CO2 19 - 32 mEq/L 23   Glucose 70 - 99 mg/dL 132 (H)   BUN 6 - 23 mg/dL 24 (H)   Creatinine 4.40 - 1.20 mg/dL 1.02 (H)   Calcium 8.4 - 10.5 mg/dL 9.9   eGFR   72.5 (E)     ASSESSMENT / PLAN / RECOMMENDATIONS:   1) Type 2 Diabetes Mellitus, Sub-optimally  Controlled, With CKD III complications - Most recent A1c of 14.1 %. Goal A1c <7.0%.    -  A1c has trended down from 14.1% to 7.7% -The patient has not been using NovoLog on a regular basis due to hypoglycemia, I will remove standing dose of NovoLog and she will continue to use NovoLog per correction scale before each meal if needed -I will also reduce her basal insulin due to tight  BG's - Patient is not a candidate for SGLT2 inhibitors due to history of DKA    MEDICATIONS:  Continue Ozempic 1 mg weekly Decrease Lantus 46 units daily Continue CF: NovoLog(BG -130/30) TIDQAC  EDUCATION / INSTRUCTIONS: BG monitoring instructions: Patient is instructed to check her blood sugars 1 times a day, fasting. Call [place] [facility] if: BG persistently < 70  I reviewed the Rule of 15 for the treatment of hypoglycemia in detail with the patient. Literature supplied.   2) Diabetic complications:  Eye: Does not have known diabetic retinopathy.  Neuro/ Feet: Does not have known diabetic peripheral neuropathy. Renal: Patient does  have known baseline CKD. She is not on an ACEI/ARB at present.  3) Dyslipidemia:   - lipid panel today shows LDL above goal  -She does not want to take statins due to leg pain    Follow-up in 4 months   Signed electronically by: Lyndle Herrlich, MD  [facility]  [facility] [address] [place] [place], [place] [address] Phone: [phone] FAX: [phone]   CC: Marcine Matar, MD [address] [place] [address] Phone: [phone]  Fax: [phone]    Return to Endocrinology clinic as below: Future Appointments  Date Time Provider Department Center  02/20/2023  9:50 AM Marcine Matar, MD [id number] None

## 2022-12-10 NOTE — Patient Instructions (Signed)
Continue Ozempic 1 mg weekly Decrease  Lantus 46 units once daily  Novolog :Use the scale below to help guide you before each meal   Blood sugar before meal Number of units to inject  Less than 160 0 unit  161 -  190 1 units  191 -  220 2 units  221 -  250 3 units  251 -  280 4 units  281 -  310 5 units  311 -  340 6 units  341 -  370 7 units  371 -  400 8 units        HOW TO TREAT LOW BLOOD SUGARS (Blood sugar LESS THAN 70 MG/DL) Please follow the RULE OF 15 for the treatment of hypoglycemia treatment (when your (blood sugars are less than 70 mg/dL)   STEP 1: Take 15 grams of carbohydrates when your blood sugar is low, which includes:  3-4 GLUCOSE TABS  OR 3-4 OZ OF JUICE OR REGULAR SODA OR ONE TUBE OF GLUCOSE GEL    STEP 2: RECHECK blood sugar in 15 MINUTES STEP 3: If your blood sugar is still low at the 15 minute recheck --> then, go back to STEP 1 and treat AGAIN with another 15 grams of carbohydrates.

## 2022-12-11 ENCOUNTER — Encounter: Payer: Self-pay | Admitting: Internal Medicine

## 2022-12-23 ENCOUNTER — Other Ambulatory Visit: Payer: Self-pay | Admitting: Internal Medicine

## 2023-02-20 ENCOUNTER — Ambulatory Visit: Payer: Medicaid Other | Admitting: Internal Medicine

## 2023-03-11 ENCOUNTER — Ambulatory Visit: Payer: Medicaid Other | Admitting: Internal Medicine

## 2023-05-04 ENCOUNTER — Other Ambulatory Visit: Payer: Self-pay | Admitting: Internal Medicine

## 2023-05-04 DIAGNOSIS — J069 Acute upper respiratory infection, unspecified: Secondary | ICD-10-CM | POA: Diagnosis not present

## 2023-05-04 DIAGNOSIS — R0981 Nasal congestion: Secondary | ICD-10-CM | POA: Diagnosis not present

## 2023-05-19 ENCOUNTER — Other Ambulatory Visit (HOSPITAL_COMMUNITY): Payer: Self-pay

## 2023-05-19 ENCOUNTER — Telehealth: Payer: Self-pay

## 2023-05-19 NOTE — Telephone Encounter (Signed)
 Pharmacy Patient Advocate Encounter  Received notification from [facility] that Prior Authorization for Dexcom G7 sensor has been DENIED.  See denial reason below. No denial letter attached in CMM. Will attach denial letter to Media tab once received.  We may be able to approve this device when we see certain records (documentation that you have had a face-to-face encounter with your ordering practitioner no more than 3 months prior to submission of the reauthorization request to evaluate the efficacy of the continuous glucose monitoring system).  PA #/Case ID/Reference #: [id number]

## 2023-05-19 NOTE — Telephone Encounter (Signed)
 Pharmacy Patient Advocate Encounter   Received notification from CoverMyMeds that prior authorization for Dexcom G7 sensor is required/requested.   Insurance verification completed.   The patient is insured through [facility] .   Per test claim: PA required; PA submitted to above mentioned insurance via CoverMyMeds Key/confirmation #/EOC [id number] Status is pending

## 2023-06-10 DIAGNOSIS — N183 Chronic kidney disease, stage 3 unspecified: Secondary | ICD-10-CM | POA: Diagnosis not present

## 2023-06-10 DIAGNOSIS — E1122 Type 2 diabetes mellitus with diabetic chronic kidney disease: Secondary | ICD-10-CM | POA: Diagnosis not present

## 2023-06-10 DIAGNOSIS — Z862 Personal history of diseases of the blood and blood-forming organs and certain disorders involving the immune mechanism: Secondary | ICD-10-CM | POA: Diagnosis not present

## 2023-06-10 DIAGNOSIS — Z6841 Body Mass Index (BMI) 40.0 and over, adult: Secondary | ICD-10-CM | POA: Diagnosis not present

## 2023-06-11 LAB — LAB REPORT - SCANNED
Albumin, Urine POC: 26
Creatinine, POC: 157.7 mg/dL
EGFR: 41
Microalb Creat Ratio: 16

## 2023-06-17 ENCOUNTER — Other Ambulatory Visit: Payer: Self-pay | Admitting: Internal Medicine

## 2023-06-17 DIAGNOSIS — Z1231 Encounter for screening mammogram for malignant neoplasm of breast: Secondary | ICD-10-CM

## 2023-06-18 ENCOUNTER — Ambulatory Visit
Admission: RE | Admit: 2023-06-18 | Discharge: 2023-06-18 | Disposition: A | Discharge status: Home / Self Care | Source: Ambulatory Visit | Attending: Internal Medicine | Admitting: Internal Medicine

## 2023-06-18 DIAGNOSIS — Z1231 Encounter for screening mammogram for malignant neoplasm of breast: Secondary | ICD-10-CM

## 2023-06-24 ENCOUNTER — Encounter: Payer: Self-pay | Admitting: Internal Medicine

## 2023-07-14 DIAGNOSIS — R1084 Generalized abdominal pain: Secondary | ICD-10-CM | POA: Diagnosis not present

## 2023-07-14 DIAGNOSIS — M545 Low back pain, unspecified: Secondary | ICD-10-CM | POA: Diagnosis not present

## 2023-07-14 DIAGNOSIS — R110 Nausea: Secondary | ICD-10-CM | POA: Diagnosis not present

## 2023-07-16 ENCOUNTER — Emergency Department (HOSPITAL_COMMUNITY)
Admission: EM | Admit: 2023-07-16 | Discharge: 2023-07-16 | Disposition: A | Discharge status: Home / Self Care | Attending: Emergency Medicine | Admitting: Emergency Medicine

## 2023-07-16 ENCOUNTER — Emergency Department (HOSPITAL_COMMUNITY)

## 2023-07-16 ENCOUNTER — Other Ambulatory Visit: Payer: Self-pay

## 2023-07-16 DIAGNOSIS — K8020 Calculus of gallbladder without cholecystitis without obstruction: Secondary | ICD-10-CM | POA: Diagnosis not present

## 2023-07-16 DIAGNOSIS — R7401 Elevation of levels of liver transaminase levels: Secondary | ICD-10-CM | POA: Diagnosis not present

## 2023-07-16 DIAGNOSIS — R1084 Generalized abdominal pain: Secondary | ICD-10-CM

## 2023-07-16 DIAGNOSIS — Z794 Long term (current) use of insulin: Secondary | ICD-10-CM | POA: Insufficient documentation

## 2023-07-16 DIAGNOSIS — R1031 Right lower quadrant pain: Secondary | ICD-10-CM | POA: Diagnosis present

## 2023-07-16 DIAGNOSIS — K828 Other specified diseases of gallbladder: Secondary | ICD-10-CM | POA: Diagnosis not present

## 2023-07-16 DIAGNOSIS — K5900 Constipation, unspecified: Secondary | ICD-10-CM | POA: Insufficient documentation

## 2023-07-16 DIAGNOSIS — R1011 Right upper quadrant pain: Secondary | ICD-10-CM | POA: Diagnosis not present

## 2023-07-16 DIAGNOSIS — E1129 Type 2 diabetes mellitus with other diabetic kidney complication: Secondary | ICD-10-CM | POA: Diagnosis not present

## 2023-07-16 LAB — CBC WITH DIFFERENTIAL/PLATELET
Abs Immature Granulocytes: 0.04 10*3/uL (ref 0.00–0.07)
Basophils Absolute: 0 10*3/uL (ref 0.0–0.1)
Basophils Relative: 0 %
Eosinophils Absolute: 0.2 10*3/uL (ref 0.0–0.5)
Eosinophils Relative: 3 %
HCT: 46.2 % — ABNORMAL HIGH (ref 36.0–46.0)
Hemoglobin: 13.5 g/dL (ref 12.0–15.0)
Immature Granulocytes: 1 %
Lymphocytes Relative: 18 %
Lymphs Abs: 1.4 10*3/uL (ref 0.7–4.0)
MCH: 21.3 pg — ABNORMAL LOW (ref 26.0–34.0)
MCHC: 29.2 g/dL — ABNORMAL LOW (ref 30.0–36.0)
MCV: 72.9 fL — ABNORMAL LOW (ref 80.0–100.0)
Monocytes Absolute: 0.7 10*3/uL (ref 0.1–1.0)
Monocytes Relative: 9 %
Neutro Abs: 5.4 10*3/uL (ref 1.7–7.7)
Neutrophils Relative %: 69 %
Platelets: 283 10*3/uL (ref 150–400)
RBC: 6.34 MIL/uL — ABNORMAL HIGH (ref 3.87–5.11)
RDW: 15.8 % — ABNORMAL HIGH (ref 11.5–15.5)
WBC: 7.7 10*3/uL (ref 4.0–10.5)
nRBC: 0 % (ref 0.0–0.2)

## 2023-07-16 LAB — HCG, SERUM, QUALITATIVE: Preg, Serum: NEGATIVE

## 2023-07-16 LAB — COMPREHENSIVE METABOLIC PANEL WITH GFR
ALT: 29 U/L (ref 0–44)
AST: 30 U/L (ref 15–41)
Albumin: 3.6 g/dL (ref 3.5–5.0)
Alkaline Phosphatase: 149 U/L — ABNORMAL HIGH (ref 38–126)
Anion gap: 8 (ref 5–15)
BUN: 16 mg/dL (ref 6–20)
CO2: 23 mmol/L (ref 22–32)
Calcium: 9.1 mg/dL (ref 8.9–10.3)
Chloride: 101 mmol/L (ref 98–111)
Creatinine, Ser: 1.59 mg/dL — ABNORMAL HIGH (ref 0.44–1.00)
GFR, Estimated: 39 mL/min — ABNORMAL LOW (ref >60)
Glucose, Bld: 220 mg/dL — ABNORMAL HIGH (ref 70–99)
Potassium: 3.9 mmol/L (ref 3.5–5.1)
Sodium: 132 mmol/L — ABNORMAL LOW (ref 135–145)
Total Bilirubin: 0.8 mg/dL (ref 0.0–1.2)
Total Protein: 8.1 g/dL (ref 6.5–8.1)

## 2023-07-16 LAB — URINALYSIS, W/ REFLEX TO CULTURE (INFECTION SUSPECTED)
Bacteria, UA: NONE SEEN
Bilirubin Urine: NEGATIVE
Glucose, UA: NEGATIVE mg/dL
Hgb urine dipstick: NEGATIVE
Ketones, ur: NEGATIVE mg/dL
Leukocytes,Ua: NEGATIVE
Nitrite: NEGATIVE
Protein, ur: 30 mg/dL — AB
Specific Gravity, Urine: 1.02 (ref 1.005–1.030)
pH: 5 (ref 5.0–8.0)

## 2023-07-16 LAB — LIPASE, BLOOD: Lipase: 62 U/L — ABNORMAL HIGH (ref 11–51)

## 2023-07-16 MED ORDER — SODIUM CHLORIDE (PF) 0.9 % IJ SOLN
INTRAMUSCULAR | Status: AC
  Filled 2023-07-16: qty 50

## 2023-07-16 MED ORDER — GOLYTELY 236 G PO SOLR: 4000.0000 mL | Freq: Once | ORAL | 0 refills | Status: AC

## 2023-07-16 MED ORDER — SODIUM CHLORIDE 0.9 % IV BOLUS
1000.0000 mL | Freq: Once | INTRAVENOUS | Status: AC
  Administered 2023-07-16: 1000 mL via INTRAVENOUS

## 2023-07-16 MED ORDER — MORPHINE SULFATE (PF) 4 MG/ML IV SOLN
4.0000 mg | Freq: Once | INTRAVENOUS | Status: AC
  Administered 2023-07-16: 4 mg via INTRAVENOUS
  Filled 2023-07-16: qty 1

## 2023-07-16 MED ORDER — IOHEXOL 300 MG/ML  SOLN
80.0000 mL | Freq: Once | INTRAMUSCULAR | Status: AC | PRN
  Administered 2023-07-16: 80 mL via INTRAVENOUS

## 2023-07-16 NOTE — Consult Note (Signed)
 Consult Note  Rebecca Johnston 04-01-1970  [id number].    Requesting MD: Angela Kell, MD Chief Complaint/Reason for Consult: abdominal pain HPI:  Patient is a 53 year old female who presented to the ED with abdominal pain since Monday this week. Pain is cramping and across her upper abdomen and comes in waves and radiates to the back on both sides. She reports associated constipation since Saturday, normally has a BM daily. She has tried prune juice, dulcolax and miralax  without any relief. She had some mild nausea but no vomiting. She has not had similar symptoms to this previously. PMH otherwise significant for T2DM, CKD stage III, Uterine fibroids, Obesity class III, GERD, HLD, Anemia of chronic disease, Seizure disorder, OSA and Tobacco dependence. She has had prior ventral hernia repair and abdominal hysterectomy. She has been on ozempic  for her T2DM but has not taken it in several weeks. Denies fever, chills, urinary symptoms, chest pain, SOB. Not on any blood thinners. Allergy to doxycycline .   ROS: Negative other than HPI  Family History  Problem Relation Age of Onset   Hypertension Mother    Diabetes Father    Breast cancer Maternal Aunt    Diabetes Maternal Aunt    Diabetes Maternal Aunt    Diabetes Maternal Aunt    Diabetes Paternal Aunt    Colon polyps Neg Hx    Colon cancer Neg Hx    Esophageal cancer Neg Hx    Stomach cancer Neg Hx    Rectal cancer Neg Hx     Past Medical History:  Diagnosis Date   Abnormal CT scan, chest 09/02/2013   CTa 04/18/13  1. Study limited by body habitus with no pulmonary embolism  detected.  2. Multiple borderline and a few mildly enlarged nonspecific  mediastinal and hilar lymph nodes. Multiple pulmonary nodules  bilaterally the largest measuring 8 mm. If the patient is at high  risk for bronchogenic carcinoma, follow-up chest CT at 3-28months is  recommended.  - 12/15/13  1. Interval resolution of th   Anemia    Anemia due to  stage 3 chronic kidney disease (HCC) 07/27/2018   Anemia of chronic disease 06/17/2018   06/2018   Asthma    CKD (chronic kidney disease), stage III (HCC) 04/16/2017   Class 3 severe obesity due to excess calories with serious comorbidity and body mass index (BMI) of 40.0 to 44.9 in adult 06/15/2018   Diabetes mellitus without complication (HCC) 12/2015   Elevated blood pressure reading 05/09/2019   Fibroids    GERD (gastroesophageal reflux disease)    History of blood transfusion 04/2013   Hyperlipidemia    Hyperlipidemia associated with type 2 diabetes mellitus (HCC) 05/11/2019   Incarcerated hernia s/p lap repair w mesh 12/08/2014 12/08/2014   Neuropathy 04/04/2016   Obesity, Class III, BMI 40-49.9 (morbid obesity)    Seasonal allergic rhinitis 12/21/2015   Seizure disorder (HCC) 05/14/2018   Shortness of breath dyspnea    Sleep apnea    Tobacco dependence 10/21/2013   Type II diabetes mellitus with renal manifestations (HCC) 12/21/2015   Vitamin D  deficiency 06/15/2018    Past Surgical History:  Procedure Laterality Date   ABDOMINAL HYSTERECTOMY N/A 12/08/2014   Procedure: HYSTERECTOMY ABDOMINAL;  Surgeon: Tresia Fruit, MD;  Location: [facility];  Service: Gynecology;  Laterality: N/A;   DILATION AND CURETTAGE OF UTERUS     DILITATION & CURRETTAGE/HYSTROSCOPY WITH VERSAPOINT RESECTION N/A 01/06/2014   Procedure: HYSTEROCOPY WITH VERSAPOINT;  Surgeon: Tresia Fruit, MD;  Location: [facility];  Service: Gynecology;  Laterality: N/A;   LAPAROSCOPIC LYSIS OF ADHESIONS N/A 12/08/2014   Procedure: LAPAROSCOPIC LYSIS OF ADHESIONS;  Surgeon: Candyce Champagne, MD;  Location: [facility];  Service: General;  Laterality: N/A;   OVARIAN CYST REMOVAL     cyst not removed. Cyst drained   SUPRA-UMBILICAL HERNIA N/A 12/08/2014   Procedure: LAPARSCOPIC SUPRA-UMBILICAL HERNIA REPAIR ;  Surgeon: Candyce Champagne, MD;  Location: [facility];  Service: General;  Laterality: N/A;   VENTRAL HERNIA REPAIR N/A 12/08/2014    Procedure: LAPAROSCOPIC INCARCERATED INCISIONAL VENTRAL WALL HERNIA REPAIR;  Surgeon: Candyce Champagne, MD;  Location: [facility];  Service: General;  Laterality: N/A;    Social History:  reports that she quit smoking about 3 years ago. Her smoking use included cigarettes. She started smoking about 28 years ago. She has a 6.3 pack-year smoking history. She has never used smokeless tobacco. She reports that she does not currently use alcohol. She reports that she does not use drugs.  Allergies:  Allergies  Allergen Reactions   Doxycycline      "bad dreaming, hallucination, dizziness" per pt    (Not in a hospital admission)   Blood pressure 130/89, pulse 77, temperature 97.9 F (36.6 C), temperature source Oral, resp. rate 15, height 5\' 4"  (1.626 m), weight 122.5 kg, last menstrual period 11/26/2014, SpO2 99%. Physical Exam:  General: pleasant, WD, obese female who is laying in bed in NAD HEENT: head is normocephalic, atraumatic.  Sclera are anicteric.  Ears and nose without any masses or lesions.  Mouth is pink and moist Heart: regular, rate, and rhythm.  Palpable radial and pedal pulses bilaterally Lungs: No wheezes, rhonchi, or rales noted.  Respiratory effort nonlabored Abd: soft, mild ttp across upper abdomen without peritonitis, negative Murphy sign, fullness of upper abdomen, no masses, hernias, or organomegaly MS: all 4 extremities are symmetrical with no cyanosis, clubbing, or edema. Skin: warm and dry with no masses, lesions, or rashes Neuro: Cranial nerves 2-12 grossly intact, sensation is normal throughout Psych: A&Ox3 with an appropriate affect.   Results for orders placed or performed during the hospital encounter of 07/16/23 (from the past 48 hours)  Comprehensive metabolic panel     Status: Abnormal   Collection Time: 07/16/23  9:09 AM  Result Value Ref Range   Sodium 132 (L) 135 - 145 mmol/L   Potassium 3.9 3.5 - 5.1 mmol/L   Chloride 101 98 - 111 mmol/L   CO2 23 22 - 32  mmol/L   Glucose, Bld 220 (H) 70 - 99 mg/dL    Comment: Glucose reference range applies only to samples taken after fasting for at least 8 hours.   BUN 16 6 - 20 mg/dL   Creatinine, Ser 8.29 (H) 0.44 - 1.00 mg/dL   Calcium  9.1 8.9 - 10.3 mg/dL   Total Protein 8.1 6.5 - 8.1 g/dL   Albumin  3.6 3.5 - 5.0 g/dL   AST 30 15 - 41 U/L   ALT 29 0 - 44 U/L   Alkaline Phosphatase 149 (H) 38 - 126 U/L   Total Bilirubin 0.8 0.0 - 1.2 mg/dL   GFR, Estimated 39 (L) >60 mL/min    Comment: (NOTE) Calculated using the CKD-EPI Creatinine Equation (2021)    Anion gap 8 5 - 15    Comment: Performed at [facility], [address], [place], [place] [address]  Lipase, blood     Status: Abnormal   Collection  Time: 07/16/23  9:09 AM  Result Value Ref Range   Lipase 62 (H) 11 - 51 U/L    Comment: Performed at [facility], [address], [place], [place] [address]  CBC with Differential     Status: Abnormal   Collection Time: 07/16/23  9:09 AM  Result Value Ref Range   WBC 7.7 4.0 - 10.5 K/uL   RBC 6.34 (H) 3.87 - 5.11 MIL/uL   Hemoglobin 13.5 12.0 - 15.0 g/dL   HCT 81.1 (H) 91.4 - 78.2 %   MCV 72.9 (L) 80.0 - 100.0 fL   MCH 21.3 (L) 26.0 - 34.0 pg   MCHC 29.2 (L) 30.0 - 36.0 g/dL   RDW 95.6 (H) 21.3 - 08.6 %   Platelets 283 150 - 400 K/uL   nRBC 0.0 0.0 - 0.2 %   Neutrophils Relative % 69 %   Neutro Abs 5.4 1.7 - 7.7 K/uL   Lymphocytes Relative 18 %   Lymphs Abs 1.4 0.7 - 4.0 K/uL   Monocytes Relative 9 %   Monocytes Absolute 0.7 0.1 - 1.0 K/uL   Eosinophils Relative 3 %   Eosinophils Absolute 0.2 0.0 - 0.5 K/uL   Basophils Relative 0 %   Basophils Absolute 0.0 0.0 - 0.1 K/uL   Immature Granulocytes 1 %   Abs Immature Granulocytes 0.04 0.00 - 0.07 K/uL    Comment: Performed at [facility], [address], [place], [place] [address]  Urinalysis, w/ Reflex to Culture (Infection Suspected) -Urine, Clean Catch     Status: Abnormal    Collection Time: 07/16/23  9:09 AM  Result Value Ref Range   Specimen Source URINE, CLEAN CATCH    Color, Urine YELLOW YELLOW   APPearance CLEAR CLEAR   Specific Gravity, Urine 1.020 1.005 - 1.030   pH 5.0 5.0 - 8.0   Glucose, UA NEGATIVE NEGATIVE mg/dL   Hgb urine dipstick NEGATIVE NEGATIVE   Bilirubin Urine NEGATIVE NEGATIVE   Ketones, ur NEGATIVE NEGATIVE mg/dL   Protein, ur 30 (A) NEGATIVE mg/dL   Nitrite NEGATIVE NEGATIVE   Leukocytes,Ua NEGATIVE NEGATIVE   RBC / HPF 0-5 0 - 5 RBC/hpf   WBC, UA 0-5 0 - 5 WBC/hpf    Comment:        Reflex urine culture not performed if WBC <=10, OR if Squamous epithelial cells >5. If Squamous epithelial cells >5 suggest recollection.    Bacteria, UA NONE SEEN NONE SEEN   Squamous Epithelial / HPF 0-5 0 - 5 /HPF   Mucus PRESENT    Hyaline Casts, UA PRESENT     Comment: Performed at [facility], [address], [place], [place] [address]  hCG, serum, qualitative     Status: None   Collection Time: 07/16/23  9:09 AM  Result Value Ref Range   Preg, Serum NEGATIVE NEGATIVE    Comment:        THE SENSITIVITY OF THIS METHODOLOGY IS >10 mIU/mL. Performed at [facility], [address], [place], [place] [address]    US  Abdomen Limited RUQ (LIVER/GB) Result Date: 07/16/2023 CLINICAL DATA:  Right upper quadrant abdominal pain EXAM: ULTRASOUND ABDOMEN LIMITED RIGHT UPPER QUADRANT COMPARISON:  CT 07/16/2023 earlier FINDINGS: Gallbladder: Dilated gallbladder. Stone towards the neck. No Murphy's sign. No wall thickening or adjacent fluid. Please correlate with time course of symptoms. Common bile duct: Diameter: 5 mm Liver: No focal lesion identified. Within normal limits in parenchymal echogenicity. Portal vein  is patent on color Doppler imaging with normal direction of blood flow towards the liver. Other: None. IMPRESSION: Dilated gallbladder with a stone towards the neck. No further sonographic evaluation of acute  cholecystitis at this time or ductal dilatation. However please correlate clinical presentation if there is concern of early acute cholecystitis a HIDA scan could be considered as clinically appropriate. Electronically Signed   By: Adrianna Horde M.D.   On: 07/16/2023 12:37   CT ABDOMEN PELVIS W CONTRAST Result Date: 07/16/2023 CLINICAL DATA:  Abdominal pain, acute, nonlocalized EXAM: CT ABDOMEN AND PELVIS WITH CONTRAST TECHNIQUE: Multidetector CT imaging of the abdomen and pelvis was performed using the standard protocol following bolus administration of intravenous contrast. RADIATION DOSE REDUCTION: This exam was performed according to the departmental dose-optimization program which includes automated exposure control, adjustment of the mA and/or kV according to patient size and/or use of iterative reconstruction technique. CONTRAST:  80mL OMNIPAQUE  IOHEXOL  300 MG/ML  SOLN COMPARISON:  August 29, 2014, Jul 04, 2014 FINDINGS: Lower chest: No focal airspace consolidation or pleural effusion.Redemonstrated scarring or subsegmental atelectasis in the right middle lobe, anterior right lower lobe, and lingula. Hepatobiliary: No mass. Fluid-filled and distended with a 2.4 cm stone in the gallbladder neck. No wall thickening or pericholecystic inflammation. No intrahepatic or extrahepatic biliary ductal dilation. The portal veins are patent. Pancreas: No mass or main ductal dilation. No peripancreatic inflammation or fluid collection. Spleen: Normal size. No mass. Adrenals/Urinary Tract: No adrenal masses. Large regions of multifocal cortical scarring throughout both kidneys, significantly progressed in the interim. No renal mass. No nephrolithiasis or hydronephrosis. The urinary bladder is distended without focal abnormality. Stomach/Bowel: The stomach is decompressed without focal abnormality. No small bowel wall thickening or inflammation. No small bowel obstruction.Normal appendix. Scattered descending colonic  diverticulosis. Vascular/Lymphatic: No aortic aneurysm. Scattered aortoiliac atherosclerosis. Unchanged enlarged porta hepatic lymph nodes. For example, there is a portacaval lymph node measuring 2.5 cm. Upper porta hepatic lymph node measuring 1.7 cm (axial 217). 2.4 cm porta hepatic/peripancreatic lymph node (axial 237). Given the long-term stability, these are likely reactive. Reproductive: Hysterectomy. No concerning adnexal mass.No free pelvic fluid. Along the right pelvic sidewall, there is a hypodense structure measuring 4.2 x 1.8 cm, corresponding to a larger cystic structure on the prior CT (previously measuring, 6.2 x 3.9 cm). Other: No pneumoperitoneum, ascites, or mesenteric inflammation. Musculoskeletal: No acute fracture or destructive lesion. Mild rectus diastasis. Multilevel degenerative disc disease of the spine. IMPRESSION: 1. No acute intraabdominal or pelvic abnormality. 2. Fluid-filled and distended gallbladder containing a 2.4 cm stone in the gallbladder neck. No secondary findings to suggest acute cholecystitis. 3. Along the right pelvic sidewall, there is an intermediate density structure measuring 4.2 x 1.8 cm, corresponding to a larger cystic structure on the prior CT (previously, measuring 6.2 x 3.9 cm). Given the appearance on the prior ultrasound, this likely represents a persistent endometrioma or collapsed hemorrhagic cyst. 4. Multifocal cortical scarring throughout both kidneys, significantly progressed in the interim. No nephrolithiasis or hydronephrosis. Electronically Signed   By: Rance Burrows M.D.   On: 07/16/2023 11:33      Assessment/Plan Cholelithiasis  Constipation  - CT with 2.4 cm at the gallbladder neck but no pericholecystic inflammation or gallbladder wall thickening, CT read does not remark on stool burden but colon appears dilated with stool retained  - US  with stone toward the neck of the gallbladder but no signs of acute cholecystitis  - pain on exam is  across upper abdomen and  negative murphy sign, exam and history really more consistent with constipation than symptomatic cholelithiasis or cholecystitis - at this time would not recommend cholecystectomy on an urgent/emergent basis. Recommend treating constipation and then PO challenge - if patient has pain or nausea after eating once constipation is improved then can re-evaluate whether cholecystectomy needed - general surgery will not follow acutely but please re-consult if unable to tolerate PO challenge once constipation better  FEN: ok to have a diet from surgical standpoint VTE: ok to have chemical prophylaxis from a surgical standpoint ID: no current abx, no leukocytosis, afebrile  T2DM CKD stage III Uterine fibroids Obesity class III GERD HLD Anemia of chronic disease Seizure disorder OSA Tobacco dependence   I reviewed ED provider notes, last 24 h vitals and pain scores, last 48 h intake and output, last 24 h labs and trends, and last 24 h imaging results.  This care required high  level of medical decision making.   Annetta Killian, [facility] Surgery 07/16/2023, 2:00 PM Please see Amion for pager number during day hours 7:00am-4:30pm

## 2023-07-16 NOTE — ED Triage Notes (Signed)
 Pt complaining of abdominal pain that started this Monday. Describes it as cramping that radiates across her abdomen. Pt states that she has not had a bowl movement since Saturday. Pt states that is not normal for her as she goes daily. Pt has tried taking Doculax and miralax  and nothing has help.

## 2023-07-16 NOTE — Discharge Instructions (Addendum)
 Return for any problem.  Follow up with General Surgery (Dr. Afton Horse) as instructed.

## 2023-07-16 NOTE — ED Provider Notes (Signed)
 [place] EMERGENCY DEPARTMENT AT [facility] Provider Note   CSN: [id number] Arrival date & time: 07/16/23  0757     History  Chief Complaint  Patient presents with   Abdominal Pain    Rebecca Johnston is a 53 y.o. female.  53 year old female with past medical history as detailed below presents for evaluation.  Patient complains of crampy abdominal pain.  Pain is throughout her entire abdomen.  She reports gradual onset of pain since Saturday.  She reports no bowel movement since Saturday.  She denies nausea or vomiting.  She denies fever.  She reports that she is still passing flatus.  She denies prior abdominal surgery.  She denies significant prior history of constipation.  The history is provided by the patient.       Home Medications Prior to Admission medications   Medication Sig Start Date End Date Taking? Authorizing Provider  Accu-Chek Softclix Lancets lancets Use to check blood sugar once daily. Patient not taking: Reported on 10/17/2022 08/15/20   Lawrance Presume, MD  atorvastatin  (LIPITOR) 20 MG tablet Take 0.5 tablets (10 mg total) by mouth daily. 10/16/22   Lawrance Presume, MD  blood glucose meter kit and supplies Dispense based on patient and insurance preference. Use up to four times daily as directed. (FOR ICD-10 E10.9, E11.9). 05/26/18   Angiulli, Everlyn Hockey, PA-C  cetirizine  (ZYRTEC ) 10 MG tablet Take 1 tablet (10 mg total) by mouth daily. 08/15/20   Lawrance Presume, MD  Continuous Glucose Sensor (DEXCOM G7 SENSOR) MISC USE AS DIRECTED 05/05/23   Shamleffer, Ibtehal Jaralla, MD  fluticasone  (FLONASE ) 50 MCG/ACT nasal spray USE 1 SPRAY(S) IN EACH NOSTRIL ONCE DAILY AS NEEDED FOR ALLERGIES OR  RHINITIS 08/15/20   Lawrance Presume, MD  glucose blood (ACCU-CHEK AVIVA PLUS) test strip Use to check blood sugar once daily. Patient not taking: Reported on 10/17/2022 09/24/21   Lawrance Presume, MD  insulin  aspart (NOVOLOG  FLEXPEN) 100 UNIT/ML FlexPen  Take NovoLog  6 units before each meal Novolog  correctional insulin : ADD extra units on insulin  to your meal-time Novolog  dose if your blood sugars are higher than 160. Use the scale below to help guide you:  Blood sugar before meal Number of units to inject Less than 160 0 unit 161 -  190 1 units 191 -  220 2 units 221 -  250 3 units 251 -  280 4 units 281 -  310 5 units 311 -  340 6 units 341 -  370 7 units 371 -  400 8 units 10/17/22   Shamleffer, Ibtehal Jaralla, MD  insulin  glargine (LANTUS  SOLOSTAR) 100 UNIT/ML Solostar Pen Inject 46 Units into the skin daily. 12/10/22   Shamleffer, Ibtehal Jaralla, MD  Insulin  Pen Needle 32G X 4 MM MISC 1 Device by Does not apply route in the morning, at noon, in the evening, and at bedtime. 12/10/22   Shamleffer, Ibtehal Jaralla, MD  pantoprazole  (PROTONIX ) 40 MG tablet Take 1 tablet (40 mg total) by mouth daily. 09/24/21   Lawrance Presume, MD  Semaglutide , 1 MG/DOSE, 4 MG/3ML SOPN Inject 1 mg as directed once a week. 12/10/22   Shamleffer, Ibtehal Jaralla, MD  SUMAtriptan  (IMITREX ) 50 MG tablet May repeat in 2 hours if headache persists or recurs. 10/16/20   Phebe Brasil, MD      Allergies    Doxycycline     Review of Systems   Review of Systems  All other systems reviewed and are negative.  Physical Exam Updated Vital Signs BP (!) 173/118 (BP Location: Left Arm)   Pulse (!) 106   Temp 99.7 F (37.6 C) (Oral)   Resp 18   Ht 5\' 4"  (1.626 m)   Wt 122.5 kg   LMP 11/26/2014 Comment: negative preg test   SpO2 97%   BMI 46.35 kg/m  Physical Exam Vitals and nursing note reviewed.  Constitutional:      General: She is not in acute distress.    Appearance: Normal appearance. She is well-developed.  HENT:     Head: Normocephalic and atraumatic.  Eyes:     Conjunctiva/sclera: Conjunctivae normal.     Pupils: Pupils are equal, round, and reactive to light.  Cardiovascular:     Rate and Rhythm: Normal rate and regular rhythm.     Heart sounds: Normal  heart sounds.  Pulmonary:     Effort: Pulmonary effort is normal. No respiratory distress.     Breath sounds: Normal breath sounds.  Abdominal:     General: There is no distension.     Palpations: Abdomen is soft.     Tenderness: There is generalized abdominal tenderness.  Musculoskeletal:        General: No deformity. Normal range of motion.     Cervical back: Normal range of motion and neck supple.  Skin:    General: Skin is warm and dry.  Neurological:     General: No focal deficit present.     Mental Status: She is alert and oriented to person, place, and time.     ED Results / Procedures / Treatments   Labs (all labs ordered are listed, but only abnormal results are displayed) Labs Reviewed  COMPREHENSIVE METABOLIC PANEL WITH GFR  LIPASE, BLOOD  CBC WITH DIFFERENTIAL/PLATELET  URINALYSIS, W/ REFLEX TO CULTURE (INFECTION SUSPECTED)  HCG, SERUM, QUALITATIVE    EKG None  Radiology No results found.  Procedures Procedures    Medications Ordered in ED Medications  sodium chloride  0.9 % bolus 1,000 mL (has no administration in time range)    ED Course/ Medical Decision Making/ A&P                                 Medical Decision Making Amount and/or Complexity of Data Reviewed Labs: ordered. Radiology: ordered.  Risk Prescription drug management.    Medical Screen Complete  This patient presented to the ED with complaint of abdominal pain, constipation.  This complaint involves an extensive number of treatment options. The initial differential diagnosis includes, but is not limited to, constipation, obstruction, infection, metabolic abnormality, etc.  This presentation is: Acute, Chronic, Self-Limited, Previously Undiagnosed, Uncertain Prognosis, Complicated, Systemic Symptoms, and Threat to Life/Bodily Function  Patient is presenting with complaint primarily of diffuse abdominal discomfort and associated constipation.  She denies fever,  vomiting.  Screening labs obtained are reassuring with white count of 7.7, normal AST, normal ALT, normal total bili.  Alk phos is mildly elevated 149.  CT imaging of the abdomen pelvis does not reveal acute abnormality of the bowel.  However, incidental finding of large gallstone found.  Right upper quadrant ultrasound demonstrates presence of gallstones without clear evidence of cholecystitis.  Patient seen by general surgery (PA Lincoln Renshaw) who feels that patient's symptoms are most likely related to constipation versus acute biliary colic/cholecystitis/choledocholithiasis.  Patient offered admission for treatment of constipation and further observation.  Patient declines this.  She would prefer to go home.  She is interested in additional outpatient treatment for constipation.  She would prefer to be near her own bathroom if she is being treated for constipation.  Patient understands need to return to the ED if she develops worsening pain especially pain in the right lower quadrant.  She is advised to return for fever, nausea, vomiting, or other worrisome symptoms.  Strict return precautions given and understood.  Reports close follow-up stressed.  Additional history obtained:   External records from outside sources obtained and reviewed including prior ED visits and prior Inpatient records.    Problem List / ED Course:  Abdominal pain   Reevaluation:  After the interventions noted above, I reevaluated the patient and found that they have: improved   Disposition:  After consideration of the diagnostic results and the patients response to treatment, I feel that the patent would benefit from close outpatient followup.          Final Clinical Impression(s) / ED Diagnoses Final diagnoses:  Generalized abdominal pain  Constipation, unspecified constipation type  Calculus of gallbladder without cholecystitis without obstruction    Rx / DC Orders ED Discharge Orders           Ordered    polyethylene glycol (GOLYTELY ) 236 g solution   Once        07/16/23 1423              Lolitha Tortora C, MD 07/16/23 1425

## 2023-08-26 ENCOUNTER — Other Ambulatory Visit (HOSPITAL_COMMUNITY): Payer: Self-pay

## 2023-10-12 ENCOUNTER — Telehealth: Payer: Self-pay

## 2023-10-12 ENCOUNTER — Other Ambulatory Visit (HOSPITAL_COMMUNITY): Payer: Self-pay

## 2023-10-12 NOTE — Telephone Encounter (Signed)
 Pharmacy Patient Advocate Encounter  Received notification from [facility] that Prior Authorization for Ozempic  has been CANCELLED due to  This request cannot be processed due to the member's coverage has terminated

## 2023-10-13 NOTE — Telephone Encounter (Signed)
 LVMTCB

## 2023-10-15 ENCOUNTER — Telehealth: Payer: Self-pay | Admitting: Internal Medicine

## 2023-10-15 ENCOUNTER — Telehealth: Payer: Self-pay

## 2023-10-15 ENCOUNTER — Other Ambulatory Visit (HOSPITAL_COMMUNITY): Payer: Self-pay

## 2023-10-15 NOTE — Telephone Encounter (Addendum)
 Please process the PA again for the Ozempic  as patient insurance is active

## 2023-10-15 NOTE — Telephone Encounter (Signed)
 Pharmacy Patient Advocate Encounter   Received notification from Pt Calls Messages that prior authorization for Ozempic  4mg /56ml is required/requested.   Insurance verification completed.   The patient is insured through [employer] .   Per test claim: Refill too soon. PA is not needed at this time. Medication was filled 10/03/2023. Next eligible fill date is 12/03/2023.

## 2023-10-15 NOTE — Telephone Encounter (Signed)
 Pharmacy Patient Advocate Encounter   Received notification from Pt Calls Messages that prior authorization for Dexcom G7 sensor is required/requested.   Insurance verification completed.   The patient is insured through [facility] .   Per PA form: Pt must have had a face-to-face encounter within the last 6 months. I see her last office visit was October of last year. She will need to be seen before PA can be submitted

## 2023-10-15 NOTE — Telephone Encounter (Signed)
 MEDICATION: Continuous Glucose Sensor (DEXCOM G7 SENSOR) MISC   PHARMACY:  [facility] [address] [place] - [address] (Ph: [phone])   HAS THE PATIENT CONTACTED THEIR PHARMACY?  Yes  LAST REFILL:  @@LASTREFILL @  IS THIS A 90 DAY SUPPLY : Yes  IS PATIENT OUT OF MEDICATION: No  IF NOT; HOW MUCH IS LEFT: Not sure  LAST APPOINTMENT DATE: @10 /11/2022  NEXT APPOINTMENT DATE:@9 /11/2023  DO WE HAVE YOUR PERMISSION TO LEAVE A DETAILED MESSAGE?: Yes  OTHER COMMENTS: Patient states that she was told by someone that her [employer] terminated, but it is verified.   **Let patient know to contact pharmacy at the end of the day to make sure medication is ready. **  ** Please notify patient to allow 48-72 hours to process**  **Encourage patient to contact the pharmacy for refills or they can request refills through [facility]**

## 2023-10-26 ENCOUNTER — Other Ambulatory Visit (HOSPITAL_COMMUNITY): Payer: Self-pay

## 2023-11-10 ENCOUNTER — Ambulatory Visit: Admitting: Internal Medicine

## 2023-11-10 ENCOUNTER — Encounter: Payer: Self-pay | Admitting: Internal Medicine

## 2023-11-10 VITALS — BP 110/74 | HR 89 | Ht 64.0 in | Wt 264.0 lb

## 2023-11-10 DIAGNOSIS — E785 Hyperlipidemia, unspecified: Secondary | ICD-10-CM | POA: Diagnosis not present

## 2023-11-10 DIAGNOSIS — E1165 Type 2 diabetes mellitus with hyperglycemia: Secondary | ICD-10-CM

## 2023-11-10 DIAGNOSIS — Z794 Long term (current) use of insulin: Secondary | ICD-10-CM | POA: Diagnosis not present

## 2023-11-10 DIAGNOSIS — N183 Chronic kidney disease, stage 3 unspecified: Secondary | ICD-10-CM | POA: Diagnosis not present

## 2023-11-10 DIAGNOSIS — E1122 Type 2 diabetes mellitus with diabetic chronic kidney disease: Secondary | ICD-10-CM | POA: Diagnosis not present

## 2023-11-10 LAB — POCT GLUCOSE (DEVICE FOR HOME USE): POC Glucose: 121 mg/dL — AB (ref 70–99)

## 2023-11-10 LAB — POCT GLYCOSYLATED HEMOGLOBIN (HGB A1C): Hemoglobin A1C: 8.4 % — AB (ref 4.0–5.6)

## 2023-11-10 MED ORDER — SEMAGLUTIDE (2 MG/DOSE) 8 MG/3ML ~~LOC~~ SOPN: 2.0000 mg | PEN_INJECTOR | SUBCUTANEOUS | 3 refills | Status: AC

## 2023-11-10 MED ORDER — ACCU-CHEK GUIDE TEST VI STRP: 1.0000 | ORAL_STRIP | Freq: Every day | 12 refills | Status: AC

## 2023-11-10 MED ORDER — LANTUS SOLOSTAR 100 UNIT/ML ~~LOC~~ SOPN: 46.0000 [IU] | PEN_INJECTOR | Freq: Every day | SUBCUTANEOUS | 4 refills | Status: AC

## 2023-11-10 MED ORDER — ACCU-CHEK GUIDE W/DEVICE KIT: 1.0000 | PACK | Freq: Every day | 0 refills | Status: DC

## 2023-11-10 MED ORDER — DEXCOM G7 SENSOR MISC: 1.0000 | 2 refills | Status: DC

## 2023-11-10 MED ORDER — DEXCOM G7 SENSOR MISC: Status: DC

## 2023-11-10 MED ORDER — INSULIN PEN NEEDLE 32G X 4 MM MISC: 1.0000 | Freq: Four times a day (QID) | 3 refills | Status: AC

## 2023-11-10 NOTE — Patient Instructions (Addendum)
 Increase Ozempic  2 mg weekly Continue Lantus  46 units once daily  Novolog  :Use the scale below to help guide you before each meal   Blood sugar before meal Number of units to inject  Less than 160 0 unit  161 -  190 1 units  191 -  220 2 units  221 -  250 3 units  251 -  280 4 units  281 -  310 5 units  311 -  340 6 units  341 -  370 7 units  371 -  400 8 units        HOW TO TREAT LOW BLOOD SUGARS (Blood sugar LESS THAN 70 MG/DL) Please follow the RULE OF 15 for the treatment of hypoglycemia treatment (when your (blood sugars are less than 70 mg/dL)   STEP 1: Take 15 grams of carbohydrates when your blood sugar is low, which includes:  3-4 GLUCOSE TABS  OR 3-4 OZ OF JUICE OR REGULAR SODA OR ONE TUBE OF GLUCOSE GEL    STEP 2: RECHECK blood sugar in 15 MINUTES STEP 3: If your blood sugar is still low at the 15 minute recheck --> then, go back to STEP 1 and treat AGAIN with another 15 grams of carbohydrates.

## 2023-11-10 NOTE — Telephone Encounter (Signed)
 Pharmacy Patient Advocate Encounter   Received notification from Pt Calls Messages that prior authorization for Dexcom G7 sensor is required/requested.   Insurance verification completed.   The patient is insured through [employer] .   Per test claim: PA required; PA submitted to above mentioned insurance via Latent Key/confirmation #/EOC [id number] Status is pending

## 2023-11-10 NOTE — Progress Notes (Signed)
 Name: Annebelle Bostic  MRN/ DOB: [id number], 12/08/1970   Age/ Sex: 53 y.o., female    PCP: Vicci Barnie NOVAK, MD   Reason for Endocrinology Evaluation: Type 2 Diabetes Mellitus     Date of Initial Endocrinology Visit: 04/10/2021    PATIENT IDENTIFIER: Ms. Nohea Kras is a 53 y.o. female with a past medical history of T2DM, Migraine headaches, T2DM and seizure disorcer . The patient presented for initial endocrinology clinic visit on 04/10/2021 for consultative assistance with her diabetes management.     HPI: Ms. Batta was    Diagnosed with DM 2013 Prior Medications tried/Intolerance: insulin  started 08/2020.  Trulicity  started 11/2020             Hemoglobin A1c has ranged from 6.8% in 2018, peaking at 14.9% in 2022. Patient has required hospitalization :08/2020 with DKA  On her initial visit to our clinic she had an A1c 8.2%, she was on levemir  and Trulicity  . She declined increasing Trulicity  on her visit with me    She was approved for dexcom 10/2021 SUBJECTIVE:   During the last visit (12/10/2022): A1c 7.7 %     Today (11/10/23): Ms. Kulakowski is here for a follow up on diabetes management. She has NOT been to our clinic in 11 months.  She doesn't,t check glucose at home     No nausea or vomiting  Has noted constipation  She had an eye exam this summer, no evidence of DR  HOME DIABETES REGIMEN: Ozempic  1 mg weekly Lantus  46 units daily NovoLog  6 units 3 times daily before every meal CF: NovoLog (BG -130/30) TIDQAC      Statin: yes ACE-I/ARB: no   CONTINUOUS GLUCOSE MONITORING RECORD INTERPRETATION  : N/A   DIABETIC COMPLICATIONS: Microvascular complications:  CKD III  Denies: neuropathy,  Last eye exam: Completed 2025  Macrovascular complications:   Denies: CAD, PVD, CVA   PAST HISTORY: Past Medical History:  Past Medical History:  Diagnosis Date   Abnormal CT scan, chest 09/02/2013   CTa 04/18/13  1. Study limited by body habitus with no  pulmonary embolism  detected.  2. Multiple borderline and a few mildly enlarged nonspecific  mediastinal and hilar lymph nodes. Multiple pulmonary nodules  bilaterally the largest measuring 8 mm. If the patient is at high  risk for bronchogenic carcinoma, follow-up chest CT at 3-36months is  recommended.  - 12/15/13  1. Interval resolution of th   Anemia    Anemia due to stage 3 chronic kidney disease (HCC) 07/27/2018   Anemia of chronic disease 06/17/2018   06/2018   Asthma    CKD (chronic kidney disease), stage III (HCC) 04/16/2017   Class 3 severe obesity due to excess calories with serious comorbidity and body mass index (BMI) of 40.0 to 44.9 in adult 06/15/2018   Diabetes mellitus without complication (HCC) 12/2015   Elevated blood pressure reading 05/09/2019   Fibroids    GERD (gastroesophageal reflux disease)    History of blood transfusion 04/2013   Hyperlipidemia    Hyperlipidemia associated with type 2 diabetes mellitus (HCC) 05/11/2019   Incarcerated hernia s/p lap repair w mesh 12/08/2014 12/08/2014   Neuropathy 04/04/2016   Obesity, Class III, BMI 40-49.9 (morbid obesity)    Seasonal allergic rhinitis 12/21/2015   Seizure disorder (HCC) 05/14/2018   Shortness of breath dyspnea    Sleep apnea    Tobacco dependence 10/21/2013   Type II diabetes mellitus with renal manifestations (HCC) 12/21/2015   Vitamin D  deficiency  06/15/2018   Past Surgical History:  Past Surgical History:  Procedure Laterality Date   ABDOMINAL HYSTERECTOMY N/A 12/08/2014   Procedure: HYSTERECTOMY ABDOMINAL;  Surgeon: Lynwood KANDICE Solomons, MD;  Location: [facility];  Service: Gynecology;  Laterality: N/A;   DILATION AND CURETTAGE OF UTERUS     DILITATION & CURRETTAGE/HYSTROSCOPY WITH VERSAPOINT RESECTION N/A 01/06/2014   Procedure: ELWYN WITH VERSAPOINT;  Surgeon: Lynwood KANDICE Solomons, MD;  Location: [facility];  Service: Gynecology;  Laterality: N/A;   LAPAROSCOPIC LYSIS OF ADHESIONS N/A 12/08/2014   Procedure:  LAPAROSCOPIC LYSIS OF ADHESIONS;  Surgeon: Elspeth Schultze, MD;  Location: [facility];  Service: General;  Laterality: N/A;   OVARIAN CYST REMOVAL     cyst not removed. Cyst drained   SUPRA-UMBILICAL HERNIA N/A 12/08/2014   Procedure: LAPARSCOPIC SUPRA-UMBILICAL HERNIA REPAIR ;  Surgeon: Elspeth Schultze, MD;  Location: [facility];  Service: General;  Laterality: N/A;   VENTRAL HERNIA REPAIR N/A 12/08/2014   Procedure: LAPAROSCOPIC INCARCERATED INCISIONAL VENTRAL WALL HERNIA REPAIR;  Surgeon: Elspeth Schultze, MD;  Location: [facility];  Service: General;  Laterality: N/A;    Social History:  reports that she quit smoking about 4 years ago. Her smoking use included cigarettes. She started smoking about 29 years ago. She has a 6.3 pack-year smoking history. She has never used smokeless tobacco. She reports that she does not currently use alcohol. She reports that she does not use drugs. Family History:  Family History  Problem Relation Age of Onset   Hypertension Mother    Diabetes Father    Breast cancer Maternal Aunt    Diabetes Maternal Aunt    Diabetes Maternal Aunt    Diabetes Maternal Aunt    Diabetes Paternal Aunt    Colon polyps Neg Hx    Colon cancer Neg Hx    Esophageal cancer Neg Hx    Stomach cancer Neg Hx    Rectal cancer Neg Hx      HOME MEDICATIONS: Allergies as of 11/10/2023       Reactions   Doxycycline     bad dreaming, hallucination, dizziness per pt        Medication List        Accurate as of November 10, 2023  2:19 PM. If you have any questions, ask your nurse or doctor.          Accu-Chek Aviva Plus test strip Generic drug: glucose blood Use to check blood sugar once daily.   Accu-Chek Softclix Lancets lancets Use to check blood sugar once daily.   atorvastatin  20 MG tablet Commonly known as: LIPITOR Take 0.5 tablets (10 mg total) by mouth daily.   blood glucose meter kit and supplies Dispense based on patient and insurance preference. Use up to four times daily  as directed. (FOR ICD-10 E10.9, E11.9).   cetirizine  10 MG tablet Commonly known as: ZYRTEC  Take 1 tablet (10 mg total) by mouth daily.   Dexcom G7 Sensor Misc USE AS DIRECTED What changed: Another medication with the same name was added. Make sure you understand how and when to take each. Changed by: Donell PARAS Kamari Buch   Dexcom G7 Sensor Misc Change every 10 days What changed: You were already taking a medication with the same name, and this prescription was added. Make sure you understand how and when to take each. Changed by: Arion Shankles J Mitzy Naron   fluticasone  50 MCG/ACT nasal spray Commonly known as: FLONASE  USE 1 SPRAY(S) IN EACH NOSTRIL ONCE DAILY AS NEEDED FOR ALLERGIES OR  RHINITIS   Insulin  Pen Needle 32G X 4 MM Misc 1 Device by Does not apply route in the morning, at noon, in the evening, and at bedtime.   Lantus  SoloStar 100 UNIT/ML Solostar Pen Generic drug: insulin  glargine Inject 46 Units into the skin daily.   NovoLOG  FlexPen 100 UNIT/ML FlexPen Generic drug: insulin  aspart Take NovoLog  6 units before each meal Novolog  correctional insulin : ADD extra units on insulin  to your meal-time Novolog  dose if your blood sugars are higher than 160. Use the scale below to help guide you:  Blood sugar before meal Number of units to inject Less than 160 0 unit 161 -  190 1 units 191 -  220 2 units 221 -  250 3 units 251 -  280 4 units 281 -  310 5 units 311 -  340 6 units 341 -  370 7 units 371 -  400 8 units   pantoprazole  40 MG tablet Commonly known as: PROTONIX  Take 1 tablet (40 mg total) by mouth daily.   Semaglutide  (1 MG/DOSE) 4 MG/3ML Sopn Inject 1 mg as directed once a week.   SUMAtriptan  50 MG tablet Commonly known as: Imitrex  May repeat in 2 hours if headache persists or recurs.         ALLERGIES: Allergies  Allergen Reactions   Doxycycline      bad dreaming, hallucination, dizziness per pt     REVIEW OF SYSTEMS: A comprehensive ROS was conducted  with the patient and is negative except as per HPI     OBJECTIVE:   VITAL SIGNS: BP 110/74 (BP Location: Left Arm, Patient Position: Sitting, Cuff Size: Normal)   Pulse 89   Ht 5' 4 (1.626 m)   Wt 264 lb (119.7 kg)   LMP 11/26/2014 Comment: negative preg test   SpO2 97%   BMI 45.32 kg/m    PHYSICAL EXAM:  General: Pt appears well and is in NAD  Lungs: Clear with good BS bilat   Heart: RRR   Abdomen: soft, nontender  Extremities:  Lower extremities - No pretibial edema.   Neuro: MS is good with appropriate affect, pt is alert and Ox3    DM Foot Exam 11/10/2023  The skin of the feet is intact without sores or ulcerations. The pedal pulses are 2+ on right and 2+ on left. The sensation is intact to a screening 5.07, 10 gram monofilament bilaterally    DATA REVIEWED:  Lab Results  Component Value Date   HGBA1C 8.4 (A) 11/10/2023   HGBA1C 7.7 (A) 12/10/2022   HGBA1C 14.1 (A) 10/16/2022    Latest Reference Range & Units 07/16/23 09:09  Sodium 135 - 145 mmol/L 132 (L)  Potassium 3.5 - 5.1 mmol/L 3.9  Chloride 98 - 111 mmol/L 101  CO2 22 - 32 mmol/L 23  Glucose 70 - 99 mg/dL 779 (H)  BUN 6 - 20 mg/dL 16  Creatinine 9.55 - 8.99 mg/dL 8.40 (H)  Calcium  8.9 - 10.3 mg/dL 9.1  Anion gap 5 - 15  8  Alkaline Phosphatase 38 - 126 U/L 149 (H)  Albumin  3.5 - 5.0 g/dL 3.6  Lipase 11 - 51 U/L 62 (H)  AST 15 - 41 U/L 30  ALT 0 - 44 U/L 29  Total Protein 6.5 - 8.1 g/dL 8.1  Total Bilirubin 0.0 - 1.2 mg/dL 0.8  GFR, Estimated >39 mL/min 39 (L)     ASSESSMENT / PLAN / RECOMMENDATIONS:   1) Type 2 Diabetes Mellitus, poorly controlled, With  CKD III complications - Most recent A1c of 14.1 %. Goal A1c <7.0%.    -A1c continues to be above goal -Patient has not been to our clinic in 11 months, and has not been able to get the Dexcom -A prescription for Dexcom has been sent to the pharmacy -I have recommended increasing Ozempic , she initially wanted to switch to Trulicity  based on  our conversation she had with one of her eye doctor's patients?.  I did explain to the patient that I prefer for her to stay on Ozempic  at this time, if there is no clinical outcome with Ozempic , I will consider Mounjaro before consider Trulicity  for her - Patient is not a candidate for SGLT2 inhibitors due to history of DKA    MEDICATIONS:  Increase Ozempic  2 mg weekly Continue Lantus  46 units daily Continue CF: NovoLog (BG -130/30) TIDQAC  EDUCATION / INSTRUCTIONS: BG monitoring instructions: Patient is instructed to check her blood sugars 1 times a day, fasting. Call [facility] if: BG persistently < 70  I reviewed the Rule of 15 for the treatment of hypoglycemia in detail with the patient. Literature supplied.   2) Diabetic complications:  Eye: Does not have known diabetic retinopathy.  Neuro/ Feet: Does not have known diabetic peripheral neuropathy. Renal: Patient does  have known baseline CKD. She is not on an ACEI/ARB at present.  3) Dyslipidemia:   - Historically LDL has been above goal - Patient has declined statin therapy in the past and wanted to focus on lifestyle changes - I have encouraged the patient to follow low fat diet - We discussed cardiovascular benefits of statin therapy - Will recheck next visit  Follow-up in 4 months   Signed electronically by: Stefano Redgie Butts, MD  [facility] Endocrinology  [facility] [address] [place], [place] [address] Phone: [phone] FAX: [phone]   CC: Vicci Barnie NOVAK, MD [address] [place] [address] [place] [address] Phone: [phone]  Fax: [phone]    Return to Endocrinology clinic as below: No future appointments.

## 2023-11-10 NOTE — Telephone Encounter (Signed)
 Patient seen today, Can we rerun the PA for Dexcom

## 2023-11-19 ENCOUNTER — Telehealth: Payer: Self-pay

## 2023-11-19 NOTE — Telephone Encounter (Signed)
 Pharmacy Patient Advocate Encounter  Received notification from [employer] that Prior Authorization for Dexcom G7 sensor has been DENIED.  Full denial letter will be uploaded to the media tab. See denial reason below.  [employer] reviewed your [facility] G7 SENSOR request for the above-identified member,  and it is denied for the following reason: because we did not see what we need to approve the  device you asked for, (Dexcom G7 sensor). We may be able to approve this device when we  see certain records (records that you have been using the device correctly [the continuous  glucose monitoring system as prescribed]; and records that you have been able to improve  blood sugar [glycemic] control or records that you continue to use a device called an external  insulin  pump).

## 2023-11-19 NOTE — Telephone Encounter (Signed)
 Any update on PA?

## 2023-11-27 NOTE — Telephone Encounter (Signed)
 Done

## 2023-12-07 ENCOUNTER — Telehealth: Payer: Self-pay

## 2023-12-07 NOTE — Telephone Encounter (Signed)
 PA was denied again. They are still saying that her glycemic control has not improved since her last A1C is higher than the previous one.  Denial letter has been added to the pt's media tab

## 2023-12-07 NOTE — Telephone Encounter (Signed)
 Resubmit the PA for Dexcom

## 2023-12-07 NOTE — Telephone Encounter (Signed)
 PA for Dexcom G7 has been resubmitted. Key: [id number]

## 2023-12-14 DIAGNOSIS — Z6841 Body Mass Index (BMI) 40.0 and over, adult: Secondary | ICD-10-CM | POA: Diagnosis not present

## 2023-12-14 DIAGNOSIS — N1832 Chronic kidney disease, stage 3b: Secondary | ICD-10-CM | POA: Diagnosis not present

## 2023-12-14 DIAGNOSIS — E1129 Type 2 diabetes mellitus with other diabetic kidney complication: Secondary | ICD-10-CM | POA: Diagnosis not present

## 2023-12-14 DIAGNOSIS — Z862 Personal history of diseases of the blood and blood-forming organs and certain disorders involving the immune mechanism: Secondary | ICD-10-CM | POA: Diagnosis not present

## 2024-03-08 ENCOUNTER — Telehealth: Payer: Self-pay

## 2024-03-08 ENCOUNTER — Ambulatory Visit: Admitting: Family Medicine

## 2024-03-08 ENCOUNTER — Encounter: Payer: Self-pay | Admitting: Family Medicine

## 2024-03-08 VITALS — BP 126/72 | HR 97 | Temp 98.0°F | Ht 64.0 in | Wt 261.2 lb

## 2024-03-08 DIAGNOSIS — Z794 Long term (current) use of insulin: Secondary | ICD-10-CM

## 2024-03-08 DIAGNOSIS — E785 Hyperlipidemia, unspecified: Secondary | ICD-10-CM | POA: Diagnosis not present

## 2024-03-08 DIAGNOSIS — N1832 Chronic kidney disease, stage 3b: Secondary | ICD-10-CM

## 2024-03-08 DIAGNOSIS — J301 Allergic rhinitis due to pollen: Secondary | ICD-10-CM | POA: Diagnosis not present

## 2024-03-08 DIAGNOSIS — E1165 Type 2 diabetes mellitus with hyperglycemia: Secondary | ICD-10-CM | POA: Diagnosis not present

## 2024-03-08 DIAGNOSIS — E1169 Type 2 diabetes mellitus with other specified complication: Secondary | ICD-10-CM

## 2024-03-08 DIAGNOSIS — K219 Gastro-esophageal reflux disease without esophagitis: Secondary | ICD-10-CM | POA: Diagnosis not present

## 2024-03-08 DIAGNOSIS — Z7689 Persons encountering health services in other specified circumstances: Secondary | ICD-10-CM

## 2024-03-08 DIAGNOSIS — Z7985 Long-term (current) use of injectable non-insulin antidiabetic drugs: Secondary | ICD-10-CM | POA: Diagnosis not present

## 2024-03-08 DIAGNOSIS — E1122 Type 2 diabetes mellitus with diabetic chronic kidney disease: Secondary | ICD-10-CM

## 2024-03-08 LAB — CBC WITH DIFFERENTIAL/PLATELET
Basophils Absolute: 0.1 K/uL (ref 0.0–0.1)
Basophils Relative: 1 % (ref 0.0–3.0)
Eosinophils Absolute: 0.2 K/uL (ref 0.0–0.7)
Eosinophils Relative: 2.7 % (ref 0.0–5.0)
HCT: 43.5 % (ref 36.0–46.0)
Hemoglobin: 13.5 g/dL (ref 12.0–15.0)
Lymphocytes Relative: 19.4 % (ref 12.0–46.0)
Lymphs Abs: 1.5 K/uL (ref 0.7–4.0)
MCHC: 31.1 g/dL (ref 30.0–36.0)
MCV: 67.8 fl — ABNORMAL LOW (ref 78.0–100.0)
Monocytes Absolute: 0.8 K/uL (ref 0.1–1.0)
Monocytes Relative: 10.7 % (ref 3.0–12.0)
Neutro Abs: 5 K/uL (ref 1.4–7.7)
Neutrophils Relative %: 66.2 % (ref 43.0–77.0)
Platelets: 282 K/uL (ref 150.0–400.0)
RBC: 6.41 Mil/uL — ABNORMAL HIGH (ref 3.87–5.11)
RDW: 15.1 % (ref 11.5–15.5)
WBC: 7.6 K/uL (ref 4.0–10.5)

## 2024-03-08 LAB — COMPREHENSIVE METABOLIC PANEL WITH GFR
ALT: 27 U/L (ref 3–35)
AST: 26 U/L (ref 5–37)
Albumin: 4 g/dL (ref 3.5–5.2)
Alkaline Phosphatase: 172 U/L — ABNORMAL HIGH (ref 39–117)
BUN: 18 mg/dL (ref 6–23)
CO2: 24 meq/L (ref 19–32)
Calcium: 9.9 mg/dL (ref 8.4–10.5)
Chloride: 98 meq/L (ref 96–112)
Creatinine, Ser: 1.54 mg/dL — ABNORMAL HIGH (ref 0.40–1.20)
GFR: 38.08 mL/min — ABNORMAL LOW
Glucose, Bld: 333 mg/dL — ABNORMAL HIGH (ref 70–99)
Potassium: 4 meq/L (ref 3.5–5.1)
Sodium: 130 meq/L — ABNORMAL LOW (ref 135–145)
Total Bilirubin: 0.5 mg/dL (ref 0.2–1.2)
Total Protein: 8.1 g/dL (ref 6.0–8.3)

## 2024-03-08 LAB — HEMOGLOBIN A1C: Hgb A1c MFr Bld: 9.6 % — ABNORMAL HIGH (ref 4.6–6.5)

## 2024-03-08 LAB — LIPID PANEL
Cholesterol: 181 mg/dL (ref 28–200)
HDL: 41.3 mg/dL
LDL Cholesterol: 109 mg/dL — ABNORMAL HIGH (ref 10–99)
NonHDL: 139.3
Total CHOL/HDL Ratio: 4
Triglycerides: 153 mg/dL — ABNORMAL HIGH (ref 10.0–149.0)
VLDL: 30.6 mg/dL (ref 0.0–40.0)

## 2024-03-08 MED ORDER — DEXCOM G6 SENSOR MISC: 1.0000 | 6 refills | Status: DC

## 2024-03-08 MED ORDER — DEXCOM G6 TRANSMITTER MISC: 1.0000 | 6 refills | Status: DC

## 2024-03-08 MED ORDER — FLUTICASONE PROPIONATE 50 MCG/ACT NA SUSP: NASAL | 2 refills | Status: AC

## 2024-03-08 MED ORDER — CETIRIZINE HCL 10 MG PO TABS: 10.0000 mg | ORAL_TABLET | Freq: Every day | ORAL | 2 refills | Status: AC

## 2024-03-08 MED ORDER — PANTOPRAZOLE SODIUM 40 MG PO TBEC: 40.0000 mg | DELAYED_RELEASE_TABLET | Freq: Every day | ORAL | 1 refills | Status: AC

## 2024-03-08 NOTE — Telephone Encounter (Signed)
 Copied from CRM [id number]. Topic: Clinical - Prescription Issue >> Mar 08, 2024  2:59 PM Frederich PARAS wrote: Reason for CRM: DEXCOM TRANSMITERR ANS SENSOR , BOTH THE QUANTITY AND INSTRUCTIONS ARE INCORRECT , THEY NEED TO SPEAK TO SOMEONE IN REGARDS TO CHANGES OR THE CORRECT PRESCRIPTION NEEDS TO BE SENT BACK IN   CALL [phone] [facility]

## 2024-03-08 NOTE — Patient Instructions (Addendum)
 Welcome to [facility]!  Thank you for choosing us  for your Primary Care needs.   We offer in person and video appointments for your convenience. You may call our office to schedule appointments, or you may schedule appointments with me through MyChart.   The best way to get in contact with me is via MyChart message. This will get to me faster than a phone call, unless there is an emergency, then please call 911.  The lab is located downstairs in the Sports Medicine building, we also have xray available there.   We are checking labs today, will be in contact with any results that require further attention.  Follow-up with me in 6 mos for medication management, sooner if needed.

## 2024-03-08 NOTE — Progress Notes (Signed)
 "  New Patient Visit  Subjective:     Patient ID: Rebecca Johnston, female    DOB: 04-05-1970, 54 y.o.   MRN: [id number]  Chief Complaint  Patient presents with   New Patient Establishment     HPI  Discussed the use of AI scribe software for clinical note transcription with the patient, who gave verbal consent to proceed.  History of Present Illness Rebecca Johnston PAM is a 54 year old female with diabetes and chronic kidney disease who presents for follow-up and medication refills.  Diabetes mellitus management - Last hemoglobin A1c in September 2025 was 8.4, improved from 14.1 in August 2024 - Uses insulin  and Ozempic  for glycemic control - Uncertainty regarding Ozempic  dosing; currently on 2 mg but has not received the updated pen - Unable to obtain Dexcom G7 continuous glucose monitor due to insurance issues, despite prior successful use and preference over finger sticks - Insurance is active through 2026  Chronic kidney disease - Chronic kidney disease is being monitored in the context of diabetes management  Dyslipidemia and statin intolerance - Experiences muscle pain with atorvastatin , resulting in difficulty tolerating this cholesterol medication  Gastroesophageal reflux disease - Requires refill of Protonix  for acid reflux  Allergic rhinitis - Requires refill of Flonase  - Uses Zyrtec  seasonally for allergies  Weight concern - Expresses concern about weight in the context of diabetes and chronic kidney disease     ROS Per HPI  Outpatient Encounter Medications as of 03/08/2024  Medication Sig   atorvastatin  (LIPITOR) 20 MG tablet Take 0.5 tablets (10 mg total) by mouth daily.   blood glucose meter kit and supplies Dispense based on patient and insurance preference. Use up to four times daily as directed. (FOR ICD-10 E10.9, E11.9).   Blood Glucose Monitoring Suppl (ACCU-CHEK GUIDE) w/Device KIT 1 Device by Does not apply route daily in the afternoon.    Continuous Glucose Sensor (DEXCOM G6 SENSOR) MISC 1 each by Does not apply route every 14 (fourteen) days.   Continuous Glucose Transmitter (DEXCOM G6 TRANSMITTER) MISC 1 each by Does not apply route every 14 (fourteen) days.   glucose blood (ACCU-CHEK GUIDE TEST) test strip 1 each by Other route daily in the afternoon. Use as instructed   insulin  aspart (NOVOLOG  FLEXPEN) 100 UNIT/ML FlexPen Take NovoLog  6 units before each meal Novolog  correctional insulin : ADD extra units on insulin  to your meal-time Novolog  dose if your blood sugars are higher than 160. Use the scale below to help guide you:  Blood sugar before meal Number of units to inject Less than 160 0 unit 161 -  190 1 units 191 -  220 2 units 221 -  250 3 units 251 -  280 4 units 281 -  310 5 units 311 -  340 6 units 341 -  370 7 units 371 -  400 8 units   insulin  glargine (LANTUS  SOLOSTAR) 100 UNIT/ML Solostar Pen Inject 46 Units into the skin daily.   Insulin  Pen Needle 32G X 4 MM MISC 1 Device by Does not apply route in the morning, at noon, in the evening, and at bedtime.   Semaglutide , 2 MG/DOSE, 8 MG/3ML SOPN Inject 2 mg as directed once a week.   SUMAtriptan  (IMITREX ) 50 MG tablet May repeat in 2 hours if headache persists or recurs.   [DISCONTINUED] cetirizine  (ZYRTEC ) 10 MG tablet Take 1 tablet (10 mg total) by mouth daily.   [DISCONTINUED] fluticasone  (FLONASE ) 50 MCG/ACT nasal spray USE 1  SPRAY(S) IN EACH NOSTRIL ONCE DAILY AS NEEDED FOR ALLERGIES OR  RHINITIS   [DISCONTINUED] pantoprazole  (PROTONIX ) 40 MG tablet Take 1 tablet (40 mg total) by mouth daily.   Accu-Chek Softclix Lancets lancets Use to check blood sugar once daily. (Patient not taking: Reported on 03/08/2024)   cetirizine  (ZYRTEC ) 10 MG tablet Take 1 tablet (10 mg total) by mouth daily.   fluticasone  (FLONASE ) 50 MCG/ACT nasal spray USE 1 SPRAY(S) IN EACH NOSTRIL ONCE DAILY AS NEEDED FOR ALLERGIES OR  RHINITIS   pantoprazole  (PROTONIX ) 40 MG tablet Take 1 tablet (40 mg  total) by mouth daily.   [DISCONTINUED] Continuous Glucose Sensor (DEXCOM G7 SENSOR) MISC 1 Device by Other route See admin instructions. Change sensor every 10 days (Patient not taking: Reported on 03/08/2024)   No facility-administered encounter medications on file as of 03/08/2024.    Past Medical History:  Diagnosis Date   Abnormal CT scan, chest 09/02/2013   CTa 04/18/13  1. Study limited by body habitus with no pulmonary embolism  detected.  2. Multiple borderline and a few mildly enlarged nonspecific  mediastinal and hilar lymph nodes. Multiple pulmonary nodules  bilaterally the largest measuring 8 mm. If the patient is at high  risk for bronchogenic carcinoma, follow-up chest CT at 3-78months is  recommended.  - 12/15/13  1. Interval resolution of th   Anemia    Anemia due to stage 3 chronic kidney disease (HCC) 07/27/2018   Anemia of chronic disease 06/17/2018   06/2018   Asthma    CKD (chronic kidney disease), stage III (HCC) 04/16/2017   Class 3 severe obesity due to excess calories with serious comorbidity and body mass index (BMI) of 40.0 to 44.9 in adult (HCC) 06/15/2018   Diabetes mellitus without complication (HCC) 12/2015   Elevated blood pressure reading 05/09/2019   Fibroids    GERD (gastroesophageal reflux disease)    History of blood transfusion 04/2013   Hyperlipidemia    Hyperlipidemia associated with type 2 diabetes mellitus (HCC) 05/11/2019   Incarcerated hernia s/p lap repair w mesh 12/08/2014 12/08/2014   Neuropathy 04/04/2016   Obesity, Class III, BMI 40-49.9 (morbid obesity) (HCC)    Seasonal allergic rhinitis 12/21/2015   Seizure disorder (HCC) 05/14/2018   Shortness of breath dyspnea    Sleep apnea    Tobacco dependence 10/21/2013   Type II diabetes mellitus with renal manifestations (HCC) 12/21/2015   Vitamin D  deficiency 06/15/2018    Past Surgical History:  Procedure Laterality Date   ABDOMINAL HYSTERECTOMY N/A 12/08/2014   Procedure: HYSTERECTOMY  ABDOMINAL;  Surgeon: Lynwood KANDICE Solomons, MD;  Location: [facility];  Service: Gynecology;  Laterality: N/A;   DILATION AND CURETTAGE OF UTERUS     DILITATION & CURRETTAGE/HYSTROSCOPY WITH VERSAPOINT RESECTION N/A 01/06/2014   Procedure: ELWYN WITH VERSAPOINT;  Surgeon: Lynwood KANDICE Solomons, MD;  Location: [facility];  Service: Gynecology;  Laterality: N/A;   LAPAROSCOPIC LYSIS OF ADHESIONS N/A 12/08/2014   Procedure: LAPAROSCOPIC LYSIS OF ADHESIONS;  Surgeon: Elspeth Schultze, MD;  Location: [facility];  Service: General;  Laterality: N/A;   OVARIAN CYST REMOVAL     cyst not removed. Cyst drained   SUPRA-UMBILICAL HERNIA N/A 12/08/2014   Procedure: LAPARSCOPIC SUPRA-UMBILICAL HERNIA REPAIR ;  Surgeon: Elspeth Schultze, MD;  Location: [facility];  Service: General;  Laterality: N/A;   VENTRAL HERNIA REPAIR N/A 12/08/2014   Procedure: LAPAROSCOPIC INCARCERATED INCISIONAL VENTRAL WALL HERNIA REPAIR;  Surgeon: Elspeth Schultze, MD;  Location: [facility];  Service: General;  Laterality: N/A;    Family History  Problem Relation Age of Onset   Hypertension Mother    Diabetes Father    Breast cancer Maternal Aunt    Diabetes Maternal Aunt    Diabetes Maternal Aunt    Diabetes Maternal Aunt    Diabetes Paternal Aunt    Colon polyps Neg Hx    Colon cancer Neg Hx    Esophageal cancer Neg Hx    Stomach cancer Neg Hx    Rectal cancer Neg Hx     Social History   Socioeconomic History   Marital status: Single    Spouse name: Not on file   Number of children: Not on file   Years of education: Not on file   Highest education level: Not on file  Occupational History   Not on file  Tobacco Use   Smoking status: Former    Current packs/day: 0.00    Average packs/day: 0.3 packs/day for 25.0 years (6.3 ttl pk-yrs)    Types: Cigarettes    Start date: 10/02/1994    Quit date: 10/02/2019    Years since quitting: 4.4   Smokeless tobacco: Never  Vaping Use   Vaping status: Never Used  Substance and Sexual Activity   Alcohol use: Not  Currently    Alcohol/week: 0.0 standard drinks of alcohol    Comment: socially   Drug use: No   Sexual activity: Not Currently    Birth control/protection: None  Other Topics Concern   Not on file  Social History Narrative   Lives with daughter   Right Handed   Drinks 1 cup caffeine  daily   Social Drivers of Health   Tobacco Use: Medium Risk (03/08/2024)   Patient History    Smoking Tobacco Use: Former    Smokeless Tobacco Use: Never    Passive Exposure: Not on Actuary Strain: Not on file  Food Insecurity: Not on file  Transportation Needs: Not on file  Physical Activity: Not on file  Stress: Not on file  Social Connections: Not on file  Intimate Partner Violence: Not on file  Depression (PHQ2-9): Low Risk (03/08/2024)   Depression (PHQ2-9)    PHQ-2 Score: 1  Alcohol Screen: Not on file  Housing: Not on file  Utilities: Not on file  Health Literacy: Not on file       Objective:    BP 126/72   Pulse 97   Temp 98 F (36.7 C)   Ht 5' 4 (1.626 m)   Wt 261 lb 3.2 oz (118.5 kg)   LMP 11/26/2014   SpO2 96%   BMI 44.83 kg/m    Physical Exam Vitals and nursing note reviewed.  Constitutional:      General: She is not in acute distress.    Appearance: Normal appearance. She is obese.  HENT:     Head: Normocephalic and atraumatic.     Right Ear: External ear normal.     Left Ear: External ear normal.     Nose: Nose normal.     Mouth/Throat:     Mouth: Mucous membranes are moist.     Pharynx: Oropharynx is clear.  Eyes:     Extraocular Movements: Extraocular movements intact.     Pupils: Pupils are equal, round, and reactive to light.  Cardiovascular:     Rate and Rhythm: Normal rate and regular rhythm.     Pulses: Normal pulses.     Heart sounds: Normal heart sounds.  Pulmonary:  Effort: Pulmonary effort is normal. No respiratory distress.     Breath sounds: Normal breath sounds. No wheezing, rhonchi or rales.  Musculoskeletal:         General: Normal range of motion.     Cervical back: Normal range of motion.     Right lower leg: No edema.     Left lower leg: No edema.  Lymphadenopathy:     Cervical: No cervical adenopathy.  Neurological:     General: No focal deficit present.     Mental Status: She is alert and oriented to person, place, and time.  Psychiatric:        Mood and Affect: Mood normal.        Thought Content: Thought content normal.     Results for orders placed or performed in visit on 03/08/24  HgB A1c  Result Value Ref Range   Hgb A1c MFr Bld 9.6 (H) 4.6 - 6.5 %  CBC w/Diff  Result Value Ref Range   WBC 7.6 4.0 - 10.5 K/uL   RBC 6.41 (H) 3.87 - 5.11 Mil/uL   Hemoglobin 13.5 12.0 - 15.0 g/dL   HCT 56.4 63.9 - 53.9 %   MCV 67.8 Repeated and verified X2. (L) 78.0 - 100.0 fl   MCHC 31.1 30.0 - 36.0 g/dL   RDW 84.8 88.4 - 84.4 %   Platelets 282.0 150.0 - 400.0 K/uL   Neutrophils Relative % 66.2 43.0 - 77.0 %   Lymphocytes Relative 19.4 12.0 - 46.0 %   Monocytes Relative 10.7 3.0 - 12.0 %   Eosinophils Relative 2.7 0.0 - 5.0 %   Basophils Relative 1.0 0.0 - 3.0 %   Neutro Abs 5.0 1.4 - 7.7 K/uL   Lymphs Abs 1.5 0.7 - 4.0 K/uL   Monocytes Absolute 0.8 0.1 - 1.0 K/uL   Eosinophils Absolute 0.2 0.0 - 0.7 K/uL   Basophils Absolute 0.1 0.0 - 0.1 K/uL  Comp Met (CMET)  Result Value Ref Range   Sodium 130 (L) 135 - 145 mEq/L   Potassium 4.0 3.5 - 5.1 mEq/L   Chloride 98 96 - 112 mEq/L   CO2 24 19 - 32 mEq/L   Glucose, Bld 333 (H) 70 - 99 mg/dL   BUN 18 6 - 23 mg/dL   Creatinine, Ser 8.45 (H) 0.40 - 1.20 mg/dL   Total Bilirubin 0.5 0.2 - 1.2 mg/dL   Alkaline Phosphatase 172 (H) 39 - 117 U/L   AST 26 5 - 37 U/L   ALT 27 3 - 35 U/L   Total Protein 8.1 6.0 - 8.3 g/dL   Albumin  4.0 3.5 - 5.2 g/dL   GFR 61.91 (L) >39.99 mL/min   Calcium  9.9 8.4 - 10.5 mg/dL  Lipid Profile  Result Value Ref Range   Cholesterol 181 28 - 200 mg/dL   Triglycerides 846.9 (H) 10.0 - 149.0 mg/dL   HDL 58.69 >60.99  mg/dL   VLDL 69.3 0.0 - 59.9 mg/dL   LDL Cholesterol 890 (H) 10 - 99 mg/dL   Total CHOL/HDL Ratio 4    NonHDL 139.30         Assessment & Plan:   Assessment and Plan Assessment & Plan Establish Care Discussed vaccinations: flu, pneumonia, shingles. Advised against shingles vaccine with others due to potential reactions. - Consider flu and pneumonia vaccines. - Consider shingles vaccine separately from other vaccines.  Type 2 diabetes mellitus with hyperglycemia, with current long term use of insulin  Long term use noninsulin injectable Managed by  endocrinologist. Issues with Dexcom G7 due to insurance. A1c was 8.4% in September 2025. On Ozempic  1 mg, plan to increase to 2 mg. Discussed potential switch to Mounjaro if Ozempic  ineffective, considering CKD. - Sent prescription for Dexcom G6 to pharmacy. - Ordered A1c test. - Continue Ozempic  1 mg, plan to increase to 2 mg. - Discuss potential switch to Mounjaro if Ozempic  is not effective.  Stage 3b chronic kidney disease - Continue follow-up with nephrologist. - CMP today  Hyperlipidemia associated with type 2 diabetes mellitus Atorvastatin  causes muscle pain. Discussed switching to Zetia . - Prescribed Zetia  as an alternative to atorvastatin . - lipid levels today  Gastroesophageal reflux disease Managed with Protonix . Requires prescription strength. - Refilled protonix   Seasonal allergic rhinitis Managed with Flonase  and Zyrtec . Requires Flonase  refills. - Prescribed Flonase . - Advised to purchase Zyrtec  over the Johnston.       Orders Placed This Encounter  Procedures   HgB A1c   CBC w/Diff   Comp Met (CMET)   Lipid Profile     Meds ordered this encounter  Medications   fluticasone  (FLONASE ) 50 MCG/ACT nasal spray    Sig: USE 1 SPRAY(S) IN EACH NOSTRIL ONCE DAILY AS NEEDED FOR ALLERGIES OR  RHINITIS    Dispense:  16 g    Refill:  2   pantoprazole  (PROTONIX ) 40 MG tablet    Sig: Take 1 tablet (40 mg total)  by mouth daily.    Dispense:  90 tablet    Refill:  1   cetirizine  (ZYRTEC ) 10 MG tablet    Sig: Take 1 tablet (10 mg total) by mouth daily.    Dispense:  30 tablet    Refill:  2   Continuous Glucose Transmitter (DEXCOM G6 TRANSMITTER) MISC    Sig: 1 each by Does not apply route every 14 (fourteen) days.    Dispense:  2 each    Refill:  6    E11.65   Continuous Glucose Sensor (DEXCOM G6 SENSOR) MISC    Sig: 1 each by Does not apply route every 14 (fourteen) days.    Dispense:  2 each    Refill:  6    E11.65    Return in about 6 months (around 09/05/2024) for meds OV.  Corean LITTIE Ku, FNP   "

## 2024-03-09 ENCOUNTER — Ambulatory Visit: Payer: Self-pay | Admitting: Family Medicine

## 2024-03-09 DIAGNOSIS — E1169 Type 2 diabetes mellitus with other specified complication: Secondary | ICD-10-CM

## 2024-03-09 MED ORDER — EZETIMIBE 10 MG PO TABS: 10.0000 mg | ORAL_TABLET | Freq: Every day | ORAL | 1 refills | Status: AC

## 2024-03-11 MED ORDER — DEXCOM G6 TRANSMITTER MISC: 1.0000 | Freq: Every day | 0 refills | Status: DC

## 2024-03-11 MED ORDER — DEXCOM G6 RECEIVER DEVI: 1.0000 | Freq: Every day | 0 refills | Status: DC

## 2024-03-11 MED ORDER — DEXCOM G6 SENSOR MISC: 1.0000 | 3 refills | Status: DC

## 2024-03-11 NOTE — Addendum Note (Signed)
 Addended by: LENARD WILFORD RAMAN on: 03/11/2024 11:33 AM   Modules accepted: Orders

## 2024-03-11 NOTE — Telephone Encounter (Signed)
 [facility] pharmacy called today after originally calling on 03/08/2024, in regards to the incorrect information on prescription for dexcom transmitter and sensor. Stated that they have not heard anything back in regards to this. They would like a call as soon as possible.

## 2024-03-14 ENCOUNTER — Telehealth: Payer: Self-pay | Admitting: Internal Medicine

## 2024-03-14 ENCOUNTER — Other Ambulatory Visit: Payer: Self-pay

## 2024-03-14 ENCOUNTER — Telehealth: Payer: Self-pay

## 2024-03-14 DIAGNOSIS — Z7985 Long-term (current) use of injectable non-insulin antidiabetic drugs: Secondary | ICD-10-CM

## 2024-03-14 DIAGNOSIS — Z794 Long term (current) use of insulin: Secondary | ICD-10-CM

## 2024-03-14 DIAGNOSIS — E1122 Type 2 diabetes mellitus with diabetic chronic kidney disease: Secondary | ICD-10-CM

## 2024-03-14 MED ORDER — DEXCOM G6 TRANSMITTER MISC: 1.0000 | Freq: Every day | 0 refills | Status: AC

## 2024-03-14 MED ORDER — DEXCOM G6 RECEIVER DEVI: 1.0000 | Freq: Every day | 0 refills | Status: AC

## 2024-03-14 MED ORDER — DEXCOM G6 SENSOR MISC: 3 refills | Status: AC

## 2024-03-14 NOTE — Telephone Encounter (Signed)
 Patient called and cancelled the appointment for tomorrow, 03/15/2024 saying that she had to work.  Patient stated that she did not want to reschedule at this time.  Patient requested that either Dr. Sam or the clinical staff return her telephone call.  When asked the reason for the return call she only stated medical' and would not disclose any other reason.

## 2024-03-14 NOTE — Telephone Encounter (Signed)
 Patient called stating she recently had an A1C completed with her PCP on 03/08/2024 and does not feel another appointment is necessary regarding her blood sugars. She reports being out of Ozempic  and Dexcom and wanted clarification on the issue. Ozempic  was prescribed on 11/10/2023, but the patient has not taken it because she never received notification from [facility] that it was filled. She also has not used her Dexcom since early September 2025 and mentioned her PCP refilled the medication. I informed her that a prior authorization may be required. Since the patient was unsure, I recommended contacting her pharmacy and insurance carrier to confirm if approval is needed. Patient would like to know if a new prescription should be sent. I advised her to first check with the pharmacy regarding Ozempic  and confirm with insurance for both Ozempic  and Dexcom if a PA is required.

## 2024-03-14 NOTE — Telephone Encounter (Signed)
 Script resent for 10 days

## 2024-03-14 NOTE — Telephone Encounter (Signed)
 Copied from CRM [id number]. Topic: Clinical - Prescription Issue >> Mar 08, 2024  2:59 PM Frederich PARAS wrote: Reason for CRM: DEXCOM TRANSMITERR ANS SENSOR , BOTH THE QUANTITY AND INSTRUCTIONS ARE INCORRECT , THEY NEED TO SPEAK TO SOMEONE IN REGARDS TO CHANGES OR THE CORRECT PRESCRIPTION NEEDS TO BE SENT BACK IN   CALL [phone] [facility] DRE >> Mar 14, 2024 10:00 AM Delon DASEN wrote: Drae with [facility], still need corrected prescription- It needs to read every 10 days, insurance will not cover every 14 days. Dexcom is only good for 10 days.

## 2024-03-15 ENCOUNTER — Ambulatory Visit: Admitting: Internal Medicine

## 2024-03-23 ENCOUNTER — Other Ambulatory Visit: Payer: Self-pay | Admitting: Family Medicine

## 2024-03-23 ENCOUNTER — Other Ambulatory Visit (HOSPITAL_COMMUNITY): Payer: Self-pay

## 2024-03-23 ENCOUNTER — Telehealth: Payer: Self-pay | Admitting: Pharmacy Technician

## 2024-03-23 DIAGNOSIS — E1122 Type 2 diabetes mellitus with diabetic chronic kidney disease: Secondary | ICD-10-CM

## 2024-03-23 MED ORDER — ACCU-CHEK GUIDE W/DEVICE KIT: 1.0000 | PACK | Freq: Every day | 0 refills | Status: AC

## 2024-03-23 NOTE — Telephone Encounter (Signed)
 Pharmacy Patient Advocate Encounter   Received notification from [facility] KEY that prior authorization for Dexcom G6 Sensor is required/requested.   Insurance verification completed.   The patient is insured through [employer].   Patient must have a diagnosis of insulin -dependant diabetes. Her chart does not reflect that she is currently treated with insulin  and therefore, does not meet the coverage criteria. Her insurance will cover an Accu-chek Guide meter, strips, and lancets. Please advise.  CMM Key# [id number]

## 2024-03-23 NOTE — Telephone Encounter (Signed)
 Sent in

## 2024-04-05 ENCOUNTER — Telehealth: Payer: Self-pay | Admitting: Pharmacy Technician

## 2024-04-05 ENCOUNTER — Other Ambulatory Visit (HOSPITAL_COMMUNITY): Payer: Self-pay

## 2024-04-05 NOTE — Telephone Encounter (Signed)
 Clinical questions have been answered and PA submitted. PA currently Pending.

## 2024-04-06 ENCOUNTER — Other Ambulatory Visit (HOSPITAL_COMMUNITY): Payer: Self-pay

## 2024-04-07 ENCOUNTER — Other Ambulatory Visit (HOSPITAL_COMMUNITY): Payer: Self-pay

## 2024-04-07 NOTE — Telephone Encounter (Signed)
 Pharmacy Patient Advocate Encounter  Received notification from [employer] that Prior Authorization for Dexcom G6 Sensor has been APPROVED from 04/05/24 to 10/02/24. Ran test claim, Copay is $0. This test claim was processed through [facility]- copay amounts may vary at other pharmacies due to pharmacy/plan contracts, or as the patient moves through the different stages of their insurance plan.   PA #/Case ID/Reference #: [id number]
